# Patient Record
Sex: Male | Born: 1950 | Race: White | Hispanic: No | Marital: Married | Smoking: Never smoker
Health system: Southern US, Community
[De-identification: ages and names within clinical notes are randomized; demographics above are authoritative.]

## PROBLEM LIST (undated history)

## (undated) DIAGNOSIS — H8190 Unspecified disorder of vestibular function, unspecified ear: Secondary | ICD-10-CM

## (undated) DIAGNOSIS — K219 Gastro-esophageal reflux disease without esophagitis: Secondary | ICD-10-CM

## (undated) DIAGNOSIS — R768 Other specified abnormal immunological findings in serum: Secondary | ICD-10-CM

## (undated) DIAGNOSIS — Z952 Presence of prosthetic heart valve: Secondary | ICD-10-CM

## (undated) DIAGNOSIS — G44009 Cluster headache syndrome, unspecified, not intractable: Secondary | ICD-10-CM

## (undated) DIAGNOSIS — R011 Cardiac murmur, unspecified: Secondary | ICD-10-CM

## (undated) DIAGNOSIS — K760 Fatty (change of) liver, not elsewhere classified: Secondary | ICD-10-CM

## (undated) DIAGNOSIS — R42 Dizziness and giddiness: Secondary | ICD-10-CM

## (undated) DIAGNOSIS — M2550 Pain in unspecified joint: Secondary | ICD-10-CM

## (undated) DIAGNOSIS — D649 Anemia, unspecified: Secondary | ICD-10-CM

## (undated) DIAGNOSIS — E785 Hyperlipidemia, unspecified: Secondary | ICD-10-CM

## (undated) DIAGNOSIS — I2510 Atherosclerotic heart disease of native coronary artery without angina pectoris: Secondary | ICD-10-CM

## (undated) DIAGNOSIS — G459 Transient cerebral ischemic attack, unspecified: Secondary | ICD-10-CM

## (undated) DIAGNOSIS — C439 Malignant melanoma of skin, unspecified: Secondary | ICD-10-CM

## (undated) DIAGNOSIS — I447 Left bundle-branch block, unspecified: Secondary | ICD-10-CM

## (undated) DIAGNOSIS — D229 Melanocytic nevi, unspecified: Secondary | ICD-10-CM

## (undated) DIAGNOSIS — Z7901 Long term (current) use of anticoagulants: Secondary | ICD-10-CM

## (undated) DIAGNOSIS — C4491 Basal cell carcinoma of skin, unspecified: Secondary | ICD-10-CM

## (undated) DIAGNOSIS — I639 Cerebral infarction, unspecified: Secondary | ICD-10-CM

## (undated) DIAGNOSIS — M1990 Unspecified osteoarthritis, unspecified site: Secondary | ICD-10-CM

## (undated) DIAGNOSIS — I6523 Occlusion and stenosis of bilateral carotid arteries: Secondary | ICD-10-CM

## (undated) DIAGNOSIS — Q211 Atrial septal defect: Secondary | ICD-10-CM

## (undated) DIAGNOSIS — K5790 Diverticulosis of intestine, part unspecified, without perforation or abscess without bleeding: Secondary | ICD-10-CM

## (undated) DIAGNOSIS — M109 Gout, unspecified: Secondary | ICD-10-CM

## (undated) HISTORY — DX: Diverticulosis of intestine, part unspecified, without perforation or abscess without bleeding: K57.90

## (undated) HISTORY — DX: Presence of prosthetic heart valve: Z95.2

## (undated) HISTORY — DX: Gout, unspecified: M10.9

## (undated) HISTORY — DX: Melanocytic nevi, unspecified: D22.9

## (undated) HISTORY — PX: CARDIAC VALVE REPLACEMENT: SHX585

## (undated) HISTORY — DX: Gastro-esophageal reflux disease without esophagitis: K21.9

## (undated) HISTORY — DX: Hyperlipidemia, unspecified: E78.5

## (undated) HISTORY — PX: MELANOMA EXCISION: SHX5266

## (undated) HISTORY — PX: DENTAL SURGERY: SHX609

## (undated) HISTORY — DX: Pain in unspecified joint: M25.50

## (undated) HISTORY — PX: APPENDECTOMY: SHX54

## (undated) HISTORY — DX: Transient cerebral ischemic attack, unspecified: G45.9

## (undated) HISTORY — PX: CARDIAC CATHETERIZATION: SHX172

## (undated) HISTORY — DX: Other specified abnormal immunological findings in serum: R76.8

## (undated) HISTORY — DX: Atherosclerotic heart disease of native coronary artery without angina pectoris: I25.10

## (undated) HISTORY — DX: Cluster headache syndrome, unspecified, not intractable: G44.009

## (undated) HISTORY — DX: Dizziness and giddiness: R42

## (undated) HISTORY — DX: Occlusion and stenosis of bilateral carotid arteries: I65.23

## (undated) HISTORY — DX: Basal cell carcinoma of skin, unspecified: C44.91

## (undated) HISTORY — DX: Unspecified disorder of vestibular function, unspecified ear: H81.90

## (undated) HISTORY — DX: Atrial septal defect: Q21.1

## (undated) HISTORY — DX: Long term (current) use of anticoagulants: Z79.01

## (undated) HISTORY — DX: Cerebral infarction, unspecified: I63.9

## (undated) HISTORY — DX: Fatty (change of) liver, not elsewhere classified: K76.0

## (undated) HISTORY — DX: Malignant melanoma of skin, unspecified: C43.9

## (undated) HISTORY — PX: CHOLECYSTECTOMY: SHX55

## (undated) HISTORY — DX: Left bundle-branch block, unspecified: I44.7

---

## 2010-10-13 ENCOUNTER — Other Ambulatory Visit: Payer: Self-pay | Admitting: Family Medicine

## 2010-10-13 DIAGNOSIS — H539 Unspecified visual disturbance: Secondary | ICD-10-CM

## 2010-10-14 ENCOUNTER — Ambulatory Visit
Admission: RE | Admit: 2010-10-14 | Discharge: 2010-10-14 | Disposition: A | Discharge status: Home / Self Care | Payer: BC Managed Care – PPO | Source: Ambulatory Visit | Attending: Family Medicine | Admitting: Family Medicine

## 2010-10-14 DIAGNOSIS — H539 Unspecified visual disturbance: Secondary | ICD-10-CM

## 2010-10-14 MED ORDER — GADOBENATE DIMEGLUMINE 529 MG/ML IV SOLN
20.0000 mL | Freq: Once | INTRAVENOUS | Status: AC | PRN
  Administered 2010-10-14: 20 mL via INTRAVENOUS

## 2011-02-09 ENCOUNTER — Encounter (INDEPENDENT_AMBULATORY_CARE_PROVIDER_SITE_OTHER): Payer: Self-pay

## 2011-02-10 ENCOUNTER — Encounter (INDEPENDENT_AMBULATORY_CARE_PROVIDER_SITE_OTHER): Payer: Self-pay | Admitting: General Surgery

## 2011-02-10 ENCOUNTER — Ambulatory Visit (INDEPENDENT_AMBULATORY_CARE_PROVIDER_SITE_OTHER): Payer: BC Managed Care – PPO | Admitting: General Surgery

## 2011-02-10 DIAGNOSIS — C4370 Malignant melanoma of unspecified lower limb, including hip: Secondary | ICD-10-CM

## 2011-02-10 DIAGNOSIS — D0371 Melanoma in situ of right lower limb, including hip: Secondary | ICD-10-CM | POA: Insufficient documentation

## 2011-02-10 NOTE — Progress Notes (Signed)
Chief Complaint  Patient presents with  . Other    new pt- eval level 4 melanoma on rt thigh    HPI Tracy Marshall. is a 60 y.o. male.    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 The patient had this removed approximately one week ago. The lesion is from the right anterior lateral thigh with the diagnosis being malignant melanoma at least Clark's level IV Breslow measurement a 2 mm.  The patient at the time of this a biopsy also had 3 other lesions removed from his left anterior abdominal wall is right scapular area in his right medial thigh all of these are dysplastic nevi with a melanotic atypia. He has had previous melanoma removed from back in 2002 from his stomach wall at the time of a previous cholecystectomy. Also from his left shoulder area about one year ago. This was done while he was seen in the kidney to his mother also has a history of melanoma and recently diagnosed ovarian cancer.  The patient comes in now for evaluation for surgical treatment of melanoma on the right side  Past Medical History  Diagnosis Date  . Melanoma   . Hyperlipidemia   . TIA (transient ischemic attack)   . Atypical moles   . Dizziness   . Cluster headaches   . Joint pain     Past Surgical History  Procedure Date  . Cardiac valve replacement   . Cholecystectomy   . Appendectomy   . Melanoma excision     x3    Family History  Problem Relation Age of Onset  . Melanoma    . Psoriasis    . Cancer Mother     melanoma, ovarian  . Cancer Father     prostate    Social History History  Substance Use Topics  . Smoking status: Never Smoker   . Smokeless tobacco: Not on file  . Alcohol Use: Yes    No Known Allergies  Current Outpatient Prescriptions  Medication Sig Dispense Refill  . Multiple Vitamin (MULTIVITAMIN) capsule Take 1 capsule by mouth daily.        . pantoprazole (PROTONIX) 40 MG tablet daily.      [name] warfarin (COUMADIN) 5 MG  tablet daily.        Review of Systems Review of Systems  Constitutional: Negative.   HENT: Negative.   Respiratory: Negative.   Cardiovascular: Negative.   Skin: Negative.     There were no vitals taken for this visit.  Physical Exam Physical Exam  Constitutional: He appears well-developed and well-nourished.  HENT:  Head: Normocephalic and atraumatic.  Eyes: Pupils are equal, round, and reactive to light.  Cardiovascular: Normal rate, regular rhythm, S1 normal and S2 normal.        Mechanical valve click  Respiratory: Effort normal and breath sounds normal.  Skin: Skin is warm and dry. Lesion (Right anterolateral burn mark from excised melanoma) noted.     Psychiatric: He has a normal mood and affect. His behavior is normal. Judgment and thought content normal.     Data Reviewed I have reviewed the pathology report from the dermatology specialists. She demonstrates a malignant melanoma of the right anterior lateral thigh 0.82 mm in depth Clark's level IV  Assessment    Malignant melanoma Clark's level 422 mm in depth is right at the borderline of requiring a sentinel lymph node biopsy.    Plan  The patient is to have cardiovascular followup and clearance for surgery. 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  After cardiology clearance we will need to go ahead and schedule him for wide excision of the previously excised melanoma site along with a sentinel lymph node biopsy so he'll need to have radionuclide mapping along with the dye mapping and at the time of surgery to       Tracy Marshall III,Tracy Marshall O 02/10/2011, 11:35 AM

## 2011-02-10 NOTE — Patient Instructions (Signed)
Will discuss surgery again at next visit along with plans for anticoagulation coverage.

## 2011-02-19 ENCOUNTER — Ambulatory Visit (INDEPENDENT_AMBULATORY_CARE_PROVIDER_SITE_OTHER): Payer: BC Managed Care – PPO | Admitting: General Surgery

## 2011-02-19 DIAGNOSIS — C4370 Malignant melanoma of unspecified lower limb, including hip: Secondary | ICD-10-CM

## 2011-02-19 NOTE — Progress Notes (Signed)
HPI The patient comes in after having a cardiac assessment preoperatively for surgery. A stress test was performed which he passed well plans was Coumadin therapy is to stop and to bridge him with Lovenox.  The patient has a second lesion on the lateral aspect of his right thigh similar to the one that was biopsied demonstrating melanoma that he has identified  PE Patient has a 1 cm previously excised melanoma site in the anterolateral aspect of the of the upper right thigh. Slightly superior and lateral to the excised lesion is a 1 cm firm lightly pigmented lesion similar to the one that was previously excised.  Studiy review The patient had a recent office visit evaluation by Cristopher Peru nurse practitioner for preoperative evaluation for cardiac stability. A pharmacologic stress test has been done which apparently the patient is passed without problem. Plans are to bridge this Coumadin.  Assessment Previously excised melanoma of the anterolateral right thigh. A second pigmented lesion in the superior lateral to the prior lesion and will be excised at the same time as surgery.  Plan Plan for surgery as described above.

## 2011-02-20 ENCOUNTER — Other Ambulatory Visit (INDEPENDENT_AMBULATORY_CARE_PROVIDER_SITE_OTHER): Payer: Self-pay | Admitting: General Surgery

## 2011-02-20 DIAGNOSIS — C439 Malignant melanoma of skin, unspecified: Secondary | ICD-10-CM

## 2011-03-18 ENCOUNTER — Other Ambulatory Visit (HOSPITAL_COMMUNITY): Payer: BC Managed Care – PPO

## 2011-03-23 ENCOUNTER — Other Ambulatory Visit (HOSPITAL_COMMUNITY): Payer: BC Managed Care – PPO

## 2011-03-26 ENCOUNTER — Other Ambulatory Visit (INDEPENDENT_AMBULATORY_CARE_PROVIDER_SITE_OTHER): Payer: Self-pay | Admitting: General Surgery

## 2011-03-26 ENCOUNTER — Encounter (HOSPITAL_COMMUNITY)
Admission: RE | Admit: 2011-03-26 | Discharge: 2011-03-26 | Disposition: A | Discharge status: Home / Self Care | Payer: BC Managed Care – PPO | Source: Ambulatory Visit | Attending: General Surgery | Admitting: General Surgery

## 2011-03-26 DIAGNOSIS — C4370 Malignant melanoma of unspecified lower limb, including hip: Secondary | ICD-10-CM

## 2011-03-26 LAB — CBC
MCV: 86.3 fL (ref 78.0–100.0)
Platelets: 314 10*3/uL (ref 150–400)
RDW: 12.9 % (ref 11.5–15.5)
WBC: 6.5 10*3/uL (ref 4.0–10.5)

## 2011-03-26 LAB — COMPREHENSIVE METABOLIC PANEL
CO2: 27 mEq/L (ref 19–32)
Calcium: 9.8 mg/dL (ref 8.4–10.5)
Creatinine, Ser: 0.97 mg/dL (ref 0.50–1.35)
GFR calc Af Amer: >90 mL/min (ref >90)
GFR calc non Af Amer: 89 mL/min — ABNORMAL LOW (ref >90)
Glucose, Bld: 109 mg/dL — ABNORMAL HIGH (ref 70–99)

## 2011-03-26 LAB — DIFFERENTIAL
Eosinophils Absolute: 0.2 10*3/uL (ref 0.0–0.7)
Eosinophils Relative: 2 % (ref 0–5)
Lymphs Abs: 2.5 10*3/uL (ref 0.7–4.0)

## 2011-03-26 LAB — SURGICAL PCR SCREEN
MRSA, PCR: NEGATIVE
Staphylococcus aureus: NEGATIVE

## 2011-03-26 LAB — APTT: aPTT: 33 seconds (ref 24–37)

## 2011-04-02 ENCOUNTER — Ambulatory Visit (HOSPITAL_COMMUNITY)
Admission: RE | Admit: 2011-04-02 | Discharge: 2011-04-02 | Disposition: A | Discharge status: Home / Self Care | Payer: BC Managed Care – PPO | Source: Ambulatory Visit | Attending: General Surgery | Admitting: General Surgery

## 2011-04-02 ENCOUNTER — Other Ambulatory Visit (INDEPENDENT_AMBULATORY_CARE_PROVIDER_SITE_OTHER): Payer: Self-pay | Admitting: General Surgery

## 2011-04-02 ENCOUNTER — Ambulatory Visit (HOSPITAL_COMMUNITY)
Admission: RE | Admit: 2011-04-02 | Discharge: 2011-04-03 | Disposition: A | Discharge status: Home / Self Care | Payer: BC Managed Care – PPO | Source: Ambulatory Visit | Attending: General Surgery | Admitting: General Surgery

## 2011-04-02 DIAGNOSIS — C439 Malignant melanoma of skin, unspecified: Secondary | ICD-10-CM

## 2011-04-02 DIAGNOSIS — C4370 Malignant melanoma of unspecified lower limb, including hip: Secondary | ICD-10-CM

## 2011-04-02 DIAGNOSIS — Z01812 Encounter for preprocedural laboratory examination: Secondary | ICD-10-CM | POA: Insufficient documentation

## 2011-04-02 DIAGNOSIS — D239 Other benign neoplasm of skin, unspecified: Secondary | ICD-10-CM

## 2011-04-02 DIAGNOSIS — Z01818 Encounter for other preprocedural examination: Secondary | ICD-10-CM | POA: Insufficient documentation

## 2011-04-02 DIAGNOSIS — Z954 Presence of other heart-valve replacement: Secondary | ICD-10-CM | POA: Insufficient documentation

## 2011-04-02 DIAGNOSIS — D2370 Other benign neoplasm of skin of unspecified lower limb, including hip: Secondary | ICD-10-CM | POA: Insufficient documentation

## 2011-04-02 DIAGNOSIS — Z8673 Personal history of transient ischemic attack (TIA), and cerebral infarction without residual deficits: Secondary | ICD-10-CM | POA: Insufficient documentation

## 2011-04-02 LAB — PROTIME-INR: Prothrombin Time: 15.3 seconds — ABNORMAL HIGH (ref 11.6–15.2)

## 2011-04-02 MED ORDER — TECHNETIUM TC 99M SULFUR COLLOID FILTERED
0.5000 | Freq: Once | INTRAVENOUS | Status: AC | PRN
  Administered 2011-04-02: 0.5 via INTRADERMAL

## 2011-04-07 ENCOUNTER — Telehealth (INDEPENDENT_AMBULATORY_CARE_PROVIDER_SITE_OTHER): Payer: Self-pay

## 2011-04-07 NOTE — Telephone Encounter (Signed)
Patient called for his path results.  I notified him that the results were negative margins, no residual melanoma, and negative lymphnode.  He asked for any further instructions.  I told him that Marcelino Duster would talk to Dr Lindie Spruce Thursday and if there is anything else he needs to know they will call.

## 2011-04-07 NOTE — Op Note (Signed)
Tracy Marshall, GUMZ NO.:  [id number]  MEDICAL RECORD NO.:  [id number]  LOCATION:  [facility]                         FACILITY:  [facility]  PHYSICIAN:  Cherylynn Ridges, M.D.    DATE OF BIRTH:  1950/07/09  DATE OF PROCEDURE:  04/02/2011 DATE OF DISCHARGE:                              OPERATIVE REPORT   PREOPERATIVE DIAGNOSIS:  Previously excised melanoma of the right upper lateral thigh with a second posterior and lateral pigmented lesion on the right thigh.  POSTOP DIAGNOSIS:  Previously excised melanoma of the right upper lateral thigh with a second posterior and lateral pigmented lesion on the right thigh.  PROCEDURE: 1. Re-excision of previously excised melanoma site. 2. Excision of pigmented lesion of the upper lateral and posterior     thigh. 3. Sentinel lymph node biopsy with methylene blue injection and     localization with using a Neoprobe.  SURGEON:  Cherylynn Ridges, MD  ANESTHESIA:  General with laryngeal airway.  ESTIMATED BLOOD LOSS:  Less than 20 mL.  COMPLICATIONS:  None.  CONDITION:  Stable.  FINDINGS:  The patient had 1 pigmented and hot sentinel lymph node which was removed.  INDICATION FOR OPERATION:  The patient had a previously excised Clarks level IV melanoma which is now being followed up with re-excision and sentinel lymph node biopsy.  OPERATION:  The patient was taken to the operating room, placed on table in supine position.  After an adequate general laryngeal airway anesthetic was administered, he was prepped and draped in usual sterile manner exposing his right thigh.  We marked the area of excision of the more anterior but lateral upper right thigh lesion.  It had been previously injected with radionuclide. We injected it with 5 mL of methylene blue.  We then marked the margins 2 cm sort of superolaterally and inferolaterally and giving [place] at least 1 cm margin circumferentially.  We used a 15 blade to make the incision, and  subsequently excised using electrocautery down through and deep into the subcutaneous tissue.  We obtained hemostasis in that excised area using electrocautery.  We then excised the pigmented lesion with at least 1 cm margin circumferentially using a 15 blade and electrocautery.  Both these were left opened and subsequently injected with Exparel  for postop pain control.  A total of 27 mL were use of the diluted solution up to 30 mL. We closed all incisions sequentially at the end.  However, after we excised these 2 areas, we went ahead and used the Neoprobe in order to localize the sentinel lymph node in the right groin.  A single area of high activity was noted.  We made an incision approximately 3 cm on top of that and then dissected down to the subcutaneous tissue where a pigmented from the methylene blue and hot radionucleotide Neoprobe node was noted. We used small hemoclips in order to help excise this and subsequently sent off the field.  It was sent for permanent sections. We closed all layers with a combination of 3-0 or 4-0 Vicryl deep subcutaneous layer and a 3-0 or 4-0 Monocryl in the skin.  Exparel was injected in all sites, a  total of 27 mL were used.  We then used Dermabond, Tegaderm and Steri-Strips to complete the dressing.  All counts were correct.     Cherylynn Ridges, M.D.     JOW/MEDQ  D:  04/02/2011  T:  04/02/2011  Job:  [id number]  Electronically Signed by Jimmye Norman M.D. on 04/07/2011 07:31:02 AM

## 2011-04-08 ENCOUNTER — Telehealth (INDEPENDENT_AMBULATORY_CARE_PROVIDER_SITE_OTHER): Payer: Self-pay

## 2011-04-08 NOTE — Telephone Encounter (Signed)
I notified the pt that Dr Lindie Spruce says he can restart the Coumadin and stop Lovenox.  He will coordinate this with his medical md.

## 2011-04-14 ENCOUNTER — Telehealth (INDEPENDENT_AMBULATORY_CARE_PROVIDER_SITE_OTHER): Payer: Self-pay

## 2011-04-14 NOTE — Telephone Encounter (Signed)
Pt called stating small amount of watery blood drainage from thigh wd. No redness. No fever. Not hot to touch. Wound closed without opening. Pt to cover with dry dsg. Pt advised this may be small seroma under wd and to keep area clean and covered with dsg. Pt to call for sooner ov if area becomes red,feverish,swollen or if drainage appears infected. Pt to keep appt 11-6.

## 2011-04-28 ENCOUNTER — Encounter (INDEPENDENT_AMBULATORY_CARE_PROVIDER_SITE_OTHER): Payer: Self-pay | Admitting: General Surgery

## 2011-04-28 ENCOUNTER — Ambulatory Visit (INDEPENDENT_AMBULATORY_CARE_PROVIDER_SITE_OTHER): Payer: BC Managed Care – PPO | Admitting: General Surgery

## 2011-04-28 VITALS — BP 124/86 | HR 64 | Temp 97.6°F | Resp 12 | Ht 70.0 in | Wt 195.6 lb

## 2011-04-28 DIAGNOSIS — Z09 Encounter for follow-up examination after completed treatment for conditions other than malignant neoplasm: Secondary | ICD-10-CM

## 2011-04-28 NOTE — Progress Notes (Signed)
HPI The patient is doing well status post reexcision of melanoma site in the right upper lateral 5 and a second lesion being removed from his thigh on the right side. He also had a sentinel lymph node biopsy x2 from his right inguinal area both of which were negative for melanoma.  PE On examination at the primary excision site the patient has some significant induration and some brawny skin changes which I would like to see again in a month to make sure that it is improving.  At the secondary lesion excision site the patient has had some serous leakage but currently it does not look infected and does not require any further treatment.  Studiy review I have read reviewed the patient's pathology report showed negative margins and also negative disease in the lymph node  Assessment Edema and some brawny changes in the primary excision site no evidence of lymphedema in the thigh.  Plan I will see the patient again in one month to reassess the changes at the primary excision site but otherwise I think he is doing well and no plans for antibiotics or other treatments are needed at this time.

## 2011-06-09 ENCOUNTER — Ambulatory Visit (INDEPENDENT_AMBULATORY_CARE_PROVIDER_SITE_OTHER): Payer: BC Managed Care – PPO | Admitting: General Surgery

## 2011-06-09 ENCOUNTER — Encounter (INDEPENDENT_AMBULATORY_CARE_PROVIDER_SITE_OTHER): Payer: Self-pay | Admitting: General Surgery

## 2011-06-09 VITALS — BP 134/96 | HR 64 | Temp 97.9°F | Resp 16 | Ht 70.0 in | Wt 204.4 lb

## 2011-06-09 DIAGNOSIS — Z09 Encounter for follow-up examination after completed treatment for conditions other than malignant neoplasm: Secondary | ICD-10-CM

## 2011-06-09 NOTE — Progress Notes (Signed)
HPI The patient is status post wide excision of this melanoma with a left inguinal lymph node biopsy. He is doing much better. He is still complaining of some medial thigh discomfort but nothing severe and it does appear to be improving.  PE  His wounds are without evidence of infection.  Studiy review None  Assessment Doing well postoperatively  Plan Return to see me on a p.r.n. basis

## 2011-08-11 ENCOUNTER — Other Ambulatory Visit: Payer: Self-pay | Admitting: Dermatology

## 2013-06-01 ENCOUNTER — Encounter: Payer: Self-pay | Admitting: General Surgery

## 2013-06-01 DIAGNOSIS — R0789 Other chest pain: Secondary | ICD-10-CM

## 2013-06-01 DIAGNOSIS — I359 Nonrheumatic aortic valve disorder, unspecified: Secondary | ICD-10-CM | POA: Insufficient documentation

## 2013-06-01 DIAGNOSIS — Z7901 Long term (current) use of anticoagulants: Secondary | ICD-10-CM

## 2013-06-02 ENCOUNTER — Encounter: Payer: Self-pay | Admitting: Cardiology

## 2013-06-02 ENCOUNTER — Ambulatory Visit (INDEPENDENT_AMBULATORY_CARE_PROVIDER_SITE_OTHER): Payer: 59 | Admitting: Cardiology

## 2013-06-02 VITALS — BP 130/91 | HR 80 | Ht 69.5 in | Wt 202.0 lb

## 2013-06-02 DIAGNOSIS — Z954 Presence of other heart-valve replacement: Secondary | ICD-10-CM

## 2013-06-02 DIAGNOSIS — I359 Nonrheumatic aortic valve disorder, unspecified: Secondary | ICD-10-CM

## 2013-06-02 DIAGNOSIS — Z7901 Long term (current) use of anticoagulants: Secondary | ICD-10-CM

## 2013-06-02 DIAGNOSIS — Z952 Presence of prosthetic heart valve: Secondary | ICD-10-CM

## 2013-06-02 DIAGNOSIS — I447 Left bundle-branch block, unspecified: Secondary | ICD-10-CM | POA: Insufficient documentation

## 2013-06-02 NOTE — Progress Notes (Signed)
[address] [place], [place]  [address] Phone: [phone] Fax:  [phone]  Date:  06/02/2013   ID:  Tracy App., DOB 02-19-1951, MRN [id number]  PCP:  Tracy Rua, MD  Cardiologist:  Armanda Magic, MD     History of Present Illness: Tracy Marshall. is a 62 y.o. male with a history of mechanical AVR remotely in 2001 who saw me in April 2014 with complaints of sharp CP.  He was under a lot of stress with his wife having breast CA.  A nuclear stress test was done which was low risk and showed a small apical defect.  He was continued on medical therapy and presents back for followup today.  He is doing well.  He has not had any further chest pain or SOB.  He denies any palpitations, LE edema, dizziness or syncope.   Wt Readings from Last 3 Encounters:  06/01/13 199 lb 12.8 oz (90.629 kg)  06/09/11 204 lb 6 oz (92.704 kg)  04/28/11 195 lb 9.6 oz (88.724 kg)     Past Medical History  Diagnosis Date  . Hyperlipidemia   . TIA (transient ischemic attack)   . Atypical moles     melanomna  . Dizziness   . Cluster headaches   . Joint pain   . Gout   . Melanoma     hx of melanoma and multiple basal cell carcinomas Dr Danella Deis  . GERD (gastroesophageal reflux disease)   . Diverticulosis   . Fatty liver     hx of elevated hepatic transaminases-negative workup in 2009 except for U/S suggesting fatty liver  . Episodic recurrent vertigo 2002 & 2005    carotid dopplers w no clinically significant stenosis  . Positive ANA (antinuclear antibody)   . Basal cell carcinoma   . LBBB (left bundle branch block)   . S/P AVR (aortic valve replacement)     mechanical  . Chronic anticoagulation     for mechanical AVR    Current Outpatient Prescriptions  Medication Sig Dispense Refill  . colchicine 0.6 MG tablet Take 0.6 mg by mouth as needed.      . meclizine (ANTIVERT) 25 MG tablet Take 25 mg by mouth as needed for dizziness.      . Multiple Vitamin (MULTIVITAMIN)  capsule Take 1 capsule by mouth daily.        . pantoprazole (PROTONIX) 40 MG tablet daily.      [name] warfarin (COUMADIN) 5 MG tablet daily.       No current facility-administered medications for this visit.    Allergies:   No Known Allergies  Social History:  The patient  reports that he has never smoked. He has never used smokeless tobacco. He reports that he drinks alcohol. He reports that he does not use illicit drugs.   Family History:  The patient's family history includes Cancer in his father and mother; Melanoma in an other family member; Psoriasis in an other family member.   ROS:  Please see the history of present illness.      All other systems reviewed and negative.   PHYSICAL EXAM: VS:  There were no vitals taken for this visit. Well nourished, well developed, in no acute distress HEENT: normal Neck: no JVD Cardiac:  normal S1, S2; RRR; no murmur Lungs:  clear to auscultation bilaterally, no wheezing, rhonchi or rales Abd: soft, nontender, no hepatomegaly Ext: no edema Skin: warm and dry Neuro:  CNs 2-12 intact,  no focal abnormalities noted  EKG:       ASSESSMENT AND PLAN:  1. Mechanical AVR 2. Chronic systemic anticoagulation  - followed by his PCP 3.  Atypical CP resolved with low risk nuclear stress test 4.  Chronic LBBB  Followup with me in 1 year  Signed, Armanda Magic, MD 06/02/2013 11:15 AM

## 2013-06-02 NOTE — Patient Instructions (Signed)
Your physician recommends that you continue on your current medications as directed. Please refer to the Current Medication list given to you today.  Your physician wants you to follow-up in: 1 Year with Dr Turner You will receive a reminder letter in the mail two months in advance. If you don't receive a letter, please call our office to schedule the follow-up appointment.  

## 2014-03-29 ENCOUNTER — Encounter: Payer: Self-pay | Admitting: Cardiology

## 2014-06-05 ENCOUNTER — Ambulatory Visit (INDEPENDENT_AMBULATORY_CARE_PROVIDER_SITE_OTHER): Payer: 59 | Admitting: Cardiology

## 2014-06-05 ENCOUNTER — Encounter: Payer: Self-pay | Admitting: Cardiology

## 2014-06-05 VITALS — BP 122/84 | HR 58 | Ht 70.0 in | Wt 199.0 lb

## 2014-06-05 DIAGNOSIS — Z952 Presence of prosthetic heart valve: Secondary | ICD-10-CM

## 2014-06-05 DIAGNOSIS — I359 Nonrheumatic aortic valve disorder, unspecified: Secondary | ICD-10-CM

## 2014-06-05 DIAGNOSIS — R29810 Facial weakness: Secondary | ICD-10-CM

## 2014-06-05 DIAGNOSIS — I447 Left bundle-branch block, unspecified: Secondary | ICD-10-CM

## 2014-06-05 DIAGNOSIS — Z954 Presence of other heart-valve replacement: Secondary | ICD-10-CM

## 2014-06-05 DIAGNOSIS — R0789 Other chest pain: Secondary | ICD-10-CM

## 2014-06-05 NOTE — Patient Instructions (Addendum)
Your physician has requested that you have an echocardiogram. Echocardiography is a painless test that uses sound waves to create images of your heart. It provides your doctor with information about the size and shape of your heart and how well your heart's chambers and valves are working. This procedure takes approximately one hour. There are no restrictions for this procedure.  You have an appointment with a doctor at your primary care TODAY.  Your physician wants you to follow-up in: one year with Dr. Radford Pax. You will receive a reminder letter in the mail two months in advance. If you don't receive a letter, please call our office to schedule the follow-up appointment.

## 2014-06-05 NOTE — Progress Notes (Signed)
[place] [place], [place]  [address] Phone: [phone] Fax:  [phone]  Date:  06/05/2014   ID:  Tracy Marshall., DOB 1950-12-31, MRN [id number]  PCP:  Orpah Melter, MD  Cardiologist:  Fransico Him, MD    History of Present Illness: Tracy Marshall. is a 63 y.o. male with a history of mechanical AVR remotely in 2001 who saw me in April 2014 with complaints of sharp CP. He was under a lot of stress with his wife having breast CA. A nuclear stress test was done which was low risk and showed a small apical defect. He was continued on medical therapy and presents back for followup today. He is doing well. He has not had any further chest pain or SOB except some mild DOE on occasion with exertion but it does not limit his activity.  He denies any palpitations, LE edema, dizziness or syncope.   Wt Readings from Last 3 Encounters:  06/05/14 199 lb (90.266 kg)  06/02/13 202 lb (91.627 kg)  06/01/13 199 lb 12.8 oz (90.629 kg)     Past Medical History  Diagnosis Date  . Hyperlipidemia   . TIA (transient ischemic attack)   . Atypical moles     melanomna  . Dizziness   . Cluster headaches   . Joint pain   . Gout   . Melanoma     hx of melanoma and multiple basal cell carcinomas Dr Tonia Brooms  . GERD (gastroesophageal reflux disease)   . Diverticulosis   . Fatty liver     hx of elevated hepatic transaminases-negative workup in 2009 except for U/S suggesting fatty liver  . Episodic recurrent vertigo 2002 & 2005    carotid dopplers w no clinically significant stenosis  . Positive ANA (antinuclear antibody)   . Basal cell carcinoma   . LBBB (left bundle branch block)   . S/P AVR (aortic valve replacement)     mechanical  . Chronic anticoagulation     for mechanical AVR    Current Outpatient Prescriptions  Medication Sig Dispense Refill  . amoxicillin (AMOXIL) 875 MG tablet Take 875 mg by mouth 2 (two) times daily.   0  . colchicine 0.6 MG tablet Take  0.6 mg by mouth as needed.     . meclizine (ANTIVERT) 25 MG tablet Take 25 mg by mouth as needed for dizziness.    . Multiple Vitamin (MULTIVITAMIN) capsule Take 1 capsule by mouth daily.      . pantoprazole (PROTONIX) 40 MG tablet daily.    [name] warfarin (COUMADIN) 5 MG tablet daily.     No current facility-administered medications for this visit.    Allergies:   No Known Allergies  Social History:  The patient  reports that he has never smoked. He has never used smokeless tobacco. He reports that he drinks alcohol. He reports that he does not use illicit drugs.   Family History:  The patient's family history includes Cancer in his father and mother; Melanoma in an other family member; Psoriasis in an other family member.   ROS:  Please see the history of present illness.      All other systems reviewed and negative.   PHYSICAL EXAM: VS:  BP 122/84 mmHg  Pulse 58  Ht 5\' 10"  (1.778 m)  Wt 199 lb (90.266 kg)  BMI 28.55 kg/m2  SpO2 98% Well nourished, well developed, in no acute distress HEENT: normal Neck: no JVD Cardiac:  normal S1,  S2; RRR; 2/6 Sm at RUSB Lungs:  clear to auscultation bilaterally, no wheezing, rhonchi or rales Abd: soft, nontender, no hepatomegaly Ext: no edema Skin: warm and dry Neuro:  Facial droop on left side noted.  No other neurologic deficits     ASSESSMENT AND PLAN:  1.  Mechanical AVR with mild systolic murmur - I will check a 2D echo 2.  Chronic systemic anticoagulation - followed by his PCP 3. Atypical CP resolved with low risk nuclear stress test 4. Chronic LBBB 5.  Drooping of left side of face with no neurological symptoms.  He has has a sinus infection and is on antibiotics - ? Bell's palsy - I will get him in to see his PCP today  Followup with me in 1 year  Signed, Fransico Him, MD [facility] 06/05/2014 2:18 PM

## 2014-06-08 ENCOUNTER — Ambulatory Visit (HOSPITAL_COMMUNITY): Payer: 59 | Attending: Cardiology | Admitting: Cardiology

## 2014-06-08 DIAGNOSIS — R011 Cardiac murmur, unspecified: Secondary | ICD-10-CM | POA: Insufficient documentation

## 2014-06-08 DIAGNOSIS — Z954 Presence of other heart-valve replacement: Secondary | ICD-10-CM

## 2014-06-08 DIAGNOSIS — I359 Nonrheumatic aortic valve disorder, unspecified: Secondary | ICD-10-CM

## 2014-06-08 NOTE — Progress Notes (Signed)
Echo performed. 

## 2014-06-13 ENCOUNTER — Telehealth: Payer: Self-pay

## 2014-06-13 DIAGNOSIS — I519 Heart disease, unspecified: Secondary | ICD-10-CM

## 2014-06-13 NOTE — Telephone Encounter (Signed)
Patient informed of results and verbal understanding expressed.  Patient agrees to MUGA scan. Ordered for scheduling.

## 2014-06-20 ENCOUNTER — Ambulatory Visit (HOSPITAL_COMMUNITY): Payer: 59 | Attending: Cardiology | Admitting: Radiology

## 2014-06-20 DIAGNOSIS — I519 Heart disease, unspecified: Secondary | ICD-10-CM

## 2014-06-20 DIAGNOSIS — I5181 Takotsubo syndrome: Secondary | ICD-10-CM | POA: Insufficient documentation

## 2014-06-20 NOTE — Progress Notes (Signed)
Muga Study  Referring Provider:  Sueanne Margarita, MD  Date of Procedure: 06/20/2014  Indication: Assess LVF 06-18-2014 Echo: EF 45-50% History of AVR, LBBB, and 2014 Myocardial Perfusion Imaging-small Scar  IV 22G (R) hand x 1, tolerated well by Irven Baltimore, RN.   Muga Information:  The patient's red blood cells were labeled using the Ultra Tag method with 33.0 mci of Technetium 65m Pertechnetate.  The images were reconstructed in the Anterior, Lateral and Left Anterior oblique views.  Impression: Overall EF calculated at 45% with ED volume 116cc and ES volume 64cc.    Signed: Fransico Him, MD [facility] 06/21/2014

## 2014-06-21 MED ORDER — TECHNETIUM TC 99M-LABELED RED BLOOD CELLS IV KIT
33.0000 | PACK | Freq: Once | INTRAVENOUS | Status: AC | PRN
  Administered 2014-06-20: 33 via INTRAVENOUS

## 2014-06-25 ENCOUNTER — Encounter: Payer: Self-pay | Admitting: Cardiology

## 2014-07-18 ENCOUNTER — Other Ambulatory Visit (HOSPITAL_BASED_OUTPATIENT_CLINIC_OR_DEPARTMENT_OTHER): Payer: Self-pay | Admitting: Family Medicine

## 2014-07-18 DIAGNOSIS — G510 Bell's palsy: Secondary | ICD-10-CM

## 2014-07-21 ENCOUNTER — Ambulatory Visit (HOSPITAL_BASED_OUTPATIENT_CLINIC_OR_DEPARTMENT_OTHER)
Admission: RE | Admit: 2014-07-21 | Discharge: 2014-07-21 | Disposition: A | Discharge status: Home / Self Care | Payer: 59 | Source: Ambulatory Visit | Attending: Family Medicine | Admitting: Family Medicine

## 2014-07-21 DIAGNOSIS — R51 Headache: Secondary | ICD-10-CM | POA: Diagnosis present

## 2014-07-21 DIAGNOSIS — I638 Other cerebral infarction: Secondary | ICD-10-CM | POA: Diagnosis not present

## 2014-07-21 DIAGNOSIS — R531 Weakness: Secondary | ICD-10-CM | POA: Insufficient documentation

## 2014-07-21 DIAGNOSIS — R29810 Facial weakness: Secondary | ICD-10-CM | POA: Insufficient documentation

## 2014-07-21 DIAGNOSIS — G510 Bell's palsy: Secondary | ICD-10-CM

## 2014-07-21 MED ORDER — GADOBENATE DIMEGLUMINE 529 MG/ML IV SOLN: 18.0000 mL | Freq: Once | INTRAVENOUS | Status: AC | PRN

## 2014-07-23 ENCOUNTER — Telehealth: Payer: Self-pay | Admitting: Cardiology

## 2014-07-23 NOTE — Telephone Encounter (Signed)
Spoke with Dr. Delman Kitten nurse and informed her that Dr. Radford Pax had to leave for an emergency, but asked if there was anything I could do for help. Dr. Delman Kitten nurse took a message and said that if it is urgent she will have Dr. Maceo Pro call our DOD, Dr. Angelena Form.

## 2014-07-23 NOTE — Telephone Encounter (Signed)
Traci, I got a call today from Dr. Maceo Pro about [name]. (I am the office DOD). He continues to have neurological issues. 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 He has been therapeutic on coumadin for his mechanical valve. I think he will probably need carotids and possibly a TEE. I put him into an opening on your schedule tomorrow. Gerald Stabs

## 2014-07-23 NOTE — Telephone Encounter (Signed)
Thanks!    Chris.

## 2014-07-23 NOTE — Telephone Encounter (Signed)
New Msg       Dr. Maceo Pro calling, would need to be contacted this morning ASAP by Dr. Radford Pax, states it is urgent but not extremely severe.    Would prefer to be contacted this AM.    Please contact at [phone].

## 2014-07-24 ENCOUNTER — Encounter: Payer: Self-pay | Admitting: Cardiology

## 2014-07-24 ENCOUNTER — Ambulatory Visit (INDEPENDENT_AMBULATORY_CARE_PROVIDER_SITE_OTHER): Payer: 59 | Admitting: Cardiology

## 2014-07-24 VITALS — BP 126/84 | HR 66 | Ht 70.0 in | Wt 201.0 lb

## 2014-07-24 DIAGNOSIS — Z954 Presence of other heart-valve replacement: Secondary | ICD-10-CM

## 2014-07-24 DIAGNOSIS — I639 Cerebral infarction, unspecified: Secondary | ICD-10-CM

## 2014-07-24 DIAGNOSIS — Z952 Presence of prosthetic heart valve: Secondary | ICD-10-CM

## 2014-07-24 DIAGNOSIS — R079 Chest pain, unspecified: Secondary | ICD-10-CM

## 2014-07-24 DIAGNOSIS — R0789 Other chest pain: Secondary | ICD-10-CM

## 2014-07-24 DIAGNOSIS — I447 Left bundle-branch block, unspecified: Secondary | ICD-10-CM

## 2014-07-24 DIAGNOSIS — I359 Nonrheumatic aortic valve disorder, unspecified: Secondary | ICD-10-CM

## 2014-07-24 HISTORY — DX: Cerebral infarction, unspecified: I63.9

## 2014-07-24 NOTE — Progress Notes (Addendum)
Cardiology Office Note   Date:  07/24/2014   ID:  Tracy Malta., DOB [birth year], MRN [id number]  PCP:  Orpah Melter, MD  Cardiologist:   Sueanne Margarita, MD   No chief complaint on file.     History of Present Illness: Tracy Marshall. is a 64 y.o. male with a history of mechanical AVR remotely in 2001 who saw me in April 2014 with complaints of sharp CP. He was under a lot of stress with his wife having breast CA. A nuclear stress test was done which was low risk and showed a small apical defect. He had an echo done in 05/2014 for followup of his AVF and showed mild LV dysfunction EF 45-50% and a MUGA was done to verify EF which was 45% and echo from 2012 was 52%.[name]  He was continued on medical therapy and presents back for followup last month and was doing well but noticed some drooping of the left side of his face.  He was referred to his PCP.  Dr. Maceo Pro called yesterday stating that patient is still having neurologic issues and an MRI this weekend showed small bilateral CVAs despite being therapeutic on his coumadin.  He would like him to have a TEE and carotid dopplers.  He just came back from [facility] with a episode of sharp pain while out there that was constant and nothing made it worse and it resolved after lying down.  He thinks it was indigestion.  He denied any SOB or nausea but had some mild diaphoresis.  It lasted about 30 minutes and resolved.  He complains of being fatigued.  He also says that he has exertional DOE with minimal movements.    Past Medical History  Diagnosis Date  . Hyperlipidemia   . TIA (transient ischemic attack)   . Atypical moles     melanomna  . Dizziness   . Cluster headaches   . Joint pain   . Gout   . Melanoma     hx of melanoma and multiple basal cell carcinomas Dr Tonia Brooms  . GERD (gastroesophageal reflux disease)   . Diverticulosis   . Fatty liver     hx of elevated hepatic transaminases-negative workup in 2009 except for U/S  suggesting fatty liver  . Episodic recurrent vertigo 2002 & 2005    carotid dopplers w no clinically significant stenosis  . Positive ANA (antinuclear antibody)   . Basal cell carcinoma   . LBBB (left bundle branch block)   . S/P AVR (aortic valve replacement)     mechanical  . Chronic anticoagulation     for mechanical AVR  . CVA (cerebral vascular accident) 07/24/2014     MRI indicating 2 punctate foci of acute infarction in the l    Past Surgical History  Procedure Laterality Date  . Cardiac valve replacement    . Cholecystectomy    . Appendectomy    . Melanoma excision      x3     Current Outpatient Prescriptions  Medication Sig Dispense Refill  . amoxicillin (AMOXIL) 875 MG tablet Take 875 mg by mouth as needed. PRIOR TO PROCEDURES  0  . aspirin 81 MG tablet Take 81 mg by mouth daily.    . colchicine 0.6 MG tablet Take 0.6 mg by mouth as needed.     . meclizine (ANTIVERT) 25 MG tablet Take 25 mg by mouth as needed for dizziness.    . Multiple Vitamin (MULTIVITAMIN) capsule Take 1  capsule by mouth daily.      . pantoprazole (PROTONIX) 40 MG tablet daily.    [name] warfarin (COUMADIN) 5 MG tablet daily. 3 DAYS 5MG  4 DAYS 2.5 MG     No current facility-administered medications for this visit.    Allergies:   Review of patient's allergies indicates no known allergies.    Social History:  The patient  reports that he has never smoked. He has never used smokeless tobacco. He reports that he drinks alcohol. He reports that he does not use illicit drugs.   Family History:  The patient's family history includes Cancer in his father and mother; Melanoma in an other family member; Psoriasis in an other family member.    ROS:  Please see the history of present illness.   Otherwise, review of systems are positive for none.   All other systems are reviewed and negative.    PHYSICAL EXAM: VS:  BP 126/84 mmHg  Pulse 66  Ht 5\' 10"  (1.778 m)  Wt 201 lb (91.173 kg)  BMI 28.84 kg/m2 ,  BMI Body mass index is 28.84 kg/(m^2). GEN: Well nourished, well developed, in no acute distress HEENT: normal Neck: no JVD, carotid bruits, or masses Cardiac: RRR; no murmurs, rubs, or gallops,no edema  Respiratory:  clear to auscultation bilaterally, normal work of breathing GI: soft, nontender, nondistended, + BS MS: no deformity or atrophy Skin: warm and dry, no rash Neuro:  Strength and sensation are intact Psych: euthymic mood, full affect   EKG:  EKG is ordered today. The ekg ordered today demonstrates NSR with LBBB and PAC's   Recent Labs: No results found for requested labs within last 365 days.    Lipid Panel No results found for: CHOL, TRIG, HDL, CHOLHDL, VLDL, LDLCALC, LDLDIRECT    Wt Readings from Last 3 Encounters:  07/24/14 201 lb (91.173 kg)  07/21/14 199 lb (90.266 kg)  06/05/14 199 lb (90.266 kg)     ASSESSMENT AND PLAN:  1. Mechanical AVR with mild systolic murmur -  2D echo showed normally functioning AVR.  Continue warfarin - INR followed by Dr. Olen Pel.  He has not been taking an ASA so I instructed him to start an 81mg  ASA daily.   2. Chronic systemic anticoagulation - followed by his PCP 3. Atypical CP that may be related to GERD.  He has a persistent cough and has a lot of heartburn.  I will get a Stress myoview to rule out ischemia 4. Chronic LBBB 5. Neurological symptoms with MRI indicating 2 punctate foci of acute infarction in the left posterior frontal cortex without hemorrhage or mass effect.Late subacute infarction in the right insula and frontal opercular region.  Old right temporoparietal cortical and subcortical infarction. Infarctions of different age in both hemispheres suggest embolic infarctions, possibly from the heart or ascending aorta or both carotid bifurcation regions.  Will get carotid dopplers to evaluate.  I will also order a TEE to further evaluate for source of CVA such as PFO.   He has not been taking ASA so  I instructed him to go on ASA 81mg  daily.  His INR has been running around 2.7   Current medicines are reviewed at length with the patient today.  The patient does not have concerns regarding medicines.  The following changes have been made:  Added ASA 81mg  daily  Labs/ tests ordered today include: TEE, carotid dopplers     Disposition:   FU with me in 6 months   Signed,  Sueanne Margarita, MD  07/24/2014 10:09 PM    [facility] [place], [place], [place]  [address] Phone: [phone]; Fax: [phone]

## 2014-07-24 NOTE — Patient Instructions (Signed)
Your physician has recommended you make the following change in your medication:   START TAKING ASPIRIN 58 MG ONCE A DAY   Your physician has requested that you have a carotid duplex. This test is an ultrasound of the carotid arteries in your neck. It looks at blood flow through these arteries that supply the brain with blood. Allow one hour for this exam. There are no restrictions or special instructions.  Your physician has requested that you have en exercise stress myoview. For further information please visit [email or web address]. Please follow instruction sheet, as given.  Your physician wants you to follow-up in:  6 MONTHS  You will receive a reminder letter in the mail two months in advance. If you don't receive a letter, please call our office to schedule the follow-up appointment.   Your physician has requested that you have a TEE. ON 07/31/14 @ 10 AM WITH DR TURNER PLEASE ARRIVE AT [place] @ 9 AM  HAVE NOTHING TO DRINK OR EAT AFTER MIDNIGHT AND ONLY YOUR MEDS  THE MORNING OF PROCEDURES    During a TEE, sound waves are used to create images of your heart. It provides your doctor with information about the size and shape of your heart and how well your heart's chambers and valves are working. In this test, a transducer is attached to the end of a flexible tube that's guided down your throat and into your esophagus (the tube leading from you mouth to your stomach) to get a more detailed image of your heart. You are not awake for the procedure. Please see the instruction sheet given to you today. For further information please visit [email or web address].

## 2014-07-25 ENCOUNTER — Other Ambulatory Visit (HOSPITAL_COMMUNITY): Payer: Self-pay | Admitting: Cardiology

## 2014-07-25 DIAGNOSIS — I639 Cerebral infarction, unspecified: Secondary | ICD-10-CM

## 2014-07-26 ENCOUNTER — Encounter (HOSPITAL_COMMUNITY): Payer: 59

## 2014-07-31 ENCOUNTER — Telehealth: Payer: Self-pay | Admitting: Cardiology

## 2014-07-31 ENCOUNTER — Encounter (HOSPITAL_COMMUNITY)
Admission: RE | Disposition: A | Discharge status: Home / Self Care | Payer: Self-pay | Source: Ambulatory Visit | Attending: Cardiology

## 2014-07-31 ENCOUNTER — Encounter (HOSPITAL_COMMUNITY): Payer: Self-pay | Admitting: *Deleted

## 2014-07-31 ENCOUNTER — Ambulatory Visit (HOSPITAL_COMMUNITY)
Admission: RE | Admit: 2014-07-31 | Discharge: 2014-07-31 | Disposition: A | Discharge status: Home / Self Care | Payer: 59 | Source: Ambulatory Visit | Attending: Cardiology | Admitting: Cardiology

## 2014-07-31 DIAGNOSIS — K219 Gastro-esophageal reflux disease without esophagitis: Secondary | ICD-10-CM | POA: Diagnosis not present

## 2014-07-31 DIAGNOSIS — Z7982 Long term (current) use of aspirin: Secondary | ICD-10-CM | POA: Diagnosis not present

## 2014-07-31 DIAGNOSIS — Z952 Presence of prosthetic heart valve: Secondary | ICD-10-CM | POA: Diagnosis not present

## 2014-07-31 DIAGNOSIS — I351 Nonrheumatic aortic (valve) insufficiency: Secondary | ICD-10-CM | POA: Diagnosis present

## 2014-07-31 DIAGNOSIS — Z85828 Personal history of other malignant neoplasm of skin: Secondary | ICD-10-CM | POA: Insufficient documentation

## 2014-07-31 DIAGNOSIS — E785 Hyperlipidemia, unspecified: Secondary | ICD-10-CM | POA: Insufficient documentation

## 2014-07-31 DIAGNOSIS — M109 Gout, unspecified: Secondary | ICD-10-CM | POA: Insufficient documentation

## 2014-07-31 DIAGNOSIS — I639 Cerebral infarction, unspecified: Secondary | ICD-10-CM | POA: Diagnosis present

## 2014-07-31 DIAGNOSIS — I447 Left bundle-branch block, unspecified: Secondary | ICD-10-CM | POA: Diagnosis not present

## 2014-07-31 DIAGNOSIS — Z7901 Long term (current) use of anticoagulants: Secondary | ICD-10-CM | POA: Diagnosis not present

## 2014-07-31 DIAGNOSIS — Z8673 Personal history of transient ischemic attack (TIA), and cerebral infarction without residual deficits: Secondary | ICD-10-CM | POA: Diagnosis not present

## 2014-07-31 HISTORY — PX: TEE WITHOUT CARDIOVERSION: SHX5443

## 2014-07-31 SURGERY — ECHOCARDIOGRAM, TRANSESOPHAGEAL
Anesthesia: Moderate Sedation

## 2014-07-31 MED ORDER — MIDAZOLAM HCL 5 MG/ML IJ SOLN
INTRAMUSCULAR | Status: AC
  Filled 2014-07-31: qty 2

## 2014-07-31 MED ORDER — AMPICILLIN SODIUM 2 G IJ SOLR
2.0000 g | Freq: Once | INTRAMUSCULAR | Status: AC
  Administered 2014-07-31: 2 g via INTRAVENOUS
  Filled 2014-07-31: qty 2000

## 2014-07-31 MED ORDER — BUTAMBEN-TETRACAINE-BENZOCAINE 2-2-14 % EX AERO
INHALATION_SPRAY | CUTANEOUS | Status: DC | PRN
  Administered 2014-07-31: 1 via TOPICAL

## 2014-07-31 MED ORDER — DIPHENHYDRAMINE HCL 50 MG/ML IJ SOLN
INTRAMUSCULAR | Status: AC
Start: 2014-07-31 — End: 2014-07-31
  Filled 2014-07-31: qty 1

## 2014-07-31 MED ORDER — SODIUM CHLORIDE 0.9 % IV SOLN
INTRAVENOUS | Status: DC
  Administered 2014-07-31: 09:00:00 via INTRAVENOUS

## 2014-07-31 MED ORDER — LIDOCAINE VISCOUS 2 % MT SOLN
OROMUCOSAL | Status: DC | PRN
  Administered 2014-07-31: 10 mL via OROMUCOSAL

## 2014-07-31 MED ORDER — MIDAZOLAM HCL 10 MG/2ML IJ SOLN
INTRAMUSCULAR | Status: DC | PRN
  Administered 2014-07-31: 2 mg via INTRAVENOUS

## 2014-07-31 MED ORDER — FENTANYL CITRATE 0.05 MG/ML IJ SOLN
INTRAMUSCULAR | Status: DC | PRN
  Administered 2014-07-31 (×2): 25 ug via INTRAVENOUS

## 2014-07-31 MED ORDER — LIDOCAINE VISCOUS 2 % MT SOLN
OROMUCOSAL | Status: AC
  Filled 2014-07-31: qty 15

## 2014-07-31 MED ORDER — FENTANYL CITRATE 0.05 MG/ML IJ SOLN
INTRAMUSCULAR | Status: AC
Start: 2014-07-31 — End: 2014-07-31
  Filled 2014-07-31: qty 2

## 2014-07-31 NOTE — Interval H&P Note (Signed)
History and Physical Interval Note:  07/31/2014 8:56 AM  Tracy Marshall.  has presented today for surgery, with the diagnosis of CVA  The various methods of treatment have been discussed with the patient and family. After consideration of risks, benefits and other options for treatment, the patient has consented to  Procedure(s): TRANSESOPHAGEAL ECHOCARDIOGRAM (TEE) (N/A) as a surgical intervention .  The patient's history has been reviewed, patient examined, no change in status, stable for surgery.  I have reviewed the patient's chart and labs.  Questions were answered to the patient's satisfaction.     TURNER,TRACI R

## 2014-07-31 NOTE — H&P (View-Only) (Signed)
Cardiology Office Note   Date:  07/24/2014   ID:  Tracy Marshall., DOB May 01, 1951, MRN [id number]  PCP:  Orpah Melter, MD  Cardiologist:   Sueanne Margarita, MD   No chief complaint on file.     History of Present Illness: Tracy Marshall. is a 64 y.o. male with a history of mechanical AVR remotely in 2001 who saw me in April 2014 with complaints of sharp CP. He was under a lot of stress with his wife having breast CA. A nuclear stress test was done which was low risk and showed a small apical defect. He had an echo done in 05/2014 for followup of his AVF and showed mild LV dysfunction EF 45-50% and a MUGA was done to verify EF which was 45% and echo from 2012 was 52%.Tracy Marshall  He was continued on medical therapy and presents back for followup last month and was doing well but noticed some drooping of the left side of his face.  He was referred to his PCP.  Dr. Maceo Pro called yesterday stating that patient is still having neurologic issues and an MRI this weekend showed small bilateral CVAs despite being therapeutic on his coumadin.  He would like him to have a TEE and carotid dopplers.  He just came back from [facility] with a episode of sharp pain while out there that was constant and nothing made it worse and it resolved after lying down.  He thinks it was indigestion.  He denied any SOB or nausea but had some mild diaphoresis.  It lasted about 30 minutes and resolved.  He complains of being fatigued.  He also says that he has exertional DOE with minimal movements.    Past Medical History  Diagnosis Date  . Hyperlipidemia   . TIA (transient ischemic attack)   . Atypical moles     melanomna  . Dizziness   . Cluster headaches   . Joint pain   . Gout   . Melanoma     hx of melanoma and multiple basal cell carcinomas Dr Tonia Brooms  . GERD (gastroesophageal reflux disease)   . Diverticulosis   . Fatty liver     hx of elevated hepatic transaminases-negative workup in 2009 except for U/S  suggesting fatty liver  . Episodic recurrent vertigo 2002 & 2005    carotid dopplers w no clinically significant stenosis  . Positive ANA (antinuclear antibody)   . Basal cell carcinoma   . LBBB (left bundle branch block)   . S/P AVR (aortic valve replacement)     mechanical  . Chronic anticoagulation     for mechanical AVR  . CVA (cerebral vascular accident) 07/24/2014     MRI indicating 2 punctate foci of acute infarction in the l    Past Surgical History  Procedure Laterality Date  . Cardiac valve replacement    . Cholecystectomy    . Appendectomy    . Melanoma excision      x3     Current Outpatient Prescriptions  Medication Sig Dispense Refill  . amoxicillin (AMOXIL) 875 MG tablet Take 875 mg by mouth as needed. PRIOR TO PROCEDURES  0  . aspirin 81 MG tablet Take 81 mg by mouth daily.    . colchicine 0.6 MG tablet Take 0.6 mg by mouth as needed.     . meclizine (ANTIVERT) 25 MG tablet Take 25 mg by mouth as needed for dizziness.    . Multiple Vitamin (MULTIVITAMIN) capsule Take 1  capsule by mouth daily.      . pantoprazole (PROTONIX) 40 MG tablet daily.    Tracy Marshall warfarin (COUMADIN) 5 MG tablet daily. 3 DAYS 5MG  4 DAYS 2.5 MG     No current facility-administered medications for this visit.    Allergies:   Review of patient's allergies indicates no known allergies.    Social History:  The patient  reports that he has never smoked. He has never used smokeless tobacco. He reports that he drinks alcohol. He reports that he does not use illicit drugs.   Family History:  The patient's family history includes Cancer in his father and mother; Melanoma in an other family member; Psoriasis in an other family member.    ROS:  Please see the history of present illness.   Otherwise, review of systems are positive for none.   All other systems are reviewed and negative.    PHYSICAL EXAM: VS:  BP 126/84 mmHg  Pulse 66  Ht 5\' 10"  (1.778 m)  Wt 201 lb (91.173 kg)  BMI 28.84 kg/m2 ,  BMI Body mass index is 28.84 kg/(m^2). GEN: Well nourished, well developed, in no acute distress HEENT: normal Neck: no JVD, carotid bruits, or masses Cardiac: RRR; no murmurs, rubs, or gallops,no edema  Respiratory:  clear to auscultation bilaterally, normal work of breathing GI: soft, nontender, nondistended, + BS MS: no deformity or atrophy Skin: warm and dry, no rash Neuro:  Strength and sensation are intact Psych: euthymic mood, full affect   EKG:  EKG is ordered today. The ekg ordered today demonstrates NSR with LBBB and PAC's   Recent Labs: No results found for requested labs within last 365 days.    Lipid Panel No results found for: CHOL, TRIG, HDL, CHOLHDL, VLDL, LDLCALC, LDLDIRECT    Wt Readings from Last 3 Encounters:  07/24/14 201 lb (91.173 kg)  07/21/14 199 lb (90.266 kg)  06/05/14 199 lb (90.266 kg)     ASSESSMENT AND PLAN:  1. Mechanical AVR with mild systolic murmur -  2D echo showed normally functioning AVR.  Continue warfarin - INR followed by Dr. Olen Pel.  He has not been taking an ASA so I instructed him to start an 81mg  ASA daily.   2. Chronic systemic anticoagulation - followed by his PCP 3. Atypical CP that may be related to GERD.  He has a persistent cough and has a lot of heartburn.  I will get a Stress myoview to rule out ischemia 4. Chronic LBBB 5. Neurological symptoms with MRI indicating 2 punctate foci of acute infarction in the left posterior frontal cortex without hemorrhage or mass effect.Late subacute infarction in the right insula and frontal opercular region.  Old right temporoparietal cortical and subcortical infarction. Infarctions of different age in both hemispheres suggest embolic infarctions, possibly from the heart or ascending aorta or both carotid bifurcation regions.  Will get carotid dopplers to evaluate.  I will also order a TEE to further evaluate for source of CVA such as PFO.   He has not been taking ASA so  I instructed him to go on ASA 81mg  daily.  His INR has been running around 2.7   Current medicines are reviewed at length with the patient today.  The patient does not have concerns regarding medicines.  The following changes have been made:  Added ASA 81mg  daily  Labs/ tests ordered today include: TEE, carotid dopplers     Disposition:   FU with me in 6 months   Signed,  Sueanne Margarita, MD  07/24/2014 10:09 PM    [facility] [place], [place], [place]  [address] Phone: [phone]; Fax: [phone]

## 2014-07-31 NOTE — Telephone Encounter (Signed)
Could not pass TEE probe due to increased resistance in upper esophagus.  Please refer ASAP to [facility] for upper endoscopy to assess for stricture.  Then reschedule TEE once endoscopy completed by GI

## 2014-07-31 NOTE — Interval H&P Note (Signed)
History and Physical Interval Note:  07/31/2014 10:10 AM  [name]  has presented today for surgery, with the diagnosis of CVA  The various methods of treatment have been discussed with the patient and family. After consideration of risks, benefits and other options for treatment, the patient has consented to  Procedure(s): TRANSESOPHAGEAL ECHOCARDIOGRAM (TEE) (N/A) as a surgical intervention .  The patient's history has been reviewed, patient examined, no change in status, stable for surgery.  I have reviewed the patient's chart and labs.  Questions were answered to the patient's satisfaction.     TURNER,TRACI R

## 2014-07-31 NOTE — CV Procedure (Addendum)
   PROCEDURE NOTE  Procedure:  Transesophageal echocardiogram Operator:  Fransico Him, MD Indications:  CVA Complications: unable to intubated esophagus due to increased pressure during probe placement in the esophagus. IV Meds: Fentanyl 109mcg and Versed 2mg   Results: Unable to proceed with TEE due to difficult passing the TEE probe in the esophagus.  Patient has a history of getting food stuck with eating.  Will refer to GI for upper endoscopy and then repeat TEE once cleared by GI.  My office will call to set up GI appt.  Patient has been instructed to call our office once his upper endoscopy by GI has been completed.  Signed: Fransico Him, MD [facility] 07/31/2014

## 2014-07-31 NOTE — Discharge Instructions (Signed)
Conscious Sedation, Adult, Care After °Refer to this sheet in the next few weeks. These instructions provide you with information on caring for yourself after your procedure. Your health care provider may also give you more specific instructions. Your treatment has been planned according to current medical practices, but problems sometimes occur. Call your health care provider if you have any problems or questions after your procedure. °WHAT TO EXPECT AFTER THE PROCEDURE  °After your procedure: °· You may feel sleepy, clumsy, and have poor balance for several hours. °· Vomiting may occur if you eat too soon after the procedure. °HOME CARE INSTRUCTIONS °· Do not participate in any activities where you could become injured for at least 24 hours. Do not: °¨ Drive. °¨ Swim. °¨ Ride a bicycle. °¨ Operate heavy machinery. °¨ Cook. °¨ Use power tools. °¨ Climb ladders. °¨ Work from a high place. °· Do not make important decisions or sign legal documents until you are improved. °· If you vomit, drink water, juice, or soup when you can drink without vomiting. Make sure you have little or no nausea before eating solid foods. °· Only take over-the-counter or prescription medicines for pain, discomfort, or fever as directed by your health care provider. °· Make sure you and your family fully understand everything about the medicines given to you, including what side effects may occur. °· You should not drink alcohol, take sleeping pills, or take medicines that cause drowsiness for at least 24 hours. °· If you smoke, do not smoke without supervision. °· If you are feeling better, you may resume normal activities 24 hours after you were sedated. °· Keep all appointments with your health care provider. °SEEK MEDICAL CARE IF: °· Your skin is pale or bluish in color. °· You continue to feel nauseous or vomit. °· Your pain is getting worse and is not helped by medicine. °· You have bleeding or swelling. °· You are still sleepy or  feeling clumsy after 24 hours. °SEEK IMMEDIATE MEDICAL CARE IF: °· You develop a rash. °· You have difficulty breathing. °· You develop any type of allergic problem. °· You have a fever. °MAKE SURE YOU: °· Understand these instructions. °· Will watch your condition. °· Will get help right away if you are not doing well or get worse. °Document Released: 03/29/2013 Document Reviewed: 03/29/2013 °[employer]® Patient Information ©2015 [employer]. This information is not intended to replace advice given to you by your health care provider. Make sure you discuss any questions you have with your health care provider. °  °

## 2014-07-31 NOTE — Progress Notes (Signed)
Echocardiogram Echocardiogram Transesophageal was attempted, Dr. Radford Pax could not pass the probe so the exam was canceled.  Joelene Millin 07/31/2014, 3:02 PM

## 2014-08-01 ENCOUNTER — Encounter (HOSPITAL_COMMUNITY): Payer: Self-pay | Admitting: Cardiology

## 2014-08-01 NOTE — Telephone Encounter (Signed)
Informed patient that he is being referred to a GI MD ASAP to assess for stricture then TEE will be rescheduled. Patient agrees with treatment plan.

## 2014-08-02 ENCOUNTER — Ambulatory Visit (HOSPITAL_BASED_OUTPATIENT_CLINIC_OR_DEPARTMENT_OTHER): Payer: 59 | Admitting: Cardiology

## 2014-08-02 ENCOUNTER — Ambulatory Visit (HOSPITAL_COMMUNITY): Payer: 59 | Attending: Cardiology | Admitting: Radiology

## 2014-08-02 DIAGNOSIS — I6523 Occlusion and stenosis of bilateral carotid arteries: Secondary | ICD-10-CM

## 2014-08-02 DIAGNOSIS — R079 Chest pain, unspecified: Secondary | ICD-10-CM | POA: Insufficient documentation

## 2014-08-02 DIAGNOSIS — I639 Cerebral infarction, unspecified: Secondary | ICD-10-CM | POA: Diagnosis not present

## 2014-08-02 MED ORDER — TECHNETIUM TC 99M SESTAMIBI GENERIC - CARDIOLITE
30.0000 | Freq: Once | INTRAVENOUS | Status: AC | PRN
  Administered 2014-08-02: 30 via INTRAVENOUS

## 2014-08-02 MED ORDER — REGADENOSON 0.4 MG/5ML IV SOLN
0.4000 mg | Freq: Once | INTRAVENOUS | Status: AC
  Administered 2014-08-02: 0.4 mg via INTRAVENOUS

## 2014-08-02 MED ORDER — TECHNETIUM TC 99M SESTAMIBI GENERIC - CARDIOLITE
10.0000 | Freq: Once | INTRAVENOUS | Status: AC | PRN
  Administered 2014-08-02: 10 via INTRAVENOUS

## 2014-08-02 NOTE — Progress Notes (Signed)
Carotid duplex performed 

## 2014-08-02 NOTE — Progress Notes (Signed)
[facility] [address] [place], [place] [address] [phone]    Cardiology Nuclear Med Study  Tracy Marshall. is a 64 y.o. male     MRN : [id number]     DOB: January 25, 1951  Procedure Date: 08/02/2014  Nuclear Med Background Indication for Stress Test:  Evaluation for Ischemia History:  2014 MPI: Scar CVA Cardiac Risk Factors: LBBB and Lipids  Symptoms:  Chest Pain, DOE and Palpitations   Nuclear Pre-Procedure Caffeine/Decaff Intake:  6:30pm NPO After: 7:00pm   Lungs:  clear O2 Sat: 98% on room air. IV 0.9% NS with Angio Cath:  22g  IV Site: R Wrist x 1, tolerated well IV Started by:  Irven Baltimore, RN  Chest Size (in):  44 Cup Size: n/a  Height: 5\' 10"  (1.778 m)  Weight:  198 lb (89.812 kg)  BMI:  Body mass index is 28.41 kg/(m^2). Tech Comments:  N/A    Nuclear Med Study 1 or 2 day study: 1 day  Stress Test Type:  Lexiscan  Reading MD: N/A  Order Authorizing Provider:  Fransico Him, MD  Resting Radionuclide: Technetium 74m Sestamibi  Resting Radionuclide Dose: 11.0 mCi   Stress Radionuclide:  Technetium 78m Sestamibi  Stress Radionuclide Dose: 33.0 mCi           Stress Protocol Rest HR: 62 Stress HR: 84  Rest BP: 116/92 Stress BP: 117/79  Exercise Time (min): n/a METS: n/a   Predicted Max HR: 158 bpm % Max HR: 53.16 bpm Rate Pressure Product: 9828   Dose of Adenosine (mg):  n/a Dose of Lexiscan: 0.4 mg  Dose of Atropine (mg): n/a Dose of Dobutamine: n/a mcg/kg/min (at max HR)  Stress Test Technologist: Perrin Maltese, EMT-P  Nuclear Technologist:  Earl Many, CNMT     Rest Procedure:  Myocardial perfusion imaging was performed at rest 45 minutes following the intravenous administration of Technetium 48m Sestamibi. Rest ECG: NSR-LBBB  Stress Procedure:  The patient received IV Lexiscan 0.4 mg over 15-seconds.  Technetium 16m Sestamibi injected at 30-seconds.This patient had sob and abdominal pain with the Lexiscan  injection.  Quantitative spect images were obtained after a 45 minute delay. Stress ECG: No significant change from baseline ECG  QPS Raw Data Images:  Normal; no motion artifact; normal heart/lung ratio. Stress Images:  Distal septum apical defect Rest Images:  Distal septal apical defect Subtraction (SDS):  No evidence of ischemia. Transient Ischemic Dilatation (Normal <1.22):  0.90 Lung/Heart Ratio (Normal <0.45):  0.26  Quantitative Gated Spect Images QGS EDV:  106 ml QGS ESV:  55 ml  Impression Exercise Capacity:  Lexiscan with no exercise. BP Response:  Normal blood pressure response. Clinical Symptoms:  There is dyspnea. ECG Impression:  No significant ST segment change suggestive of ischemia. Comparison with Prior Nuclear Study: No images to compare  Overall Impression:  Low risk stress nuclear study Small area of apical/septal infarct no ischemia.  LV Ejection Fraction: 48%.  LV Wall Motion:  Apical hypokinesis mild decrease in eF    [employer]

## 2014-08-03 ENCOUNTER — Encounter: Payer: Self-pay | Admitting: Cardiology

## 2014-08-03 ENCOUNTER — Telehealth: Payer: Self-pay | Admitting: Cardiology

## 2014-08-03 DIAGNOSIS — K222 Esophageal obstruction: Secondary | ICD-10-CM

## 2014-08-03 DIAGNOSIS — I6523 Occlusion and stenosis of bilateral carotid arteries: Secondary | ICD-10-CM | POA: Insufficient documentation

## 2014-08-03 HISTORY — DX: Occlusion and stenosis of bilateral carotid arteries: I65.23

## 2014-08-03 NOTE — Telephone Encounter (Signed)
New problem   Pt want to know the status of his GI referral that Dr Radford Pax talked to him about.

## 2014-08-06 NOTE — Telephone Encounter (Signed)
Patient informed of results and verbal understanding expressed.   Informed patient that the referral for GI has been placed and instructed him to wait a day or two as their office may be closed to due weather.  Repeat carotids ordered to be scheduled in one year.

## 2014-08-06 NOTE — Telephone Encounter (Signed)
-----   Message from Sueanne Margarita, MD sent at 08/03/2014 11:19 AM EST ----- 1-39% right and 40-59% left carotid artery stenosis - repeat dopplers in 1 year

## 2014-08-23 ENCOUNTER — Other Ambulatory Visit: Payer: Self-pay | Admitting: Gastroenterology

## 2014-08-23 DIAGNOSIS — R4702 Dysphasia: Secondary | ICD-10-CM

## 2014-08-30 ENCOUNTER — Ambulatory Visit
Admission: RE | Admit: 2014-08-30 | Discharge: 2014-08-30 | Disposition: A | Discharge status: Home / Self Care | Payer: 59 | Source: Ambulatory Visit | Attending: Gastroenterology | Admitting: Gastroenterology

## 2014-08-30 DIAGNOSIS — R4702 Dysphasia: Secondary | ICD-10-CM

## 2014-09-20 ENCOUNTER — Telehealth: Payer: Self-pay | Admitting: Cardiology

## 2014-09-20 NOTE — Telephone Encounter (Signed)
CV Procedure by Sueanne Margarita, MD at 07/31/2014 10:13 AM    Author: Sueanne Margarita, MD Service: Cardiology Author Type: Physician   Filed: 07/31/2014 10:30 AM Note Time: 07/31/2014 10:13 AM Status: Addendum   Editor: Sueanne Margarita, MD (Physician)     Related Notes: Original Note by Sueanne Margarita, MD (Physician) filed at 07/31/2014 10:27 AM   Expand All Collapse All     PROCEDURE NOTE  Procedure: Transesophageal echocardiogram Operator: Fransico Him, MD Indications: CVA Complications: unable to intubated esophagus due to increased pressure during probe placement in the esophagus. IV Meds: Fentanyl 2mcg and Versed 2mg   Results: Unable to proceed with TEE due to difficult passing the TEE probe in the esophagus. Patient has a history of getting food stuck with eating. Will refer to GI for upper endoscopy and then repeat TEE once cleared by GI. My office will call to set up GI appt. Patient has been instructed to call our office once his upper endoscopy by GI has been completed.  Signed: Fransico Him, MD [facility] 2/9/      Left message to call back.

## 2014-09-20 NOTE — Telephone Encounter (Signed)
New Message  Pt wanted to speak w/ Rn about f/u. Pt had endoscopy several weeks ago and then saw GI- [place]. Pt was told to follow up w/ Dr. Radford Pax. Please call back and discus.

## 2014-09-21 NOTE — Telephone Encounter (Signed)
Follow Up ° °Pt returned call//  °

## 2014-09-21 NOTE — Telephone Encounter (Signed)
Calling wanting to know what the next step would be.  Has seen Dr. Penelope Coop who told him that he would talk with Dr. Radford Pax and that she maybe would want to try TEE again and he would try to be available to assist if necessary.  Offered him an appointment with Margaret Pyle but he wants to see what Dr.Turner recommended.  Advised Dr. Radford Pax would not be back in office until next Wed 4/6.  Advised would send her nurse Sammuel Bailiff a message to talk with Dr. Radford Pax to see what she recommended. He states that is what he preferred.

## 2014-09-24 ENCOUNTER — Telehealth: Payer: Self-pay | Admitting: Cardiology

## 2014-09-24 NOTE — Telephone Encounter (Signed)
Spoke with patient, will have 1 tooth extracted on 4.12.16.  Patient has mechanical aortic valve.  Advised that we would prefer not to stop warfarin for this, but use other measures to decrease any bleeding after the procedure.  Pt fine with this, as he doesn't like to do lovenox injections.  Will have DDS call to confirm this information.

## 2014-09-24 NOTE — Telephone Encounter (Signed)
Please forward to coumadin clinic for instructions on holding coumadin

## 2014-09-24 NOTE — Telephone Encounter (Signed)
Called pt to schedule the TEE.  He states he is going to have a tooth extraction and possibly implant next Tuesday 4/12 and wanted to know when to hold coumadin.  Advised that would have to send message to Dr. Radford Pax. Also he would have to have dentist office call us to tell them when to hold coumadin.  The dentist is Dr. Purnell Shoemaker in [place]. States he wants to hold off on rescheduling TEE for now.  Advised the next time Dr. Radford Pax would be able to do procedure would be 4/25 unless she can schedule for another day.  States he is leaving on vacation 5/5 and would like done before then.  Will call back after tooth extraction to schedule. Will forward message to Dr. Radford Pax for her recommendation on when to stop coumadin. Will also forward to Everlean Alstrom so she will be aware that he will call to reschedule TEE.

## 2014-09-24 NOTE — Telephone Encounter (Signed)
New message      What dental office are you calling from? Dr Lavonne Chick 1. What is your office phone and fax number?   What type of procedure is the patient having performed? Tooth extraxction 2. What date is procedure scheduled? 10-02-14  3. What is your question (ex. Antibiotics prior to procedure, holding medication-we need to know how long dentist wants pt to hold med)? warfarin

## 2014-09-24 NOTE — Telephone Encounter (Signed)
Follow Up        Pt returning Anita's phone call.

## 2014-09-24 NOTE — Telephone Encounter (Signed)
Please find out from dentist if coumadin even needs to be held since it is just a tooth extraction.  Also he will need SBE antibiotic prophylaxis that his dentist should prescribe

## 2014-09-24 NOTE — Telephone Encounter (Signed)
He has a mechanical AVR so will have to be bridged

## 2014-09-24 NOTE — Telephone Encounter (Signed)
Please set up repeat TEE - we will try to sedate more since patient has strong gag reflex.  I spoke with Dr. Penelope Coop and barium swallow was fine so he feels we should try to repeat procedure

## 2014-09-24 NOTE — Telephone Encounter (Signed)
Advised that if he needs to come off coumadin will need to be bridged and our coumadin clinic will handle bridging.  Also will need an antibiotic that dentist will need to write. States he has the antibiotic. He states he did call his dentist and they should call us tomorrow to let us know what procedure will be done.  Will forward to coumadin clinic to be aware if he needs to stop coumadin.

## 2014-09-25 NOTE — Telephone Encounter (Signed)
Tracy Marshall discussed with pt 09/24/14, see telephone note in [place].

## 2014-09-26 ENCOUNTER — Telehealth: Payer: Self-pay | Admitting: Cardiology

## 2014-09-26 NOTE — Telephone Encounter (Signed)
The patient needs SBE prophylaxis that the dentist needs to prescribe. He needs to find out if he needs to be off blood thinners for the procedure and if so he needs to be bridged with Lovenox by coumaind clinic

## 2014-09-26 NOTE — Telephone Encounter (Signed)
Spoke with Verdis Frederickson at Dr. Paul Dykes office and told her pt would need SBE prophylaxis. Verdis Frederickson confirms they are aware of this and have ordered.   I told Verdis Frederickson if coumadin needs to be stopped pt would need Lovenox bridging. She does not think coumadin needs to be stopped but she will verify with Dr. Lavonne Chick and call us back tomorrow.  Maria aware if pt needs Lovenox bridging this can be arranged by our coumadin clinic.

## 2014-09-26 NOTE — Telephone Encounter (Signed)
New message       1. What dental office are you calling from?Dr Lavonne Chick  2. What is your office phone and fax number? Fax [phone]  3. What type of procedure is the patient having performed? Surgical extraction and bone grafting  What date is procedure scheduled? 10-02-14 4. What is your question (ex. Antibiotics prior to procedure, holding medication-we need to know how long dentist wants pt to hold med)?  Hold warfarin

## 2014-09-26 NOTE — Telephone Encounter (Signed)
Left message for Tracy Marshall to call back.

## 2014-09-26 NOTE — Telephone Encounter (Signed)
Follow up ° ° ° ° °Returning a nurses call °

## 2014-09-27 ENCOUNTER — Telehealth: Payer: Self-pay | Admitting: Cardiology

## 2014-09-27 NOTE — Telephone Encounter (Signed)
Follow up      Dr Noelle Penner want pt to have the lovenox bridge.  Please call pt and set this up per Dr Noelle Penner.  See prior messages.

## 2014-09-27 NOTE — Telephone Encounter (Signed)
Called Dentist, Dr. Chuck Hint. Dr. Lavonne Chick does want patient bridged and off Coumadin. Patient does not see our CV[facility]/Coumadin Clinic. He is managed by his PCP, Dr. Orpah Melter, at [facility]. Notified Lisa at Dr. Olen Pel office that they will need to coordinate the Lovenox bridge.   Called patient to notify him of the above. He verbalized understanding and will contact his PCP to follow up.

## 2014-09-27 NOTE — Telephone Encounter (Signed)
Verified with patient that he is referring to needing TEE. He states yes. Will route to Dr. Radford Pax.

## 2014-09-27 NOTE — Telephone Encounter (Signed)
Pt told he needs Endoscopy--would like to schedule before the end of the month--except next week due to dental procedure--pls call

## 2014-09-27 NOTE — Telephone Encounter (Signed)
Please set up TEE before end of month.  Can be with any of the TEE MDs.

## 2014-09-28 NOTE — Telephone Encounter (Signed)
Patient scheduled for TEE on Monday, 4/11 at 1:00 pm with Dr. Kirk Ruths (case # [phone]). Provided instructions to patient. He verbalized understanding and agreement with plan.  Patient knows to bring insurance and medication information with him an hour prior to the procedure. TEE is to evaluate source of CVA (r/o PFO). Dx: Bil. Carotid Artery Stenosis; Cerebral Infarct. Last INR known 07/31/14 = 2.7 (PCP monitors Coumadin/INR).

## 2014-09-29 ENCOUNTER — Other Ambulatory Visit: Payer: Self-pay | Admitting: Cardiology

## 2014-09-29 DIAGNOSIS — I639 Cerebral infarction, unspecified: Secondary | ICD-10-CM

## 2014-09-29 MED ORDER — SODIUM CHLORIDE 0.9 % IV SOLN: 2.0000 g | Freq: Once | INTRAVENOUS | Status: DC

## 2014-10-01 ENCOUNTER — Encounter (HOSPITAL_COMMUNITY): Payer: Self-pay | Admitting: *Deleted

## 2014-10-01 ENCOUNTER — Ambulatory Visit (HOSPITAL_COMMUNITY)
Admission: RE | Admit: 2014-10-01 | Discharge: 2014-10-01 | Disposition: A | Discharge status: Home / Self Care | Payer: 59 | Source: Ambulatory Visit | Attending: Cardiology | Admitting: Cardiology

## 2014-10-01 ENCOUNTER — Encounter (HOSPITAL_COMMUNITY)
Admission: RE | Disposition: A | Discharge status: Home / Self Care | Payer: Self-pay | Source: Ambulatory Visit | Attending: Cardiology

## 2014-10-01 DIAGNOSIS — Z85820 Personal history of malignant melanoma of skin: Secondary | ICD-10-CM | POA: Diagnosis not present

## 2014-10-01 DIAGNOSIS — Z8673 Personal history of transient ischemic attack (TIA), and cerebral infarction without residual deficits: Secondary | ICD-10-CM | POA: Insufficient documentation

## 2014-10-01 DIAGNOSIS — K219 Gastro-esophageal reflux disease without esophagitis: Secondary | ICD-10-CM | POA: Insufficient documentation

## 2014-10-01 DIAGNOSIS — I080 Rheumatic disorders of both mitral and aortic valves: Secondary | ICD-10-CM | POA: Diagnosis not present

## 2014-10-01 DIAGNOSIS — M109 Gout, unspecified: Secondary | ICD-10-CM | POA: Insufficient documentation

## 2014-10-01 DIAGNOSIS — Z7982 Long term (current) use of aspirin: Secondary | ICD-10-CM | POA: Insufficient documentation

## 2014-10-01 DIAGNOSIS — E785 Hyperlipidemia, unspecified: Secondary | ICD-10-CM | POA: Diagnosis not present

## 2014-10-01 DIAGNOSIS — I351 Nonrheumatic aortic (valve) insufficiency: Secondary | ICD-10-CM | POA: Diagnosis not present

## 2014-10-01 DIAGNOSIS — Z7901 Long term (current) use of anticoagulants: Secondary | ICD-10-CM | POA: Insufficient documentation

## 2014-10-01 DIAGNOSIS — I639 Cerebral infarction, unspecified: Secondary | ICD-10-CM

## 2014-10-01 DIAGNOSIS — Z952 Presence of prosthetic heart valve: Secondary | ICD-10-CM | POA: Diagnosis not present

## 2014-10-01 HISTORY — PX: TEE WITHOUT CARDIOVERSION: SHX5443

## 2014-10-01 SURGERY — ECHOCARDIOGRAM, TRANSESOPHAGEAL
Anesthesia: Moderate Sedation

## 2014-10-01 MED ORDER — MIDAZOLAM HCL 10 MG/2ML IJ SOLN
INTRAMUSCULAR | Status: DC | PRN
  Administered 2014-10-01: 1 mg via INTRAVENOUS
  Administered 2014-10-01 (×2): 2 mg via INTRAVENOUS
  Administered 2014-10-01: 1 mg via INTRAVENOUS

## 2014-10-01 MED ORDER — MIDAZOLAM HCL 5 MG/ML IJ SOLN
INTRAMUSCULAR | Status: AC
  Filled 2014-10-01: qty 2

## 2014-10-01 MED ORDER — FENTANYL CITRATE 0.05 MG/ML IJ SOLN
INTRAMUSCULAR | Status: DC | PRN
Start: 2014-10-01 — End: 2014-10-01
  Administered 2014-10-01 (×3): 25 ug via INTRAVENOUS

## 2014-10-01 MED ORDER — SODIUM CHLORIDE 0.9 % IV SOLN
INTRAVENOUS | Status: DC
  Administered 2014-10-01: 13:00:00 via INTRAVENOUS

## 2014-10-01 MED ORDER — BUTAMBEN-TETRACAINE-BENZOCAINE 2-2-14 % EX AERO
INHALATION_SPRAY | CUTANEOUS | Status: DC | PRN
  Administered 2014-10-01: 2 via TOPICAL

## 2014-10-01 MED ORDER — FENTANYL CITRATE 0.05 MG/ML IJ SOLN
INTRAMUSCULAR | Status: AC
  Filled 2014-10-01: qty 2

## 2014-10-01 NOTE — Progress Notes (Signed)
  Echocardiogram Echocardiogram Transesophageal has been performed.  Diamond Nickel 10/01/2014, 1:53 PM

## 2014-10-01 NOTE — Interval H&P Note (Signed)
History and Physical Interval Note:  10/01/2014 1:10 PM  Tracy Marshall.  has presented today for surgery, with the diagnosis of R/O BPO  The various methods of treatment have been discussed with the patient and family. After consideration of risks, benefits and other options for treatment, the patient has consented to  Procedure(s): TRANSESOPHAGEAL ECHOCARDIOGRAM (TEE) (N/A) as a surgical intervention .  The patient's history has been reviewed, patient examined, no change in status, stable for surgery.  I have reviewed the patient's chart and labs.  Questions were answered to the patient's satisfaction.     Kirk Ruths

## 2014-10-01 NOTE — Discharge Instructions (Signed)
Transesophageal Echocardiogram °Transesophageal echocardiography (TEE) is a picture test of your heart using sound waves. The pictures taken can give very detailed pictures of your heart. This can help your doctor see if there are problems with your heart. TEE can check: °· If your heart has blood clots in it. °· How well your heart valves are working. °· If you have an infection on the inside of your heart. °· Some of the major arteries of your heart. °· If your heart valve is working after a repair. °· Your heart before a procedure that uses a shock to your heart to get the rhythm back to normal. °BEFORE THE PROCEDURE °· Do not eat or drink for 6 hours before the procedure or as told by your doctor. °· Make plans to have someone drive you home after the procedure. Do not drive yourself home. °· An IV tube will be put in your arm. °PROCEDURE °· You will be given a medicine to help you relax (sedative). It will be given through the IV tube. °· A numbing medicine will be sprayed or gargled in the back of your throat to help numb it. °· The tip of the probe is placed into the back of your mouth. You will be asked to swallow. This helps to pass the probe into your esophagus. °· Once the tip of the probe is in the right place, your doctor can take pictures of your heart. °· You may feel pressure at the back of your throat. °AFTER THE PROCEDURE °· You will be taken to a recovery area so the sedative can wear off. °· Your throat may be sore and scratchy. This will go away slowly over time. °· You will go home when you are fully awake and able to swallow liquids. °· You should have someone stay with you for the next 24 hours. °· Do not drive or operate machinery for the next 24 hours. °Document Released: 04/05/2009 Document Revised: 06/13/2013 Document Reviewed: 12/08/2012 °[employer]® Patient Information ©2015 [employer]. This information is not intended to replace advice given to you by your health care provider. Make  sure you discuss any questions you have with your health care provider. ° °

## 2014-10-01 NOTE — H&P (Signed)
Tracy Marshall.  07/24/2014 3:00 PM  Office Visit  MRN:  [id number]   Description: Male DOB: [birth year]  Provider: Sueanne Margarita, MD  Department: [facility]       Vital Signs  Most recent update: 07/24/2014 3:26 PM by Tracy Marshall, CMA    BP Pulse Ht Wt BMI    126/84 mmHg 66 5\' 10"  (1.778 m) 201 lb (91.173 kg) 28.84 kg/m2    Vitals History     Progress Notes      Tracy Margarita, MD at 07/24/2014 3:00 PM     Status: Addendum       Expand All Collapse All      Cardiology Office Note   Date: 07/24/2014   ID: Tracy Marshall., DOB Oct 31, 1950, MRN [id number]  PCP: Tracy Melter, MD Cardiologist: Tracy Margarita, MD   No chief complaint on file.    History of Present Illness: Tracy Marshall. is a 64 y.o. male with a history of mechanical AVR remotely in 2001 who saw me in April 2014 with complaints of sharp CP. He was under a lot of stress with his wife having breast CA. A nuclear stress test was done which was low risk and showed a small apical defect. He had an echo done in 05/2014 for followup of his AVF and showed mild LV dysfunction EF 45-50% and a MUGA was done to verify EF which was 45% and echo from 2012 was 52%.[name] He was continued on medical therapy and presents back for followup last month and was doing well but noticed some drooping of the left side of his face. He was referred to his PCP. Dr. Maceo Marshall called yesterday stating that patient is still having neurologic issues and an MRI this weekend showed small bilateral CVAs despite being therapeutic on his coumadin. He would like him to have a TEE and carotid dopplers. He just came back from [facility] with a episode of sharp pain while out there that was constant and nothing made it worse and it resolved after lying down. He thinks it was indigestion. He denied any SOB or nausea but had some mild diaphoresis. It lasted about 30 minutes and resolved. He complains of being fatigued. He also  says that he has exertional DOE with minimal movements.    Past Medical History  Diagnosis Date  . Hyperlipidemia   . TIA (transient ischemic attack)   . Atypical moles     melanomna  . Dizziness   . Cluster headaches   . Joint pain   . Gout   . Melanoma     hx of melanoma and multiple basal cell carcinomas Dr Tonia Brooms  . GERD (gastroesophageal reflux disease)   . Diverticulosis   . Fatty liver     hx of elevated hepatic transaminases-negative workup in 2009 except for U/S suggesting fatty liver  . Episodic recurrent vertigo 2002 & 2005    carotid dopplers w no clinically significant stenosis  . Positive ANA (antinuclear antibody)   . Basal cell carcinoma   . LBBB (left bundle branch block)   . S/P AVR (aortic valve replacement)     mechanical  . Chronic anticoagulation     for mechanical AVR  . CVA (cerebral vascular accident) 07/24/2014    MRI indicating 2 punctate foci of acute infarction in the l    Past Surgical History  Procedure Laterality Date  . Cardiac valve replacement    . Cholecystectomy    . Appendectomy    .  Melanoma excision      x3     Current Outpatient Prescriptions  Medication Sig Dispense Refill  . amoxicillin (AMOXIL) 875 MG tablet Take 875 mg by mouth as needed. PRIOR TO PROCEDURES  0  . aspirin 81 MG tablet Take 81 mg by mouth daily.    . colchicine 0.6 MG tablet Take 0.6 mg by mouth as needed.     . meclizine (ANTIVERT) 25 MG tablet Take 25 mg by mouth as needed for dizziness.    . Multiple Vitamin (MULTIVITAMIN) capsule Take 1 capsule by mouth daily.     . pantoprazole (PROTONIX) 40 MG tablet daily.    [name] warfarin (COUMADIN) 5 MG tablet daily. 3 DAYS 5MG  4 DAYS 2.5 MG     No current facility-administered medications for this visit.    Allergies: Review of patient's allergies indicates no  known allergies.    Social History: The patient  reports that he has never smoked. He has never used smokeless tobacco. He reports that he drinks alcohol. He reports that he does not use illicit drugs.   Family History: The patient's family history includes Cancer in his father and mother; Melanoma in an other family member; Psoriasis in an other family member.    ROS: Please see the history of present illness. Otherwise, review of systems are positive for none. All other systems are reviewed and negative.    PHYSICAL EXAM: VS: BP 126/84 mmHg  Pulse 66  Ht 5\' 10"  (1.778 m)  Wt 201 lb (91.173 kg)  BMI 28.84 kg/m2 , BMI Body mass index is 28.84 kg/(m^2). GEN: Well nourished, well developed, in no acute distress  HEENT: normal  Neck: no JVD, carotid bruits, or masses Cardiac: RRR; no murmurs, rubs, or gallops,no edema  Respiratory: clear to auscultation bilaterally, normal work of breathing GI: soft, nontender, nondistended, + BS MS: no deformity or atrophy  Skin: warm and dry, no rash Neuro: Strength and sensation are intact Psych: euthymic mood, full affect   EKG: EKG is ordered today. The ekg ordered today demonstrates NSR with LBBB and PAC's   Recent Labs: No results found for requested labs within last 365 days.    Lipid Panel  Labs (Brief)    No results found for: CHOL, TRIG, HDL, CHOLHDL, VLDL, LDLCALC, LDLDIRECT     Wt Readings from Last 3 Encounters:  07/24/14 201 lb (91.173 kg)  07/21/14 199 lb (90.266 kg)  06/05/14 199 lb (90.266 kg)     ASSESSMENT AND PLAN:  1. Mechanical AVR with mild systolic murmur - 2D echo showed normally functioning AVR. Continue warfarin - INR followed by Dr. Olen Pel. He has not been taking an ASA so I instructed him to start an 81mg  ASA daily.  2. Chronic systemic anticoagulation - followed by his PCP 3. Atypical CP that may be related to GERD. He has a persistent cough and has a lot  of heartburn. I will get a Stress myoview to rule out ischemia 4. Chronic LBBB 5. Neurological symptoms with MRI indicating 2 punctate foci of acute infarction in the left posterior frontal cortex without hemorrhage or mass effect.Late subacute infarction in the right insula and frontal opercular region. Old right temporoparietal cortical and subcortical infarction. Infarctions of different age in both hemispheres suggest embolic infarctions, possibly from the heart or ascending aorta or both carotid bifurcation regions. Will get carotid dopplers to evaluate. I will also order a TEE to further evaluate for source of CVA such as PFO. He has  not been taking ASA so I instructed him to go on ASA 81mg  daily. His INR has been running around 2.7   Current medicines are reviewed at length with the patient today. The patient does not have concerns regarding medicines.  The following changes have been made: Added ASA 81mg  daily  Labs/ tests ordered today include: TEE, carotid dopplers     Disposition: FU with me in 6 months   Signed, Tracy Margarita, MD  07/24/2014 10:09 PM  [facility] [address], [place], [place] [address] Phone: [phone]; Fax: [phone]        For TEE to R/O SOE; no changes. Kirk Ruths

## 2014-10-01 NOTE — CV Procedure (Signed)
See full TEE report in [employer].  Tracy Marshall  

## 2014-10-02 ENCOUNTER — Encounter (HOSPITAL_COMMUNITY): Payer: Self-pay | Admitting: Cardiology

## 2014-10-04 ENCOUNTER — Emergency Department (HOSPITAL_BASED_OUTPATIENT_CLINIC_OR_DEPARTMENT_OTHER): Payer: 59

## 2014-10-04 ENCOUNTER — Encounter (HOSPITAL_BASED_OUTPATIENT_CLINIC_OR_DEPARTMENT_OTHER): Payer: Self-pay | Admitting: *Deleted

## 2014-10-04 ENCOUNTER — Emergency Department (HOSPITAL_BASED_OUTPATIENT_CLINIC_OR_DEPARTMENT_OTHER)
Admission: EM | Admit: 2014-10-04 | Discharge: 2014-10-04 | Disposition: A | Discharge status: Home / Self Care | Payer: 59 | Attending: Emergency Medicine | Admitting: Emergency Medicine

## 2014-10-04 DIAGNOSIS — Z8639 Personal history of other endocrine, nutritional and metabolic disease: Secondary | ICD-10-CM | POA: Insufficient documentation

## 2014-10-04 DIAGNOSIS — R339 Retention of urine, unspecified: Secondary | ICD-10-CM | POA: Diagnosis not present

## 2014-10-04 DIAGNOSIS — Z7901 Long term (current) use of anticoagulants: Secondary | ICD-10-CM | POA: Diagnosis not present

## 2014-10-04 DIAGNOSIS — Z8679 Personal history of other diseases of the circulatory system: Secondary | ICD-10-CM | POA: Diagnosis not present

## 2014-10-04 DIAGNOSIS — R110 Nausea: Secondary | ICD-10-CM

## 2014-10-04 DIAGNOSIS — N110 Nonobstructive reflux-associated chronic pyelonephritis: Secondary | ICD-10-CM | POA: Insufficient documentation

## 2014-10-04 DIAGNOSIS — Z8673 Personal history of transient ischemic attack (TIA), and cerebral infarction without residual deficits: Secondary | ICD-10-CM | POA: Diagnosis not present

## 2014-10-04 DIAGNOSIS — Z85820 Personal history of malignant melanoma of skin: Secondary | ICD-10-CM | POA: Diagnosis not present

## 2014-10-04 DIAGNOSIS — K219 Gastro-esophageal reflux disease without esophagitis: Secondary | ICD-10-CM | POA: Diagnosis not present

## 2014-10-04 DIAGNOSIS — M109 Gout, unspecified: Secondary | ICD-10-CM | POA: Insufficient documentation

## 2014-10-04 DIAGNOSIS — Z9049 Acquired absence of other specified parts of digestive tract: Secondary | ICD-10-CM | POA: Diagnosis not present

## 2014-10-04 DIAGNOSIS — Z79899 Other long term (current) drug therapy: Secondary | ICD-10-CM | POA: Insufficient documentation

## 2014-10-04 DIAGNOSIS — Z7982 Long term (current) use of aspirin: Secondary | ICD-10-CM | POA: Insufficient documentation

## 2014-10-04 DIAGNOSIS — R319 Hematuria, unspecified: Secondary | ICD-10-CM | POA: Diagnosis not present

## 2014-10-04 DIAGNOSIS — R109 Unspecified abdominal pain: Secondary | ICD-10-CM

## 2014-10-04 DIAGNOSIS — R1031 Right lower quadrant pain: Secondary | ICD-10-CM | POA: Diagnosis present

## 2014-10-04 LAB — COMPREHENSIVE METABOLIC PANEL
ALBUMIN: 4.7 g/dL (ref 3.5–5.2)
ALK PHOS: 60 U/L (ref 39–117)
ALT: 62 U/L — ABNORMAL HIGH (ref 0–53)
AST: 50 U/L — ABNORMAL HIGH (ref 0–37)
Anion gap: 10 (ref 5–15)
BUN: 16 mg/dL (ref 6–23)
CALCIUM: 9.8 mg/dL (ref 8.4–10.5)
CO2: 25 mmol/L (ref 19–32)
CREATININE: 1.19 mg/dL (ref 0.50–1.35)
Chloride: 101 mmol/L (ref 96–112)
GFR calc Af Amer: 74 mL/min — ABNORMAL LOW (ref >90)
GFR calc non Af Amer: 64 mL/min — ABNORMAL LOW (ref >90)
Glucose, Bld: 147 mg/dL — ABNORMAL HIGH (ref 70–99)
POTASSIUM: 4.3 mmol/L (ref 3.5–5.1)
Sodium: 136 mmol/L (ref 135–145)
Total Bilirubin: 1 mg/dL (ref 0.3–1.2)
Total Protein: 9.2 g/dL — ABNORMAL HIGH (ref 6.0–8.3)

## 2014-10-04 LAB — URINALYSIS, ROUTINE W REFLEX MICROSCOPIC
Glucose, UA: NEGATIVE mg/dL
Ketones, ur: 40 mg/dL — AB
LEUKOCYTES UA: NEGATIVE
NITRITE: NEGATIVE
PROTEIN: NEGATIVE mg/dL
Specific Gravity, Urine: 1.02 (ref 1.005–1.030)
Urobilinogen, UA: 1 mg/dL (ref 0.0–1.0)
pH: 6 (ref 5.0–8.0)

## 2014-10-04 LAB — PROTIME-INR
INR: 1.42 (ref 0.00–1.49)
Prothrombin Time: 17.4 seconds — ABNORMAL HIGH (ref 11.6–15.2)

## 2014-10-04 LAB — CBC
HCT: 44.7 % (ref 39.0–52.0)
Hemoglobin: 15.1 g/dL (ref 13.0–17.0)
MCH: 29.2 pg (ref 26.0–34.0)
MCHC: 33.8 g/dL (ref 30.0–36.0)
MCV: 86.3 fL (ref 78.0–100.0)
PLATELETS: 271 10*3/uL (ref 150–400)
RBC: 5.18 MIL/uL (ref 4.22–5.81)
RDW: 13.9 % (ref 11.5–15.5)
WBC: 13.5 10*3/uL — ABNORMAL HIGH (ref 4.0–10.5)

## 2014-10-04 LAB — URINE MICROSCOPIC-ADD ON

## 2014-10-04 NOTE — ED Provider Notes (Signed)
CSN: [id number]     Arrival date & time 10/04/14  1115 History   First MD Initiated Contact with Patient 10/04/14 1223     Chief Complaint  Patient presents with  . Abdominal Pain     (Consider location/radiation/quality/duration/timing/severity/associated sxs/prior Treatment) HPI  Tracy Marshall. is a 63 y.o. male with PMH of mechanical aortic valve replacements rigidity on warfarin but now on Lovenox for recent tooth extraction, CVA, bilateral carotid artery stenosis, hyperlipidemia presenting with severe abdominal pain described as sharp when he woke up this morning. It is been intermittent, denies alleviating or aggravating factors. Pain was in her right and mid lower quadrant with radiation to right flank. He is also noted some urinary retention as well as blood clots. Last BM yesterday and normal without blood or melena. Abdominal surgeries include appendectomy and cholecystectomy. Patient has had nausea but denies any at this time. No pain at this time. No vomiting.   Past Medical History  Diagnosis Date  . Hyperlipidemia   . TIA (transient ischemic attack)   . Atypical moles     melanomna  . Dizziness   . Cluster headaches   . Joint pain   . Gout   . Melanoma     hx of melanoma and multiple basal cell carcinomas Dr Tonia Brooms  . GERD (gastroesophageal reflux disease)   . Diverticulosis   . Fatty liver     hx of elevated hepatic transaminases-negative workup in 2009 except for U/S suggesting fatty liver  . Episodic recurrent vertigo 2002 & 2005    carotid dopplers w no clinically significant stenosis  . Positive ANA (antinuclear antibody)   . Basal cell carcinoma   . LBBB (left bundle branch block)   . S/P AVR (aortic valve replacement)     mechanical  . Chronic anticoagulation     for mechanical AVR  . CVA (cerebral vascular accident) 07/24/2014     MRI indicating 2 punctate foci of acute infarction in the l  . Bilateral carotid artery stenosis 08/03/2014    1-39% right  and 40-59% left carotid artery stenosis   Past Surgical History  Procedure Laterality Date  . Cardiac valve replacement    . Cholecystectomy    . Appendectomy    . Melanoma excision      x3  . Tee without cardioversion N/A 07/31/2014    Procedure: TRANSESOPHAGEAL ECHOCARDIOGRAM (TEE);  Surgeon: Sueanne Margarita, MD;  Location: [facility] ENDOSCOPY;  Service: Cardiovascular;  Laterality: N/A;  . Tee without cardioversion N/A 10/01/2014    Procedure: TRANSESOPHAGEAL ECHOCARDIOGRAM (TEE);  Surgeon: Lelon Perla, MD;  Location: [facility] ENDOSCOPY;  Service: Cardiovascular;  Laterality: N/A;  . Dental surgery Left bone graft and extraction   Family History  Problem Relation Age of Onset  . Melanoma    . Psoriasis    . Cancer Mother     melanoma, ovarian  . Cancer Father     prostate   History  Substance Use Topics  . Smoking status: Never Smoker   . Smokeless tobacco: Never Used  . Alcohol Use: Yes     Comment: moderate    Review of Systems 10 Systems reviewed and are negative for acute change except as noted in the HPI.    Allergies  Review of patient's allergies indicates no known allergies.  Home Medications   Prior to Admission medications   Medication Sig Start Date End Date Taking? Authorizing Provider  acetaminophen-codeine (TYLENOL #3) 300-30 MG per tablet Take by  mouth every 4 (four) hours as needed for moderate pain.   Yes Historical Provider, MD  amoxicillin (AMOXIL) 875 MG tablet Take 875 mg by mouth as needed. PRIOR TO PROCEDURES 05/28/14  Yes Historical Provider, MD  aspirin 81 MG tablet Take 81 mg by mouth daily.   Yes Historical Provider, MD  colchicine 0.6 MG tablet Take 0.6 mg by mouth as needed.    Yes Historical Provider, MD  enoxaparin (LOVENOX) 120 MG/0.8ML injection Inject 120 mg into the skin.   Yes Historical Provider, MD  meclizine (ANTIVERT) 25 MG tablet Take 25 mg by mouth as needed for dizziness.   Yes Historical Provider, MD  Multiple Vitamin (MULTIVITAMIN)  capsule Take 1 capsule by mouth daily.     Yes Historical Provider, MD  pantoprazole (PROTONIX) 40 MG tablet daily. 01/26/11  Yes Historical Provider, MD  ranitidine (ZANTAC) 75 MG tablet Take 75 mg by mouth 2 (two) times daily.   Yes Historical Provider, MD  warfarin (COUMADIN) 5 MG tablet daily. 3 DAYS 5MG  4 DAYS 2.5 MG 01/26/11   Historical Provider, MD   BP 122/86 mmHg  Pulse 74  Temp(Src) 98.5 F (36.9 C) (Oral)  Resp 16  Ht 5\' 10"  (1.778 m)  Wt 190 lb (86.183 kg)  BMI 27.26 kg/m2  SpO2 97% Physical Exam  Constitutional: He appears well-developed and well-nourished. No distress.  HENT:  Head: Normocephalic and atraumatic.  Clot to left back molar without any active bleeding. Oropharynx without any blood or swelling. No trismus or uvula deviation.  Eyes: Conjunctivae and EOM are normal. Right eye exhibits no discharge. Left eye exhibits no discharge.  Neck: Neck supple.  Cardiovascular: Normal rate and regular rhythm.   Pulmonary/Chest: Effort normal and breath sounds normal. No respiratory distress. He has no wheezes.  Abdominal: Soft. Bowel sounds are normal. He exhibits no distension. There is no tenderness.  Lymphadenopathy:    He has no cervical adenopathy.  Neurological: He is alert. He exhibits normal muscle tone. Coordination normal.  Skin: Skin is warm and dry. He is not diaphoretic.  Nursing note and vitals reviewed.   ED Course  Procedures (including critical care time) Labs Review Labs Reviewed  URINALYSIS, ROUTINE W REFLEX MICROSCOPIC - Abnormal; Notable for the following:    Color, Urine AMBER (*)    APPearance CLOUDY (*)    Hgb urine dipstick LARGE (*)    Bilirubin Urine SMALL (*)    Ketones, ur 40 (*)    All other components within normal limits  CBC - Abnormal; Notable for the following:    WBC 13.5 (*)    All other components within normal limits  COMPREHENSIVE METABOLIC PANEL - Abnormal; Notable for the following:    Glucose, Bld 147 (*)    Total  Protein 9.2 (*)    AST 50 (*)    ALT 62 (*)    GFR calc non Af Amer 64 (*)    GFR calc Af Amer 74 (*)    All other components within normal limits  PROTIME-INR - Abnormal; Notable for the following:    Prothrombin Time 17.4 (*)    All other components within normal limits  URINE MICROSCOPIC-ADD ON - Abnormal; Notable for the following:    Bacteria, UA FEW (*)    Casts HYALINE CASTS (*)    All other components within normal limits    Imaging Review Ct Renal Stone Study  10/04/2014   CLINICAL DATA:  Fever 10/02/2013.  Right side abdominal pain today.  EXAM: CT ABDOMEN AND PELVIS WITHOUT CONTRAST  TECHNIQUE: Multidetector CT imaging of the abdomen and pelvis was performed following the standard protocol without IV contrast.  COMPARISON:  None.  FINDINGS: The lung bases are clear. No pleural or pericardial effusion. Prosthetic aortic valve is identified. Heart size is normal.  Left renal scarring is identified, worst in the upper pole. Small bilateral renal cysts are noted. No renal or ureteral stones are identified and there is no hydronephrosis on the right or left.  The patient is status post cholecystectomy. There is diffuse fatty infiltration of the liver. No focal lesion is identified. The spleen, adrenal glands and pancreas appear normal. A Foley catheter is in place in a decompressed urinary bladder. There is some sigmoid diverticulosis without diverticulitis. The stomach and small bowel appear normal. Scattered, mild aortoiliac atherosclerosis is noted. No lymphadenopathy or fluid is identified. No focal bony abnormality is seen.  IMPRESSION: No acute finding. Negative for urinary tract stone. Scarring in the left kidney is most notable in the upper pole.  Diffuse fatty infiltration liver.  Status post cholecystectomy.   Electronically Signed   By: Inge Rise M.D.   On: 10/04/2014 14:06     EKG Interpretation None      MDM   Final diagnoses:  Hematuria  Abdominal pain  Nausea    Patient presenting with right to mid abdominal pain that radiates to back and hematuria.  no pain at this time. Patient also reported urinary retention and a Foley was placed. Multiple clots were Foley. Patient reports significant decrease in his pain after this. VSS. No abdominal tenderness on exam. Lab work reassuring. CT renal study without evidence of nephrolithiasis. There is scarring to upper pole of left kidney which is likely incidental finding and not related to his pain. Patient to follow-up with urology for hematuria. Patient also follow up with PCP as well as dentist for reported teeth pain.  Discussed return precautions with patient. Discussed all results and patient verbalizes understanding and agrees with plan.  This is a shared patient. This patient was discussed with the physician who saw and evaluated the patient and agrees with the plan.    Al Corpus, PA-C 10/04/14 Rafael Capo, MD 10/06/14 [phone]

## 2014-10-04 NOTE — ED Notes (Signed)
Pt reports bleeding with tooth pain and in urine. Sts he has been unable to urinate since last night. Reports lower abdominal pain that radiates to the back.

## 2014-10-04 NOTE — ED Notes (Signed)
Reports sever abd pain this am- hematuria and back pain last night but unable to void today since waking- had dental procedure on Tuesday (extraction and bone graft)- pt transitioning from lovenox onto to coumadin

## 2014-10-04 NOTE — ED Notes (Signed)
Dawn EMT was placing the foley catheter with no urine return. This RN irrigated the foley, 2 blood clots were drawn out. Urine specimen obtained and sent to lab. Pt now has urine flow.

## 2014-10-04 NOTE — ED Notes (Signed)
Patient transported to CT 

## 2014-10-04 NOTE — ED Notes (Signed)
PA at bedside.

## 2014-10-04 NOTE — ED Notes (Signed)
Patient unable to void at this time. RT at bedside for lab work.

## 2014-10-04 NOTE — ED Notes (Signed)
Pt to keep Foley catheter in place. Pt sent home with leg bag.

## 2014-10-04 NOTE — Discharge Instructions (Signed)
Return to the emergency room with worsening of symptoms, new symptoms or with symptoms that are concerning , especially fevers, abdominal pain in one area, unable to keep down fluids, blood in stool or vomit, severe pain, you feel faint, lightheaded or pass out. Please call your doctor for a followup appointment within 24-48 hours. When you talk to your doctor please let them know that you were seen in the emergency department and have them acquire all of your records so that they can discuss the findings with you and formulate a treatment plan to fully care for your new and ongoing problems.  Follow up with urology. Call to make appointment. Follow up with dentist. Read below information and follow recommendations. Hematuria Hematuria is blood in your urine. It can be caused by a bladder infection, kidney infection, prostate infection, kidney stone, or cancer of your urinary tract. Infections can usually be treated with medicine, and a kidney stone usually will pass through your urine. If neither of these is the cause of your hematuria, further workup to find out the reason may be needed. It is very important that you tell your health care provider about any blood you see in your urine, even if the blood stops without treatment or happens without causing pain. Blood in your urine that happens and then stops and then happens again can be a symptom of a very serious condition. Also, pain is not a symptom in the initial stages of many urinary cancers. HOME CARE INSTRUCTIONS   Drink lots of fluid, 3-4 quarts a day. If you have been diagnosed with an infection, cranberry juice is especially recommended, in addition to large amounts of water.  Avoid caffeine, tea, and carbonated beverages because they tend to irritate the bladder.  Avoid alcohol because it may irritate the prostate.  Take all medicines as directed by your health care provider.  If you were prescribed an antibiotic medicine, finish it  all even if you start to feel better.  If you have been diagnosed with a kidney stone, follow your health care provider's instructions regarding straining your urine to catch the stone.  Empty your bladder often. Avoid holding urine for long periods of time.  After a bowel movement, women should cleanse front to back. Use each tissue only once.  Empty your bladder before and after sexual intercourse if you are a male. SEEK MEDICAL CARE IF:  You develop back pain.  You have a fever.  You have a feeling of sickness in your stomach (nausea) or vomiting.  Your symptoms are not better in 3 days. Return sooner if you are getting worse. SEEK IMMEDIATE MEDICAL CARE IF:   You develop severe vomiting and are unable to keep the medicine down.  You develop severe back or abdominal pain despite taking your medicines.  You begin passing a large amount of blood or clots in your urine.  You feel extremely weak or faint, or you pass out. MAKE SURE YOU:   Understand these instructions.  Will watch your condition.  Will get help right away if you are not doing well or get worse. Document Released: 06/08/2005 Document Revised: 10/23/2013 Document Reviewed: 02/06/2013 [facility] Patient Information 2015 [place], [place]. This information is not intended to replace advice given to you by your health care provider. Make sure you discuss any questions you have with your health care provider.  Anticoagulation, Generic Anticoagulants are medicines used to prevent clots from developing in your veins. These medicine are also known as blood  thinners. If blood clots are untreated, they could travel to your lungs. This is called a pulmonary embolus. A blood clot in your lungs can be fatal.  Health care providers often use anticoagulants to prevent clots following surgery. Anticoagulants are also used along with aspirin when the heart is not getting enough blood. Another anticoagulant called warfarin is  started 2 to 3 days after a rapid-acting injectable anticoagulant is started. The rapid-acting anticoagulants are usually continued until warfarin has begun to work. Your health care provider will judge this length of time by blood tests known as the prothrombin time (PT) and International Normalization Ratio (INR). This means that your blood is at the necessary and best level to prevent clots. RISKS AND COMPLICATIONS  If you have received recent epidural anesthesia, spinal anesthesia, or a spinal tap while receiving anticoagulants, you are at risk for developing a blood clot in or around the spine. This condition could result in long-term or permanent paralysis.  Because anticoagulants thin your blood, severe bleeding may occur from any tissue or organ. Symptoms of the blood being too thin may include:  Bleeding from the nose or gums that does not stop quickly.  Blood in bowel movements which may appear as bright red, dark, or black tarry stools.  Blood in the urine which may appear as pink, red, or brown urine.  Unusual bruising or bruising easily.  A cut that does not stop bleeding within 10 minutes.  Vomiting blood or continuous nausea for more than 1 day.  Coughing up blood.  Broken blood vessels in your eye (subconjunctival hemorrhage).  Abdominal or back pain with or without flank bruising.  Sudden, severe headache.  Sudden weakness or numbness of the face, arm, or leg, especially on one side of the body.  Sudden confusion.  Trouble speaking (aphasia) or understanding.  Sudden trouble seeing in one or both eyes.  Sudden trouble walking.  Dizziness.  Loss of balance or coordination.  Vaginal bleeding.  Swelling or pain at an injection site.  Superficial fat tissue death (necrosis) which may cause skin scarring. This is more common in women and may first present as pain in the waist, thighs, or buttocks.  Fever.  Too little anticoagulation continues to allow the  risk for blood clots. HOME CARE INSTRUCTIONS   Due to the complications of anticoagulants, it is very important that you take your anticoagulant as directed by your health care provider. Anticoagulants need to be taken exactly as instructed. Be sure you understand all your anticoagulant instructions.  Keep all follow-up appointments with your health care provider as directed. It is very important to keep your appointments. Not keeping appointments could result in a chronic or permanent injury, pain, or disability.  Warfarin. Your health care provider will advise you on the length of treatment (usually 3-6 months, sometimes lifelong).  Take warfarin exactly as directed by your health care provider. It is recommended that you take your warfarin dose at the same time of the day. It is preferred that you take warfarin in the late afternoon. If you have been told to stop taking warfarin, do not resume taking warfarin until directed to do so by your health care provider. Follow your health care provider's instructions if you accidentally take an extra dose or miss a dose of warfarin. It is very important to take warfarin as directed since bleeding or blood clots could result in chronic or permanent injury, pain, or disability.  Too much and too little warfarin are both  dangerous. Too much warfarin increases the risk of bleeding. Too little warfarin continues to allow the risk for blood clots. While taking warfarin, you will need to have regular blood tests to measure your blood clotting time. These blood tests usually include both the prothrombin time (PT) and International Normalized Ratio (INR) tests. The PT and INR results allow your health care provider to adjust your dose of warfarin. The dose can change for many reasons. It is critically important that you have your PT and INR levels drawn exactly as directed. Your warfarin dose may stay the same or change depending on what the PT and INR results are. Be  sure to follow up with your health care provider regarding your PT and INR test results and what your warfarin dosage should be.  Many medicines can interfere with warfarin and affect the PT and INR results. You must tell your health care provider about any and all medicines you take, this includes all vitamins and supplements. Ask your health care provider before taking these. Prescription and over-the-counter medicine consistency is critical to warfarin management. It is important that potential interactions are checked before you start a new medicine. Be especially cautious with aspirin and anti-inflammatory medicines. Ask your health care provider before taking these. Medicines such as antibiotics and acid-reducing medicine can interact with warfarin and can cause an increased warfarin effect. Warfarin can also interfere with the effectiveness of medicines you are taking. Do not take or discontinue any prescribed or over-the-counter medicine except on the advice of your health care provider or pharmacist.  Some vitamins, supplements, and herbal products interfere with the effectiveness of warfarin. Vitamin E may increase the anticoagulant effects of warfarin. Vitamin K may can cause warfarin to be less effective. Do not take or discontinue any vitamin, supplement, or herbal product except on the advice of your health care provider or pharmacist.  Eat what you normally eat and keep the vitamin K content of your diet consistent. Avoid major changes in your diet, or notify your health care provider before changing your diet. Suddenly getting a lot more vitamin K could cause your blood to clot too quickly. A sudden decrease in vitamin K intake could cause your blood to clot too slowly. These changes in vitamin K intake could lead to dangerous blood clotsor to bleeding. To keep your vitamin K intake consistent, you must be aware of which foods contain moderate or high amounts of vitamin K. Some foods high in  vitamin K include spinach, kale, broccoli, cabbage, greens, Brussels sprouts, asparagus, [place], coleslaw, parsley, and green tea. Arrange a visit with a dietitian to answer your questions.  If you have a loss of appetite or get the stomach flu (viral gastroenteritis), talk to your health care provider as soon as possible. A decrease in your normal vitamin K intake can make you more sensitive to your usual dose of warfarin.  Some medical conditions may increase your risk for bleeding while you are taking warfarin. A fever, diarrhea lasting more than a day, worsening heart failure, or worsening liver function are some medical conditions that could affect warfarin. Contact your health care provider if you have any of these medical conditions.  Alcohol can change the body's ability to handle warfarin. It is best to avoid alcoholic drinks or consume only very small amounts while taking warfarin. Notify your health care provider if you change your alcohol intake. A sudden increase in alcohol use can increase your risk of bleeding. Chronic alcohol use  can cause warfarin to be less effective.  Be careful not to cut yourself when using sharp objects or while shaving.  Inform all your health care providers and your dentist that you take an anticoagulant.  Limit physical activities or sports that could result in a fall or cause injury. Avoid contact sports.  Wear medical alert jewelry or carry a medical alert card. SEEK IMMEDIATE MEDICAL CARE IF:  You cough up blood.  You have dark or black stools or there is bright red blood coming from your rectum.  You vomit blood or have nausea for more than 1 day.  You have blood in the urine or pink colored urine.  You have unusual bruising or have increased bruising.  You have bleeding from the nose or gums that does not stop quickly.  You have a cut that does not stop bleeding within a 2-3 minutes.  You have sudden weakness or numbness of the face,  arm, or leg, especially on one side of the body.  You have sudden confusion.  You have trouble speaking (aphasia) or understanding.  You have sudden trouble seeing in one or both eyes.  You have sudden trouble walking.  You have dizziness.  You have a loss of balance or coordination.  You have a sudden, severe headache.  You have a serious fall or head injury, even if you are not bleeding.  You have swelling or pain at an injection site.  You have unexplained tenderness or pain in the abdomen, back, waist, thighs or buttocks.  You have a fever. Any of these symptoms may represent a serious problem that is an emergency. Do not wait to see if the symptoms will go away. Get medical help right away. Call your local emergency services (911 in [place] Do not drive yourself to the hospital. Document Released: 06/08/2005 Document Revised: 06/13/2013 Document Reviewed: 01/11/2008 [facility] [address] Patient Information 2015 [place], [place]. This information is not intended to replace advice given to you by your health care provider. Make sure you discuss any questions you have with your health care provider.

## 2014-11-09 ENCOUNTER — Ambulatory Visit (INDEPENDENT_AMBULATORY_CARE_PROVIDER_SITE_OTHER): Payer: 59 | Admitting: Nurse Practitioner

## 2014-11-09 ENCOUNTER — Encounter: Payer: Self-pay | Admitting: Nurse Practitioner

## 2014-11-09 ENCOUNTER — Encounter (INDEPENDENT_AMBULATORY_CARE_PROVIDER_SITE_OTHER): Payer: 59

## 2014-11-09 VITALS — BP 160/90 | HR 64 | Ht 70.0 in | Wt 190.0 lb

## 2014-11-09 DIAGNOSIS — R634 Abnormal weight loss: Secondary | ICD-10-CM

## 2014-11-09 DIAGNOSIS — R42 Dizziness and giddiness: Secondary | ICD-10-CM

## 2014-11-09 DIAGNOSIS — C439 Malignant melanoma of skin, unspecified: Secondary | ICD-10-CM

## 2014-11-09 DIAGNOSIS — Z7901 Long term (current) use of anticoagulants: Secondary | ICD-10-CM

## 2014-11-09 DIAGNOSIS — Z952 Presence of prosthetic heart valve: Secondary | ICD-10-CM

## 2014-11-09 DIAGNOSIS — I639 Cerebral infarction, unspecified: Secondary | ICD-10-CM

## 2014-11-09 DIAGNOSIS — Z954 Presence of other heart-valve replacement: Secondary | ICD-10-CM

## 2014-11-09 LAB — CBC
HCT: 38 % — ABNORMAL LOW (ref 39.0–52.0)
Hemoglobin: 12.9 g/dL — ABNORMAL LOW (ref 13.0–17.0)
MCH: 28.4 pg (ref 26.0–34.0)
MCHC: 33.9 g/dL (ref 30.0–36.0)
MCV: 83.7 fL (ref 78.0–100.0)
MPV: 9.6 fL (ref 8.6–12.4)
Platelets: 323 10*3/uL (ref 150–400)
RBC: 4.54 MIL/uL (ref 4.22–5.81)
RDW: 14.7 % (ref 11.5–15.5)
WBC: 7 10*3/uL (ref 4.0–10.5)

## 2014-11-09 LAB — HEPATIC FUNCTION PANEL
ALT: 43 U/L (ref 0–53)
AST: 37 U/L (ref 0–37)
Albumin: 4.2 g/dL (ref 3.5–5.2)
Alkaline Phosphatase: 49 U/L (ref 39–117)
Bilirubin, Direct: 0.1 mg/dL (ref 0.0–0.3)
Indirect Bilirubin: 0.3 mg/dL (ref 0.2–1.2)
Total Bilirubin: 0.4 mg/dL (ref 0.2–1.2)
Total Protein: 7.4 g/dL (ref 6.0–8.3)

## 2014-11-09 LAB — BASIC METABOLIC PANEL
BUN: 13 mg/dL (ref 6–23)
CO2: 27 mEq/L (ref 19–32)
Calcium: 9.6 mg/dL (ref 8.4–10.5)
Chloride: 105 mEq/L (ref 96–112)
Creat: 0.89 mg/dL (ref 0.50–1.35)
Glucose, Bld: 81 mg/dL (ref 70–99)
Potassium: 4.1 mEq/L (ref 3.5–5.3)
Sodium: 140 mEq/L (ref 135–145)

## 2014-11-09 NOTE — Progress Notes (Addendum)
CARDIOLOGY OFFICE NOTE  Date:  11/09/2014    Tracy Marshall. Date of Birth: 24-May-1951 Medical Record #[id number]  PCP:  Orpah Melter, MD  Cardiologist:  Radford Pax  Chief Complaint  Patient presents with  . FU for multiple complaints - short of breath, fatigue, etc.    Post hospital - seen for Dr. Radford Pax    History of Present Illness: Tracy Tony. is a 64 y.o. male who presents today for a post hospital visit. Seen for Dr. Radford Pax. He has a history of mechanical AVR remotely in 2001. He has been maintained on coumadin for his anticoagulation - followed by PCP. His other issues include melanoma - 3 occurences.   He had an echo done in 05/2014 for followup of his AVF and showed mild LV dysfunction EF 45-50% and a MUGA was done to verify EF which was 45% and echo from 2012 was 52%.   Last seen by Dr. Radford Pax back in February. Had been referred back here by PCP due to some drooping of the left side of his face. Dr. Maceo Pro called yesterday stating that patient is still having neurologic issues and an MRI showed small bilateral CVAs despite being therapeutic on his coumadin - these were in both hemispheres - suggestive of embolic etiology. TEE and carotid dopplers were ordered - see below.  Comes back today. Here with his wife. He basically just wants to know what is wrong with him. He is fatigued. He has some occasional shortness of breath. Wife notes personality changes and memory issues. Wife says he is "grumpy" and this is very unusual for him. He has no appetite and has lost weight. Was previously having fever - none now.  Has not had routine colonoscopy since age 58. Only saw dermatology for his melanoma and general surgery. Has not seen neurology. He says his coumadin has been therapeutic with very little change in his dose. He is on aspirin therapy as well.  No history of AF. They are very frustrated that nothing has been definitive and the difficulty in getting information. He  has had some dizzy spells. No syncope.  Past Medical History  Diagnosis Date  . Hyperlipidemia   . TIA (transient ischemic attack)   . Atypical moles     melanomna  . Dizziness   . Cluster headaches   . Joint pain   . Gout   . Melanoma     hx of melanoma and multiple basal cell carcinomas Dr Tonia Brooms  . GERD (gastroesophageal reflux disease)   . Diverticulosis   . Fatty liver     hx of elevated hepatic transaminases-negative workup in 2009 except for U/S suggesting fatty liver  . Episodic recurrent vertigo 2002 & 2005    carotid dopplers w no clinically significant stenosis  . Positive ANA (antinuclear antibody)   . Basal cell carcinoma   . LBBB (left bundle branch block)   . S/P AVR (aortic valve replacement)     mechanical  . Chronic anticoagulation     for mechanical AVR  . CVA (cerebral vascular accident) 07/24/2014     MRI indicating 2 punctate foci of acute infarction in the l  . Bilateral carotid artery stenosis 08/03/2014    1-39% right and 40-59% left carotid artery stenosis    Past Surgical History  Procedure Laterality Date  . Cardiac valve replacement    . Cholecystectomy    . Appendectomy    . Melanoma excision  x3  . Tee without cardioversion N/A 07/31/2014    Procedure: TRANSESOPHAGEAL ECHOCARDIOGRAM (TEE);  Surgeon: Sueanne Margarita, MD;  Location: [facility] ENDOSCOPY;  Service: Cardiovascular;  Laterality: N/A;  . Tee without cardioversion N/A 10/01/2014    Procedure: TRANSESOPHAGEAL ECHOCARDIOGRAM (TEE);  Surgeon: Lelon Perla, MD;  Location: [facility] ENDOSCOPY;  Service: Cardiovascular;  Laterality: N/A;  . Dental surgery Left bone graft and extraction     Medications: Current Outpatient Prescriptions  Medication Sig Dispense Refill  . acetaminophen-codeine (TYLENOL #3) 300-30 MG per tablet Take by mouth every 4 (four) hours as needed for moderate pain.    [name] amoxicillin (AMOXIL) 875 MG tablet Take 875 mg by mouth as needed. PRIOR TO PROCEDURES  0  . aspirin 81  MG tablet Take 81 mg by mouth daily.    . colchicine 0.6 MG tablet Take 0.6 mg by mouth as needed.     . cyclobenzaprine (FLEXERIL) 10 MG tablet Take 10 mg by mouth 3 (three) times daily as needed for muscle spasms.    . meclizine (ANTIVERT) 25 MG tablet Take 25 mg by mouth as needed for dizziness.    . Multiple Vitamin (MULTIVITAMIN) capsule Take 1 capsule by mouth daily.      . pantoprazole (PROTONIX) 40 MG tablet daily.    . ranitidine (ZANTAC) 75 MG tablet Take 75 mg by mouth 2 (two) times daily.    [name] warfarin (COUMADIN) 5 MG tablet daily. 3 DAYS 5MG  4 DAYS 2.5 MG     Current Facility-Administered Medications  Medication Dose Route Frequency Provider Last Rate Last Dose  . ampicillin (OMNIPEN) 2 g in sodium chloride 0.9 % 50 mL IVPB  2 g Intravenous Once Sueanne Margarita, MD        Allergies: No Known Allergies  Social History: The patient  reports that he has never smoked. He has never used smokeless tobacco. He reports that he drinks alcohol. He reports that he does not use illicit drugs.   Family History: The patient's family history includes Cancer in his father and mother; Melanoma in an other family member; Psoriasis in an other family member.   Review of Systems: Please see the history of present illness.   Otherwise, the review of systems is positive for memory issues, fatigue, shortness of breath and personality changes.   All other systems are reviewed and negative.   Physical Exam: VS:  BP 160/90 mmHg  Pulse 64  Ht 5\' 10"  (1.778 m)  Wt 190 lb (86.183 kg)  BMI 27.26 kg/m2  SpO2 99% .  BMI Body mass index is 27.26 kg/(m^2).  Wt Readings from Last 3 Encounters:  11/09/14 190 lb (86.183 kg)  10/04/14 190 lb (86.183 kg)  10/01/14 190 lb (86.183 kg)   He weighed 201 in February of 2016  General: Pleasant. Well developed, well nourished and in no acute distress.  HEENT: Normal. Neck: Supple, no JVD, carotid bruits, or masses noted.  Cardiac: Regular rate and rhythm.  Soft systolic murmur noted. His valve sounds crisp. No edema.  Respiratory:  Lungs are clear to auscultation bilaterally with normal work of breathing.  GI: Soft and nontender.  MS: No deformity or atrophy. Gait and ROM intact. Skin: Warm and dry. Color is normal.  Neuro:  Strength and sensation are intact and no gross focal deficits noted.  Psych: Alert, appropriate and with normal affect.   LABORATORY DATA:  EKG:  EKG is not ordered today.   Lab Results  Component Value Date  WBC 13.5* 10/04/2014   HGB 15.1 10/04/2014   HCT 44.7 10/04/2014   PLT 271 10/04/2014   GLUCOSE 147* 10/04/2014   ALT 62* 10/04/2014   AST 50* 10/04/2014   NA 136 10/04/2014   K 4.3 10/04/2014   CL 101 10/04/2014   CREATININE 1.19 10/04/2014   BUN 16 10/04/2014   CO2 25 10/04/2014   INR 1.42 10/04/2014    BNP (last 3 results) No results for input(s): BNP in the last 8760 hours.  ProBNP (last 3 results) No results for input(s): PROBNP in the last 8760 hours.   Other Studies Reviewed Today:  MRI FINDINGS 06/2014: The brainstem is normal. There are a few old small vessel cerebellar infarctions. There is old small vessel infarction in the right thalamus. There is an old right temporoparietal cortical and subcortical infarction. There is a late subacute infarction in the right insula and frontal opercular region. There are a few punctate foci of acute infarction in the left posterior frontal cortical region. The patient does not show a pattern of widespread small-vessel disease. No evidence of mass lesion, hemorrhage, hydrocephalus or extra-axial collection. After contrast administration, there is some enhancement in the right insular and up articular late subacute infarction as expected. No other abnormal contrast enhancement.  No pituitary mass. No inflammatory sinus disease. No skull or skullbase lesion. Major vessels at the base of the brain show flow.  IMPRESSION: I think there are 2  punctate foci of acute infarction in the left posterior frontal cortex without hemorrhage or mass effect.  Late subacute infarction in the right insula and frontal opercular region.  Old right temporoparietal cortical and subcortical infarction.  Infarctions of different age in both hemispheres suggest embolic infarctions, possibly from the heart or ascending aorta or both carotid bifurcation regions. Further vascular evaluation suggested.  These results will be called to the ordering clinician or representative by the Radiologist Assistant, and communication documented in the PACS or zVision Dashboard.  Electronically Signed  By: Nelson Chimes M.D.  On: 07/21/2014 17:40   Myoview Impression from 07/2014 Exercise Capacity: Lexiscan with no exercise. BP Response: Normal blood pressure response. Clinical Symptoms: There is dyspnea. ECG Impression: No significant ST segment change suggestive of ischemia. Comparison with Prior Nuclear Study: No images to compare  Overall Impression: Low risk stress nuclear study Small area of apical/septal infarct no ischemia.  LV Ejection Fraction: 48%. LV Wall Motion: Apical hypokinesis mild decrease in eF  Tracy Marshall   TEE Study Conclusions from 09/2014  - Left ventricle: Systolic function was normal. The estimated ejection fraction was in the range of 50% to 55%. - Aortic valve: A mechanical prosthesis was present. There was mild regurgitation. - Mitral valve: No evidence of vegetation. There was mild regurgitation. - Left atrium: The atrium was mildly dilated. No evidence of thrombus in the atrial cavity or appendage. - Right atrium: No evidence of thrombus in the atrial cavity or appendage. - Atrial septum: There was a patent foramen ovale. There was an atrial septal aneurysm. - Tricuspid valve: No evidence of vegetation.  Impressions:  - Low normal LV function; mild LAE; prosthetic aortic valve  with mild AI; mild MR and TR; atrial septal aneurysm with positive saline microcavitation study consistent with PFO.  Carotid Dopplers Notes Recorded by Sueanne Margarita, MD on 08/03/2014 at 11:19 AM 1-39% right and 40-59% left carotid artery stenosis - repeat dopplers in 1 year  Assessment/Plan: 1. Mechanical AVR with mild systolic murmur - 2D echo showed  normally functioning AVR - on coumadin and aspirin - no evidence of vegetation  2. Chronic systemic anticoagulation - followed by his PCP  3. Abnormal MRI with prior strokes noted - has not seen neurology. Has a PFO not really described in the TEE report - does not sound like any atrial fib has been noted. This has been reviewed with Dr. Angelena Form here in the office today - he does not feel this is significant. Already on anticoagulation with warfarin. He suggests malignancy work up and referral to neurology.   4. Multitude of somatic complaints - I do not think all this is cardiac. Recent stable Myoview. Will arrange for CT chest/abdomen/pelvis. Event monitor being placed today to look for AF.  5. Prior history of melanoma - may need CT scanning to rule out metastatic disease. He has had weight loss recently.   Current medicines are reviewed with the patient today.  The patient does not have concerns regarding medicines other than what has been noted above.  The following changes have been made:  See above.  Labs/ tests ordered today include:    Orders Placed This Encounter  Procedures  . CT Chest W Contrast  . CT Abdomen Pelvis W Wo Contrast  . Basic metabolic panel  . CBC  . Hepatic function panel  . TSH  . Ambulatory referral to Neurology  . Cardiac event monitor     Disposition:   Further disposition to follow.   Patient is agreeable to this plan and will call if any problems develop in the interim.   Signed: Burtis Junes, RN, ANP-C 11/09/2014 4:37 PM  [facility] [address] [place], [place]  [address] Phone: [phone] Fax: [phone]

## 2014-11-09 NOTE — Patient Instructions (Signed)
We will be checking the following labs today - BMET, CBC, HPF, TSH   Medication Instructions:    Continue with your current medicines.     Testing/Procedures To Be Arranged:  CT chest/abdomen/pelvis  Event monitor  Referral to neurology  Follow-Up:   We will see what your tests show    Other Special Instructions:   N/A  Call the [place] office at [phone] if you have any questions, problems or concerns.

## 2014-11-09 NOTE — Addendum Note (Signed)
Addended by: Burtis Junes on: 11/09/2014 04:45 PM   Modules accepted: Orders

## 2014-11-10 LAB — TSH: TSH: 1.586 u[IU]/mL (ref 0.350–4.500)

## 2014-11-14 ENCOUNTER — Ambulatory Visit (INDEPENDENT_AMBULATORY_CARE_PROVIDER_SITE_OTHER): Payer: 59 | Admitting: Neurology

## 2014-11-14 ENCOUNTER — Encounter: Payer: Self-pay | Admitting: Neurology

## 2014-11-14 VITALS — BP 124/78 | HR 73 | Ht 70.0 in | Wt 189.6 lb

## 2014-11-14 DIAGNOSIS — I639 Cerebral infarction, unspecified: Secondary | ICD-10-CM

## 2014-11-14 DIAGNOSIS — G609 Hereditary and idiopathic neuropathy, unspecified: Secondary | ICD-10-CM

## 2014-11-14 DIAGNOSIS — I6340 Cerebral infarction due to embolism of unspecified cerebral artery: Secondary | ICD-10-CM | POA: Diagnosis not present

## 2014-11-14 LAB — VITAMIN B12: Vitamin B-12: 822 pg/mL (ref 211–911)

## 2014-11-14 LAB — LIPID PANEL
CHOL/HDL RATIO: 4.4 ratio
Cholesterol: 210 mg/dL — ABNORMAL HIGH (ref 0–200)
HDL: 48 mg/dL (ref >=40)
LDL CALC: 119 mg/dL — AB (ref 0–99)
Triglycerides: 213 mg/dL — ABNORMAL HIGH (ref <150)
VLDL: 43 mg/dL — ABNORMAL HIGH (ref 0–40)

## 2014-11-14 LAB — HEMOGLOBIN A1C
HEMOGLOBIN A1C: 6 % — AB (ref <5.7)
Mean Plasma Glucose: 126 mg/dL — ABNORMAL HIGH (ref <117)

## 2014-11-14 MED ORDER — PRAVASTATIN SODIUM 20 MG PO TABS: 20.0000 mg | ORAL_TABLET | Freq: Every day | ORAL | Status: DC

## 2014-11-14 NOTE — Patient Instructions (Addendum)
1.  Start pravastatin 20mg  daily 2.  Check lipid panel, B12, HbA1c and vitamin D. Your provider has requested that you have labwork completed today. Please go to [facility] on the first floor of this building before leaving the office today. 3.  Encouraged to reduce alcohol intake 4.  Return to clinic in 2 -months   You will always be at an increased risk of stroke.  Stroke is a medical emergency.  Know these warning signs of stroke. Every second counts:  Sudden numbness or weakness of the face, arm or leg, especially on one side of the body,  Sudden confusion, trouble speaking or understanding,  Sudden trouble seeing in one eye or both eyes,  Sudden trouble walking, dizziness, loss of balance or coordination,  Sudden severe headache with no known cause.  If you or someone with you has one or more of these signs, don't delay!  Immediately call [id number], or the emergency medical services (EMS) number so an ambulance (ideally with advanced life support) can be sent for you.  Also, check the time so that you will know when the symptoms first appeared. It's very important to take immediate action. Medical treatment may be available if action is taken early enough.   General guidelines for stroke risk factor management, if present  Hypertension  Target blood pressure <140/90, <130/80 for high risk; normal is 120/80  Hyperlipidemia  Target total cholesterol < 200  Target LDL <100, < 70 for high risk  Target HDL >45 for men, >55 for women  Target triglycerides <150  Diabetes  Target HgbA1c <7%  Smoking  Target is smoking cessation  Physical inactivity  Target is exercise at least 3 times per week  Target waist circumference, in inches is <35 for women and <40 for men

## 2014-11-14 NOTE — Progress Notes (Addendum)
[facility] Clinic Note - Initial Visit   Date: 11/14/2014   Tracy Marshall. MRN: [id number] DOB: 12-29-1950   Dear Truitt Merle, NP:   Thank you for your kind referral of Tracy Marshall. for consultation of embolic stroke. Although his history is well known to you, please allow [place] to reiterate it for the purpose of our medical record. The patient was accompanied to the clinic by wife who also provides collateral information.     History of Present Illness: Tracy Marshall. is a 64 y.o. 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    In 2001, he developed an episode of slurred speech lasting a few minutes.  This occurred several weeks after his valve replacement and was told he had a TIA. He had no residual deficits.  In December 2015, he was visiting his brother and a family member had noticed that he developed left facial droop. During the same time, he recalls being very ill and having fevers and was taking a lot of over-the-counter therapeutic medication. He underwent MRI brain in January which showed subacute right insula infarct with 2 foci of punctate left frontal cortex infarcts.  There is evidence of old infarcts involving the right temporoparietal region also.  These findings are most concerning for central embolic source.  However, his TEE did not show any thrombus or unstable plaque of the aortic arch.  His left facial weakness gradually improved over the next few months. Currently, he denies any neurological deficits.   His wife reports that she has noticed some cognitive slowing and mood changes, described as excitable and grumpy. Denies excessive yawning, or inappropriate crying/laughter.   In the past, he reports having spells of  blurry vision, lasting about 10-min which is followed by a severe headache. Duration is about 1-2 days, occuring about once per month.  He has associated phonophobia, no photophobia, nausea/vomiting.  He had a tooth extracted in May and was bridged to Lovenox and developed a blood clot in his urinary tract.   Past Medical History  Diagnosis Date  . Hyperlipidemia   . TIA (transient ischemic attack)   . Atypical moles     melanomna  . Dizziness   . Cluster headaches   . Joint pain   . Gout   . Melanoma     hx of melanoma and multiple basal cell carcinomas Dr Tonia Brooms  . GERD (gastroesophageal reflux disease)   . Diverticulosis   . Fatty liver     hx of elevated hepatic transaminases-negative workup in 2009 except for U/S suggesting fatty liver  . Episodic recurrent vertigo 2002 & 2005    carotid dopplers w no clinically significant stenosis  . Positive ANA (antinuclear antibody)   . Basal cell carcinoma   . LBBB (left bundle branch block)   . S/P AVR (aortic valve replacement)     mechanical  . Chronic anticoagulation     for mechanical AVR  . CVA (cerebral vascular accident) 07/24/2014     MRI indicating 2 punctate foci of acute infarction in the l  . Bilateral carotid artery stenosis 08/03/2014    1-39% right and 40-59% left carotid artery stenosis    Past Surgical History  Procedure Laterality Date  . Cardiac valve replacement    . Cholecystectomy    .  Appendectomy    . Melanoma excision      x3  . Tee without cardioversion N/A 07/31/2014    Procedure: TRANSESOPHAGEAL ECHOCARDIOGRAM (TEE);  Surgeon: Sueanne Margarita, MD;  Location: [facility] ENDOSCOPY;  Service: Cardiovascular;  Laterality: N/A;  . Tee without cardioversion N/A 10/01/2014    Procedure: TRANSESOPHAGEAL ECHOCARDIOGRAM (TEE);  Surgeon: Lelon Perla, MD;  Location: [facility] ENDOSCOPY;  Service: Cardiovascular;  Laterality: N/A;  . Dental surgery Left bone graft and extraction     Medications:  Current Outpatient  Prescriptions on File Prior to Visit  Medication Sig Dispense Refill  . aspirin 81 MG tablet Take 81 mg by mouth daily.    . colchicine 0.6 MG tablet Take 0.6 mg by mouth as needed.     . cyclobenzaprine (FLEXERIL) 10 MG tablet Take 10 mg by mouth 3 (three) times daily as needed for muscle spasms.    . Multiple Vitamin (MULTIVITAMIN) capsule Take 1 capsule by mouth daily.      . pantoprazole (PROTONIX) 40 MG tablet daily.    . ranitidine (ZANTAC) 75 MG tablet Take 75 mg by mouth 2 (two) times daily.    [name] warfarin (COUMADIN) 5 MG tablet daily. 3 DAYS 5MG  4 DAYS 2.5 MG     Current Facility-Administered Medications on File Prior to Visit  Medication Dose Route Frequency Provider Last Rate Last Dose  . ampicillin (OMNIPEN) 2 g in sodium chloride 0.9 % 50 mL IVPB  2 g Intravenous Once Sueanne Margarita, MD        Allergies: No Known Allergies  Family History: Family History  Problem Relation Age of Onset  . Melanoma    . Psoriasis    . Cancer Mother     melanoma, ovarian  . Cancer Father     prostate    Social History: History   Social History  . Marital Status: Married    Spouse Name: N/A  . Number of Children: N/A  . Years of Education: N/A   Occupational History  . Not on file.   Social History Main Topics  . Smoking status: Never Smoker   . Smokeless tobacco: Never Used  . Alcohol Use: 0.0 oz/week    0 Standard drinks or equivalent per week     Comment: moderate  . Drug Use: No  . Sexual Activity: Not on file   Other Topics Concern  . Not on file   Social History Narrative   Lives with wife in a one story home.  No children.  Retired from [employer].    Review of Systems:  CONSTITUTIONAL: No fevers, chills, night sweats, or weight loss.   EYES: No visual changes or eye pain ENT: No hearing changes.  No history of nose bleeds.   RESPIRATORY: No cough, wheezing and shortness of breath.   CARDIOVASCULAR: Negative for chest pain, and palpitations.   GI:  Negative for abdominal discomfort, blood in stools or black stools.  No recent change in bowel habits.   GU:  No history of incontinence.   MUSCLOSKELETAL: No history of joint pain or swelling.  No myalgias.   SKIN: Negative for lesions, rash, and itching.   HEMATOLOGY/ONCOLOGY: Negative for prolonged bleeding, bruising easily, and swollen nodes.  +history of cancer.   ENDOCRINE: Negative for cold or heat intolerance, polydipsia or goiter.   PSYCH:  No depression or anxiety symptoms.   NEURO: As Above.   Vital Signs:  BP 124/78 mmHg  Pulse 73  Ht 5\' 10"  (  1.778 m)  Wt 189 lb 9 oz (85.985 kg)  BMI 27.20 kg/m2  SpO2 96%   General Medical Exam:   General:  Well appearing, comfortable.   Eyes/ENT: see cranial nerve examination.   Neck: No masses appreciated.  Full range of motion without tenderness.  No carotid bruits. Respiratory:  Clear to auscultation, good air entry bilaterally.   Cardiac:  Loud aortic click from mechanical valve, regular rate and rhythm, no murmur.   Extremities:  No deformities, edema, or skin discoloration.  Skin:  No rashes or lesions.  Neurological Exam: MENTAL STATUS including orientation to time, place, person, recent and remote memory, attention span and concentration, language, and fund of knowledge is normal.  Speech is not dysarthric.  CRANIAL NERVES: II:  No visual field defects.  Unremarkable fundi.   III-IV-VI: Pupils equal round and reactive to light.  Normal conjugate, extra-ocular eye movements in all directions of gaze.  No nystagmus.  No ptosis.   V:  Normal facial sensation.   VII:  Normal facial symmetry and movements.  No pathologic facial reflexes.  VIII:  Normal hearing and vestibular function.   IX-X:  Normal palatal movement.   XI:  Normal shoulder shrug and head rotation.   XII:  Normal tongue strength and range of motion, no deviation or fasciculation.  MOTOR:  No atrophy, fasciculations or abnormal movements.  No pronator drift.   Tone is normal.    Right Upper Extremity:    Left Upper Extremity:    Deltoid  5/5   Deltoid  5/5   Biceps  5/5   Biceps  5/5   Triceps  5/5   Triceps  5/5   Wrist extensors  5/5   Wrist extensors  5/5   Wrist flexors  5/5   Wrist flexors  5/5   Finger extensors  5/5   Finger extensors  5/5   Finger flexors  5/5   Finger flexors  5/5   Dorsal interossei  5/5   Dorsal interossei  5/5   Abductor pollicis  5/5   Abductor pollicis  5/5   Tone (Ashworth scale)  0  Tone (Ashworth scale)  0   Right Lower Extremity:    Left Lower Extremity:    Hip flexors  5/5   Hip flexors  5/5   Hip extensors  5/5   Hip extensors  5/5   Knee flexors  5/5   Knee flexors  5/5   Knee extensors  5/5   Knee extensors  5/5   Dorsiflexors  5/5   Dorsiflexors  5/5   Plantarflexors  5/5   Plantarflexors  5/5   Toe extensors  5/5   Toe extensors  5/5   Toe flexors  5/5   Toe flexors  5/5   Tone (Ashworth scale)  0  Tone (Ashworth scale)  0   MSRs:  Right                                                                 Left brachioradialis 2+  brachioradialis 2+  biceps 2+  biceps 2+  triceps 2+  triceps 2+  patellar 2+  patellar 2+  ankle jerk 2+  ankle jerk 2+  Hoffman no  Hoffman no  plantar response down  plantar response down   SENSORY:  Vibration and temperature reduced at the ankles bilaterally. Pinprick, light touch, and proprioception is intact. Romberg sign is negative.   COORDINATION/GAIT: Normal finger-to- nose-finger and heel-to-shin.  Intact rapid alternating movements bilaterally.  Able to rise from a chair without using arms.  Gait narrow based and stable. Tandem and stressed gait intact.   NIH stroke scale:  0/44  DATA: MRI brain wo contrast 07/21/2014:  2 punctate foci of acute infarction in the left posterior frontal cortex without hemorrhage or mass effect.  Late subacute infarction in the right insula and frontal opercular region.  Old right temporoparietal cortical and subcortical  infarction.  Infarctions of different age in both hemispheres suggest embolic infarctions, possibly from the heart or ascending aorta or both carotid bifurcation regions.   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  Carotid Dopplers 08/03/2014:  1-39% right and 40-59% left carotid artery stenosis - repeat dopplers in 1 year  IMPRESSION: [name] is a 64 year-old gentleman with mechanical AVR on chronic anticoagulation with coumadin presenting for evaluation of left facial droop in the setting of embolic bihemispheric strokes (January 2016).  Of note, he does not have any new neurological symptoms and his facial droop has resolved.  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  Otherwise, there is no lateralizing findings, including facial asymmetric.  His work-up to-date has been extensive and included MRI brain, surface echo, TEE, US carotids, and he is undergoing cardiac monitoring currently.  MRI showed subacute right insula infarct with 2 foci of punctate left frontal cortex infarcts.  There is evidence of old infarcts involving the right temporoparietal region also.  These findings are most concerning for central embolic source.  However, his TEE did not show any thrombus or unstable plaque of the aortic arch.  There is a small PFO, so would be reasonable to look for malignancy with CT chest/abdomen/pelvis.  Although there is mild-moderate left ICA stenosis, this would not explain his right-sided infarcts.    We had a lengthy discussion regarding the various causes, including the possibility of being subtherapeutic in the setting of an acute systemic illness.   I agree with the plan outlined by cardiology and recommend evaluating for  malignancy as well as cardiac arrythmias.  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   PLAN/RECOMMENDATIONS:  1.  Start pravastatin 20mg  daily 2.  Continue anticoagulation with coumadin and low-dose aspirin 3.  Check lipid panel, B12, HbA1c and vitamin D 3.  Follow-up with extended cardiac monitor and CT chest/abd/pelvis results 4.  CTA head, if any new symptoms 5.  Stroke warning signs discussed 6.  Encouraged to reduce alcohol intake 7.  If new behavior changes, low threshold to get EEG  8.  Return to clinic in 2 months, will get [place] test at next visit   The duration of this appointment visit was 60 minutes of face-to-face time with the patient.  Greater than 50% of this time was spent in counseling, explanation of diagnosis, planning of further management, and coordination of care.   Thank you for allowing me to participate in patient's care.  If I can answer any additional questions, I would be pleased to do so.  Sincerely,    Donika K. Posey Pronto, DO

## 2014-11-15 LAB — VITAMIN D 25 HYDROXY (VIT D DEFICIENCY, FRACTURES): Vit D, 25-Hydroxy: 52 ng/mL (ref 30–100)

## 2014-11-16 ENCOUNTER — Other Ambulatory Visit: Payer: Self-pay | Admitting: Nurse Practitioner

## 2014-11-16 ENCOUNTER — Ambulatory Visit (INDEPENDENT_AMBULATORY_CARE_PROVIDER_SITE_OTHER)
Admission: RE | Admit: 2014-11-16 | Discharge: 2014-11-16 | Disposition: A | Discharge status: Home / Self Care | Payer: 59 | Source: Ambulatory Visit | Attending: Nurse Practitioner | Admitting: Nurse Practitioner

## 2014-11-16 DIAGNOSIS — Z954 Presence of other heart-valve replacement: Secondary | ICD-10-CM

## 2014-11-16 DIAGNOSIS — R634 Abnormal weight loss: Secondary | ICD-10-CM

## 2014-11-16 DIAGNOSIS — Z952 Presence of prosthetic heart valve: Secondary | ICD-10-CM

## 2014-11-16 DIAGNOSIS — Z85820 Personal history of malignant melanoma of skin: Secondary | ICD-10-CM

## 2014-11-16 DIAGNOSIS — I639 Cerebral infarction, unspecified: Secondary | ICD-10-CM | POA: Diagnosis not present

## 2014-11-16 DIAGNOSIS — I712 Thoracic aortic aneurysm, without rupture, unspecified: Secondary | ICD-10-CM

## 2014-11-16 DIAGNOSIS — C439 Malignant melanoma of skin, unspecified: Secondary | ICD-10-CM

## 2014-11-16 DIAGNOSIS — Z7901 Long term (current) use of anticoagulants: Secondary | ICD-10-CM

## 2014-11-16 MED ORDER — IOHEXOL 300 MG/ML  SOLN
100.0000 mL | Freq: Once | INTRAMUSCULAR | Status: AC | PRN
  Administered 2014-11-16: 100 mL via INTRAVENOUS

## 2014-11-27 ENCOUNTER — Encounter: Payer: Self-pay | Admitting: Thoracic Surgery (Cardiothoracic Vascular Surgery)

## 2014-11-27 ENCOUNTER — Institutional Professional Consult (permissible substitution) (INDEPENDENT_AMBULATORY_CARE_PROVIDER_SITE_OTHER): Payer: 59 | Admitting: Thoracic Surgery (Cardiothoracic Vascular Surgery)

## 2014-11-27 VITALS — BP 121/86 | HR 69 | Resp 16 | Ht 70.0 in | Wt 190.0 lb

## 2014-11-27 DIAGNOSIS — Z952 Presence of prosthetic heart valve: Secondary | ICD-10-CM

## 2014-11-27 DIAGNOSIS — Z954 Presence of other heart-valve replacement: Secondary | ICD-10-CM | POA: Diagnosis not present

## 2014-11-27 DIAGNOSIS — I712 Thoracic aortic aneurysm, without rupture, unspecified: Secondary | ICD-10-CM | POA: Insufficient documentation

## 2014-11-27 NOTE — Progress Notes (Signed)
PCP is Orpah Melter, MD Referring Provider is Burtis Junes, NP  Chief Complaint  Patient presents with  . TAA    CT CHEST 11/16/14    HPI: 64 year old man sent for consultation regarding an ascending aortic aneurysm.  Tracy Marshall is a 64 year old gentleman with a history of aortic valve replacement in 2001 in [place]. (Mechanical valve, surgery at [facility] in [place]). When asked whether he had a bicuspid valve he is not sure, but that sounds but vaguely familiar to him. He does not know if his ascending aorta was noted to be enlarged at that time. He had a TIA manifested with slurred speech shortly after his valve replacement, but had a full recovery.  He moved to [place] a few years ago for work but is now retired.  He was in his usual state of health until this past winter. He was visiting with family and they noticed a left facial droop. An MR showed bilateral small strokes. He has had an extensive workup for that including carotid duplex, transesophageal echocardiography, and CT angiography. Carotid duplex showed a 1-39% stenosis on the right and a 40-59% stenosis on the left side. TEE showed the mechanical aortic valve prosthesis was working normally with mild aortic insufficiency. There was a small PFO. There was no intracardiac source identified. No plaque was noted in the arch or ascending aorta. His CT angiogram showed an ascending aortic aneurysm measured 4.5 cm at the level of the right pulmonary artery.  His left facial droop resolved.  He does note that he was transitioned from Coumadin and bridged with Lovenox prior to a dental extraction in January.   Past Medical History  Diagnosis Date  . Hyperlipidemia   . TIA (transient ischemic attack)   . Atypical moles     melanomna  . Dizziness   . Cluster headaches   . Joint pain   . Gout   . Melanoma     hx of melanoma and multiple basal cell carcinomas Dr Tonia Brooms  . GERD (gastroesophageal  reflux disease)   . Diverticulosis   . Fatty liver     hx of elevated hepatic transaminases-negative workup in 2009 except for U/S suggesting fatty liver  . Episodic recurrent vertigo 2002 & 2005    carotid dopplers w no clinically significant stenosis  . Positive ANA (antinuclear antibody)   . Basal cell carcinoma   . LBBB (left bundle branch block)   . S/P AVR (aortic valve replacement)     mechanical  . Chronic anticoagulation     for mechanical AVR  . CVA (cerebral vascular accident) 07/24/2014     MRI indicating 2 punctate foci of acute infarction in the l  . Bilateral carotid artery stenosis 08/03/2014    1-39% right and 40-59% left carotid artery stenosis    Past Surgical History  Procedure Laterality Date  . Cardiac valve replacement    . Cholecystectomy    . Appendectomy    . Melanoma excision      x3  . Tee without cardioversion N/A 07/31/2014    Procedure: TRANSESOPHAGEAL ECHOCARDIOGRAM (TEE);  Surgeon: Sueanne Margarita, MD;  Location: [facility] ENDOSCOPY;  Service: Cardiovascular;  Laterality: N/A;  . Tee without cardioversion N/A 10/01/2014    Procedure: TRANSESOPHAGEAL ECHOCARDIOGRAM (TEE);  Surgeon: Lelon Perla, MD;  Location: [facility] ENDOSCOPY;  Service: Cardiovascular;  Laterality: N/A;  . Dental surgery Left bone graft and extraction    Family History  Problem Relation Age of Onset  .  Melanoma    . Psoriasis    . Cancer Mother     melanoma, ovarian  . Cancer Father     prostate  . Valvular heart disease Brother   . Scoliosis Sister     Social History History  Substance Use Topics  . Smoking status: Never Smoker   . Smokeless tobacco: Never Used  . Alcohol Use: 0.0 oz/week    0 Standard drinks or equivalent per week     Comment: moderate - 2-3 drinks about 3 times per week of wine, liquor, beer    Current Outpatient Prescriptions  Medication Sig Dispense Refill  . aspirin 81 MG tablet Take 81 mg by mouth daily.    . colchicine 0.6 MG tablet Take 0.6 mg by  mouth as needed.     . cyclobenzaprine (FLEXERIL) 10 MG tablet Take 10 mg by mouth 3 (three) times daily as needed for muscle spasms.    . Multiple Vitamin (MULTIVITAMIN) capsule Take 1 capsule by mouth daily.      . pantoprazole (PROTONIX) 40 MG tablet daily.    . pravastatin (PRAVACHOL) 20 MG tablet Take 1 tablet (20 mg total) by mouth daily. (Patient taking differently: Take 40 mg by mouth daily. ) 90 tablet 3  . ranitidine (ZANTAC) 75 MG tablet Take 75 mg by mouth 2 (two) times daily.    [name] warfarin (COUMADIN) 5 MG tablet daily. 3 DAYS 5MG  4 DAYS 2.5 MG    . enoxaparin (LOVENOX) 100 MG/ML injection Inject 100 mg into the skin. FOR BRIDGING WARFARIN FOR PROCEDURES     Current Facility-Administered Medications  Medication Dose Route Frequency Provider Last Rate Last Dose  . ampicillin (OMNIPEN) 2 g in sodium chloride 0.9 % 50 mL IVPB  2 g Intravenous Once Sueanne Margarita, MD        No Known Allergies  Review of Systems  Constitutional: Positive for appetite change, fatigue and unexpected weight change (lost 15 pounds in 6 months).  HENT: Positive for dental problem (tooth extraction in January) and trouble swallowing. Negative for voice change.   Eyes: Positive for visual disturbance (blurred, associated with headaches).  Respiratory: Positive for shortness of breath (with exertion (playing golf)). Negative for cough and wheezing.   Cardiovascular: Negative for chest pain, palpitations and leg swelling.  Gastrointestinal:       Difficulty swallowing Hiatal hernia noted during TEE  Genitourinary: Positive for hematuria (during bridge with lovenox).  Neurological: Positive for facial asymmetry (left facial droop - resolved) and headaches. Negative for dizziness, seizures, syncope, speech difficulty, weakness and light-headedness.  Hematological: Bruises/bleeds easily.  Psychiatric/Behavioral:       Mood swings  All other systems reviewed and are negative.   BP 121/86 mmHg  Pulse 69   Resp 16  Ht 5\' 10"  (1.778 m)  Wt 190 lb (86.183 kg)  BMI 27.26 kg/m2  SpO2 98% Physical Exam  Constitutional: He is oriented to person, place, and time. He appears well-developed and well-nourished. No distress.  HENT:  Head: Normocephalic and atraumatic.  Eyes: EOM are normal. Pupils are equal, round, and reactive to light.  Neck: Neck supple. No thyromegaly present.  Cardiovascular: Normal rate and regular rhythm.  Exam reveals no gallop and no friction rub.   Murmur (1-7/7 systolic, good valve click) heard. Pulmonary/Chest: Effort normal and breath sounds normal. He has no wheezes. He has no rales.  Abdominal: Soft. There is no tenderness.  Musculoskeletal: Normal range of motion. He exhibits no edema.  Lymphadenopathy:  He has no cervical adenopathy.  Neurological: He is alert and oriented to person, place, and time. No cranial nerve deficit.  No focal motor deficit  Skin: Skin is warm and dry.  Psychiatric: He has a normal mood and affect.  Vitals reviewed.    Diagnostic Tests: CT CHEST, ABDOMEN, AND PELVIS WITH CONTRAST  TECHNIQUE: Multidetector CT imaging of the chest, abdomen and pelvis was performed following the standard protocol during bolus administration of intravenous contrast.  CONTRAST: 113mL OMNIPAQUE IOHEXOL 300 MG/ML SOLN  COMPARISON: 10/04/2014  FINDINGS: CT CHEST FINDINGS  Mediastinum/Nodes: Prior CABG. Aortic valve prosthesis. Ascending aortic aneurysm, 4.5 cm diameter.  Right hilar lymph node, 0.9 cm in short axis diameter, image 27 series 2. No pathologic thoracic adenopathy identified.  Small type 1 hiatal hernia.  Lungs/Pleura: Unremarkable  Musculoskeletal: Thoracic spondylosis. Prior median sternotomy.  CT ABDOMEN PELVIS FINDINGS  Hepatobiliary: Diffuse hepatic steatosis. Cholecystectomy.  Pancreas: Unremarkable  Spleen: Unremarkable  Adrenals/Urinary Tract: Bilateral renal cysts are present along  with several hypodense lesions in the kidneys which are technically too small to characterize although statistically likely to be cysts.  In addition, there is scattered scarring and left kidneys along with a 2.0 by 1.0 cm band of hypodensity in the left mid kidney posteriorly (images 11-15 of series 5) which is technically nonspecific.  No urinary tract stones are appreciated. No hydronephrosis or hydroureter.  Stomach/Bowel: Prominent stool throughout the colon favors constipation. Scattered sigmoid colon and descending colon diverticula.  Vascular/Lymphatic: Unremarkable  Reproductive: Punctate calcification in the anterior prostate apex, likely incidental.  Other: No supplemental non-categorized findings.  Musculoskeletal: Loss of disc height at L5-S1. Facet spurring, and disc bulge, and potential minimal anterolisthesis at L4-5.  IMPRESSION: 1. No compelling findings of recurrent melanoma. 2. Scattered scarring in the left kidney along with a 2.0 by 1.0 cm band of hyperdensity posteriorly in the left mid kidney which is nonspecific, and could relate to prior adjacent scarring but could conceivably represent active inflammation, for example in setting of an acute small renal infarct or pyelonephritis. Correlate with urine analysis. 3. Bilateral renal cysts. Some of the smaller renal hypodense lesions are technically too small to characterize. 4. Diffuse hepatic steatosis. 5. Ascending aortic aneurysm, 4.5 cm diameter. Recommend semi-annual imaging followup by CTA or MRA and referral to cardiothoracic surgery if not already obtained. This recommendation follows 2010 ACCF/AHA/AATS/ACR/ASA/SCA/SCAI/SIR/STS/SVM Guidelines for the Diagnosis and Management of Patients With Thoracic Aortic Disease. Circulation. 2010; 121: e266-e369 6. Small type 1 hiatal hernia. 7. Prominent stool throughout the colon favors constipation. 8. Sigmoid colon and descending colonic  diverticulosis.   Electronically Signed  By: Tracy Marshall M.D.  On: 11/16/2014 15:12  Transesophageal echocardiography LV EF: 50% -  55%  ------------------------------------------------------------------- Study Conclusions  - Left ventricle: Systolic function was normal. The estimated ejection fraction was in the range of 50% to 55%. - Aortic valve: A mechanical prosthesis was present. There was mild regurgitation. - Mitral valve: No evidence of vegetation. There was mild regurgitation. - Left atrium: The atrium was mildly dilated. No evidence of thrombus in the atrial cavity or appendage. - Right atrium: No evidence of thrombus in the atrial cavity or appendage. - Atrial septum: There was a patent foramen ovale. There was an atrial septal aneurysm. - Tricuspid valve: No evidence of vegetation.  Impressions:  - Low normal LV function; mild LAE; prosthetic aortic valve with mild AI; mild MR and TR; atrial septal aneurysm with positive saline microcavitation study consistent with PFO.  Impression: Tracy Marshall is a 64 year old man with a history of aortic valve replacement with a mechanical valve in 2001, who has been found to have a 4.5 cm ascending aortic aneurysm.  My suspicion is that he had a bicuspid aortic valve, which would explain the need for aortic valve replacement such a young age. We will try to obtain a copy of the operative note from [facility].  His aorta has normal morphology but the ascending aorta is diffusely enlarged. In coronal and sagittal views it measures between 4.1 and 4.2 cm. The 4.5 cm measurement may be slightly tangential. In any event it does not meet size criteria for surgical repair presently.  He had questions as to whether this could be a potential source for his strokes. Certainly that is possible, although he has multiple other potential sources including his aortic valve, a small PFO, and carotid disease. There was  no major plaque noted on TEE in the ascending aorta or arch. I do not think there is sufficient suspicion to warrant surgical resection based on his embolic strokes.  His blood pressure is well controlled and he has no history of hypertension.  My recommendation was that we follow his ascending aorta with CT angiogram in 6 months.  Plan: Return in 6 months with CT angiogram chest  Melrose Nakayama, MD [facility] [phone] I spent 30 minutes with Tracy Marshall during this visit

## 2014-12-19 ENCOUNTER — Emergency Department (HOSPITAL_BASED_OUTPATIENT_CLINIC_OR_DEPARTMENT_OTHER)
Admission: EM | Admit: 2014-12-19 | Discharge: 2014-12-19 | Disposition: A | Discharge status: Home / Self Care | Payer: 59 | Attending: Emergency Medicine | Admitting: Emergency Medicine

## 2014-12-19 ENCOUNTER — Encounter (HOSPITAL_BASED_OUTPATIENT_CLINIC_OR_DEPARTMENT_OTHER): Payer: Self-pay

## 2014-12-19 DIAGNOSIS — Z79899 Other long term (current) drug therapy: Secondary | ICD-10-CM | POA: Insufficient documentation

## 2014-12-19 DIAGNOSIS — K219 Gastro-esophageal reflux disease without esophagitis: Secondary | ICD-10-CM | POA: Diagnosis not present

## 2014-12-19 DIAGNOSIS — E785 Hyperlipidemia, unspecified: Secondary | ICD-10-CM | POA: Insufficient documentation

## 2014-12-19 DIAGNOSIS — Z7901 Long term (current) use of anticoagulants: Secondary | ICD-10-CM | POA: Insufficient documentation

## 2014-12-19 DIAGNOSIS — Z8673 Personal history of transient ischemic attack (TIA), and cerebral infarction without residual deficits: Secondary | ICD-10-CM | POA: Insufficient documentation

## 2014-12-19 DIAGNOSIS — M109 Gout, unspecified: Secondary | ICD-10-CM | POA: Insufficient documentation

## 2014-12-19 DIAGNOSIS — Z85828 Personal history of other malignant neoplasm of skin: Secondary | ICD-10-CM | POA: Diagnosis not present

## 2014-12-19 DIAGNOSIS — Z954 Presence of other heart-valve replacement: Secondary | ICD-10-CM | POA: Insufficient documentation

## 2014-12-19 DIAGNOSIS — M1990 Unspecified osteoarthritis, unspecified site: Secondary | ICD-10-CM | POA: Insufficient documentation

## 2014-12-19 DIAGNOSIS — R7989 Other specified abnormal findings of blood chemistry: Secondary | ICD-10-CM | POA: Diagnosis present

## 2014-12-19 DIAGNOSIS — Z85820 Personal history of malignant melanoma of skin: Secondary | ICD-10-CM | POA: Diagnosis not present

## 2014-12-19 DIAGNOSIS — R791 Abnormal coagulation profile: Secondary | ICD-10-CM | POA: Diagnosis not present

## 2014-12-19 DIAGNOSIS — Z7982 Long term (current) use of aspirin: Secondary | ICD-10-CM | POA: Insufficient documentation

## 2014-12-19 HISTORY — DX: Unspecified osteoarthritis, unspecified site: M19.90

## 2014-12-19 LAB — CBC WITH DIFFERENTIAL/PLATELET
BASOS ABS: 0 10*3/uL (ref 0.0–0.1)
BASOS PCT: 1 % (ref 0–1)
Eosinophils Absolute: 0.2 10*3/uL (ref 0.0–0.7)
Eosinophils Relative: 3 % (ref 0–5)
HCT: 35.8 % — ABNORMAL LOW (ref 39.0–52.0)
Hemoglobin: 11.9 g/dL — ABNORMAL LOW (ref 13.0–17.0)
LYMPHS PCT: 35 % (ref 12–46)
Lymphs Abs: 2.3 10*3/uL (ref 0.7–4.0)
MCH: 27.4 pg (ref 26.0–34.0)
MCHC: 33.2 g/dL (ref 30.0–36.0)
MCV: 82.3 fL (ref 78.0–100.0)
Monocytes Absolute: 0.6 10*3/uL (ref 0.1–1.0)
Monocytes Relative: 9 % (ref 3–12)
Neutro Abs: 3.5 10*3/uL (ref 1.7–7.7)
Neutrophils Relative %: 52 % (ref 43–77)
PLATELETS: 277 10*3/uL (ref 150–400)
RBC: 4.35 MIL/uL (ref 4.22–5.81)
RDW: 14 % (ref 11.5–15.5)
WBC: 6.5 10*3/uL (ref 4.0–10.5)

## 2014-12-19 LAB — BASIC METABOLIC PANEL
ANION GAP: 10 (ref 5–15)
BUN: 19 mg/dL (ref 6–20)
CHLORIDE: 107 mmol/L (ref 101–111)
CO2: 23 mmol/L (ref 22–32)
Calcium: 9.3 mg/dL (ref 8.9–10.3)
Creatinine, Ser: 0.98 mg/dL (ref 0.61–1.24)
GFR calc non Af Amer: >60 mL/min (ref >60)
Glucose, Bld: 115 mg/dL — ABNORMAL HIGH (ref 65–99)
POTASSIUM: 3.7 mmol/L (ref 3.5–5.1)
Sodium: 140 mmol/L (ref 135–145)

## 2014-12-19 LAB — PROTIME-INR
INR: 4.48 — ABNORMAL HIGH (ref 0.00–1.49)
PROTHROMBIN TIME: 41.4 s — AB (ref 11.6–15.2)

## 2014-12-19 NOTE — ED Notes (Signed)
INR 7.3-labs drawn this am

## 2014-12-19 NOTE — Discharge Instructions (Signed)
As discussed hold your coumadin tonight. And talk to your doctor tomorrow and have you level checked on Friday Vitamin K Foods and Warfarin Warfarin is a medicine that helps prevent harmful blood clots by causing blood to clot more slowly. It does this by decreasing the activity of vitamin K, which promotes normal blood clotting. For the dose of warfarin you have been prescribed to work well, you need to get about the same amount of vitamin K from your food from day to day. Suddenly getting a lot more vitamin K could cause your blood to clot too quickly. A sudden decrease in vitamin K intake could cause your blood to clot too slowly. These changes in vitamin K intake could lead to dangerous blood clotsor to bleeding. WHAT GENERAL GUIDELINES DO I NEED TO FOLLOW?  Keep your intake of vitamin K consistent from day to day. To do this, you must be aware of which foods contain moderate or high amounts of vitamin K. Listed below are some foods that are very high, high, or moderately high in vitamin K. If you eat these foods, make sure you eat a consistent amount of them from day to day.  Avoid major changes in your diet, or tell your health care provider before changing your diet.  If you take a multivitamin that contains vitamin K, be sure to take it every day.  If you drink green tea, drink the same amount each day. WHAT FOODS ARE VERY HIGH IN VITAMIN K?   Greens, such as Swiss chard and beet, collard, mustard, or turnip greens (fresh or frozen, cooked).  Kale (fresh or frozen, cooked).   Parsley (raw).  Spinach (cooked).  WHAT FOODS ARE HIGH IN VITAMIN K?  Asparagus (frozen, cooked).  Beans, green (frozen, cooked).  Broccoli.   Bok choy (cooked).   Brussels sprouts (fresh or frozen, cooked).  Cabbage (cooked).  Coleslaw. WHAT FOODS ARE MODERATELY HIGH IN VITAMIN K?  Blueberries.  Black-eyed peas.  Endive (raw).   Green leaf lettuce (raw).   Green scallions  (raw).  Kale (raw).  Okra (frozen, cooked).  Plantains (fried).  Romaine lettuce (raw).   Sauerkraut (canned).   Spinach (raw). Document Released: 04/05/2009 Document Revised: 06/13/2013 Document Reviewed: 04/12/2013 [facility] Patient Information 2015 [place], [place]. This information is not intended to replace advice given to you by your health care provider. Make sure you discuss any questions you have with your health care provider.

## 2014-12-19 NOTE — ED Provider Notes (Signed)
CSN: [id number]     Arrival date & time 12/19/14  1602 History   First MD Initiated Contact with Patient 12/19/14 1610     Chief Complaint  Patient presents with  . Abnormal Lab     (Consider location/radiation/quality/duration/timing/severity/associated sxs/prior Treatment) HPI Comments: Pt comes in with c/o inr at 7.3. He states that he was sent here by his pcp after having his labs drawn this morning. He has not had any bleeding from stool, gums or urine. He is not having a headache. He states that he has changed anything recently. He takes 5 mg 3 days a week and 2.5mg  4 days a week.  The history is provided by the patient. No language interpreter was used.    Past Medical History  Diagnosis Date  . Hyperlipidemia   . TIA (transient ischemic attack)   . Atypical moles     melanomna  . Dizziness   . Cluster headaches   . Joint pain   . Gout   . Melanoma     hx of melanoma and multiple basal cell carcinomas Dr Tonia Brooms  . GERD (gastroesophageal reflux disease)   . Diverticulosis   . Fatty liver     hx of elevated hepatic transaminases-negative workup in 2009 except for U/S suggesting fatty liver  . Episodic recurrent vertigo 2002 & 2005    carotid dopplers w no clinically significant stenosis  . Positive ANA (antinuclear antibody)   . Basal cell carcinoma   . LBBB (left bundle branch block)   . S/P AVR (aortic valve replacement)     mechanical  . Chronic anticoagulation     for mechanical AVR  . CVA (cerebral vascular accident) 07/24/2014     MRI indicating 2 punctate foci of acute infarction in the l  . Bilateral carotid artery stenosis 08/03/2014    1-39% right and 40-59% left carotid artery stenosis  . Arthritis    Past Surgical History  Procedure Laterality Date  . Cardiac valve replacement    . Cholecystectomy    . Appendectomy    . Melanoma excision      x3  . Tee without cardioversion N/A 07/31/2014    Procedure: TRANSESOPHAGEAL ECHOCARDIOGRAM (TEE);  Surgeon:  Sueanne Margarita, MD;  Location[facility] ENDOSCOPY;  Service: Cardiovascular;  Laterality: N/A;  . Tee without cardioversion N/A 10/01/2014    Procedure: TRANSESOPHAGEAL ECHOCARDIOGRAM (TEE);  Surgeon: Lelon Perla, MD;  Location: [facility] ENDOSCOPY;  Service: Cardiovascular;  Laterality: N/A;  . Dental surgery Left bone graft and extraction   Family History  Problem Relation Age of Onset  . Melanoma    . Psoriasis    . Cancer Mother     melanoma, ovarian  . Cancer Father     prostate  . Valvular heart disease Brother   . Scoliosis Sister    History  Substance Use Topics  . Smoking status: Never Smoker   . Smokeless tobacco: Never Used  . Alcohol Use: 0.0 oz/week    0 Standard drinks or equivalent per week     Comment: moderate - 2-3 drinks about 3 times per week of wine, liquor, beer    Review of Systems  All other systems reviewed and are negative.     Allergies  Review of patient's allergies indicates no known allergies.  Home Medications   Prior to Admission medications   Medication Sig Start Date End Date Taking? Authorizing Provider  aspirin 81 MG tablet Take 81 mg by mouth daily.  Historical Provider, MD  colchicine 0.6 MG tablet Take 0.6 mg by mouth as needed.     Historical Provider, MD  cyclobenzaprine (FLEXERIL) 10 MG tablet Take 10 mg by mouth 3 (three) times daily as needed for muscle spasms.    Historical Provider, MD  enoxaparin (LOVENOX) 100 MG/ML injection Inject 100 mg into the skin. FOR BRIDGING WARFARIN FOR PROCEDURES    Historical Provider, MD  Multiple Vitamin (MULTIVITAMIN) capsule Take 1 capsule by mouth daily.      Historical Provider, MD  pantoprazole (PROTONIX) 40 MG tablet daily. 01/26/11   Historical Provider, MD  pravastatin (PRAVACHOL) 20 MG tablet Take 1 tablet (20 mg total) by mouth daily. Patient taking differently: Take 40 mg by mouth daily.  11/14/14   Donika Keith Rake, DO  ranitidine (ZANTAC) 75 MG tablet Take 75 mg by mouth 2 (two) times daily.     Historical Provider, MD  warfarin (COUMADIN) 5 MG tablet daily. 3 DAYS 5MG  4 DAYS 2.5 MG 01/26/11   Historical Provider, MD   BP 123/84 mmHg  Pulse 68  Temp(Src) 99 F (37.2 C) (Oral)  Resp 18  Ht 5\' 10"  (1.778 m)  Wt 185 lb (83.915 kg)  BMI 26.54 kg/m2  SpO2 98% Physical Exam  Constitutional: He is oriented to person, place, and time. He appears well-developed and well-nourished.  Cardiovascular: Normal rate and regular rhythm.   Pulmonary/Chest: Effort normal and breath sounds normal.  Neurological: He is alert and oriented to person, place, and time.  Skin: Skin is warm and dry.  Psychiatric: He has a normal mood and affect.  Nursing note and vitals reviewed.   ED Course  Procedures (including critical care time) Labs Review Labs Reviewed  BASIC METABOLIC PANEL - Abnormal; Notable for the following:    Glucose, Bld 115 (*)    All other components within normal limits  CBC WITH DIFFERENTIAL/PLATELET - Abnormal; Notable for the following:    Hemoglobin 11.9 (*)    HCT 35.8 (*)    All other components within normal limits  PROTIME-INR - Abnormal; Notable for the following:    Prothrombin Time 41.4 (*)    INR 4.48 (*)    All other components within normal limits    Imaging Review No results found.   EKG Interpretation None      MDM   Final diagnoses:  Elevated INR    inr 4.4 and pt to hold a dose tonight. Pt is not bleeding. Pt is okay to follow up with pcp    Glendell Docker, NP 12/19/14 1733  Serita Grit, MD 12/19/14 8850

## 2014-12-30 ENCOUNTER — Telehealth: Payer: Self-pay | Admitting: Cardiology

## 2014-12-30 NOTE — Telephone Encounter (Signed)
Heart monitor with no PAF.  Sinus bradycardia as low as 40 bpm.  Normal sinus rhythm.  Sinus tachycardia as high as 120 bpm.  Frequent PAC's and PVC's, ventricular couplets, atrial couplets Nonspecific interventricular conduction delay

## 2014-12-31 ENCOUNTER — Ambulatory Visit: Payer: 59 | Admitting: Neurology

## 2015-01-02 NOTE — Telephone Encounter (Signed)
Informed patient of results and verbal understanding expressed.  

## 2015-01-21 ENCOUNTER — Ambulatory Visit (INDEPENDENT_AMBULATORY_CARE_PROVIDER_SITE_OTHER): Payer: 59 | Admitting: Neurology

## 2015-01-21 ENCOUNTER — Encounter: Payer: Self-pay | Admitting: Neurology

## 2015-01-21 VITALS — BP 110/78 | HR 77 | Ht 70.0 in | Wt 184.2 lb

## 2015-01-21 DIAGNOSIS — G3184 Mild cognitive impairment, so stated: Secondary | ICD-10-CM

## 2015-01-21 DIAGNOSIS — I639 Cerebral infarction, unspecified: Secondary | ICD-10-CM

## 2015-01-21 DIAGNOSIS — G609 Hereditary and idiopathic neuropathy, unspecified: Secondary | ICD-10-CM

## 2015-01-21 DIAGNOSIS — I6340 Cerebral infarction due to embolism of unspecified cerebral artery: Secondary | ICD-10-CM

## 2015-01-21 MED ORDER — PRAVASTATIN SODIUM 40 MG PO TABS: 40.0000 mg | ORAL_TABLET | Freq: Every day | ORAL | Status: DC

## 2015-01-21 NOTE — Patient Instructions (Signed)
Continue your medications as you are taking them You cognitive testing shows difficulty with short-term memory.  Start doing mentally stimulating activities such as puzzles, stay active, and eat healthy.  When you are ready to have more formal testing, please contact my office so we can schedule it. Return to clinic as needed

## 2015-01-21 NOTE — Progress Notes (Signed)
Follow-up Visit   Date: 01/21/2015    Tracy Marshall. MRN: [id number] DOB: 06/28/1950   Interim History: Tracy Marshall. is a 64 y.o. 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  The patient was accompanied to the clinic by wife who also provides collateral information.    History of present illness: In 2001, he developed an episode of slurred speech lasting a few minutes. This occurred several weeks after his valve replacement and was told he had a TIA. He had no residual deficits.  In December 2015, he was visiting his brother and a family member had noticed that he developed left facial droop. During the same time, he recalls being very ill and having fevers and was taking a lot of over-the-counter therapeutic medication. He underwent MRI brain in January which showed subacute right insula infarct with 2 foci of punctate left frontal cortex infarcts. There is evidence of old infarcts involving the right temporoparietal region also. These findings are most concerning for central embolic source. However, his TEE did not show any thrombus or unstable plaque of the aortic arch. His left facial weakness gradually improved over the next few months. Currently, he denies any neurological deficits.   His wife reports that she has noticed some cognitive slowing and mood changes, described as excitable and grumpy. Denies excessive yawning, or inappropriate crying/laughter.   In the past, he reports having spells of blurry vision, lasting about 10-min which is followed by a severe headache. Duration is about 1-2 days, occuring about once per month. He has associated phonophobia, no photophobia, nausea/vomiting.  He had a tooth extracted in May and  was bridged to Lovenox and developed a blood clot in his urinary tract.  UPDATE 01/21/2015:  He reports having new midback pain and is scheduled for MRI of the thoracic spine tomorrow by Dr. Maxie Better. Denies radicular pain. No numbness/tingling or shooting pain.   He has been doing well from a stroke stand-point.  No new issues.  He no longer has any headaches.  He is still having problems with short-term memory.  Denies any problems managing finances, driving, or other IADLs.  He is depressed and exhausted from the number of doctors visits lately.    Medications:  Current Outpatient Prescriptions on File Prior to Visit  Medication Sig Dispense Refill  . aspirin 81 MG tablet Take 81 mg by mouth daily.    . colchicine 0.6 MG tablet Take 0.6 mg by mouth as needed.     . enoxaparin (LOVENOX) 100 MG/ML injection Inject 100 mg into the skin. FOR BRIDGING WARFARIN FOR PROCEDURES    . Multiple Vitamin (MULTIVITAMIN) capsule Take 1 capsule by mouth daily.      . pantoprazole (PROTONIX) 40 MG tablet daily.    . pravastatin (PRAVACHOL) 20 MG tablet Take 1 tablet (20 mg total) by mouth daily. (Patient taking differently: Take 40 mg by mouth daily. ) 90 tablet 3  . ranitidine (ZANTAC) 75 MG tablet Take 75 mg by mouth 2 (two) times daily.    [name] warfarin (COUMADIN) 5 MG tablet daily. 3 DAYS 5MG  4 DAYS 2.5 MG     No current facility-administered medications on file prior to visit.    Allergies: No Known Allergies  Review of Systems:  CONSTITUTIONAL: No fevers, chills, night sweats, or weight loss.  EYES: No visual changes or eye pain ENT: No hearing changes.  No history of nose bleeds.   RESPIRATORY: No cough, wheezing and shortness of breath.   CARDIOVASCULAR: Negative for chest pain, and palpitations.   GI: Negative for abdominal discomfort, blood in stools or black stools.  No recent change in bowel habits.   GU:  No history of incontinence.   MUSCLOSKELETAL: +history of joint pain or swelling.  No  myalgias.   SKIN: Negative for lesions, rash, and itching.   ENDOCRINE: Negative for cold or heat intolerance, polydipsia or goiter.   PSYCH:  + depression or anxiety symptoms.   NEURO: As Above.   Vital Signs:  BP 110/78 mmHg  Pulse 77  Ht 5\' 10"  (1.778 m)  Wt 184 lb 4 oz (83.575 kg)  BMI 26.44 kg/m2  SpO2 97%  Neurological Exam: MENTAL STATUS including orientation to time, place, person, recent and remote memory, attention span and concentration, language, and fund of knowledge is moderately intact.  Speech is not dysarthric.  Montreal Cognitive Assessment  01/21/2015  Visuospatial/ Executive (0/5) 3  Naming (0/3) 3  Attention: Read list of digits (0/2) 2  Attention: Read list of letters (0/1) 1  Attention: Serial 7 subtraction starting at 100 (0/3) 3  Language: Repeat phrase (0/2) 2  Language : Fluency (0/1) 0  Abstraction (0/2) 2  Delayed Recall (0/5) 0  Orientation (0/6) 6  Total 22   CRANIAL NERVES: No visual field defects. Pupils equal round and reactive to light.  Normal conjugate, extra-ocular eye movements in all directions of gaze.  No ptosis. Normal facial sensation.  Face is symmetric. Palate elevates symmetrically.  Tongue is midline.  MOTOR:  Motor strength is 5/5 in all extremities. No atrophy, fasciculations or abnormal movements.  No pronator drift.  Tone is normal.    MSRs:  Reflexes are 2+/4 throughout.  SENSORY:  Vibration and temperature reduced at the ankles bilaterally  COORDINATION/GAIT:  Normal finger-to- nose-finger.  Intact rapid alternating movements bilaterally.  Gait narrow based and stable.   Data: MRI brain wo contrast 07/21/2014: 2 punctate foci of acute infarction in the left posterior frontal cortex without hemorrhage or mass effect. Late subacute infarction in the right insula and frontal opercular region. Old right temporoparietal cortical and subcortical infarction. Infarctions of different age in both hemispheres suggest embolic  infarctions, possibly from the heart or ascending aorta or both carotid bifurcation regions.   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  Carotid Dopplers 08/03/2014: 1-39% right and 40-59% left carotid artery stenosis - repeat dopplers in 1 year  Cardiac monitoring 12/2014:  Heart monitor with no PAF.  Sinus bradycardia as low as 40 bpm.  Normal sinus rhythm.  Sinus tachycardia as high as 120 bpm.  Frequent PAC's and PVC's, ventricular couplets, atrial couplets  Labs 11/14/2014:  Vitamin B12 822, HbA1c 6.0, LDL 119, Chol 210, HDL 43  IMPRESSION/PLAN: 1.  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  His work-up to-date has been extensive and included MRI brain, surface echo, TEE, US carotids, and cardiac monitoring. TEE without thrombus, small PFO. CT chest/abdomen/pelvis does not show any worrisome malignant lesions, but there is evidence of an abdominal aortic aneurysm. Potential embolic sources include:  Mechanical aortic valve,  carotid stenosis (but would not explain right sided strokes with more diseased LICA), small PFO, AAA, possible that he may have also been subtherapeutic in the setting of an acute systemic illness  Continue secondary stroke risk factor prevention with anticoagulation and aspirin therapy, pravastatin 40mg  daily CTA if new symptoms develop  2. Moderate cognitive impairment (MOCA 22/30) Recommend neuropsychological testing to better characterize the nature of his memory problems, but because he has a lot of other medical issues, he would like to hold on this for now.  He feels some of it may be due to depressed mood. Encouraged him to stay active and engage in mentally stimulating activities  3.  Midback pain, followed by Dr. Maxie Better  Return to clinic  as needed   The duration of this appointment visit was 30 minutes of face-to-face time with the patient.  Greater than 50% of this time was spent in counseling, explanation of diagnosis, planning of further management, and coordination of care.   Thank you for allowing me to participate in patient's care.  If I can answer any additional questions, I would be pleased to do so.    Sincerely,    Nekoda Chock K. Posey Pronto, DO

## 2015-03-06 ENCOUNTER — Encounter: Payer: Self-pay | Admitting: Cardiology

## 2015-03-20 ENCOUNTER — Telehealth: Payer: Self-pay | Admitting: Cardiology

## 2015-03-20 NOTE — Telephone Encounter (Signed)
Follow up    Surgery  Date is  10.6.2016 @ 9:30 am

## 2015-03-20 NOTE — Telephone Encounter (Addendum)
Spoke with Estill Bamberg at Dr. Paul Dykes office to clarify procedure. He will be having 1 tooth extraction, 2 surgical implants that involve removing bone, and will be under IV sedation. Dr. Lavonne Chick would like patient to be off of Coumadin. He will need to be off of Coumadin for 5 days and be bridged with Lovenox. He does not see our CVRR/Coumadin Clinic - his Coumadin is managed by his PCP Dr. Orpah Melter at [facility]. Notified his office that they will need to coordinate Lovenox bridge for patient as they have in the past for previous dental work. They will schedule patient in clinic to coordinate Lovenox bridging. Patient will also need SBE prophylaxis before his procedure - Estill Bamberg is aware and the dental office will order this for the patient.

## 2015-03-20 NOTE — Telephone Encounter (Signed)
New message    1. What dental office are you calling from? Dr. Morene Crocker  2. What is your office phone and fax number? [phone] / fax # [phone]   3. What type of procedure is the patient having performed? Surgery impacted and extraction  4. What date is procedure scheduled? Pending    5. What is your question (ex. Antibiotics prior to procedure, holding medication-we need to know how long dentist wants pt to hold med)? Warfarin , was told that Dr. Radford Pax will be taken him off this medication

## 2015-04-24 ENCOUNTER — Encounter: Payer: Self-pay | Admitting: Cardiology

## 2015-05-23 ENCOUNTER — Other Ambulatory Visit: Payer: Self-pay | Admitting: Thoracic Surgery (Cardiothoracic Vascular Surgery)

## 2015-05-23 DIAGNOSIS — I712 Thoracic aortic aneurysm, without rupture, unspecified: Secondary | ICD-10-CM

## 2015-05-30 ENCOUNTER — Other Ambulatory Visit: Payer: Self-pay | Admitting: Thoracic Surgery (Cardiothoracic Vascular Surgery)

## 2015-05-30 DIAGNOSIS — I712 Thoracic aortic aneurysm, without rupture, unspecified: Secondary | ICD-10-CM

## 2015-06-04 ENCOUNTER — Ambulatory Visit
Admission: RE | Admit: 2015-06-04 | Discharge: 2015-06-04 | Disposition: A | Discharge status: Home / Self Care | Payer: 59 | Source: Ambulatory Visit | Attending: Thoracic Surgery (Cardiothoracic Vascular Surgery) | Admitting: Thoracic Surgery (Cardiothoracic Vascular Surgery)

## 2015-06-04 ENCOUNTER — Ambulatory Visit (INDEPENDENT_AMBULATORY_CARE_PROVIDER_SITE_OTHER): Payer: 59 | Admitting: Thoracic Surgery (Cardiothoracic Vascular Surgery)

## 2015-06-04 ENCOUNTER — Ambulatory Visit: Payer: 59 | Admitting: Cardiology

## 2015-06-04 ENCOUNTER — Encounter: Payer: Self-pay | Admitting: Thoracic Surgery (Cardiothoracic Vascular Surgery)

## 2015-06-04 VITALS — BP 137/75 | HR 61 | Resp 16 | Ht 70.0 in | Wt 175.0 lb

## 2015-06-04 DIAGNOSIS — I712 Thoracic aortic aneurysm, without rupture, unspecified: Secondary | ICD-10-CM

## 2015-06-04 DIAGNOSIS — Z952 Presence of prosthetic heart valve: Secondary | ICD-10-CM

## 2015-06-04 DIAGNOSIS — Z954 Presence of other heart-valve replacement: Secondary | ICD-10-CM

## 2015-06-04 MED ORDER — IOPAMIDOL (ISOVUE-370) INJECTION 76%
100.0000 mL | Freq: Once | INTRAVENOUS | Status: AC | PRN
  Administered 2015-06-04: 100 mL via INTRAVENOUS

## 2015-06-04 NOTE — Progress Notes (Signed)
Tracy Marshall       Tracy Marshall,Tracy Marshall             [phone]      HPI: Tracy Marshall returns for a scheduled 6 month follow-up visit regarding his ascending aneurysm.  He is a 64 year old gentleman with a history of mechanical aortic valve replacement in 2001 in [place]. [facility]).  He moved to [place] a few years ago for work but is now retired.  He was in his usual state of health until last winter. He was visiting with family and they noticed a left facial droop. An MR showed bilateral small strokes. He has had an extensive workup for that including carotid duplex, transesophageal echocardiography, and CT angiography. Carotid duplex showed a 1-39% stenosis on the right and a 40-59% stenosis on the left side. TEE showed the mechanical aortic valve prosthesis was working normally with mild aortic insufficiency. There was a small PFO. There was no intracardiac source identified. No plaque was noted in the arch or ascending aorta. His CT angiogram showed an ascending aortic aneurysm measured 4.5 cm at the level of the right pulmonary artery.  I saw him in June. He now returns for 6 month follow-up.  In the interim since his last visit he has not had any new issues arise. His wife feels that he is a little slower since he had his strokes. He does not had any residual physical deficits. He denies chest pain, pressure, or tightness. He denies shortness of breath with exertion.  Past Medical History  Diagnosis Date  . Hyperlipidemia   . TIA (transient ischemic attack)   . Atypical moles     melanomna  . Dizziness   . Cluster headaches   . Joint pain   . Gout   . Melanoma ([place])     hx of melanoma and multiple basal cell carcinomas Dr Tonia Brooms  . GERD (gastroesophageal reflux disease)   . Diverticulosis   . Fatty liver     hx of elevated hepatic transaminases-negative workup in 2009 except for U/S suggesting fatty liver  . Episodic recurrent  vertigo 2002 & 2005    carotid dopplers w no clinically significant stenosis  . Positive ANA (antinuclear antibody)   . Basal cell carcinoma   . LBBB (left bundle branch block)   . S/P AVR (aortic valve replacement)     mechanical  . Chronic anticoagulation     for mechanical AVR  . CVA (cerebral vascular accident) ([place]) 07/24/2014     MRI indicating 2 punctate foci of acute infarction in the l  . Bilateral carotid artery stenosis 08/03/2014    1-39% right and 40-59% left carotid artery stenosis  . Arthritis       Current Outpatient Prescriptions  Medication Sig Dispense Refill  . aspirin 81 MG tablet Take 81 mg by mouth daily.    . colchicine 0.6 MG tablet Take 0.6 mg by mouth as needed.     . Multiple Vitamin (MULTIVITAMIN) capsule Take 1 capsule by mouth daily.      . pantoprazole (PROTONIX) 40 MG tablet daily.    . pravastatin (PRAVACHOL) 40 MG tablet Take 1 tablet (40 mg total) by mouth daily. 90 tablet 3  . ranitidine (ZANTAC) 75 MG tablet Take 75 mg by mouth 2 (two) times daily.    [name] warfarin (COUMADIN) 5 MG tablet daily. 3 DAYS 5MG  4 DAYS 2.5 MG    . enoxaparin (LOVENOX)  100 MG/ML injection Inject 100 mg into the skin. FOR BRIDGING WARFARIN FOR PROCEDURES     No current facility-administered medications for this visit.    Physical Exam BP 137/75 mmHg  Pulse 61  Resp 16  Ht 5\' 10"  (1.778 m)  Wt 175 lb (79.379 kg)  BMI 25.11 kg/m2  SpO2 99% Well-appearing 64 year old man in no acute distress Alert and oriented 3 with no focal motor deficit No carotid bruits Cardiac regular rate and rhythm, good valve click, 2/6 systolic murmur Pulses 2+ throughout Lungs clear breath sounds bilaterally  Diagnostic Tests: CT ANGIOGRAPHY CHEST WITH CONTRAST  TECHNIQUE: Multidetector CT imaging of the chest was performed using the standard protocol during bolus administration of intravenous contrast. Multiplanar CT image reconstructions and MIPs were obtained to evaluate the  vascular anatomy.  CONTRAST: 100 cc Isovue  COMPARISON: 11/16/2014  FINDINGS: Images of the thoracic inlet are unremarkable. Again noted status post median sternotomy and aortic valve replacement.  There is no mediastinal hematoma or adenopathy. Central pulmonary artery is unremarkable. No hilar adenopathy. Small hiatal hernia again noted.  The visualized upper abdomen is stable. Stable scarring in upper pole of the left kidney.  Again noted aneurysm of ascending aorta measures 4.4 x 4.5 cm. This is stable in size in appearance from prior exam. There is no evidence of periaortic leak or aortic dissection. No destructive bony lesions are noted.  Sagittal images of the spine shows mild degenerative changes thoracic spine.  Images of the lung parenchyma shows no acute infiltrate or pulmonary edema. There is no focal consolidation. No pneumothorax.  Review of the MIP images confirms the above findings.  IMPRESSION: 1. There is stable aneurysm of ascending aorta measures 4.5 by 4.4 cm. No evidence of periaortic leak or dissection. Recommend semi-annual imaging followup by CTA or MRA and referral to cardiothoracic surgery if not already obtained. This recommendation follows 2010 ACCF/AHA/AATS/ACR/ASA/SCA/SCAI/SIR/STS/SVM Guidelines for the Diagnosis and Management of Patients With Thoracic Aortic Disease. Circulation. 2010; 121: e266-e36 2. Again noted status post median sternotomy and aortic valve replacement. 3. Mild degenerative changes thoracic spine. 4. No acute infiltrate or pulmonary edema. No adenopathy. 5. Small hiatal hernia.   Electronically Signed  By: Lahoma Crocker M.D.  On: 06/04/2015 10:33  I personally reviewed the CT angiogram and concur with findings as noted above, with the same caveats mentioned during his last visit.  Impression: 64 year old man with a history of aortic valve replacement in 2001 who has a 4.5 cm ascending aortic  aneurysm. As noted in my original consult that may be a bit of an overestimate with a slightly tangential measurement, but is in the ball park. It certainly is no larger than that.  There is no indication for surgery at this time. His wife was with him today and had multiple questions. I once again reviewed the indications for surgery which would be interval growth of 5 mm in 6 months or size of 5.5-6 cm.  He had questions about symptoms. I once again informed him that the symptom is pain typically in the chest but sometimes the back or both. If he experiences severe chest or back pain he should call for medical assistance immediately.  His INR is being monitored elsewhere.  He had questions about exercise. I advised him to get as much aerobic exercise as possible. He should avoid lifting extreme weights, but otherwise there are no restrictions on his activities.  His blood pressure is within normal limits but towards the high side systolic.  Plan: Return in 6 months CT angiogram.  I spent 15 minutes with Tracy Marshall during this visit, greater than 50% spent in counseling.  Melrose Nakayama, MD [facility] [phone]

## 2015-06-06 ENCOUNTER — Encounter: Payer: Self-pay | Admitting: Cardiology

## 2015-06-06 ENCOUNTER — Ambulatory Visit (INDEPENDENT_AMBULATORY_CARE_PROVIDER_SITE_OTHER): Payer: 59 | Admitting: Cardiology

## 2015-06-06 VITALS — BP 110/72 | HR 62 | Ht 70.0 in | Wt 183.4 lb

## 2015-06-06 DIAGNOSIS — I6523 Occlusion and stenosis of bilateral carotid arteries: Secondary | ICD-10-CM

## 2015-06-06 DIAGNOSIS — I359 Nonrheumatic aortic valve disorder, unspecified: Secondary | ICD-10-CM

## 2015-06-06 DIAGNOSIS — Z952 Presence of prosthetic heart valve: Secondary | ICD-10-CM

## 2015-06-06 DIAGNOSIS — R0789 Other chest pain: Secondary | ICD-10-CM | POA: Diagnosis not present

## 2015-06-06 DIAGNOSIS — Z954 Presence of other heart-valve replacement: Secondary | ICD-10-CM

## 2015-06-06 DIAGNOSIS — I447 Left bundle-branch block, unspecified: Secondary | ICD-10-CM | POA: Diagnosis not present

## 2015-06-06 NOTE — Patient Instructions (Addendum)
Medication Instructions:  Your physician recommends that you continue on your current medications as directed. Please refer to the Current Medication list given to you today.   Labwork: None ordered  Testing/Procedures: None ordered  Follow-Up: Your physician wants you to follow-up in: 6 MONTHS WITH DR. TURNER.  You will receive a reminder letter in the mail two months in advance. If you don't receive a letter, please call our office to schedule the follow-up appointment.    Any Other Special Instructions Will Be Listed Below (If Applicable).    If you need a refill on your cardiac medications before your next appointment, please call your pharmacy.   

## 2015-06-06 NOTE — Progress Notes (Signed)
06/06/2015 [name]   October 12, 1950  [id number]  Primary Physician Orpah Melter, MD Primary Cardiologist: Dr. Radford Pax   Reason for Visit/CC: F/u for AV disease.   HPI:  64 y.o. Male, followed by Dr. Radford Pax, who presents to clinic for routine 6 month f/u.  He has a history of mechanical AVR remotely in 2001. He first saw Dr. Radford Pax in April 2014 with complaints of sharp CP. He was under a lot of stress with his wife having breast CA. A nuclear stress test was done which was low risk and showed a small apical defect. He had an echo done in 05/2014 for followup of his AVF and showed mild LV dysfunction EF 45-50% and a MUGA was done to verify EF which was 45% and echo from 2012 was 52%. He was continued on medical therapy. He was also recently discovered by MRI have small bilateral CVAs despite being therapeutic on his coumadin.This was done after noticing unilateral facial droop.  Carotid dopplers showed 1-39% right and 40-59% left carotid artery stenosis. TEE negative. Since that time, he denies any new neurological deficits.   He was also recently discovered to have a ascending aortic aneurysm, which is now followed by Dr. Blase Mess.  Recent CT measured aneurysm  to be 4.5 by 4.4 cm. CT is scheduled for every 6 months.   Today in clinic, he reports that he has done well since his last OV. He denies any CP, dyspnea, orthopnea, PND, LEE, palpations, dizziness, syncope/ near syncope. He reports full medication compliance. His PCP follows his INR which has been well controlled. He denies any falls or significant abnormal bleeding.   Current Outpatient Prescriptions  Medication Sig Dispense Refill  . aspirin 81 MG tablet Take 81 mg by mouth daily.    . colchicine 0.6 MG tablet Take 0.6 mg by mouth as needed (GOUT).     [name] enoxaparin (LOVENOX) 100 MG/ML injection Inject 100 mg into the skin. FOR BRIDGING WARFARIN FOR PROCEDURES    . Multiple Vitamin (MULTIVITAMIN) capsule Take 1  capsule by mouth daily.      . pantoprazole (PROTONIX) 40 MG tablet daily.    . pravastatin (PRAVACHOL) 40 MG tablet Take 1 tablet (40 mg total) by mouth daily. 90 tablet 3  . ranitidine (ZANTAC) 75 MG tablet Take 75 mg by mouth 2 (two) times daily.    [name] warfarin (COUMADIN) 5 MG tablet daily. 3 DAYS 5MG  4 DAYS 2.5 MG     No current facility-administered medications for this visit.    No Known Allergies  Social History   Social History  . Marital Status: Married    Spouse Name: N/A  . Number of Children: N/A  . Years of Education: N/A   Occupational History  . Not on file.   Social History Main Topics  . Smoking status: Never Smoker   . Smokeless tobacco: Never Used  . Alcohol Use: 0.0 oz/week    0 Standard drinks or equivalent per week     Comment: moderate - 2-3 drinks about 3 times per week of wine, liquor, beer  . Drug Use: No  . Sexual Activity: Not on file   Other Topics Concern  . Not on file   Social History Narrative   Lives with wife in a one story home.  No children.     Retired from [employer].     Review of Systems: General: negative for chills, fever, night sweats or weight changes.  Cardiovascular: negative  for chest pain, dyspnea on exertion, edema, orthopnea, palpitations, paroxysmal nocturnal dyspnea or shortness of breath Dermatological: negative for rash Respiratory: negative for cough or wheezing Urologic: negative for hematuria Abdominal: negative for nausea, vomiting, diarrhea, bright red blood per rectum, melena, or hematemesis Neurologic: negative for visual changes, syncope, or dizziness All other systems reviewed and are otherwise negative except as noted above.    Blood pressure 110/72, pulse 62, height 5\' 10"  (1.778 m), weight 183 lb 6.4 oz (83.19 kg).  General appearance: alert, cooperative and no distress Neck: no carotid bruit and no JVD Lungs: clear to auscultation bilaterally Heart: regular rate and rhythm, S1, S2 normal,  crisp mechanical valve sounds noted Extremities: no LEE Pulses: 2+ and symmetric Skin: warm and dry Neurologic: Grossly normal  EKG Chronic LBBB. HR 62 bpm.   ASSESSMENT AND PLAN:    1. Mechanical AVR with mild systolic murmur - 2D echo showed normally functioning AVR - on coumadin and aspirin - no evidence of vegetation  2. Chronic systemic anticoagulation - followed by his PCP  3. Abnormal MRI with prior strokes noted -  Has a PFO not really described in the TEE report - does not sound like any atrial fib has been noted. Previously reviewed by Dr. Angelena Form- he does not feel this is significant. Already on anticoagulation with warfarin. Carotid dopplers with mild bilateral stenosis. Yearly f/u recommended.   4. Ascending Aortic aneurysm: Stable 4.5 x 4.4 cm. Followed by Dr. Roxan Hockey.   PLAN  Stable from a cardiac standpoint. F/u with Dr. Radford Pax in 6 months.   Lyda Jester PA-C 06/06/2015 11:46 AM

## 2015-07-16 ENCOUNTER — Other Ambulatory Visit: Payer: Self-pay | Admitting: Cardiology

## 2015-07-16 DIAGNOSIS — I6523 Occlusion and stenosis of bilateral carotid arteries: Secondary | ICD-10-CM

## 2015-08-05 ENCOUNTER — Ambulatory Visit (INDEPENDENT_AMBULATORY_CARE_PROVIDER_SITE_OTHER): Payer: 59 | Admitting: Pharmacist Clinician (PhC)/ Clinical Pharmacy Specialist

## 2015-08-05 ENCOUNTER — Ambulatory Visit (HOSPITAL_COMMUNITY)
Admission: RE | Admit: 2015-08-05 | Discharge: 2015-08-05 | Disposition: A | Discharge status: Home / Self Care | Payer: 59 | Source: Ambulatory Visit | Attending: Cardiology | Admitting: Cardiology

## 2015-08-05 DIAGNOSIS — Z954 Presence of other heart-valve replacement: Secondary | ICD-10-CM | POA: Diagnosis not present

## 2015-08-05 DIAGNOSIS — Z7901 Long term (current) use of anticoagulants: Secondary | ICD-10-CM

## 2015-08-05 DIAGNOSIS — I6523 Occlusion and stenosis of bilateral carotid arteries: Secondary | ICD-10-CM | POA: Diagnosis present

## 2015-08-05 DIAGNOSIS — E785 Hyperlipidemia, unspecified: Secondary | ICD-10-CM | POA: Insufficient documentation

## 2015-08-05 DIAGNOSIS — Z952 Presence of prosthetic heart valve: Secondary | ICD-10-CM

## 2015-08-05 LAB — POCT INR: INR: 2.6

## 2015-08-06 ENCOUNTER — Telehealth: Payer: Self-pay

## 2015-08-06 DIAGNOSIS — I6523 Occlusion and stenosis of bilateral carotid arteries: Secondary | ICD-10-CM

## 2015-08-06 NOTE — Telephone Encounter (Signed)
Informed patient of results and verbal understanding expressed.  Repeat study ordered to be scheduled in 2 years. Patient agrees with treatment plan. 

## 2015-08-06 NOTE — Telephone Encounter (Signed)
-----   Message from Sueanne Margarita, MD sent at 08/06/2015 10:52 AM EST ----- 1-39% bilateral carotid artery stenosis - repeat study in 2 years

## 2015-11-21 ENCOUNTER — Other Ambulatory Visit: Payer: Self-pay | Admitting: Thoracic Surgery (Cardiothoracic Vascular Surgery)

## 2015-11-21 DIAGNOSIS — I712 Thoracic aortic aneurysm, without rupture: Secondary | ICD-10-CM

## 2015-11-21 DIAGNOSIS — I7121 Aneurysm of the ascending aorta, without rupture: Secondary | ICD-10-CM

## 2015-12-05 ENCOUNTER — Ambulatory Visit (INDEPENDENT_AMBULATORY_CARE_PROVIDER_SITE_OTHER): Payer: 59 | Admitting: Cardiology

## 2015-12-05 ENCOUNTER — Encounter: Payer: Self-pay | Admitting: Cardiology

## 2015-12-05 VITALS — BP 140/96 | HR 68 | Ht 70.0 in | Wt 182.0 lb

## 2015-12-05 DIAGNOSIS — Q211 Atrial septal defect: Secondary | ICD-10-CM | POA: Diagnosis not present

## 2015-12-05 DIAGNOSIS — I359 Nonrheumatic aortic valve disorder, unspecified: Secondary | ICD-10-CM

## 2015-12-05 DIAGNOSIS — I712 Thoracic aortic aneurysm, without rupture, unspecified: Secondary | ICD-10-CM

## 2015-12-05 DIAGNOSIS — I6523 Occlusion and stenosis of bilateral carotid arteries: Secondary | ICD-10-CM | POA: Diagnosis not present

## 2015-12-05 DIAGNOSIS — Q2112 Patent foramen ovale: Secondary | ICD-10-CM

## 2015-12-05 DIAGNOSIS — E785 Hyperlipidemia, unspecified: Secondary | ICD-10-CM | POA: Insufficient documentation

## 2015-12-05 DIAGNOSIS — R030 Elevated blood-pressure reading, without diagnosis of hypertension: Secondary | ICD-10-CM

## 2015-12-05 DIAGNOSIS — IMO0001 Reserved for inherently not codable concepts without codable children: Secondary | ICD-10-CM | POA: Insufficient documentation

## 2015-12-05 HISTORY — DX: Patent foramen ovale: Q21.12

## 2015-12-05 HISTORY — DX: Atrial septal defect: Q21.1

## 2015-12-05 HISTORY — DX: Hyperlipidemia, unspecified: E78.5

## 2015-12-05 NOTE — Patient Instructions (Addendum)
Medication Instructions:  Your physician recommends that you continue on your current medications as directed. Please refer to the Current Medication list given to you today.   Labwork: Your physician recommends that you return for FASTING lab work.  Testing/Procedures: None  Follow-Up: Your physician wants you to follow-up in: 1 year with Dr. Turner. You will receive a reminder letter in the mail two months in advance. If you don't receive a letter, please call our office to schedule the follow-up appointment.   Any Other Special Instructions Will Be Listed Below (If Applicable). Please check your BLOOD PRESSURE daily for a week and call with results.    If you need a refill on your cardiac medications before your next appointment, please call your pharmacy.   

## 2015-12-05 NOTE — Progress Notes (Signed)
Cardiology Office Note    Date:  12/05/2015   ID:  [name], DOB Jun 30, 1950, MRN [id number]  PCP:  Orpah Melter, MD  Cardiologist:  Fransico Him, MD   Chief Complaint  Patient presents with  . Follow-up    Aortic valve disease    History of Present Illness:  Tracy Marshall. is a 65 y.o. male with a history of mechanical AVR remotely in 2001 (reason unknown to patient), mild LV dysfunction EF 45-50% and a MUGA was done to verify EF which was 45% and echo from 2012 was 52%.  He also has an ascending thoracic aneurysm that is being followed by Dr. Roxan Hockey. He is doing well today.  He denies any chest pain, SOB, DOE, LE edema, dizziness, palpitations or syncope. He has not had any problems with bleeding on warfarin.   Past Medical History  Diagnosis Date  . Hyperlipidemia   . TIA (transient ischemic attack)   . Atypical moles     melanomna  . Dizziness   . Cluster headaches   . Joint pain   . Gout   . Melanoma ([place])     hx of melanoma and multiple basal cell carcinomas Dr Tonia Brooms  . GERD (gastroesophageal reflux disease)   . Diverticulosis   . Fatty liver     hx of elevated hepatic transaminases-negative workup in 2009 except for U/S suggesting fatty liver  . Episodic recurrent vertigo 2002 & 2005    carotid dopplers w no clinically significant stenosis  . Positive ANA (antinuclear antibody)   . Basal cell carcinoma   . LBBB (left bundle branch block)   . S/P AVR (aortic valve replacement)     mechanical  . Chronic anticoagulation     for mechanical AVR  . CVA (cerebral vascular accident) ([place]) 07/24/2014     MRI indicating 2 punctate foci of acute infarction in the l  . Bilateral carotid artery stenosis 08/03/2014    1-39% right and 40-59% left carotid artery stenosis  . Arthritis   . PFO (patent foramen ovale) 12/05/2015  . Hyperlipidemia LDL goal <70 12/05/2015    Past Surgical History  Procedure Laterality Date  . Cardiac valve replacement      . Cholecystectomy    . Appendectomy    . Melanoma excision      x3  . Tee without cardioversion N/A 07/31/2014    Procedure: TRANSESOPHAGEAL ECHOCARDIOGRAM (TEE);  Surgeon: Sueanne Margarita, MD;  Location: [facility] ENDOSCOPY;  Service: Cardiovascular;  Laterality: N/A;  . Tee without cardioversion N/A 10/01/2014    Procedure: TRANSESOPHAGEAL ECHOCARDIOGRAM (TEE);  Surgeon: Lelon Perla, MD;  Location: [facility] ENDOSCOPY;  Service: Cardiovascular;  Laterality: N/A;  . Dental surgery Left bone graft and extraction    Current Medications: Outpatient Prescriptions Prior to Visit  Medication Sig Dispense Refill  . aspirin 81 MG tablet Take 81 mg by mouth daily.    . colchicine 0.6 MG tablet Take 0.6 mg by mouth as needed (GOUT).     [name] enoxaparin (LOVENOX) 100 MG/ML injection Inject 100 mg into the skin. FOR BRIDGING WARFARIN FOR PROCEDURES    . Multiple Vitamin (MULTIVITAMIN) capsule Take 1 capsule by mouth daily.      . pantoprazole (PROTONIX) 40 MG tablet daily.    . pravastatin (PRAVACHOL) 40 MG tablet Take 1 tablet (40 mg total) by mouth daily. 90 tablet 3  . ranitidine (ZANTAC) 75 MG tablet Take 75 mg by mouth 2 (two) times daily.    [name]  warfarin (COUMADIN) 5 MG tablet daily. 3 DAYS 5MG  4 DAYS 2.5 MG     No facility-administered medications prior to visit.     Allergies:   Review of patient's allergies indicates no known allergies.   Social History   Social History  . Marital Status: Married    Spouse Name: N/A  . Number of Children: N/A  . Years of Education: N/A   Social History Main Topics  . Smoking status: Never Smoker   . Smokeless tobacco: Never Used  . Alcohol Use: 0.0 oz/week    0 Standard drinks or equivalent per week     Comment: moderate - 2-3 drinks about 3 times per week of wine, liquor, beer  . Drug Use: No  . Sexual Activity: Not Asked   Other Topics Concern  . None   Social History Narrative   Lives with wife in a one story home.  No children.     Retired from  [employer].     Family History:  The patient's family history includes Cancer in his father and mother; Scoliosis in his sister; Valvular heart disease in his brother.   ROS:   Please see the history of present illness.    ROS All other systems reviewed and are negative.   PHYSICAL EXAM:   VS:  BP 140/96 mmHg  Pulse 68  Ht 5\' 10"  (1.778 m)  Wt 182 lb (82.555 kg)  BMI 26.11 kg/m2  SpO2 97%   GEN: Well nourished, well developed, in no acute distress HEENT: normal Neck: no JVD, carotid bruits, or masses Cardiac: RRR; no rubs, or gallops,no edema.  Intact distal pulses bilaterally. 2/6 SM at RUSB to [place]. Occasional ectopy  Respiratory:  clear to auscultation bilaterally, normal work of breathing GI: soft, nontender, nondistended, + BS MS: no deformity or atrophy Skin: warm and dry, no rash Neuro:  Alert and Oriented x 3, Strength and sensation are intact Psych: euthymic mood, full affect  Wt Readings from Last 3 Encounters:  12/05/15 182 lb (82.555 kg)  06/06/15 183 lb 6.4 oz (83.19 kg)  06/04/15 175 lb (79.379 kg)      Studies/Labs Reviewed:   EKG:  EKG is ordered today and showed NSR with sinus arrhythmia an LBBB  and  Recent Labs: 12/19/2014: BUN 19; Creatinine, Ser 0.98; Hemoglobin 11.9*; Platelets 277; Potassium 3.7; Sodium 140   Lipid Panel    Component Value Date/Time   CHOL 210* 11/14/2014 1244   TRIG 213* 11/14/2014 1244   HDL 48 11/14/2014 1244   CHOLHDL 4.4 11/14/2014 1244   VLDL 43* 11/14/2014 1244   LDLCALC 119* 11/14/2014 1244    Additional studies/ records that were reviewed today include:  none    ASSESSMENT:    1. Aortic valve disorder   2. Bilateral carotid artery stenosis   3. Thoracic aortic aneurysm without rupture ([place])   4. PFO (patent foramen ovale)   5. Hyperlipidemia LDL goal <70   6. Elevated BP      PLAN:  In order of problems listed above:  1. Aortic valve disorder - s/p mechanical AVR in 2001 for ? Etiology (  possible bicuspid AV but patient did not know).  Continue warfarin/ASA. 2. Mild bilateral carotid artery stenosis 1-39% - followup dopplers in 2 years.  Continue ASA/statin.  3. Ascending thoracic aortic aneurysm 4.4 x 4.5cm by CT- followed by Dr. Roxan Hockey.  Repeat chest CT angio to assess for progression next week.  He sees Dr. Roxan Hockey next week.  4. PFO - found at time of TEE for workup of CVA.  Continue coumadin. 5. Hyperlipidemia - LDL was above goal at last assessment a year ago.  Recheck FLP and ALT. 6. Elevated BP - I have asked him to check his BP daily for a week and call with results.  His BP goal with his aneurysm is 130/29mmHg or lower.    Medication Adjustments/Labs and Tests Ordered: Current medicines are reviewed at length with the patient today.  Concerns regarding medicines are outlined above.  Medication changes, Labs and Tests ordered today are listed in the Patient Instructions below.  Patient Instructions  Medication Instructions:  Your physician recommends that you continue on your current medications as directed. Please refer to the Current Medication list given to you today.   Labwork: Your physician recommends that you return for FASTING lab work.  Testing/Procedures: None  Follow-Up: Your physician wants you to follow-up in: 1 year with Dr. Radford Pax. You will receive a reminder letter in the mail two months in advance. If you don't receive a letter, please call our office to schedule the follow-up appointment.   Any Other Special Instructions Will Be Listed Below (If Applicable). Please check your BLOOD PRESSURE daily for a week and call with results.    If you need a refill on your cardiac medications before your next appointment, please call your pharmacy.       Signed, Fransico Him, MD  12/05/2015 8:39 AM    [address], [place], [place]  [address] Phone: [phone]; Fax: [phone]

## 2015-12-06 ENCOUNTER — Other Ambulatory Visit (INDEPENDENT_AMBULATORY_CARE_PROVIDER_SITE_OTHER): Payer: 59 | Admitting: *Deleted

## 2015-12-06 DIAGNOSIS — E785 Hyperlipidemia, unspecified: Secondary | ICD-10-CM | POA: Diagnosis not present

## 2015-12-06 LAB — HEPATIC FUNCTION PANEL
ALBUMIN: 4.2 g/dL (ref 3.6–5.1)
ALT: 28 U/L (ref 9–46)
AST: 24 U/L (ref 10–35)
Alkaline Phosphatase: 39 U/L — ABNORMAL LOW (ref 40–115)
Bilirubin, Direct: 0.1 mg/dL (ref <=0.2)
Indirect Bilirubin: 0.4 mg/dL (ref 0.2–1.2)
TOTAL PROTEIN: 6.9 g/dL (ref 6.1–8.1)
Total Bilirubin: 0.5 mg/dL (ref 0.2–1.2)

## 2015-12-06 LAB — LIPID PANEL
CHOLESTEROL: 202 mg/dL — AB (ref 125–200)
HDL: 76 mg/dL (ref >=40)
LDL Cholesterol: 60 mg/dL (ref <130)
TRIGLYCERIDES: 331 mg/dL — AB (ref <150)
Total CHOL/HDL Ratio: 2.7 Ratio (ref <=5.0)
VLDL: 66 mg/dL — ABNORMAL HIGH (ref <30)

## 2015-12-10 ENCOUNTER — Encounter: Payer: Self-pay | Admitting: Thoracic Surgery (Cardiothoracic Vascular Surgery)

## 2015-12-10 ENCOUNTER — Ambulatory Visit
Admission: RE | Admit: 2015-12-10 | Discharge: 2015-12-10 | Disposition: A | Discharge status: Home / Self Care | Payer: 59 | Source: Ambulatory Visit | Attending: Thoracic Surgery (Cardiothoracic Vascular Surgery) | Admitting: Thoracic Surgery (Cardiothoracic Vascular Surgery)

## 2015-12-10 ENCOUNTER — Telehealth: Payer: Self-pay

## 2015-12-10 ENCOUNTER — Ambulatory Visit (INDEPENDENT_AMBULATORY_CARE_PROVIDER_SITE_OTHER): Payer: 59 | Admitting: Thoracic Surgery (Cardiothoracic Vascular Surgery)

## 2015-12-10 VITALS — BP 130/87 | HR 60 | Resp 20 | Ht 70.0 in | Wt 182.0 lb

## 2015-12-10 DIAGNOSIS — I7121 Aneurysm of the ascending aorta, without rupture: Secondary | ICD-10-CM

## 2015-12-10 DIAGNOSIS — I712 Thoracic aortic aneurysm, without rupture, unspecified: Secondary | ICD-10-CM

## 2015-12-10 DIAGNOSIS — R030 Elevated blood-pressure reading, without diagnosis of hypertension: Principal | ICD-10-CM

## 2015-12-10 DIAGNOSIS — IMO0001 Reserved for inherently not codable concepts without codable children: Secondary | ICD-10-CM

## 2015-12-10 MED ORDER — IOPAMIDOL (ISOVUE-370) INJECTION 76%
75.0000 mL | Freq: Once | INTRAVENOUS | Status: AC | PRN
  Administered 2015-12-10: 75 mL via INTRAVENOUS

## 2015-12-10 NOTE — Telephone Encounter (Signed)
Burtis Junes, NP   Sent: Tue December 10, 2015 1:08 PM    To: Theodoro Parma, RN    Cc: Melrose Nakayama, MD        Message     Valetta Fuller,        I have reviewed Dr. Leonarda Salon note on the above patient. He needs blood pressure medicine.         Start Lisinopril 5 mg a day - first dose at night and then one a day.    BMET in one week.    BP diary with update in one week as well.      Left message for patient to call back.

## 2015-12-10 NOTE — Progress Notes (Signed)
[address]       [place] [address]             [phone]       HPI: Tracy Marshall returns today for 6 month follow-up of his ascending aortic aneurysm.  He is a 65 year old gentleman with a history of mechanical aortic valve replacement in 2001 in [place]. [facility]).  He moved to [place] a few years ago for work but is now retired.  He was in his usual state of health until last winter. He was visiting with family and they noticed a left facial droop. An MR showed bilateral small strokes. He has had an extensive workup for that including carotid duplex, transesophageal echocardiography, and CT angiography. Carotid duplex showed a 1-39% stenosis on the right and a 40-59% stenosis on the left side. TEE showed the mechanical aortic valve prosthesis was working normally with mild aortic insufficiency. There was a small PFO. There was no intracardiac source identified. No plaque was noted in the arch or ascending aorta. His CT angiogram showed an ascending aortic aneurysm measured 4.5 cm at the level of the right pulmonary artery.  I last saw him in December 2016. In the interim since that time he had a lot of trouble with back pain. He recently completed a prednisone taper, which did help some. He has not had any chest pain, shortness of breath, orthopnea, or peripheral edema. He's not had any presyncopal or syncopal spells.  Past Medical History  Diagnosis Date  . Hyperlipidemia   . TIA (transient ischemic attack)   . Atypical moles     melanomna  . Dizziness   . Cluster headaches   . Joint pain   . Gout   . Melanoma ([place])     hx of melanoma and multiple basal cell carcinomas Dr Tonia Brooms  . GERD (gastroesophageal reflux disease)   . Diverticulosis   . Fatty liver     hx of elevated hepatic transaminases-negative workup in 2009 except for U/S suggesting fatty liver  . Episodic recurrent vertigo 2002 & 2005    carotid dopplers w no  clinically significant stenosis  . Positive ANA (antinuclear antibody)   . Basal cell carcinoma   . LBBB (left bundle branch block)   . S/P AVR (aortic valve replacement)     mechanical  . Chronic anticoagulation     for mechanical AVR  . CVA (cerebral vascular accident) ([place]) 07/24/2014     MRI indicating 2 punctate foci of acute infarction in the l  . Bilateral carotid artery stenosis 08/03/2014    1-39% right and 40-59% left carotid artery stenosis  . Arthritis   . PFO (patent foramen ovale) 12/05/2015  . Hyperlipidemia LDL goal <70 12/05/2015      Current Outpatient Prescriptions  Medication Sig Dispense Refill  . amoxicillin (AMOXIL) 500 MG capsule Take 500 mg by mouth as directed. Before dental appointments    . aspirin 81 MG tablet Take 81 mg by mouth daily.    . colchicine 0.6 MG tablet Take 0.6 mg by mouth as needed (GOUT).     [name] diazepam (VALIUM) 2 MG tablet TAKE 2-3 TABLETS BY MOUTH ONCE TO TWICE DAILY  0  . methocarbamol (ROBAXIN) 500 MG tablet Take 500 mg by mouth as directed.     . Multiple Vitamin (MULTIVITAMIN) capsule Take 1 capsule by mouth daily.      . pantoprazole (PROTONIX) 40 MG tablet daily.    [name]  pravastatin (PRAVACHOL) 40 MG tablet Take 1 tablet (40 mg total) by mouth daily. 90 tablet 3  . ranitidine (ZANTAC) 75 MG tablet Take 75 mg by mouth 2 (two) times daily.    [name] warfarin (COUMADIN) 5 MG tablet daily. 3 DAYS 5MG  4 DAYS 2.5 MG    . enoxaparin (LOVENOX) 100 MG/ML injection Inject 100 mg into the skin. Reported on 12/10/2015     No current facility-administered medications for this visit.    Physical Exam BP 130/87 mmHg  Pulse 60  Resp 20  Ht 5\' 10"  (1.778 m)  Wt 182 lb (82.555 kg)  BMI 26.11 kg/m2  SpO76 67% 65 year old man in no acute distress Alert and oriented 3 with no focal deficits Lungs clear with equal breath says bilaterally Cardiac regular rate and rhythm with a good valve click, no murmur  Diagnostic Tests: CT ANGIOGRAPHY CHEST WITH  CONTRAST  TECHNIQUE: Multidetector CT imaging of the chest was performed using the standard protocol during bolus administration of intravenous contrast. Multiplanar CT image reconstructions and MIPs were obtained to evaluate the vascular anatomy.  CONTRAST: 75 cc Isovue 370 IV  COMPARISON: 06/04/2015  FINDINGS: Vascular: Scattered atherosclerotic calcifications aorta, proximal great vessels. Aneurysmal dilatation ascending thoracic aorta, 4.6 x 4.4 cm image 53 previously 4.4 x 4.4 cm. No evidence of dissection. Prior AVR. Pulmonary arteries grossly patent on non targeted exam. Mild enlargement of cardiac chambers.  Mediastinum: No thoracic adenopathy. Esophagus unremarkable. Base of cervical region normal appearance.  Lungs/pleura: Minimal dependent atelectasis. Lungs otherwise clear. No pulmonary infiltrate, pleural effusion or pneumothorax.  Visualized upper abdomen: Post cholecystectomy. Otherwise unremarkable.  Musculoskeletal: No acute osseous findings.  Review of the MIP images confirms the above findings.  IMPRESSION: Aneurysmal dilatation ascending thoracic aorta1 4.4 x 4.6 cm slightly increased since the previous exam.   Electronically Signed  By: Lavonia Dana M.D.  On: 12/10/2015 09:26  I personally reviewed the CT chest. I think that there may have been a slight increase of a millimeter in size, but not significantly changed.  Impression: 65 year old man with a history of a mechanical AVR in 2001. He has an ascending aneurysm which is approximate 4.5 cm in diameter. It did measure slightly different than his scan in 2016 by about a millimeter. There is no indication for surgery at this time (growth greater than 5 mm in 6 months, or size greater than 5.5 cm).  I am concerned about his blood pressure. His blood pressure was mildly elevated today and he was elevated when he saw Dr. Radford Pax recently. I'm going to defer to her but my bias would be  to consider starting him on a low-dose of blood pressure medication.  Plan:  Return in 6 months CT chest.  I spent 15 minutes with Tracy Marshall during this visit Melrose Nakayama, MD [facility] [phone]

## 2015-12-11 MED ORDER — LISINOPRIL 5 MG PO TABS: 5.0000 mg | ORAL_TABLET | Freq: Every day | ORAL | Status: DC

## 2015-12-11 MED ORDER — FENOFIBRATE 145 MG PO TABS
145.0000 mg | ORAL_TABLET | Freq: Every day | ORAL | Status: DC
Start: 2015-12-11 — End: 2016-01-06

## 2015-12-11 NOTE — Telephone Encounter (Signed)
-----   Message from Sueanne Margarita, MD sent at 12/10/2015  3:19 PM EDT ----- Triglycerides elevated - please add Tricor 145mg  daily and repeat FLp and ALT in 8 weeks

## 2015-12-11 NOTE — Telephone Encounter (Signed)
Reiterated to patient the importance of keeping Coumadin appointments and informing them of new medications. He has an appointment next Tuesday and will notify them of med changes. He was grateful for call.

## 2015-12-11 NOTE — Telephone Encounter (Signed)
FYI- fenofibrate may increase INR.  His Coumadin is managed by PCP.  Would make sure that he has an appt within the next 2-4 weeks and if INR is elevated, maybe due to fenofibrate and require warfarin dose adjustment.

## 2015-12-11 NOTE — Telephone Encounter (Signed)
Informed patient of results and verbal understanding expressed.  Instructed patient to START LISINOPRIL 5 mg daily.  BMET scheduled for June 27. He understands to keep BP log and bring to lab for drop off. Instructed patient to START TRICOR 145 mg daily. FLP and ALT scheduled September 1. Patient agrees with treatment plan.

## 2015-12-11 NOTE — Telephone Encounter (Signed)
Follow up Call   Tracy Marshall is returning your call.

## 2015-12-17 ENCOUNTER — Telehealth: Payer: Self-pay | Admitting: Cardiology

## 2015-12-17 ENCOUNTER — Other Ambulatory Visit (INDEPENDENT_AMBULATORY_CARE_PROVIDER_SITE_OTHER): Payer: 59

## 2015-12-17 DIAGNOSIS — R030 Elevated blood-pressure reading, without diagnosis of hypertension: Secondary | ICD-10-CM

## 2015-12-17 DIAGNOSIS — IMO0001 Reserved for inherently not codable concepts without codable children: Secondary | ICD-10-CM

## 2015-12-17 LAB — BASIC METABOLIC PANEL
BUN: 17 mg/dL (ref 7–25)
CO2: 25 mmol/L (ref 20–31)
Calcium: 9.7 mg/dL (ref 8.6–10.3)
Chloride: 106 mmol/L (ref 98–110)
Creat: 1.05 mg/dL (ref 0.70–1.25)
Glucose, Bld: 89 mg/dL (ref 65–99)
Potassium: 4.7 mmol/L (ref 3.5–5.3)
Sodium: 139 mmol/L (ref 135–146)

## 2015-12-17 NOTE — Telephone Encounter (Signed)
Walk In pt form-BP Readings-Dropped off gave to [facility]

## 2015-12-27 ENCOUNTER — Other Ambulatory Visit: Payer: Self-pay

## 2015-12-27 ENCOUNTER — Other Ambulatory Visit: Payer: Self-pay | Admitting: *Deleted

## 2015-12-27 NOTE — Telephone Encounter (Signed)
fenofibrate (TRICOR) 145 MG tablet  Medication   Date: 12/11/2015  Department: [address] Office  Ordering/Authorizing: Sueanne Margarita, MD      Order Providers    Prescribing Provider Encounter Provider   Sueanne Margarita, MD Theodoro Parma, RN    Medication Detail      Disp Refills Start End     fenofibrate (TRICOR) 145 MG tablet 30 tablet 11 12/11/2015     Sig - Route: Take 1 tablet (145 mg total) by mouth daily. - Oral    E-Prescribing Status: Receipt confirmed by pharmacy (12/11/2015 10:17 AM EDT)     Pharmacy    [id number] [place],  - [address]   [facility] ref[facility] at [facility]. Call [phone] and move

## 2016-01-06 ENCOUNTER — Other Ambulatory Visit: Payer: Self-pay | Admitting: *Deleted

## 2016-01-06 MED ORDER — FENOFIBRATE 145 MG PO TABS: 145.0000 mg | ORAL_TABLET | Freq: Every day | ORAL | Status: AC

## 2016-01-06 MED ORDER — LISINOPRIL 5 MG PO TABS: 5.0000 mg | ORAL_TABLET | Freq: Every day | ORAL | Status: DC

## 2016-02-21 ENCOUNTER — Other Ambulatory Visit: Payer: 59

## 2016-02-26 ENCOUNTER — Other Ambulatory Visit: Payer: 59 | Admitting: *Deleted

## 2016-02-26 DIAGNOSIS — R030 Elevated blood-pressure reading, without diagnosis of hypertension: Secondary | ICD-10-CM

## 2016-02-26 DIAGNOSIS — IMO0001 Reserved for inherently not codable concepts without codable children: Secondary | ICD-10-CM

## 2016-02-26 DIAGNOSIS — E785 Hyperlipidemia, unspecified: Secondary | ICD-10-CM

## 2016-02-26 LAB — LIPID PANEL
Cholesterol: 149 mg/dL (ref 125–200)
HDL: 67 mg/dL (ref >=40)
LDL CALC: 59 mg/dL (ref <130)
Total CHOL/HDL Ratio: 2.2 Ratio (ref <=5.0)
Triglycerides: 115 mg/dL (ref <150)
VLDL: 23 mg/dL (ref <30)

## 2016-02-26 LAB — ALT: ALT: 16 U/L (ref 9–46)

## 2016-03-18 ENCOUNTER — Ambulatory Visit (INDEPENDENT_AMBULATORY_CARE_PROVIDER_SITE_OTHER): Payer: 59 | Admitting: Pharmacist Clinician (PhC)/ Clinical Pharmacy Specialist

## 2016-03-18 DIAGNOSIS — Z7901 Long term (current) use of anticoagulants: Secondary | ICD-10-CM | POA: Diagnosis not present

## 2016-03-18 DIAGNOSIS — Z954 Presence of other heart-valve replacement: Secondary | ICD-10-CM

## 2016-03-18 DIAGNOSIS — Z952 Presence of prosthetic heart valve: Secondary | ICD-10-CM

## 2016-03-18 LAB — POCT INR: INR: 1.5

## 2016-04-14 ENCOUNTER — Other Ambulatory Visit: Payer: Self-pay | Admitting: Neurological Surgery

## 2016-04-14 DIAGNOSIS — M5414 Radiculopathy, thoracic region: Secondary | ICD-10-CM

## 2016-04-16 ENCOUNTER — Ambulatory Visit
Admission: RE | Admit: 2016-04-16 | Discharge: 2016-04-16 | Disposition: A | Discharge status: Home / Self Care | Payer: 59 | Source: Ambulatory Visit | Attending: Neurological Surgery | Admitting: Neurological Surgery

## 2016-04-16 DIAGNOSIS — M5414 Radiculopathy, thoracic region: Secondary | ICD-10-CM

## 2016-05-01 ENCOUNTER — Other Ambulatory Visit: Payer: Self-pay | Admitting: *Deleted

## 2016-05-01 DIAGNOSIS — I712 Thoracic aortic aneurysm, without rupture, unspecified: Secondary | ICD-10-CM

## 2016-06-11 ENCOUNTER — Other Ambulatory Visit: Payer: Self-pay | Admitting: Thoracic Surgery (Cardiothoracic Vascular Surgery)

## 2016-06-16 ENCOUNTER — Encounter: Payer: Self-pay | Admitting: Thoracic Surgery (Cardiothoracic Vascular Surgery)

## 2016-06-16 ENCOUNTER — Ambulatory Visit
Admission: RE | Admit: 2016-06-16 | Discharge: 2016-06-16 | Disposition: A | Discharge status: Home / Self Care | Payer: 59 | Source: Ambulatory Visit | Attending: Thoracic Surgery (Cardiothoracic Vascular Surgery) | Admitting: Thoracic Surgery (Cardiothoracic Vascular Surgery)

## 2016-06-16 ENCOUNTER — Ambulatory Visit (INDEPENDENT_AMBULATORY_CARE_PROVIDER_SITE_OTHER): Payer: 59 | Admitting: Thoracic Surgery (Cardiothoracic Vascular Surgery)

## 2016-06-16 VITALS — BP 115/70 | HR 45 | Resp 20 | Ht 70.0 in | Wt 165.0 lb

## 2016-06-16 DIAGNOSIS — I712 Thoracic aortic aneurysm, without rupture, unspecified: Secondary | ICD-10-CM

## 2016-06-16 MED ORDER — IOPAMIDOL (ISOVUE-370) INJECTION 76%
75.0000 mL | Freq: Once | INTRAVENOUS | Status: AC | PRN
  Administered 2016-06-16: 75 mL via INTRAVENOUS

## 2016-06-16 NOTE — Progress Notes (Signed)
Thanks

## 2016-06-16 NOTE — Progress Notes (Signed)
[address]       [place] [address]             [phone]       HPI: Tracy Marshall returns today for follow up of his ascending aneurysm  He is a 65 year old gentleman with a history of mechanical aortic valve replacement in 2001 in [place]. Tracy Marshall).  He moved to [place] a few years ago for work but is now retired.  In early 2016 he developed a left facial droop and a MR showed bilateral small strokes. He had an extensive workup, including carotid duplex, transesophageal echocardiography, and CT angiography. Carotid duplex showed a 1-39% stenosis on the right and a 40-59% stenosis on the left side. TEE showed the mechanical aortic valve prosthesis was working normally with mild aortic insufficiency. There was a small PFO. There was no intracardiac source identified. No plaque was noted in the arch or ascending aorta. A CT angiogram showed an ascending aortic aneurysm measured 4.5 cm at the level of the right pulmonary artery. I have followed him since that time.  I saw him in the office in June 2017. Radiology read the scan as showing a 4.4 x 4.6 cm ascending aneurysm. I thought that was an overestimate, but asked him to return again today with a repeat CT to be sure there wasn't any ongoing enlargement.  His blood pressure was elevated at his last visit. Tracy Marshall subsequently started him on lisinopril 5 mg a day.  He has been feeling well. He denies any chest pain, shortness of breath, dizziness, weakness, or other neurologic symptoms.  Past Medical History:  Diagnosis Date  . Arthritis   . Atypical moles    melanomna  . Basal cell carcinoma   . Bilateral carotid artery stenosis 08/03/2014   1-39% right and 40-59% left carotid artery stenosis  . Chronic anticoagulation    for mechanical AVR  . Cluster headaches   . CVA (cerebral vascular accident) ([place]) 07/24/2014    MRI indicating 2 punctate foci of acute infarction in the l    . Diverticulosis   . Dizziness   . Episodic recurrent vertigo 2002 & 2005   carotid dopplers w no clinically significant stenosis  . Fatty liver    hx of elevated hepatic transaminases-negative workup in 2009 except for U/S suggesting fatty liver  . GERD (gastroesophageal reflux disease)   . Gout   . Hyperlipidemia   . Hyperlipidemia LDL goal <70 12/05/2015  . Joint pain   . LBBB (left bundle branch block)   . Melanoma ([place])    hx of melanoma and multiple basal cell carcinomas Dr Tonia Brooms  . PFO (patent foramen ovale) 12/05/2015  . Positive ANA (antinuclear antibody)   . S/P AVR (aortic valve replacement)    mechanical  . TIA (transient ischemic attack)      Current Outpatient Prescriptions  Medication Sig Dispense Refill  . amoxicillin (AMOXIL) 500 MG capsule Take 500 mg by mouth as directed. Before dental appointments    . aspirin 81 MG tablet Take 81 mg by mouth daily.    . colchicine 0.6 MG tablet Take 0.6 mg by mouth as needed (GOUT).     [name] diazepam (VALIUM) 2 MG tablet TAKE 2-3 TABLETS BY MOUTH ONCE TO TWICE DAILY  0  . enoxaparin (LOVENOX) 100 MG/ML injection Inject 100 mg into the skin. Reported on 12/10/2015    . fenofibrate (TRICOR) 145 MG tablet Take 1  tablet (145 mg total) by mouth daily. 90 tablet 3  . HYDROcodone-acetaminophen (NORCO/VICODIN) 5-325 MG tablet Take 1 tablet by mouth every 6 (six) hours as needed for moderate pain (back pain).    [name] lisinopril (PRINIVIL,ZESTRIL) 5 MG tablet Take 1 tablet (5 mg total) by mouth daily. 90 tablet 2  . methocarbamol (ROBAXIN) 500 MG tablet Take 500 mg by mouth as directed.     . Multiple Vitamin (MULTIVITAMIN) capsule Take 1 capsule by mouth daily.      . pantoprazole (PROTONIX) 40 MG tablet daily.    . pravastatin (PRAVACHOL) 40 MG tablet Take 1 tablet (40 mg total) by mouth daily. 90 tablet 3  . ranitidine (ZANTAC) 75 MG tablet Take 75 mg by mouth 2 (two) times daily.    [name] warfarin (COUMADIN) 5 MG tablet daily. 3 DAYS 5MG  4  DAYS 2.5 MG     No current facility-administered medications for this visit.     Physical Exam BP 115/70   Pulse (!) 45   Resp 20   Ht 5\' 10"  (1.778 m)   Wt 165 lb (74.8 kg)   BMI 23.68 kg/m  Well-appearing 65 year old man in no acute distress Alert and oriented 3 with no focal deficit No carotid bruits Cardiac regular rate and rhythm with a good valve click and 2/6 systolic murmur Lungs clear with equal breath sounds bilaterally  Diagnostic Tests: CT ANGIOGRAPHY CHEST WITH CONTRAST  TECHNIQUE: Multidetector CT imaging of the chest was performed using the standard protocol during bolus administration of intravenous contrast. Multiplanar CT image reconstructions and MIPs were obtained to evaluate the vascular anatomy.  CONTRAST:  75 mL Isovue 370.  Creatinine was obtained on site at [place] at [address]Results: Creatinine 1.1 mg/dL.  COMPARISON:  12/10/2015  FINDINGS: Cardiovascular: Left vertebral artery arises directly from the aortic arch. Aortic valve replacement is noted. Aneurysmal dilatation of the ascending aorta is noted. The aorta is dilated to 4.3 by 4.1 cm. This is slightly decreased in the interval from the prior exam but stable. The pulmonary artery shows a normal branching pattern. No filling defects to suggest pulmonary emboli are identified. No significant coronary calcifications are noted.  Mediastinum/Nodes: No significant hilar or mediastinal adenopathy is noted. The thoracic inlet is within normal limits. No axillary adenopathy is seen.  Lungs/Pleura: Lungs are clear. No pleural effusion or pneumothorax.  Upper Abdomen: No acute abnormality. Mild scarring is noted in the left kidney. Gallbladder has been surgically removed.  Musculoskeletal: Mild degenerative changes of the thoracic spine are noted.  Review of the MIP images confirms the above findings.  IMPRESSION: Relatively stable aneurysmal dilatation  of the ascending aorta. The measurements are slightly less on the current exam than on the previous exam.  No new focal abnormality is noted.   Electronically Signed   By: Tracy Marshall M.D.   On: 06/16/2016 11:12 I personally reviewed the CT images and concur with pHisoHex above. I do not think this is a true decrease in the size, but rather is a more accurate measurement by radiology.  Impression: Tracy Marshall is a 65 year old gentleman with a history of mechanical aortic valve replacement who has a small ascending aortic aneurysm. This measured slightly smaller on today's scan and previous. I think it is more reflection of the radiologist being more careful measurement today than previously.  Hypertension- blood pressure well controlled on lisinopril.  Bradycardia- his heart rate was 45 initially and 50 on repeat. He is asymptomatic. If  he notes any dizziness, lightheadedness, or fatigue he will contact Tracy Marshall.  Plan: Return in 6 months with CT angiogram of chest  Melrose Nakayama, MD [facility] [phone]

## 2016-08-04 ENCOUNTER — Emergency Department (HOSPITAL_COMMUNITY): Payer: 59

## 2016-08-04 ENCOUNTER — Emergency Department (HOSPITAL_COMMUNITY)
Admission: EM | Admit: 2016-08-04 | Discharge: 2016-08-04 | Payer: 59 | Attending: Emergency Medicine | Admitting: Emergency Medicine

## 2016-08-04 ENCOUNTER — Encounter (HOSPITAL_COMMUNITY): Payer: Self-pay

## 2016-08-04 ENCOUNTER — Encounter (HOSPITAL_COMMUNITY)
Admission: EM | Disposition: A | Discharge status: Other Acute Inpatient Hospital | Payer: Self-pay | Source: Home / Self Care | Attending: Emergency Medicine

## 2016-08-04 DIAGNOSIS — R29704 NIHSS score 4: Secondary | ICD-10-CM | POA: Diagnosis not present

## 2016-08-04 DIAGNOSIS — Z79899 Other long term (current) drug therapy: Secondary | ICD-10-CM | POA: Insufficient documentation

## 2016-08-04 DIAGNOSIS — Z7901 Long term (current) use of anticoagulants: Secondary | ICD-10-CM | POA: Insufficient documentation

## 2016-08-04 DIAGNOSIS — Z8673 Personal history of transient ischemic attack (TIA), and cerebral infarction without residual deficits: Secondary | ICD-10-CM | POA: Diagnosis not present

## 2016-08-04 DIAGNOSIS — I63531 Cerebral infarction due to unspecified occlusion or stenosis of right posterior cerebral artery: Secondary | ICD-10-CM | POA: Insufficient documentation

## 2016-08-04 DIAGNOSIS — Z7982 Long term (current) use of aspirin: Secondary | ICD-10-CM | POA: Insufficient documentation

## 2016-08-04 DIAGNOSIS — M6281 Muscle weakness (generalized): Secondary | ICD-10-CM | POA: Diagnosis present

## 2016-08-04 DIAGNOSIS — I639 Cerebral infarction, unspecified: Secondary | ICD-10-CM

## 2016-08-04 DIAGNOSIS — Z85820 Personal history of malignant melanoma of skin: Secondary | ICD-10-CM | POA: Insufficient documentation

## 2016-08-04 DIAGNOSIS — I63131 Cerebral infarction due to embolism of right carotid artery: Secondary | ICD-10-CM | POA: Insufficient documentation

## 2016-08-04 LAB — DIFFERENTIAL
BASOS ABS: 0 10*3/uL (ref 0.0–0.1)
BASOS PCT: 0 %
Eosinophils Absolute: 0.1 10*3/uL (ref 0.0–0.7)
Eosinophils Relative: 3 %
LYMPHS PCT: 37 %
Lymphs Abs: 2 10*3/uL (ref 0.7–4.0)
MONO ABS: 0.5 10*3/uL (ref 0.1–1.0)
MONOS PCT: 9 %
Neutro Abs: 2.8 10*3/uL (ref 1.7–7.7)
Neutrophils Relative %: 51 %

## 2016-08-04 LAB — COMPREHENSIVE METABOLIC PANEL
ALK PHOS: 27 U/L — AB (ref 38–126)
ALT: 15 U/L — AB (ref 17–63)
AST: 29 U/L (ref 15–41)
Albumin: 4 g/dL (ref 3.5–5.0)
Anion gap: 10 (ref 5–15)
BUN: 18 mg/dL (ref 6–20)
CALCIUM: 9.6 mg/dL (ref 8.9–10.3)
CHLORIDE: 107 mmol/L (ref 101–111)
CO2: 22 mmol/L (ref 22–32)
CREATININE: 1.11 mg/dL (ref 0.61–1.24)
GFR calc non Af Amer: >60 mL/min (ref >60)
Glucose, Bld: 120 mg/dL — ABNORMAL HIGH (ref 65–99)
Potassium: 3.7 mmol/L (ref 3.5–5.1)
SODIUM: 139 mmol/L (ref 135–145)
Total Bilirubin: 0.4 mg/dL (ref 0.3–1.2)
Total Protein: 6.6 g/dL (ref 6.5–8.1)

## 2016-08-04 LAB — CBC
HEMATOCRIT: 33 % — AB (ref 39.0–52.0)
Hemoglobin: 10.5 g/dL — ABNORMAL LOW (ref 13.0–17.0)
MCH: 25.5 pg — ABNORMAL LOW (ref 26.0–34.0)
MCHC: 31.8 g/dL (ref 30.0–36.0)
MCV: 80.1 fL (ref 78.0–100.0)
PLATELETS: 339 10*3/uL (ref 150–400)
RBC: 4.12 MIL/uL — AB (ref 4.22–5.81)
RDW: 15.1 % (ref 11.5–15.5)
WBC: 5.4 10*3/uL (ref 4.0–10.5)

## 2016-08-04 LAB — I-STAT CHEM 8, ED
BUN: 21 mg/dL — AB (ref 6–20)
CHLORIDE: 106 mmol/L (ref 101–111)
CREATININE: 1.1 mg/dL (ref 0.61–1.24)
Calcium, Ion: 1.16 mmol/L (ref 1.15–1.40)
GLUCOSE: 120 mg/dL — AB (ref 65–99)
HEMATOCRIT: 33 % — AB (ref 39.0–52.0)
Hemoglobin: 11.2 g/dL — ABNORMAL LOW (ref 13.0–17.0)
POTASSIUM: 3.6 mmol/L (ref 3.5–5.1)
Sodium: 141 mmol/L (ref 135–145)
TCO2: 24 mmol/L (ref 0–100)

## 2016-08-04 LAB — I-STAT TROPONIN, ED: Troponin i, poc: 0 ng/mL (ref 0.00–0.08)

## 2016-08-04 LAB — PROTIME-INR
INR: 2.39
Prothrombin Time: 26.5 seconds — ABNORMAL HIGH (ref 11.4–15.2)

## 2016-08-04 LAB — CBG MONITORING, ED: Glucose-Capillary: 117 mg/dL — ABNORMAL HIGH (ref 65–99)

## 2016-08-04 LAB — APTT: APTT: 32 s (ref 24–36)

## 2016-08-04 SURGERY — RADIOLOGY WITH ANESTHESIA
Anesthesia: General

## 2016-08-04 MED ORDER — IOPAMIDOL (ISOVUE-370) INJECTION 76%
INTRAVENOUS | Status: AC
  Administered 2016-08-04: 50 mL
  Filled 2016-08-04: qty 50

## 2016-08-04 NOTE — ED Triage Notes (Signed)
[facility]- pt coming from store after he had an episode of left side weakness. Bystander helped lower the patient to the ground. Pt still has left side weakness but is able to move the extremity. Some left side facial droop noted as well. Hx of TIA. BP 104/70, HR65, CBG 107

## 2016-08-04 NOTE — Consult Note (Signed)
NEURO HOSPITALIST CONSULT NOTE   Requesting physician: Dr. Venora Maples   Reason for Consult: code stroke   History obtained from:  Patient     HPI:                                                                                                                                          Beverly Bosio. is an 66 y.o. male with mechanical aortic valve on chronic anticoagulation, history of melanoma, history of TIA/CVA, hypertension who presented to the ED with sudden onset of left-sided weakness. He was at [employer] around noon shopping when he felt weak and collapsed on the floor. He did not lose consciousness but had a subsequent headache. He has been diagnosed with TIA/CVA about 2-3 years ago after he returned from vacation with a facial droop. He denies any changes in vision, dizziness, numbness/tingling, fever, chills, sick contacts, dyspnea, prior history of MI, Type 2 diabetes, ongoing tobacco or illicit drug use. His maternal relatives all had cardiac disease, some of which died prematurely. His wife notes his lifestyle has become more sedentary over the last 1-2 years after going on disability.   In the ED, CT head noted hyperdense basilar and proximal right MCA suggestive of acute thrombosis, and subsequent CTA head showed large vessel occlusion at the right ICA terminus extending to the right M1 and A1 segments.  Past Medical History:  Diagnosis Date  . Arthritis   . Atypical moles    melanomna  . Basal cell carcinoma   . Bilateral carotid artery stenosis 08/03/2014   1-39% right and 40-59% left carotid artery stenosis  . Chronic anticoagulation    for mechanical AVR  . Cluster headaches   . CVA (cerebral vascular accident) ([place]) 07/24/2014    MRI indicating 2 punctate foci of acute infarction in the l  . Diverticulosis   . Dizziness   . Episodic recurrent vertigo 2002 & 2005   carotid dopplers w no clinically significant stenosis  . Fatty liver     hx of elevated hepatic transaminases-negative workup in 2009 except for U/S suggesting fatty liver  . GERD (gastroesophageal reflux disease)   . Gout   . Hyperlipidemia   . Hyperlipidemia LDL goal <70 12/05/2015  . Joint pain   . LBBB (left bundle branch block)   . Melanoma ([place])    hx of melanoma and multiple basal cell carcinomas Dr Tonia Brooms  . PFO (patent foramen ovale) 12/05/2015  . Positive ANA (antinuclear antibody)   . S/P AVR (aortic valve replacement)    mechanical  . TIA (transient ischemic attack)     Past Surgical History:  Procedure Laterality Date  . APPENDECTOMY    . CARDIAC VALVE REPLACEMENT    . CHOLECYSTECTOMY    .  DENTAL SURGERY Left bone graft and extraction  . MELANOMA EXCISION     x3  . TEE WITHOUT CARDIOVERSION N/A 07/31/2014   Procedure: TRANSESOPHAGEAL ECHOCARDIOGRAM (TEE);  Surgeon: Sueanne Margarita, MD;  Location: [facility] ENDOSCOPY;  Service: Cardiovascular;  Laterality: N/A;  . TEE WITHOUT CARDIOVERSION N/A 10/01/2014   Procedure: TRANSESOPHAGEAL ECHOCARDIOGRAM (TEE);  Surgeon: Lelon Perla, MD;  Location: [facility] ENDOSCOPY;  Service: Cardiovascular;  Laterality: N/A;    Family History  Problem Relation Age of Onset  . Cancer Mother     melanoma, ovarian  . Cancer Father     prostate  . Melanoma    . Psoriasis    . Valvular heart disease Brother   . Scoliosis Sister     Family History: As noted in the hpi.  Social History:  reports that he has never smoked. He has never used smokeless tobacco. He reports that he drinks alcohol. He reports that he does not use drugs.  No Known Allergies  MEDICATIONS:                                                                                                                     I have reviewed the patient's current medications.   ROS:                                                                                                                                       History obtained from the patient As noted in the  HPI.   Blood pressure 94/78, pulse (!) 58, temperature 98.1 F (36.7 C), temperature source Oral, resp. rate 20, SpO2 97 %.   Neurologic Examination:                                                                                                      HEENT-  Normocephalic, no lesions, without obvious abnormality.  Normal external eye and conjunctiva.  Normal external nose, mucus membranes and septum.  Normal pharynx. Cardiovascular- systolic click best appreciated  at right upper sternal border, pulses palpable throughout   Lungs- chest clear, no wheezing, rales, normal symmetric air entry, Heart exam - S1, S2 normal, no murmur, no gallop, rate regular Extremities- no joint deformities, effusion, or inflammation  Neurological Examination Mental Status: Alert, oriented, thought content appropriate.  Speech fluent without evidence of aphasia.  Able to follow 3 step commands without difficulty. Cranial Nerves: II: Visual fields grossly normal, pupils equal, round, reactive to light and accommodation III,IV, VI: ptosis not present, difficulty tracking movements to left upper quadrant V,VII: asymmetric smile with left-sided droop, facial light touch sensation normal bilaterally VIII: hearing normal bilaterally IX,X: uvula rises symmetrically XI: bilateral shoulder shrug XII: midline tongue extension Motor: Right : Upper extremity   5/5    Left:     Upper extremity   3/5  Lower extremity   5/5     Lower extremity   3/5 Tone and bulk:normal tone throughout; no atrophy noted Sensory: Pinprick and light touch intact throughout, bilaterally Deep Tendon Reflexes: 2+ and symmetric throughout Plantars: Right: downgoing   Left: downgoing Cerebellar: normal finger-to-nose on right side though limited on left by weakness Gait: deferred      Lab Results: Basic Metabolic Panel:  Recent Labs Lab 08/04/16 1337 08/04/16 1401  NA 139 141  K 3.7 3.6  CL 107 106  CO2 22  --   GLUCOSE 120*  120*  BUN 18 21*  CREATININE 1.11 1.10  CALCIUM 9.6  --     Liver Function Tests:  Recent Labs Lab 08/04/16 1337  AST 29  ALT 15*  ALKPHOS 27*  BILITOT 0.4  PROT 6.6  ALBUMIN 4.0    CBC:  Recent Labs Lab 08/04/16 1337 08/04/16 1401  WBC 5.4  --   NEUTROABS 2.8  --   HGB 10.5* 11.2*  HCT 33.0* 33.0*  MCV 80.1  --   PLT 339  --      CBG:  Recent Labs Lab 08/04/16 1355  GLUCAP 117*    Microbiology: Results for orders placed or performed during the hospital encounter of 03/26/11  Surgical pcr screen     Status: None   Collection Time: 03/26/11  9:56 AM  Result Value Ref Range Status   MRSA, PCR NEGATIVE NEGATIVE Final   Staphylococcus aureus NEGATIVE NEGATIVE Final    Comment:        The Xpert SA Assay (FDA approved for NASAL specimens only), is one component of a comprehensive surveillance program.  It is not intended to diagnose infection nor to guide or monitor treatment.    Coagulation Studies:  Recent Labs  08/04/16 1337  LABPROT 26.5*  INR 2.39    Imaging: Ct Head Code Stroke W/o Cm  Result Date: 08/04/2016 CLINICAL DATA:  Code stroke. EXAM: CT HEAD WITHOUT CONTRAST TECHNIQUE: Contiguous axial images were obtained from the base of the skull through the vertex without intravenous contrast. COMPARISON:  MRI 07/21/2014 FINDINGS: Brain: Chronic infarct in the right frontal operculum and right insular cortex. Negative for acute infarct. Negative for hemorrhage or mass. Ventricle size normal with mild atrophy. No shift of the midline structures. Vascular: Increased density in the mid basilar which could be due to thrombosis. There is also focal increased density in the proximal right M1 segment which could be due to thrombus. CTA head and neck suggest for further evaluation. Skull: Negative Sinuses/Orbits: Negative Other: None ASPECTS ([place] Stroke Program Early CT Score) - Ganglionic level infarction (caudate, lentiform nuclei, internal capsule,  insula,  M1-M3 cortex): 7 - Supraganglionic infarction (M4-M6 cortex): 3 Total score (0-10 with 10 being normal): 10 IMPRESSION: 1. Negative for intracranial hemorrhage.  No acute infarct by CT 2. ASPECTS is 10 3. Chronic right frontal operculum and insular infarct. 4. Hyperdense basilar and proximal right MCA. This could represent acute thrombosis or chronic atherosclerotic disease. CTA head and neck suggest for further evaluation. These results were called by telephone at the time of interpretation on 08/04/2016 at 2:11 pm to Dr. Jola Schmidt , who verbally acknowledged these results. Electronically Signed   By: Franchot Gallo M.D.   On: 08/04/2016 14:12   Mr Harold Beeks. is an 66 y.o. man with mechanical aortic valve on chronic anticoagulation, history of melanoma, history of TIA/CVA, hypertension hospitalized for right MCA infarct 2/2 ICA thrombus.  Right MCA infarct 2/2 ICA thrombus: As noted in neuroimaging. Risk factors include atherosclerosis, hypertension. Though he is within the window for IV thrombolytic therapy, INR 2.39 is a contraindication.  -Consult IR for catheter-guided thrombolytic therapy  -Check echo, A1c, lipid panel, TSH -Continue neuro checks  -Consult PT/OT -Monitor on telemetry  Charlott Rakes, [place] [facility] Pager: [phone]  08/04/2016, 2:57 PM   Assessment/Plan:

## 2016-08-04 NOTE — Code Documentation (Signed)
66 y.o. Male arrives to [facility] ED via [facility]. Pt was in his normal state of health today and was out shopping when he had an acute onset of left sided hemiparesis. The pt was walking when the hemiparesis started which almost caused him to fall, however a local firefighter saw what was going on and helped the pt to the floor. EMS was then called and subsequently a code stroke was called. On arrival to [facility] ED, labs were drawn and pt taken for CT. Pt has hx of artificial heart valve for which he takes coumadin. CT negative for intracranial hemorrhage. No acute infarct by CT. ASPECTS is 10. Chronic right frontal operculum and insular infarct. Hyperdense basilar and proximal right MCA. IR MD notified of possible case. IR table not available at this time. IR MD states might not be able to get to this table for another hour or so. CTA positive for emergent LVO, with abrupt occlusion at the right ICA terminus, extending into the right M1 and A1 segments. Clot begins just distal to the right posterior communicating artery, which remains patent at this time. Of note, there is a fetal type right PCA. NIHSS 4. See EMR for code stroke times and NIHSS. tPA not given d/t coumadin and INR of 2.39. IR MD contacted with current scan results and states transfer is best option for this pt. The decision to tx the [facility] to [facility] for STAT embolectomy was made. Stroke RN and EDP arranged for tx of this pt. Dr Arizona Constable at [facility] set to accept pt at [facility]. [facility] notified of need for STAT transport. [facility] states no trucks available at this time. [facility] notified of need for transfer. [facility] at pt and ready to transfer. Pt in NAD, all questions answered and understanding tx is in his best interest. ED RN Jerene Pitch gave report to [facility] ED RN. EDP gave report to Dr. Arizona Constable and [facility] EDP. Delay in transfer d/t waiting on disc to be burned with CT and CTA. Ultimately [facility] EDP and Dr. Arizona Constable state to forgo CT disc and tx without.

## 2016-08-04 NOTE — ED Provider Notes (Signed)
[facility] DEPT Provider Note   CSN: [id number] Arrival date & time: 08/04/16  1353     History   Chief Complaint Chief Complaint  Patient presents with  . Code Stroke    HPI Tracy Marshall. is a 66 y.o. male.   HPI Patient presents to the emergency department with complaints of acute onset left sided arm and leg weakness.  Patient reports no prior history of diagnosed stroke but reports images obtained of his had before showed old strokes.  He had one sugar of 107 on EMS arrival and was brought to the emergency department as a code stroke.  Some left-sided facial droop noted by EMS as well.  The patient reports ongoing weakness of his left arm and left leg.  He does have a history of aortic valve is on Coumadin.  He reports normal compliance with his Coumadin.  No headache.  No chest pain or back pain or Arm pain.  Does have a history of ascending thoracic aneurysm which is being followed by serial CT imaging   Past Medical History:  Diagnosis Date  . Arthritis   . Atypical moles    melanomna  . Basal cell carcinoma   . Bilateral carotid artery stenosis 08/03/2014   1-39% right and 40-59% left carotid artery stenosis  . Chronic anticoagulation    for mechanical AVR  . Cluster headaches   . CVA (cerebral vascular accident) ([place]) 07/24/2014    MRI indicating 2 punctate foci of acute infarction in the l  . Diverticulosis   . Dizziness   . Episodic recurrent vertigo 2002 & 2005   carotid dopplers w no clinically significant stenosis  . Fatty liver    hx of elevated hepatic transaminases-negative workup in 2009 except for U/S suggesting fatty liver  . GERD (gastroesophageal reflux disease)   . Gout   . Hyperlipidemia   . Hyperlipidemia LDL goal <70 12/05/2015  . Joint pain   . LBBB (left bundle branch block)   . Melanoma ([place])    hx of melanoma and multiple basal cell carcinomas Dr Tonia Brooms  . PFO (patent foramen ovale) 12/05/2015  . Positive ANA (antinuclear antibody)    . S/P AVR (aortic valve replacement)    mechanical  . TIA (transient ischemic attack)     Patient Active Problem List   Diagnosis Date Noted  . PFO (patent foramen ovale) 12/05/2015  . Hyperlipidemia LDL goal <70 12/05/2015  . Elevated BP 12/05/2015  . Thoracic aortic aneurysm without rupture ([place]) 11/27/2014  . Bilateral carotid artery stenosis 08/03/2014  . CVA (cerebral vascular accident) ([place]) 07/24/2014  . LBBB (left bundle branch block)   . S/P AVR (aortic valve replacement)   . Atypical chest pain 06/01/2013  . Aortic valve disorder 06/01/2013  . Long term (current) use of anticoagulants 06/01/2013  . Postop check 04/28/2011  . 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 Melanoma right anterolateral thigh 02/10/2011    Past Surgical History:  Procedure Laterality Date  . APPENDECTOMY    . CARDIAC VALVE REPLACEMENT    . CHOLECYSTECTOMY    . DENTAL SURGERY Left bone graft and extraction  . MELANOMA EXCISION     x3  . TEE WITHOUT CARDIOVERSION N/A 07/31/2014   Procedure: TRANSESOPHAGEAL ECHOCARDIOGRAM (TEE);  Surgeon: Sueanne Margarita, MD;  Location: [facility] ENDOSCOPY;  Service: Cardiovascular;  Laterality: N/A;  . TEE WITHOUT CARDIOVERSION N/A 10/01/2014   Procedure: TRANSESOPHAGEAL ECHOCARDIOGRAM (TEE);  Surgeon: Lelon Perla, MD;  Location: [facility]  ENDOSCOPY;  Service: Cardiovascular;  Laterality: N/A;       Home Medications    Prior to Admission medications   Medication Sig Start Date End Date Taking? Authorizing Provider  amoxicillin (AMOXIL) 500 MG capsule Take 500 mg by mouth as directed. Before dental appointments 12/02/15   Historical Provider, MD  aspirin 81 MG tablet Take 81 mg by mouth daily.    Historical Provider, MD  colchicine 0.6 MG tablet Take 0.6 mg by mouth as needed (GOUT).     Historical Provider, MD  diazepam (VALIUM) 2 MG tablet TAKE 2-3 TABLETS BY MOUTH ONCE TO TWICE DAILY 11/03/15   Historical Provider, MD  enoxaparin (LOVENOX) 100 MG/ML  injection Inject 100 mg into the skin. Reported on 12/10/2015    Historical Provider, MD  fenofibrate (TRICOR) 145 MG tablet Take 1 tablet (145 mg total) by mouth daily. 01/06/16   Sueanne Margarita, MD  HYDROcodone-acetaminophen (NORCO/VICODIN) 5-325 MG tablet Take 1 tablet by mouth every 6 (six) hours as needed for moderate pain (back pain).    Historical Provider, MD  lisinopril (PRINIVIL,ZESTRIL) 5 MG tablet Take 1 tablet (5 mg total) by mouth daily. 01/06/16   Burtis Junes, NP  methocarbamol (ROBAXIN) 500 MG tablet Take 500 mg by mouth as directed.  11/17/15   Historical Provider, MD  Multiple Vitamin (MULTIVITAMIN) capsule Take 1 capsule by mouth daily.      Historical Provider, MD  pantoprazole (PROTONIX) 40 MG tablet daily. 01/26/11   Historical Provider, MD  pravastatin (PRAVACHOL) 40 MG tablet Take 1 tablet (40 mg total) by mouth daily. 01/21/15   Donika Keith Rake, DO  ranitidine (ZANTAC) 75 MG tablet Take 75 mg by mouth 2 (two) times daily.    Historical Provider, MD  warfarin (COUMADIN) 5 MG tablet daily. 3 DAYS 5MG  4 DAYS 2.5 MG 01/26/11   Historical Provider, MD    Family History Family History  Problem Relation Age of Onset  . Cancer Mother     melanoma, ovarian  . Cancer Father     prostate  . Melanoma    . Psoriasis    . Valvular heart disease Brother   . Scoliosis Sister     Social History Social History  Substance Use Topics  . Smoking status: Never Smoker  . Smokeless tobacco: Never Used  . Alcohol use 0.0 oz/week     Comment: moderate - 2-3 drinks about 3 times per week of wine, liquor, beer     Allergies   Patient has no known allergies.   Review of Systems Review of Systems  All other systems reviewed and are negative.    Physical Exam Updated Vital Signs BP 94/78 (BP Location: Left Arm)   Pulse (!) 58   Temp 98.1 F (36.7 C) (Oral)   Resp 20   SpO2 97%   Physical Exam  Constitutional: He is oriented to person, place, and time. He appears  well-developed and well-nourished.  HENT:  Head: Normocephalic and atraumatic.  Eyes: EOM are normal.  Neck: Normal range of motion.  Cardiovascular: Normal rate, regular rhythm, normal heart sounds and intact distal pulses.   Pulmonary/Chest: Effort normal and breath sounds normal. No respiratory distress.  Abdominal: Soft. He exhibits no distension. There is no tenderness.  Musculoskeletal: Normal range of motion.  Neurological: He is alert and oriented to person, place, and time.  Normal strength in right upper and right lower extremity major muscle groups.  Mild weakness of his left upper and left  lower extremity major muscle groups.  Mild left-sided facial droop.   Skin: Skin is warm and dry.  Psychiatric: He has a normal mood and affect. Judgment normal.  Nursing note and vitals reviewed.    ED Treatments / Results  Labs (all labs ordered are listed, but only abnormal results are displayed) Labs Reviewed  PROTIME-INR - Abnormal; Notable for the following:       Result Value   Prothrombin Time 26.5 (*)    All other components within normal limits  CBC - Abnormal; Notable for the following:    RBC 4.12 (*)    Hemoglobin 10.5 (*)    HCT 33.0 (*)    MCH 25.5 (*)    All other components within normal limits  COMPREHENSIVE METABOLIC PANEL - Abnormal; Notable for the following:    Glucose, Bld 120 (*)    ALT 15 (*)    Alkaline Phosphatase 27 (*)    All other components within normal limits  CBG MONITORING, ED - Abnormal; Notable for the following:    Glucose-Capillary 117 (*)    All other components within normal limits  I-STAT CHEM 8, ED - Abnormal; Notable for the following:    BUN 21 (*)    Glucose, Bld 120 (*)    Hemoglobin 11.2 (*)    HCT 33.0 (*)    All other components within normal limits  APTT  DIFFERENTIAL  I-STAT TROPOININ, ED    EKG  EKG Interpretation  Date/Time:  Tuesday August 04 2016 14:26:05 EST Ventricular Rate:  58 PR Interval:    QRS  Duration: 160 QT Interval:  510 QTC Calculation: 501 R Axis:   59 Text Interpretation:  Sinus rhythm Left bundle branch block not Confirmed by Marcel Sorter  MD, Lennette Bihari ([id number]) on 08/04/2016 2:54:53 PM       Radiology Ct Head Code Stroke W/o Cm  Result Date: 08/04/2016 CLINICAL DATA:  Code stroke. EXAM: CT HEAD WITHOUT CONTRAST TECHNIQUE: Contiguous axial images were obtained from the base of the skull through the vertex without intravenous contrast. COMPARISON:  MRI 07/21/2014 FINDINGS: Brain: Chronic infarct in the right frontal operculum and right insular cortex. Negative for acute infarct. Negative for hemorrhage or mass. Ventricle size normal with mild atrophy. No shift of the midline structures. Vascular: Increased density in the mid basilar which could be due to thrombosis. There is also focal increased density in the proximal right M1 segment which could be due to thrombus. CTA head and neck suggest for further evaluation. Skull: Negative Sinuses/Orbits: Negative Other: None ASPECTS ([place] Stroke Program Early CT Score) - Ganglionic level infarction (caudate, lentiform nuclei, internal capsule, insula, M1-M3 cortex): 7 - Supraganglionic infarction (M4-M6 cortex): 3 Total score (0-10 with 10 being normal): 10 IMPRESSION: 1. Negative for intracranial hemorrhage.  No acute infarct by CT 2. ASPECTS is 10 3. Chronic right frontal operculum and insular infarct. 4. Hyperdense basilar and proximal right MCA. This could represent acute thrombosis or chronic atherosclerotic disease. CTA head and neck suggest for further evaluation. These results were called by telephone at the time of interpretation on 08/04/2016 at 2:11 pm to Dr. Jola Schmidt , who verbally acknowledged these results. Electronically Signed   By: Franchot Gallo M.D.   On: 08/04/2016 14:12    Procedures Procedures (including critical care time)   ++++++++++++++++++++++++++++++++++++++++++++++++++++++++++++  CRITICAL CARE Performed by:  Hoy Morn Total critical care time: 31 minutes Critical care time was exclusive of separately billable procedures and treating other patients. Critical care was  necessary to treat or prevent imminent or life-threatening deterioration. Critical care was time spent personally by me on the following activities: development of treatment plan with patient and/or surrogate as well as nursing, discussions with consultants, evaluation of patient's response to treatment, examination of patient, obtaining history from patient or surrogate, ordering and performing treatments and interventions, ordering and review of laboratory studies, ordering and review of radiographic studies, pulse oximetry and re-evaluation of patient's condition.   +++++++++++++++++++++++++++++++++++++++++++++++++++++++++++++++++++  Medications Ordered in ED Medications  iopamidol (ISOVUE-370) 76 % injection (50 mLs  Contrast Given 08/04/16 1409)     Initial Impression / Assessment and Plan / ED Course  I have reviewed the triage vital signs and the nursing notes.  Pertinent labs & imaging results that were available during my care of the patient were reviewed by me and considered in my medical decision making (see chart for details).    Acute stroke symptoms. Code stroke. Taken back for emergent CT   2:36 PM Acute clot noted on CTA. Contacting Dr Lawana Pai about possibility of intervention  2:42 PM Spoke with Dr Lawana Pai, neurohospitalist who will review images and talk to neuroIR about possible intervention  Final Clinical Impressions(s) / ED Diagnoses   Final diagnoses:  Cerebrovascular accident (CVA), unspecified mechanism [facility])    New Prescriptions New Prescriptions   No medications on file     Jola Schmidt, MD 08/04/16 1459

## 2016-08-04 NOTE — ED Provider Notes (Signed)
3:37 PM Spoke with Dr. Arizona Constable, neuro IR at [place] who accepts the patient in transfer to the ER at [facility] and then to the [place], MD 08/04/16 1538

## 2016-08-04 NOTE — ED Provider Notes (Signed)
3:58 PM [facility] not available. [place] [facility] team is present and willing to take the patient. Pt being loaded onto the stretcher at this time.    Jola Schmidt, MD 08/04/16 [phone]

## 2016-08-04 NOTE — ED Notes (Signed)
Pt is going to [place] by [facility]; emergency traffic

## 2016-08-04 NOTE — ED Notes (Signed)
Stroke swallow screen not completed at this time due to patient is NPO and being transported for IR procedure.

## 2016-08-04 NOTE — ED Provider Notes (Signed)
3:31 PM Informed neuro IR has a patient on the table and requests transfer to another IR hospital. Merrily Pew, RN with stroke team has discussed the case with [place] return call at this time. [facility] is being notified of potential transfer   Jola Schmidt, MD 08/04/16 [phone]

## 2016-08-04 NOTE — ED Notes (Signed)
PT CBG was 117 nurse notified

## 2016-08-06 DIAGNOSIS — D649 Anemia, unspecified: Secondary | ICD-10-CM | POA: Insufficient documentation

## 2016-08-06 DIAGNOSIS — E876 Hypokalemia: Secondary | ICD-10-CM | POA: Insufficient documentation

## 2016-08-17 ENCOUNTER — Ambulatory Visit (INDEPENDENT_AMBULATORY_CARE_PROVIDER_SITE_OTHER): Payer: 59 | Admitting: Neurology

## 2016-08-17 ENCOUNTER — Encounter: Payer: Self-pay | Admitting: Neurology

## 2016-08-17 VITALS — BP 100/68 | HR 72 | Ht 70.0 in | Wt 176.4 lb

## 2016-08-17 DIAGNOSIS — I63411 Cerebral infarction due to embolism of right middle cerebral artery: Secondary | ICD-10-CM | POA: Diagnosis not present

## 2016-08-17 NOTE — Progress Notes (Signed)
Follow-up Visit   Date: 08/17/16    Tracy Marshall. MRN: [id number] DOB: [birth year]   Interim History: Tracy Marshall. is a 66 y.o. 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  The patient was accompanied to the clinic by wife who also provides collateral information.    History of present illness: In 2001, he developed an episode of slurred speech lasting a few minutes. This occurred several weeks after his valve replacement and was told he had a TIA. He had no residual deficits.  In December 2015, he was visiting his brother and a family member had noticed that he developed left facial droop. During the same time, he recalls being very ill and having fevers and was taking a lot of over-the-counter therapeutic medication. He underwent MRI brain in January which showed subacute right insula infarct with 2 foci of punctate left frontal cortex infarcts. There is evidence of old infarcts involving the right temporoparietal region also. These findings are most concerning for central embolic source. However, his TEE did not show any thrombus or unstable plaque of the aortic arch. His left facial weakness gradually improved over the next few months. Currently, he denies any neurological deficits.   His wife reports that she has noticed some cognitive slowing and mood changes, described as excitable and grumpy. Denies excessive yawning, or inappropriate crying/laughter.   In the past, he reports having spells of blurry vision, lasting about 10-min which is followed by a severe headache. Duration is about 1-2 days, occuring about once per month. He has associated phonophobia, no photophobia,  nausea/vomiting.  UPDATE 01/21/2015:  He reports having new midback pain and is scheduled for MRI of the thoracic spine tomorrow by Dr. Maxie Better. Denies radicular pain. No numbness/tingling or shooting pain.   He has been doing well from a stroke stand-point.  No new issues.  He no longer has any headaches.  He is still having problems with short-term memory.  Denies any problems managing finances, driving, or other IADLs.  He is depressed and exhausted from the number of doctors visits lately.   UPDATE 08/17/2016:  He is here for hospital discharge follow-up.  He presented to the ER on 2/13 with acute onset of left sided weakness, numbness, and vision changes. CT showed hyperdense basilar and proximal right MCA.  CTA head showed large vessel occlusion at the right ICA terminus extending to the right M1 and A1 segments.  Due to his INR 2.3, IVtPA was not administered and he was managed with mechanical embolectomy at [facility]. He felt almost immediate improvement and was able to walk the following day.  He did not have any PT/OT needs.  CT head the following day did not show any hemorrhage.  Echo did not show any thrombus, EF 55-60% and small PFO.  LDL 58 and he was continued on pravastatin 40mg  and Tricor 145mg .  He has been taking aspirin 81mg  for the past year.  This was restarted and lovenox as a bridge until his coumadin is therapeutic, goal 2.5 - 3.5.  He was discharged home and he has been doing well.   His wife feels that his cognition is doing much better over the past two weeks.  Medications:  Current Outpatient  Prescriptions on File Prior to Visit  Medication Sig Dispense Refill  . amoxicillin (AMOXIL) 500 MG capsule Take 2,000 mg by mouth See admin instructions. One hour prior to all dental appointments as a pre-treatment    . aspirin 81 MG tablet Take 81 mg by mouth daily.    . colchicine 0.6 MG tablet Take 0.6 mg by mouth See admin instructions. Once a day as needed for gout flares    . diazepam  (VALIUM) 2 MG tablet Take 2 mg by mouth at bedtime as needed (for sleep).     . fenofibrate (TRICOR) 145 MG tablet Take 1 tablet (145 mg total) by mouth daily. 90 tablet 3  . gabapentin (NEURONTIN) 300 MG capsule Take 900 mg by mouth 3 (three) times daily.    [name] HYDROcodone-acetaminophen (NORCO) 10-325 MG tablet Take 1 tablet by mouth 3 (three) times daily as needed (for back pain).     [name] lisinopril (PRINIVIL,ZESTRIL) 5 MG tablet Take 1 tablet (5 mg total) by mouth daily. 90 tablet 2  . methocarbamol (ROBAXIN) 500 MG tablet Take 500 mg by mouth daily as needed for muscle spasms.     . Multiple Vitamins-Minerals (ONE-A-DAY MENS 50+ ADVANTAGE) TABS Take 1 tablet by mouth daily.    . pantoprazole (PROTONIX) 40 MG tablet Take 40 mg by mouth daily.    . pravastatin (PRAVACHOL) 40 MG tablet Take 1 tablet (40 mg total) by mouth daily. 90 tablet 3  . ranitidine (ZANTAC) 75 MG tablet Take 75 mg by mouth 2 (two) times daily as needed for heartburn.     . warfarin (COUMADIN) 2.5 MG tablet Take 1.25-2.5 mg by mouth See admin instructions. 1.25 mg on Sunday evenings at 1830 (6:30 PM) and 2.5 mg on Mon/Tues/Wed/Thurs/Fri/Sat     No current facility-administered medications on file prior to visit.     Allergies: No Known Allergies  Review of Systems:  CONSTITUTIONAL: No fevers, chills, night sweats, or weight loss.  EYES: No visual changes or eye pain ENT: No hearing changes.  No history of nose bleeds.   RESPIRATORY: No cough, wheezing and shortness of breath.   CARDIOVASCULAR: Negative for chest pain, and palpitations.   GI: Negative for abdominal discomfort, blood in stools or black stools.  No recent change in bowel habits.   GU:  No history of incontinence.   MUSCLOSKELETAL: +history of joint pain or swelling.  No myalgias.   SKIN: Negative for lesions, rash, and itching.   ENDOCRINE: Negative for cold or heat intolerance, polydipsia or goiter.   PSYCH:  + depression or anxiety symptoms.   NEURO:  As Above.   Vital Signs:  BP 100/68   Pulse 72   Ht 5\' 10"  (1.778 m)   Wt 176 lb 7 oz (80 kg)   SpO2 92%   BMI 25.32 kg/m   Gen:  Well appearing CV:  Regular rate and rhythm, audible valve click Pulm:  Clear to auscultation Ext:  No edema  Neurological Exam: MENTAL STATUS including orientation to time, place, person, recent and remote memory, attention span and concentration, language, and fund of knowledge is intact.  Speech is not dysarthric.  CRANIAL NERVES: No visual field defects. Pupils equal round and reactive to light.  Normal conjugate, extra-ocular eye movements in all directions of gaze.  No ptosis. Normal facial sensation.  Face is symmetric. Palate elevates symmetrically.  Tongue is midline.  MOTOR:  Motor strength is 5/5 in all extremities. No atrophy, fasciculations or abnormal movements.  Mild left pronator drift.  Tone is normal.    MSRs:  Reflexes are 2+/4 throughout.  SENSORY:  Vibration and temperature reduced at the ankles bilaterally  COORDINATION/GAIT:  Normal finger-to- nose-finger.  Intact rapid alternating movements bilaterally.  Gait narrow based and stable. Tandem and stressed gait intact.   Data: MRI brain wo contrast 07/21/2014: 2 punctate foci of acute infarction in the left posterior frontal cortex without hemorrhage or mass effect. Late subacute infarction in the right insula and frontal opercular region. Old right temporoparietal cortical and subcortical infarction. Infarctions of different age in both hemispheres suggest embolic infarctions, possibly from the heart or ascending aorta or both carotid bifurcation regions.   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  Carotid Dopplers 08/03/2014: 1-39% right and 40-59% left carotid artery stenosis - repeat dopplers in 1 year  Cardiac  monitoring 12/2014:  Heart monitor with no PAF.  Sinus bradycardia as low as 40 bpm.  Normal sinus rhythm.  Sinus tachycardia as high as 120 bpm.  Frequent PAC's and PVC's, ventricular couplets, atrial couplets  Labs 11/14/2014:  Vitamin B12 822, HbA1c 6.0, LDL 119, Chol 210, HDL 43  IR Stroke 08/04/2016:  Right carotid terminus embolus removed with mechanical embolectomy. Intracranial atherosclerosis involving the distal right middle cerebral artery and posterior division origin.  IMPRESSION/PLAN: 1.  Embolic stroke manifesting with left hemiparesis and sensory loss in the setting of subtherapeutic INR s/p R carotid terminus embolectomy (08/04/2016) - clinically doing great with only mild left pronator drift on exam.    Previous history of embolic bihemispheric stroke (January 2016) manifesting with left facial droop in the setting of subtherapeutic INR and acute infection.   - Risk factors:  s/p mechanical aortic valve replacement (2004) on warfarin, AAA, small PFO  - Continue aspirin 81mg  and coumadin (goal 2.5 - 3.5) - bridging via Lovenox.  Keeping his INR therapeutic is key.   This drug combination is used in patients with mechanical heart valves with stroke/TIA who are deemed to be at high-risk for recurrent events.    Patient informed that risk of bleeding is increased on antiplatelet therapy and anticoagulation, including risk of ICH.   - Continue pravastatin 40mg  daily and Tricor 145mg  daily (LDL extremely well controlled 58)  - Blood pressure is well controlled  - Stroke warning signs discussed  2. Mild cognitive impairment - improved.  Patient does not wish to have formal neurocognitive testing  3.  Midback pain, followed by Pain Management  Return to clinic in 1 year   The duration of this appointment visit was 40 minutes of face-to-face time with the patient.  Greater than 50% of this time was spent in counseling, explanation of diagnosis, planning of further management, and  coordination of care.   Thank you for allowing me to participate in patient's care.  If I can answer any additional questions, I would be pleased to do so.    Sincerely,    Izekiel Flegel K. Posey Pronto, DO

## 2016-08-17 NOTE — Patient Instructions (Addendum)
Continue your medications as you are taking them  Return to clinic in 1 year

## 2016-08-22 ENCOUNTER — Encounter (HOSPITAL_BASED_OUTPATIENT_CLINIC_OR_DEPARTMENT_OTHER): Payer: Self-pay | Admitting: Adult Health

## 2016-08-22 ENCOUNTER — Emergency Department (HOSPITAL_BASED_OUTPATIENT_CLINIC_OR_DEPARTMENT_OTHER)
Admission: EM | Admit: 2016-08-22 | Discharge: 2016-08-22 | Disposition: A | Discharge status: Home / Self Care | Payer: 59 | Attending: Emergency Medicine | Admitting: Emergency Medicine

## 2016-08-22 DIAGNOSIS — K5900 Constipation, unspecified: Secondary | ICD-10-CM | POA: Insufficient documentation

## 2016-08-22 DIAGNOSIS — Z7901 Long term (current) use of anticoagulants: Secondary | ICD-10-CM | POA: Insufficient documentation

## 2016-08-22 DIAGNOSIS — Z7982 Long term (current) use of aspirin: Secondary | ICD-10-CM | POA: Insufficient documentation

## 2016-08-22 DIAGNOSIS — Z79899 Other long term (current) drug therapy: Secondary | ICD-10-CM | POA: Insufficient documentation

## 2016-08-22 DIAGNOSIS — Z85828 Personal history of other malignant neoplasm of skin: Secondary | ICD-10-CM | POA: Insufficient documentation

## 2016-08-22 DIAGNOSIS — R339 Retention of urine, unspecified: Secondary | ICD-10-CM | POA: Insufficient documentation

## 2016-08-22 DIAGNOSIS — R109 Unspecified abdominal pain: Secondary | ICD-10-CM | POA: Diagnosis present

## 2016-08-22 DIAGNOSIS — K649 Unspecified hemorrhoids: Secondary | ICD-10-CM | POA: Diagnosis not present

## 2016-08-22 LAB — URINALYSIS, ROUTINE W REFLEX MICROSCOPIC
Bilirubin Urine: NEGATIVE
GLUCOSE, UA: NEGATIVE mg/dL
HGB URINE DIPSTICK: NEGATIVE
Ketones, ur: NEGATIVE mg/dL
Leukocytes, UA: NEGATIVE
Nitrite: NEGATIVE
PROTEIN: NEGATIVE mg/dL
Specific Gravity, Urine: 1.024 (ref 1.005–1.030)
pH: 5.5 (ref 5.0–8.0)

## 2016-08-22 MED ORDER — FLEET ENEMA 7-19 GM/118ML RE ENEM
1.0000 | ENEMA | Freq: Once | RECTAL | Status: AC
  Administered 2016-08-22: 1 via RECTAL
  Filled 2016-08-22: qty 1

## 2016-08-22 MED ORDER — MILK AND MOLASSES ENEMA
1.0000 | Freq: Once | RECTAL | Status: DC
  Filled 2016-08-22: qty 250

## 2016-08-22 NOTE — ED Notes (Signed)
Pt had very small BM

## 2016-08-22 NOTE — ED Notes (Signed)
Went in to give soap suds enema and pt advised he wanted to try to have a BM again. Pt up to bedside commode.

## 2016-08-22 NOTE — ED Notes (Signed)
RN went to room to check on patient, curtain pulled and pt stated he was trying to use the bathroom. RN will recheck at a later time.

## 2016-08-22 NOTE — ED Provider Notes (Signed)
[facility] DEPT [facility] Provider Note   CSN: [id number] Arrival date & time: 08/22/16  1618   By signing my name below, I, Delton Prairie, attest that this documentation has been prepared under the direction and in the presence of Blanchie Dessert, MD  Electronically Signed: Delton Prairie, ED Scribe. 08/22/16. 4:59 PM.   History   Chief Complaint Chief Complaint  Patient presents with  . Abdominal Pain  . Urinary Retention   The history is provided by the patient. No language interpreter was used.   HPI Comments:  Tracy Kelner. is a 66 y.o. male, with a hx of CVA on 08/04/2016 who presents to the Emergency Department complaining of acute onset, moderate urinary retention onset yesterday. Pt also reports back pain for which he is taking hydrocodone, abdominal pain and constipation. Pt states his last bowel movement was 1 week ago and notes he feels like there is a lot of stool in his rectum. Pt states he had a catheter in place when he was admitted to the hospital after his stroke on 08/04/2016 but was not discharged home with one in place. He has been taking a laxative with no significant relief. Pt denies fevers, nausea, vomiting or any other associated symptoms.   Past Medical History:  Diagnosis Date  . Arthritis   . Atypical moles    melanomna  . Basal cell carcinoma   . Bilateral carotid artery stenosis 08/03/2014   1-39% right and 40-59% left carotid artery stenosis  . Chronic anticoagulation    for mechanical AVR  . Cluster headaches   . CVA (cerebral vascular accident) ([place]) 07/24/2014    MRI indicating 2 punctate foci of acute infarction in the l  . Diverticulosis   . Dizziness   . Episodic recurrent vertigo 2002 & 2005   carotid dopplers w no clinically significant stenosis  . Fatty liver    hx of elevated hepatic transaminases-negative workup in 2009 except for U/S suggesting fatty liver  . GERD (gastroesophageal reflux disease)   . Gout   . Hyperlipidemia   .  Hyperlipidemia LDL goal <70 12/05/2015  . Joint pain   . LBBB (left bundle branch block)   . Melanoma ([place])    hx of melanoma and multiple basal cell carcinomas Dr Tonia Brooms  . PFO (patent foramen ovale) 12/05/2015  . Positive ANA (antinuclear antibody)   . S/P AVR (aortic valve replacement)    mechanical  . TIA (transient ischemic attack)     Patient Active Problem List   Diagnosis Date Noted  . PFO (patent foramen ovale) 12/05/2015  . Hyperlipidemia LDL goal <70 12/05/2015  . Elevated BP 12/05/2015  . Thoracic aortic aneurysm without rupture ([place]) 11/27/2014  . Bilateral carotid artery stenosis 08/03/2014  . CVA (cerebral vascular accident) ([place]) 07/24/2014  . LBBB (left bundle branch block)   . S/P AVR (aortic valve replacement)   . Atypical chest pain 06/01/2013  . Aortic valve disorder 06/01/2013  . Long term (current) use of anticoagulants 06/01/2013  . Postop check 04/28/2011  . 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 Melanoma right anterolateral thigh 02/10/2011    Past Surgical History:  Procedure Laterality Date  . APPENDECTOMY    . CARDIAC VALVE REPLACEMENT    . CHOLECYSTECTOMY    . DENTAL SURGERY Left bone graft and extraction  . MELANOMA EXCISION     x3  . TEE WITHOUT CARDIOVERSION N/A 07/31/2014   Procedure: TRANSESOPHAGEAL ECHOCARDIOGRAM (TEE);  Surgeon: Sueanne Margarita, MD;  Location: [facility] ENDOSCOPY;  Service: Cardiovascular;  Laterality: N/A;  . TEE WITHOUT CARDIOVERSION N/A 10/01/2014   Procedure: TRANSESOPHAGEAL ECHOCARDIOGRAM (TEE);  Surgeon: Lelon Perla, MD;  Location: [facility] ENDOSCOPY;  Service: Cardiovascular;  Laterality: N/A;     Home Medications    Prior to Admission medications   Medication Sig Start Date End Date Taking? Authorizing Provider  amoxicillin (AMOXIL) 500 MG capsule Take 2,000 mg by mouth See admin instructions. One hour prior to all dental appointments as a pre-treatment 12/02/15   Historical Provider, MD  aspirin 81 MG tablet  Take 81 mg by mouth daily.    Historical Provider, MD  colchicine 0.6 MG tablet Take 0.6 mg by mouth See admin instructions. Once a day as needed for gout flares    Historical Provider, MD  diazepam (VALIUM) 2 MG tablet Take 2 mg by mouth at bedtime as needed (for sleep).  11/03/15   Historical Provider, MD  Enoxaparin Sodium (LOVENOX [place]) Inject into the skin.    Historical Provider, MD  fenofibrate (TRICOR) 145 MG tablet Take 1 tablet (145 mg total) by mouth daily. 01/06/16   Sueanne Margarita, MD  gabapentin (NEURONTIN) 300 MG capsule Take 900 mg by mouth 3 (three) times daily. 07/30/16 07/30/17  Historical Provider, MD  HYDROcodone-acetaminophen (NORCO) 10-325 MG tablet Take 1 tablet by mouth 3 (three) times daily as needed (for back pain).  07/01/16   Historical Provider, MD  lisinopril (PRINIVIL,ZESTRIL) 5 MG tablet Take 1 tablet (5 mg total) by mouth daily. 01/06/16   Burtis Junes, NP  methocarbamol (ROBAXIN) 500 MG tablet Take 500 mg by mouth daily as needed for muscle spasms.  11/17/15   Historical Provider, MD  Multiple Vitamins-Minerals (ONE-A-DAY MENS 50+ ADVANTAGE) TABS Take 1 tablet by mouth daily.    Historical Provider, MD  pantoprazole (PROTONIX) 40 MG tablet Take 40 mg by mouth daily. 01/26/11   Historical Provider, MD  pravastatin (PRAVACHOL) 40 MG tablet Take 1 tablet (40 mg total) by mouth daily. 01/21/15   Donika Keith Rake, DO  ranitidine (ZANTAC) 75 MG tablet Take 75 mg by mouth 2 (two) times daily as needed for heartburn.     Historical Provider, MD  warfarin (COUMADIN) 2.5 MG tablet Take 1.25-2.5 mg by mouth See admin instructions. 1.25 mg on Sunday evenings at 1830 (6:30 PM) and 2.5 mg on Mon/Tues/Wed/Thurs/Fri/Sat 07/27/16   Historical Provider, MD    Family History Family History  Problem Relation Age of Onset  . Cancer Mother     melanoma, ovarian  . Cancer Father     prostate  . Melanoma    . Psoriasis    . Valvular heart disease Brother   . Scoliosis Sister     Social  History Social History  Substance Use Topics  . Smoking status: Never Smoker  . Smokeless tobacco: Never Used  . Alcohol use 0.0 oz/week     Comment: moderate - 2-3 drinks about 3 times per week of wine, liquor, beer     Allergies   Patient has no known allergies.   Review of Systems Review of Systems  Constitutional: Negative for fever.  Gastrointestinal: Positive for abdominal pain and constipation. Negative for nausea and vomiting.  Genitourinary: Positive for difficulty urinating.  All other systems reviewed and are negative.  Physical Exam Updated Vital Signs BP 112/88   Pulse 103   Temp 97.8 F (36.6 C) (Oral)   Resp 22   Wt 165 lb (74.8 kg)   SpO2  100%   BMI 23.68 kg/m   Physical Exam  Constitutional: He is oriented to person, place, and time. He appears well-developed and well-nourished. No distress.  Appears uncomfortable   HENT:  Head: Normocephalic and atraumatic.  Eyes: Conjunctivae are normal.  Cardiovascular: Normal rate.   Pulmonary/Chest: Effort normal.  Abdominal: He exhibits no distension.  Lower abdominal discomfort in suprapubic region   Genitourinary:  Genitourinary Comments: Rectal hemorrhoids and fecal impaction.   Neurological: He is alert and oriented to person, place, and time.  Skin: Skin is warm and dry.  Psychiatric: He has a normal mood and affect.  Nursing note and vitals reviewed.  ED Treatments / Results  DIAGNOSTIC STUDIES:  Oxygen Saturation is 100% on RA, normal by my interpretation.    COORDINATION OF CARE:  4:54 PM Discussed treatment plan with pt at bedside and pt agreed to plan.  Labs (all labs ordered are listed, but only abnormal results are displayed) Labs Reviewed  URINALYSIS, ROUTINE W REFLEX MICROSCOPIC    EKG  EKG Interpretation None       Radiology No results found.  Procedures Procedures (including critical care time)  Medications Ordered in ED Medications - No data to display   Initial  Impression / Assessment and Plan / ED Course  I have reviewed the triage vital signs and the nursing notes.  Pertinent labs & imaging results that were available during my care of the patient were reviewed by me and considered in my medical decision making (see chart for details).     Patient is a 66 year old male with multiple medical problems recently discharged from [facility] after having a stroke presenting today with inability to urinate for the last 18 hours as well as severe constipation. Patient has not had a bowel movement in 1 week. He has been taking laxatives for the last 2-3 days but is still unable to have a bowel movement. He is complaining of severe abdominal discomfort and rectal pain.   Patient denies any nausea or vomiting. He has still been eating. On exam patient has a fecal impaction with some stool removed manually but then was given an enema. He was holding a significant amount of urine in his bladder as well and in and out cath was performed. No evidence of UTI. Will recheck after enema to ensure he has had a large bowel movement and is able to void independently.  After enema patient did have a large bowel movement however he was unable to void independently and a Foley catheter was placed. He will follow up with [facility] for urinary retention. Final Clinical Impressions(s) / ED Diagnoses   Final diagnoses:  Constipation, unspecified constipation type  Urinary retention    New Prescriptions Discharge Medication List as of 08/22/2016 10:07 PM     I personally performed the services described in this documentation, which was scribed in my presence.  The recorded information has been reviewed and considered.     Blanchie Dessert, MD 08/23/16 [phone]

## 2016-08-22 NOTE — ED Notes (Signed)
Foley Catheter education given to pt

## 2016-08-22 NOTE — ED Notes (Signed)
Pt informed RN that he feels "some better, and I think I'm past the crisis point now".

## 2016-08-22 NOTE — ED Notes (Signed)
Dr Plunkett in room with pt now. 

## 2016-08-22 NOTE — ED Triage Notes (Addendum)
Presents with inability to void since last night, abdominal pain and constipation for one week, just D/ c from [facility] on blood thinner after CVA in late Feb. PT is pale and c/o severe abdominal pain.

## 2016-08-26 ENCOUNTER — Telehealth: Payer: Self-pay | Admitting: Cardiology

## 2016-08-26 NOTE — Telephone Encounter (Signed)
New message    Pt is calling because he is interested in obtaining a home INR kit and states he needs some help.

## 2016-08-26 NOTE — Telephone Encounter (Signed)
Patient checks INR with his PCP - Dr. Orpah Melter. He gets monthly checks unless he "falls off the bandwagon."  He spoke with Dr. Olen Pel and they agreed a home INR checking device might be beneficial for him to prevent this from happening. Dr. Olen Pel does not know the name of any companies who supply home INR kits and would like some suggestions from [facility].  The patient understands he will be called tomorrow with some suggestions for his PCP.

## 2016-08-27 NOTE — Telephone Encounter (Signed)
Informed patient of RPH's recommendation for [employer] home monitoring. He understands his PCP can search for it online and there are forms available to help with patient enrollment.  Forwarded to PCP.

## 2016-08-27 NOTE — Telephone Encounter (Signed)
Our patients primarily use [employer]. His PCP can search for [employer] home monitoring and there are forms online available to help with patient enrollment.

## 2016-09-08 DIAGNOSIS — G8929 Other chronic pain: Secondary | ICD-10-CM | POA: Insufficient documentation

## 2016-09-08 DIAGNOSIS — M5414 Radiculopathy, thoracic region: Secondary | ICD-10-CM | POA: Insufficient documentation

## 2016-09-08 DIAGNOSIS — G894 Chronic pain syndrome: Secondary | ICD-10-CM | POA: Insufficient documentation

## 2016-09-22 ENCOUNTER — Encounter: Payer: Self-pay | Admitting: Hematology

## 2016-09-25 ENCOUNTER — Encounter: Payer: Self-pay | Admitting: Cardiology

## 2016-09-28 ENCOUNTER — Telehealth: Payer: Self-pay | Admitting: *Deleted

## 2016-09-28 NOTE — Telephone Encounter (Signed)
"  I received a letter to call to confirm my appointment there.  I will see you next week."   Nurse instructions to bring list of medications, valet parking free and to arrive early to register for this appointment.  Denies questions.

## 2016-10-02 DIAGNOSIS — H9113 Presbycusis, bilateral: Secondary | ICD-10-CM | POA: Insufficient documentation

## 2016-10-08 ENCOUNTER — Ambulatory Visit (HOSPITAL_BASED_OUTPATIENT_CLINIC_OR_DEPARTMENT_OTHER): Payer: 59 | Admitting: Hematology

## 2016-10-08 ENCOUNTER — Encounter: Payer: Self-pay | Admitting: Hematology

## 2016-10-08 VITALS — BP 90/57 | HR 70 | Temp 97.8°F | Resp 18 | Ht 70.0 in | Wt 178.3 lb

## 2016-10-08 DIAGNOSIS — Z01818 Encounter for other preprocedural examination: Secondary | ICD-10-CM

## 2016-10-08 DIAGNOSIS — D6859 Other primary thrombophilia: Secondary | ICD-10-CM | POA: Diagnosis not present

## 2016-10-08 DIAGNOSIS — I82509 Chronic embolism and thrombosis of unspecified deep veins of unspecified lower extremity: Secondary | ICD-10-CM | POA: Diagnosis not present

## 2016-10-08 NOTE — Progress Notes (Signed)
[name]    HEMATOLOGY/ONCOLOGY CONSULTATION NOTE  Date of Service: 10/08/2016  Patient Care Team: Orpah Melter, MD as PCP - General (Family Medicine)  CHIEF COMPLAINTS/PURPOSE OF CONSULTATION:  Anticoagulation recommendations for possible nerve ablation procedure for pain control   HISTORY OF PRESENTING ILLNESS:  Tracy Abbey. is a wonderful 66 y.o. male who has been referred to [place] by Dr Orpah Melter, MD  for anticoagulation recommendation for possible nerve ablation procedure in his thoracic spine for pain management.  Patient has a history of hypertension, dyslipidemia, gout, systolic cardiac myopathy with an ejection fraction of 4 45-50% , thoracic aortic aneurysm being followed by Dr. Roxan Hockey ,mechanical aortic valve replacement on chronic anticoagulation with Coumadin, PFO, fatty liver.  He has had a CVA presenting with left-sided weakness and requiring an embolectomy with resultant full neurologic recovery. In reviewing his outside medical records and from a neurology input from Dr. Narda Amber the CVA appeared to be likely cardioembolic from his mechanical aortic valve in the setting of subtherapeutic Coumadin levels. Chance of an isolated large clot from his PFO was thought to be less likely.  Patient is following up with his pain management physician in [facility] for persistent thoracic radiculopathy for consideration of possible nerve ablation procedure. His primary care physician would like [place] to in regarding anticoagulation recommendations.  I had a detailed discussion with the patient and his wife regarding the inherent risk of taking him off anticoagulation with regards to recurrent thromboembolic events especially since he had a significant potentially debilitating CVA only a few months back in the setting of borderline subtherapeutic INR is on Coumadin.  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 This is an additional element of risk of anticoagulation.  Since cardiology/Dr. Radford Pax was debating the need for PFO closure we discussed and she recommended doing a hypercoagulable workup since the presence of a thrombophilia would be a relative contraindication for PFO closure device. This was done and was unrevealing.  MEDICAL HISTORY:  Past Medical History:  Diagnosis Date  . Arthritis   . Atypical moles    melanomna  . Basal cell carcinoma   . Bilateral carotid artery stenosis 08/03/2014   1-39% right and 40-59% left carotid artery stenosis  . Chronic anticoagulation    for mechanical AVR  . Cluster headaches   . CVA (cerebral vascular accident) ([place]) 07/24/2014    MRI indicating 2 punctate foci of acute infarction in the l  . Diverticulosis   . Dizziness   . Episodic recurrent vertigo 2002 & 2005   carotid dopplers w no clinically significant stenosis  . Fatty liver    hx of elevated hepatic transaminases-negative workup in 2009 except for U/S suggesting fatty liver  . GERD (gastroesophageal reflux disease)   . Gout   . Hyperlipidemia   . Hyperlipidemia LDL goal <70 12/05/2015  . Joint pain   . LBBB (left bundle branch block)   . Melanoma ([place])    hx of melanoma and multiple basal cell carcinomas Dr Tonia Brooms  . PFO (patent foramen ovale) 12/05/2015  . Positive ANA (antinuclear antibody)   . S/P AVR (aortic valve replacement)    mechanical  . TIA (transient ischemic attack)     SURGICAL HISTORY: Past Surgical History:  Procedure Laterality Date  . APPENDECTOMY    . CARDIAC VALVE REPLACEMENT    .  CHOLECYSTECTOMY    . DENTAL SURGERY Left bone graft and extraction  . MELANOMA EXCISION     x3  . TEE WITHOUT CARDIOVERSION N/A 07/31/2014   Procedure: TRANSESOPHAGEAL ECHOCARDIOGRAM (TEE);  Surgeon: Sueanne Margarita, MD;  Location: [facility] ENDOSCOPY;   Service: Cardiovascular;  Laterality: N/A;  . TEE WITHOUT CARDIOVERSION N/A 10/01/2014   Procedure: TRANSESOPHAGEAL ECHOCARDIOGRAM (TEE);  Surgeon: Lelon Perla, MD;  Location: [facility] ENDOSCOPY;  Service: Cardiovascular;  Laterality: N/A;    SOCIAL HISTORY: Social History   Social History  . Marital status: Married    Spouse name: N/A  . Number of children: N/A  . Years of education: N/A   Occupational History  . Not on file.   Social History Main Topics  . Smoking status: Never Smoker  . Smokeless tobacco: Never Used  . Alcohol use 0.0 oz/week     Comment: moderate - 2-3 drinks about 3 times per week of wine, liquor, beer  . Drug use: No  . Sexual activity: Not on file   Other Topics Concern  . Not on file   Social History Narrative   Lives with wife in a one story home.  No children.     Retired from [employer].    FAMILY HISTORY: Family History  Problem Relation Age of Onset  . Cancer Mother     melanoma, ovarian  . Cancer Father     prostate  . Melanoma    . Psoriasis    . Valvular heart disease Brother   . Scoliosis Sister     ALLERGIES:  has No Known Allergies.  MEDICATIONS:  Current Outpatient Prescriptions  Medication Sig Dispense Refill  . amoxicillin (AMOXIL) 500 MG capsule Take 2,000 mg by mouth See admin instructions. One hour prior to all dental appointments as a pre-treatment    . aspirin 81 MG tablet Take 81 mg by mouth daily.    . colchicine 0.6 MG tablet Take 0.6 mg by mouth See admin instructions. Once a day as needed for gout flares    . diazepam (VALIUM) 2 MG tablet Take 2 mg by mouth at bedtime as needed (for sleep).     . Enoxaparin Sodium (LOVENOX [place]) Inject into the skin.    . fenofibrate (TRICOR) 145 MG tablet Take 1 tablet (145 mg total) by mouth daily. 90 tablet 3  . gabapentin (NEURONTIN) 300 MG capsule Take 900 mg by mouth 3 (three) times daily.    [name] HYDROcodone-acetaminophen (NORCO) 10-325 MG tablet Take 1 tablet by mouth 3  (three) times daily as needed (for back pain).     [name] lisinopril (PRINIVIL,ZESTRIL) 5 MG tablet Take 1 tablet (5 mg total) by mouth daily. 90 tablet 2  . methocarbamol (ROBAXIN) 500 MG tablet Take 500 mg by mouth daily as needed for muscle spasms.     . Multiple Vitamins-Minerals (ONE-A-DAY MENS 50+ ADVANTAGE) TABS Take 1 tablet by mouth daily.    . pantoprazole (PROTONIX) 40 MG tablet Take 40 mg by mouth daily.    . pravastatin (PRAVACHOL) 40 MG tablet Take 1 tablet (40 mg total) by mouth daily. 90 tablet 3  . ranitidine (ZANTAC) 75 MG tablet Take 75 mg by mouth 2 (two) times daily as needed for heartburn.     . warfarin (COUMADIN) 2.5 MG tablet Take 1.25-2.5 mg by mouth See admin instructions. 1.25 mg on Sunday evenings at 1830 (6:30 PM) and 2.5 mg on Mon/Tues/Wed/Thurs/Fri/Sat     No current facility-administered medications for this  visit.     REVIEW OF SYSTEMS:    10 Point review of Systems was done is negative except as noted above.  PHYSICAL EXAMINATION: ECOG PERFORMANCE STATUS: 1 - Symptomatic but completely ambulatory  . Vitals:   10/08/16 1505  BP: (!) 90/57  Pulse: 70  Resp: 18  Temp: 97.8 F (36.6 C)   Filed Weights   10/08/16 1505  Weight: 178 lb 5 oz (80.9 kg)   .Body mass index is 25.59 kg/m.  GENERAL:alert, in no acute distress and comfortable SKIN: no acute rashes, no significant lesions EYES: conjunctiva are pink and non-injected, sclera anicteric OROPHARYNX: MMM, no exudates, no oropharyngeal erythema or ulceration NECK: supple, no JVD LYMPH:  no palpable lymphadenopathy in the cervical, axillary or inguinal regions LUNGS: clear to auscultation b/l with normal respiratory effort HEART: regular rate & rhythm ABDOMEN:  normoactive bowel sounds , non tender, not distended. Extremity: no pedal edema PSYCH: alert & oriented x 3 with fluent speech NEURO: no focal motor/sensory deficits  LABORATORY DATA:  I have reviewed the data as listed  . CBC Latest  Ref Rng & Units 08/04/2016 08/04/2016 12/19/2014  WBC 4.0 - 10.5 K/uL - 5.4 6.5  Hemoglobin 13.0 - 17.0 g/dL 11.2(L) 10.5(L) 11.9(L)  Hematocrit 39.0 - 52.0 % 33.0(L) 33.0(L) 35.8(L)  Platelets 150 - 400 K/uL - 339 277    . CMP Latest Ref Rng & Units 08/04/2016 08/04/2016 02/26/2016  Glucose 65 - 99 mg/dL 120(H) 120(H) -  BUN 6 - 20 mg/dL 21(H) 18 -  Creatinine 0.61 - 1.24 mg/dL 1.10 1.11 -  Sodium 135 - 145 mmol/L 141 139 -  Potassium 3.5 - 5.1 mmol/L 3.6 3.7 -  Chloride 101 - 111 mmol/L 106 107 -  CO2 22 - 32 mmol/L - 22 -  Calcium 8.9 - 10.3 mg/dL - 9.6 -  Total Protein 6.5 - 8.1 g/dL - 6.6 -  Total Bilirubin 0.3 - 1.2 mg/dL - 0.4 -  Alkaline Phos 38 - 126 U/L - 27(L) -  AST 15 - 41 U/L - 29 -  ALT 17 - 63 U/L - 15(L) 16   Factor 5 leiden  Order: [id number]  Status:  Final result Visible to patient:  No (Not Released) Next appt:  08/16/2017 at 11:30 AM in Neurology (PATEL, DONIKA, DO) Dx:  Hypercoagulable state ([place])   7d ago  Factor V Leiden Comment   Comments: Result: Negative (no mutation found)        Prothrombin gene mutation  Order: [id number]  Status:  Final result Visible to patient:  No (Not Released) Next appt:  08/16/2017 at 11:30 AM in Neurology (PATEL, DONIKA, DO) Dx:  Hypercoagulable state ([place])   7d ago  Factor II, DNA Analysis Comment   Comments: NEGATIVE  No mutation identified.        Component     Latest Ref Rng & Units 10/16/2016  PTT-LA     0.0 - 51.9 sec 35.3  DRVVT     0.0 - 47.0 sec 46.4  Lupus Anticoag Interp      Comment:  Anticardiolipin Ab,IgG,Qn     0 - 14 GPL U/mL 9  Anticardiolipin Ab,IgM,Qn     0 - 12 MPL U/mL <9  Anticardiolipin Ab,IgA,Qn     0 - 11 APL U/mL 14 (H)  Beta-2 Glycoprotein I Ab, IgG     0 - 20 GPI IgG units <9  Beta-2 Glyco 1 IgA     0 - 25 GPI IgA  units <9  Beta-2 Glyco 1 IgM     0 - 32 GPI IgM units <9  Antithrombin Activity     75 - 135 % 109    RADIOGRAPHIC STUDIES: I have personally reviewed the  radiological images as listed and agreed with the findings in the report.  [place] ext veins 10/13/2016: Evidence of chronic, non occlusive, deep venous thrombus in the right mid femoral vein. Evidence of non occlusive, deep venous thrombus, of uncertain age in the right above-the-knee popliteal vein.  No evidence of left lower extremity deep or superficial venous thrombus. No evidence of lower extermity venous incompetence, bilaterally.    ASSESSMENT & PLAN:   66 year old man with multiple medical comorbidities as noted above including recent embolic large residual CVA thought to be from his mechanical aortic valve in the setting of borderline subtherapeutic INR levels. Patient also has a PFO with ultrasound evidence of chronic DVT. He had a hypercoagulable workup as noted above which is unrevealing. He continues to be on Coumadin with a recommendation of being at an INR level of 3-3.5.  He is here for recommendations regarding anticoagulation for a possible thoracic nerve ablation procedure for pain control.  Plan -I clearly and unequivocally discussed with the patient and his wife that taking him off anticoagulation would be very high risk for recurrent thromboembolic episodes related to his mechanical aortic valve and possibly his PFO and that I would not recommended unless absolutely needed for a life saving surgical procedure. -I strongly recommended the patient explore all potential alternative pain management options including acupuncture massage therapy chiropractic manipulation, oral pain medications, physical therapy, TENS etc.  -In discussing with his cardiologist Dr. Radford Pax and with Dr. Doristine Counter Patel/neurology his recent CVA from February 2018 was thought to be related to his mechanical aortic valve as opposed to his PFO. -PFO closure is not being considered by cardiology at this time.  -If despite understanding the high risk proposition of interrupting anticoagulation for the nerve  ablation procedure he feels strongly about pursuing it would recommend that he get clearance from his cardiologist and urologist for this as well. -In that setting if we absolutely must we would typically recommend to do this in an inpatient and not an outpatient setting. Would transition him from Coumadin to Lovenox about a week before surgery and that admit him and transitioned to IV heparin about 48 hours prior to surgery followed by minimal time off IV heparin (4-6h considering spinal procedure) for the procedure and restarting the IV heparin without bolus as soon as thought to be safe by the surgeon and then transitioning back to therapeutic lovenox and then coumadin. Doing a simple Lovenox only  pre-and post surgery bridge would increase the risk with increased time off anticoagulation but would be something that could be done as outpatient. -Also in the unexpected event of a spinal bleed the patient might need to be off anticoagulation for longer increasing his risk for both postprocedure bleeding due to anticoagulation and increased risk of thromboembolism off anticoagulation.  All of the patients questions were answered with apparent satisfaction. The patient knows to call the clinic with any problems, questions or concerns.  I spent 60 minutes counseling the patient face to face. The total time spent in the appointment was 80 minutes and more than 50% was on counseling and direct patient cares.    Tracy Lone MD [place] AAHIVMS [facility] Hematology/Oncology Physician [employer]  (Office):       [phone] (  Work cell):  [phone] (Fax):           [phone]  10/08/2016 3:05 PM

## 2016-10-08 NOTE — Patient Instructions (Signed)
Thank you for choosing [facility] to provide your oncology and hematology care.  To afford each patient quality time with our providers, please arrive 30 minutes before your scheduled appointment time.  If you arrive late for your appointment, you may be asked to reschedule.  We strive to give you quality time with our providers, and arriving late affects you and other patients whose appointments are after yours.  If you are a no show for multiple scheduled visits, you may be dismissed from the clinic at the providers discretion.   Again, thank you for choosing [facility], our hope is that these requests will decrease the amount of time that you wait before being seen by our physicians.  ______________________________________________________________________ Should you have questions after your visit to the [facility], please contact our office at [phone] between the hours of 8:30 and 4:30 p.m.    Voicemails left after 4:30p.m will not be returned until the following business day.   For prescription refill requests, please have your pharmacy contact us directly.  Please also try to allow 48 hours for prescription requests.   Please contact the scheduling department for questions regarding scheduling.  For scheduling of procedures such as PET scans, CT scans, MRI, Ultrasound, etc please contact central scheduling at [phone].   Resources For Cancer Patients and Caregivers:  American Cancer Society:  800-227-2345  Can help patients locate various types of support and financial assistance Cancer Care: 1-800-813-HOPE (4673) Provides financial assistance, online support groups, medication/co-pay assistance.   [place] DSS:  336-641-3447 Where to apply for food stamps, Medicaid, and utility assistance Medicare Rights Center: 800-333-4114 Helps people with Medicare understand their rights and benefits, navigate the Medicare system, and secure the  quality healthcare they deserve SCAT: 336-333-6589 [place] [employer]'s shared-ride transportation service for eligible riders who have a disability that prevents them from riding the fixed route bus.   For additional information on assistance programs please contact our social worker:   Grier Hock/Abigail Elmore:  [phone] 

## 2016-10-13 ENCOUNTER — Encounter: Payer: Self-pay | Admitting: Cardiology

## 2016-10-13 ENCOUNTER — Ambulatory Visit (HOSPITAL_COMMUNITY)
Admission: RE | Admit: 2016-10-13 | Discharge: 2016-10-13 | Disposition: A | Discharge status: Home / Self Care | Payer: 59 | Source: Ambulatory Visit | Attending: Cardiovascular Disease | Admitting: Cardiovascular Disease

## 2016-10-13 ENCOUNTER — Ambulatory Visit (INDEPENDENT_AMBULATORY_CARE_PROVIDER_SITE_OTHER): Payer: 59 | Admitting: Cardiology

## 2016-10-13 VITALS — BP 112/76 | HR 60 | Ht 70.0 in | Wt 175.4 lb

## 2016-10-13 DIAGNOSIS — I359 Nonrheumatic aortic valve disorder, unspecified: Secondary | ICD-10-CM | POA: Diagnosis not present

## 2016-10-13 DIAGNOSIS — I82431 Acute embolism and thrombosis of right popliteal vein: Secondary | ICD-10-CM | POA: Insufficient documentation

## 2016-10-13 DIAGNOSIS — E785 Hyperlipidemia, unspecified: Secondary | ICD-10-CM

## 2016-10-13 DIAGNOSIS — I6523 Occlusion and stenosis of bilateral carotid arteries: Secondary | ICD-10-CM | POA: Diagnosis not present

## 2016-10-13 DIAGNOSIS — I82411 Acute embolism and thrombosis of right femoral vein: Secondary | ICD-10-CM | POA: Diagnosis not present

## 2016-10-13 DIAGNOSIS — Z8673 Personal history of transient ischemic attack (TIA), and cerebral infarction without residual deficits: Secondary | ICD-10-CM | POA: Diagnosis not present

## 2016-10-13 DIAGNOSIS — I712 Thoracic aortic aneurysm, without rupture, unspecified: Secondary | ICD-10-CM

## 2016-10-13 DIAGNOSIS — Q211 Atrial septal defect: Secondary | ICD-10-CM | POA: Diagnosis not present

## 2016-10-13 DIAGNOSIS — Q2112 Patent foramen ovale: Secondary | ICD-10-CM

## 2016-10-13 DIAGNOSIS — Z952 Presence of prosthetic heart valve: Secondary | ICD-10-CM | POA: Diagnosis not present

## 2016-10-13 MED ORDER — LISINOPRIL 2.5 MG PO TABS: 2.5000 mg | ORAL_TABLET | Freq: Every day | ORAL | 3 refills | Status: DC

## 2016-10-13 NOTE — Patient Instructions (Signed)
Medication Instructions:  1) DECREASE LISINOPRIL to 2.5 mg daily  Labwork: None  Testing/Procedures: Dr. Radford Pax recommends you have LOWER EXTREMITY VENOUS DOPPLERS.  Follow-Up: Your physician wants you to follow-up in: 6 months with Dr. Radford Pax. You will receive a reminder letter in the mail two months in advance. If you don't receive a letter, please call our office to schedule the follow-up appointment.   Any Other Special Instructions Will Be Listed Below (If Applicable).     If you need a refill on your cardiac medications before your next appointment, please call your pharmacy.

## 2016-10-13 NOTE — Progress Notes (Signed)
Cardiology Office Note    Date:  10/13/2016   ID:  Tracy Marshall., DOB 1950/11/09, MRN [id number]  PCP:  Tracy Melter, MD  Cardiologist:  Tracy Him, MD   Chief Complaint  Patient presents with  . Follow-up    AVR, carotid artery stenosis    History of Present Illness:  Tracy Marshall. is a 66 y.o. male with a history of mechanical AVR remotely in 2001 (reason unknown to patient), mild LV dysfunction EF 45-50% and a MUGA was done to verify EF which was 45% and echo from 2012 was 52%.  He also has an ascending thoracic aneurysm that is being followed by Dr. Roxan Marshall. He is here for followup today and is doing well.  Since I saw Marshall last he has had a CVA s/p emoblectomy with full recovery.  He says that his MD thinks that it was due to low INR from taking a flouroquinolone for an skin infection.  He has noticed that he has been having DOE which is new for Marshall.  He has had chronic back pain and has been fairly sedentary.  He has seen pain management and they want to do a never ablation.  He says that his hematologist felt it was too risky to come off his anticoagulation.  He denies any chest pain or pressure, PND, orthopnea, LE edema,  palpitations or syncope. He has had some problems with his BP running low (90/69mmHg) and has gotten dizzy some when going from sitting to standing too fast.   He is compliant with his meds and tolerates them well.    Past Medical History:  Diagnosis Date  . Arthritis   . Atypical moles    melanomna  . Basal cell carcinoma   . Bilateral carotid artery stenosis 08/03/2014   1-39% right and 40-59% left carotid artery stenosis  . Chronic anticoagulation    for mechanical AVR  . Cluster headaches   . CVA (cerebral vascular accident) ([place]) 07/24/2014    MRI indicating 2 punctate foci of acute infarction.  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   . Diverticulosis   . Dizziness     . Episodic recurrent vertigo 2002 & 2005   carotid dopplers w no clinically significant stenosis  . Fatty liver    hx of elevated hepatic transaminases-negative workup in 2009 except for U/S suggesting fatty liver  . GERD (gastroesophageal reflux disease)   . Gout   . Hyperlipidemia   . Hyperlipidemia LDL goal <70 12/05/2015  . Joint pain   . LBBB (left bundle branch block)   . Melanoma ([place])    hx of melanoma and multiple basal cell carcinomas Dr Tonia Brooms  . PFO (patent foramen ovale) 12/05/2015  . Positive ANA (antinuclear antibody)   . S/P AVR (aortic valve replacement)    mechanical  . TIA (transient ischemic attack)     Past Surgical History:  Procedure Laterality Date  . APPENDECTOMY    . CARDIAC VALVE REPLACEMENT    . CHOLECYSTECTOMY    . DENTAL SURGERY Left bone graft and extraction  . MELANOMA EXCISION     x3  . TEE WITHOUT CARDIOVERSION N/A 07/31/2014   Procedure: TRANSESOPHAGEAL ECHOCARDIOGRAM (TEE);  Surgeon: Tracy Margarita, MD;  Location: [facility] ENDOSCOPY;  Service: Cardiovascular;  Laterality: N/A;  . TEE WITHOUT CARDIOVERSION N/A 10/01/2014   Procedure: TRANSESOPHAGEAL ECHOCARDIOGRAM (TEE);  Surgeon: Tracy Perla, MD;  Location: [facility] ENDOSCOPY;  Service:  Cardiovascular;  Laterality: N/A;    Current Medications: Current Meds  Medication Sig  . amoxicillin (AMOXIL) 500 MG capsule Take 2,000 mg by mouth See admin instructions. One hour prior to all dental appointments as a pre-treatment  . aspirin 81 MG tablet Take 81 mg by mouth daily.  . colchicine 0.6 MG tablet Take 0.6 mg by mouth See admin instructions. Once a day as needed for gout flares  . diazepam (VALIUM) 2 MG tablet Take 2 mg by mouth at bedtime as needed (for sleep).   . Enoxaparin Sodium (LOVENOX [place]) Inject into the skin.  . fenofibrate (TRICOR) 145 MG tablet Take 1 tablet (145 mg total) by mouth daily.  [name] gabapentin (NEURONTIN) 300 MG capsule Take 900 mg by mouth 3 (three) times daily.  [name]  HYDROcodone-acetaminophen (NORCO) 10-325 MG tablet Take 1 tablet by mouth 3 (three) times daily as needed (for back pain).   [name] lisinopril (PRINIVIL,ZESTRIL) 5 MG tablet Take 1 tablet (5 mg total) by mouth daily.  . methocarbamol (ROBAXIN) 500 MG tablet Take 500 mg by mouth daily as needed for muscle spasms.   . Multiple Vitamins-Minerals (ONE-A-DAY MENS 50+ ADVANTAGE) TABS Take 1 tablet by mouth daily.  . nortriptyline (PAMELOR) 25 MG capsule Take 25 mg by mouth at bedtime.  . pantoprazole (PROTONIX) 40 MG tablet Take 40 mg by mouth daily.  . pravastatin (PRAVACHOL) 40 MG tablet Take 1 tablet (40 mg total) by mouth daily.  . ranitidine (ZANTAC) 75 MG tablet Take 75 mg by mouth 2 (two) times daily as needed for heartburn.   . warfarin (COUMADIN) 2.5 MG tablet Take 1.25-2.5 mg by mouth See admin instructions. 1.25 mg on Sunday evenings at 1830 (6:30 PM) and 2.5 mg on Mon/Tues/Wed/Thurs/Fri/Sat    Allergies:   Patient has no known allergies.   Social History   Social History  . Marital status: Married    Spouse name: N/A  . Number of children: N/A  . Years of education: N/A   Social History Main Topics  . Smoking status: Never Smoker  . Smokeless tobacco: Never Used  . Alcohol use 0.0 oz/week     Comment: moderate - 2-3 drinks about 3 times per week of wine, liquor, beer  . Drug use: No  . Sexual activity: Not Asked   Other Topics Concern  . None   Social History Narrative   Lives with wife in a one story home.  No children.     Retired from [employer].     Family History:  The patient's family history includes Cancer in his father and mother; Scoliosis in his sister; Valvular heart disease in his brother.   ROS:   Please see the history of present illness.    ROS All other systems reviewed and are negative.  No flowsheet data found.     PHYSICAL EXAM:   VS:  BP 112/76   Pulse 60   Ht 5\' 10"  (1.778 m)   Wt 175 lb 6.4 oz (79.6 kg)   SpO2 97%   BMI 25.17 kg/m     GEN: Well nourished, well developed, in no acute distress  HEENT: normal  Neck: no JVD, carotid bruits, or masses Cardiac: RRR; no murmurs, rubs, or gallops,no edema.  Intact distal pulses bilaterally.  Respiratory:  clear to auscultation bilaterally, normal work of breathing GI: soft, nontender, nondistended, + BS MS: no deformity or atrophy  Skin: warm and dry, no rash Neuro:  Alert and Oriented x 3, Strength  and sensation are intact Psych: euthymic mood, full affect  Wt Readings from Last 3 Encounters:  10/13/16 175 lb 6.4 oz (79.6 kg)  10/08/16 178 lb 5 oz (80.9 kg)  08/22/16 165 lb (74.8 kg)      Studies/Labs Reviewed:   EKG:  EKG is not ordered today.    Recent Labs: 08/04/2016: ALT 15; BUN 21; Creatinine, Ser 1.10; Hemoglobin 11.2; Platelets 339; Potassium 3.6; Sodium 141   Lipid Panel    Component Value Date/Time   CHOL 149 02/26/2016 1058   TRIG 115 02/26/2016 1058   HDL 67 02/26/2016 1058   CHOLHDL 2.2 02/26/2016 1058   VLDL 23 02/26/2016 1058   LDLCALC 59 02/26/2016 1058    Additional studies/ records that were reviewed today include:  none    ASSESSMENT:    1. Aortic valve disorder   2. Bilateral carotid artery stenosis   3. Thoracic aortic aneurysm without rupture ([place])   4. PFO (patent foramen ovale)   5. Hyperlipidemia LDL goal <70      PLAN:  In order of problems listed above:  1. AV disease s/p mechanical AVR.  He will continue on ASA and warfarin.  2D echo 07/2016 showed normal valve function.  He had a CVA with INR subtherapeutic at 2.2 due to a fluroquinolone he took for his skin infection.  I have instructed Marshall to try to run the INR around 3-3.5.   2. Bilateral carotid artery stenosis (1-39% by dopplers 2017) - repeat dopplers in 07/2017.  Continue statin and fenofibrate.  3. Thoracic aortic aneurysm without rupture which is followed by Dr. Roxan Marshall.  He has been on Lisinopril but his BP has been running low.  I will decrease the  Lisinopril to 2.5mg  daily and if he continues to have dizziness and low BP he is to call me.   4. PFO - this was noted on 2D echo as well 07/2016 at [facility] in setting of CVA.  His CVA was likely secondary to low INR but I will get LE venous dopplers to rule out DVT given his PFO.  Unlikely to have a DVT as he has been on warfarin but he is sedentary and his INR was subtherapeutic.  5. Hyperlipidemia - LDL goal < 70.  LDL was 58 on 08/05/2016. 6. DCM with EF 45-50% by MUGA.  He will continue on ACE I.  Creatinine stable 07/2016. 2D echo 07/2016 with normal LVF EF 55-60% with mild LVH.    Medication Adjustments/Labs and Tests Ordered: Current medicines are reviewed at length with the patient today.  Concerns regarding medicines are outlined above.  Medication changes, Labs and Tests ordered today are listed in the Patient Instructions below.  There are no Patient Instructions on file for this visit.   Signed, Tracy Him, MD  10/13/2016 10:58 AM    [address], [place], [place]  [address] Phone: [phone]; Fax: [phone]

## 2016-10-13 NOTE — Progress Notes (Addendum)
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  He has a history of mechanical aortic valve replacement and is on chronic warfarin anticoagulation, recent INR fully within therapeutic range (per patient report 3.3). He had a stroke in February 2018 when the INR was lower at 2.2. His echo in February 2018 showed suspicion for PFO and saline contrast did confirm right to left shunt. An older TEE performed in 2016 confirm the presence of a PFO and an associated atrial septal aneurysm  Continue anticoagulation. Discussed consideration for PFO closure with Dr. Radford Pax. She will review this with Dr. Gerrit Halls.  Consider hypercoagulable workup since he apparently has developed DVT despite full anticoagulation.  Sanda Klein, MD, [facility] [phone] office [phone] pager

## 2016-10-14 NOTE — Progress Notes (Signed)
Patient has non occlusive thrombus in LE despite therapeutic INR.  Please call patient and find out who his hematologist is and if he has a neurologist

## 2016-10-15 ENCOUNTER — Other Ambulatory Visit: Payer: Self-pay | Admitting: Hematology

## 2016-10-15 ENCOUNTER — Telehealth: Payer: Self-pay | Admitting: Cardiology

## 2016-10-15 ENCOUNTER — Telehealth: Payer: Self-pay | Admitting: Hematology

## 2016-10-15 DIAGNOSIS — D6859 Other primary thrombophilia: Secondary | ICD-10-CM

## 2016-10-15 NOTE — Telephone Encounter (Signed)
Scheduled lab appt per sch message from [facility] - patient already aware.

## 2016-10-15 NOTE — Telephone Encounter (Signed)
Informed patient of results and verbal understanding expressed.  Patient understands his INR should run around 3. Dr. Olen Pel' office notified.  He understands Dr. Grier Mitts office will call with further instructions. He understands he will also be called once Dr. Posey Pronto gives her recommendations. He agrees with treatment plan and was grateful for call.  Confirmed with Tracy Marshall at Dr. Olen Pel' office that Coumadin INR goal should be around 3. Faxed to Dr. Olen Pel at [phone].

## 2016-10-15 NOTE — Telephone Encounter (Signed)
New Message     What is the next step after the DVT test?

## 2016-10-15 NOTE — Telephone Encounter (Signed)
-----   Message from Tracy Margarita, MD sent at 10/14/2016  4:48 PM EDT ----- This is a very difficult case.  Unclear etiology of recent CVA.  He initially presented to [facility] with CVA and INR was near therapeutic for a mechanical AVR at 2.39.  He was transferred to [facility] for embolectomy as the lab at [facility] was not available.  He had an echo there that showed normal LVF with normally functioning AVR and reconfirmed PFO by bubble study.  No LE venous dopplers were done that admission and it was felt that his CVA resulted from a mildly reduced INR due to recent fluoroquinolone use for a skin infection.  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 Age in the right above the knee popliteal vein.  He is asymptomatic with no LE edema, swelling or pain.  I have spoken with Dr. Burt Knack in regards to whether this patient should have a PFO closure.  He is concerned that he could have an underlying hypercoagulable state making him more prone to thrombus on adequate anticoagulation which would be a contraindication to PFO closure device.  I have spoken with Dr. Irene Limbo, who felt it was unlikely that the DVT if new and is probably chronic given no symptoms but would proceed with hypercoag workup.  He will contact the patient and let me know the results.  In the interim I would like his INR to run around 3 so please inform the patient and his PCP of this who manages his coumadin.    Dr. Posey Pronto, can you given any input into whether you think there is an equal likelihood that this patient's CVAs could be coming from his mechanical valve vs. DVT with PFO and whether Gerrit Halls should evaluate the patient for PFO closure.  Any advise would be greatly appreciated.    Fransico Him

## 2016-10-16 ENCOUNTER — Other Ambulatory Visit (HOSPITAL_BASED_OUTPATIENT_CLINIC_OR_DEPARTMENT_OTHER): Payer: 59

## 2016-10-16 DIAGNOSIS — D6859 Other primary thrombophilia: Secondary | ICD-10-CM

## 2016-10-19 LAB — LUPUS ANTICOAGULANT PANEL
DRVVT: 46.4 s (ref 0.0–47.0)
PTT-LA: 35.3 s (ref 0.0–51.9)

## 2016-10-19 LAB — BETA-2-GLYCOPROTEIN I ABS, IGG/M/A

## 2016-10-19 LAB — CARDIOLIPIN ANTIBODIES, IGG, IGM, IGA
ANTICARDIOLIPIN IGA: 14 U/mL — AB (ref 0–11)
Anticardiolipin Ab,IgG,Qn: 9 GPL U/mL (ref 0–14)
Anticardiolipin Ab,IgM,Qn: <9 MPL U/mL (ref 0–12)

## 2016-10-19 LAB — ANTITHROMBIN III: Antithrombin Activity: 109 % (ref 75–135)

## 2016-10-20 LAB — PROTHROMBIN GENE MUTATION

## 2016-10-20 LAB — FACTOR 5 LEIDEN

## 2016-11-02 ENCOUNTER — Telehealth: Payer: Self-pay | Admitting: Cardiology

## 2016-11-02 NOTE — Telephone Encounter (Signed)
Patient reports low blood pressure for the last couple days. At last OV with Dr. Radford Pax, his Lisinopril was decreased to 2.5 mg daily. Yesterday, his BP was 95/67. Today, his BP was 91/71. He takes his Lisinopril at night time. His BP readings were taking during the day.  He states the only symptom he has is that when he stands he gets a little lightheaded.  Patient states he feels like he is staying hydrated.  Instructed patient to move slowly, especially when changing positions from lying to standing.  He understands he will be called back with Dr. Theodosia Blender instructions.

## 2016-11-02 NOTE — Telephone Encounter (Signed)
New message   Pt c/o medication issue:  1. Name of Medication: lisinopril (PRINIVIL,ZESTRIL) 2.5 MG tablet  2. How are you currently taking this medication (dosage and times per day)? 2.5 MG  3. Are you having a reaction (difficulty breathing--STAT)? NO  4. What is your medication issue? Pt states  Dr. Radford Pax cut his medication in half and his blood pressure is still extremely low. He requests a call back on what he should do or to know if he should double up on the medications

## 2016-11-03 NOTE — Telephone Encounter (Signed)
Stop lisinopril and check BP daily for a week and call with results

## 2016-11-03 NOTE — Telephone Encounter (Signed)
Instructed patient to STOP LISINOPRIL. Patient will check BP daily for a week and call with results. He was grateful for call.

## 2016-11-06 ENCOUNTER — Other Ambulatory Visit: Payer: Self-pay | Admitting: Thoracic Surgery (Cardiothoracic Vascular Surgery)

## 2016-11-06 DIAGNOSIS — I712 Thoracic aortic aneurysm, without rupture, unspecified: Secondary | ICD-10-CM

## 2016-11-09 ENCOUNTER — Telehealth: Payer: Self-pay | Admitting: Cardiology

## 2016-11-09 NOTE — Telephone Encounter (Signed)
New message     Please call pt said his bp average 112/72 over 6day

## 2016-11-09 NOTE — Telephone Encounter (Signed)
Informed patient his BP is normal. Confirmed he is asymptomatic and he understands to call if dizziness returns. He was grateful for assistance.

## 2016-11-17 ENCOUNTER — Telehealth: Payer: Self-pay | Admitting: Cardiology

## 2016-11-17 NOTE — Telephone Encounter (Signed)
Tracy Marshall is calling because his primary is wanting him to have this office follow his coumadin , but he is wanting to speak with you about it . Please call

## 2016-11-17 NOTE — Telephone Encounter (Signed)
Patient is on Coumadin for AVR with therapeutic range 3-3.5 (see LE doppler results for more details). Patient's Coumadin is currently managed by his PCP.  The patient called to report that his PCP is having trouble getting his INR therapeutic and wants to know if [facility] will manage instead. The patient states he checks his Coumadin from home and calls his PCP with results to dose. Patient understands it is not the standard to check INR at home and call to manage. He requests a call tomorrow from [facility] to discuss arranging appointments.

## 2016-11-18 NOTE — Telephone Encounter (Signed)
Returned call to pt, he is willing to come to clinic for INR checks. States his PCP wants cardiology to manage INR since they have had trouble keeping INR in therapeutic range. His INR range is 3-3.5 per Dr Radford Pax given history of AVR, CVA, and PFO, with recent doppler 10/13/16 revealing chronic DVTs, pt to be worked up by hematologist for underlying hypercoagulable state.  Pt states his PCP has given him 4 to 5 different tablet strengths of warfarin. Most recently, he has been taking 2.75mg , apparently taking a 2.5mg  tablet and cutting a 1mg  tablet into quarters.  States he checked his INR yesterday and it was 4.3, his PCP advised him to take 2.5mg  instead of 2.75mg . Pt has used a home monitor because he and his wife travel frequently. He will be out of town this coming Friday-Monday. New Coumadin appt scheduled for Tuesday 6/5. Of note, pt has been on Coumadin since 2001. Will consolidate tablet strengths at upcoming visit for easier dosing regimen.

## 2016-11-24 ENCOUNTER — Ambulatory Visit (INDEPENDENT_AMBULATORY_CARE_PROVIDER_SITE_OTHER): Payer: 59 | Admitting: *Deleted

## 2016-11-24 DIAGNOSIS — Z7901 Long term (current) use of anticoagulants: Secondary | ICD-10-CM

## 2016-11-24 DIAGNOSIS — Q211 Atrial septal defect: Secondary | ICD-10-CM | POA: Diagnosis not present

## 2016-11-24 DIAGNOSIS — Z952 Presence of prosthetic heart valve: Secondary | ICD-10-CM | POA: Diagnosis not present

## 2016-11-24 DIAGNOSIS — Q2112 Patent foramen ovale: Secondary | ICD-10-CM

## 2016-11-24 LAB — POCT INR: INR: 3.9

## 2016-11-24 NOTE — Patient Instructions (Signed)

## 2016-11-25 ENCOUNTER — Telehealth: Payer: Self-pay | Admitting: Cardiology

## 2016-11-25 NOTE — Telephone Encounter (Signed)
Mr.Reifschneider is wanting to speak with you about his appointment for a test . Wants to speak with you about it before cancelling the appt . Please call . Thanks

## 2016-11-25 NOTE — Telephone Encounter (Signed)
Returned a call to the pt in reference to the msg received for [facility].  Explained to the pt the importance of returning next week to have INR checked in the office & appt moved to next Monday from Tuesday.  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 Educated pt about having INR checked weekly since he is a new pt to our practice.  Advised that he has to come next week for an appt  To have INR checked to ensure dose is appropriate & he stated he would see [place] on Monday.

## 2016-11-25 NOTE — Telephone Encounter (Signed)
Patient called to reschedule [facility] Clinic OV because he will be out of town. He requests to check his INR while out of town, call with results, and have the Coumadin Clinic give him results over the phone. Reiterated to patient that for liability reasons he will need to come in the Clinic to be checked for dosing instructions. He requests a call from [facility]madin Clinic to reschedule INR check AND to discuss a plan if he has to postpone the appointment.   He also wanted to inform Dr. Radford Pax that he went to his Audiologist and ENT because he is having pulsatile tinnitus. They instructed him to call Dr. Radford Pax for further testing regarding this symptom.  To Dr. Radford Pax and [facility]in Clinic.

## 2016-11-26 NOTE — Telephone Encounter (Signed)
I do not feel comfortable with PCP following INR - he needs monthly INR check in our office

## 2016-11-27 ENCOUNTER — Telehealth: Payer: Self-pay | Admitting: Cardiology

## 2016-11-27 NOTE — Telephone Encounter (Signed)
New message  Cecille Rubin from [facility] calling because they need to verify that pt does his INR here now. Requests a call back

## 2016-11-27 NOTE — Telephone Encounter (Signed)
Returned a call to [employer] spoke with Jeani Hawking to inform them that the pt is being seen in the office weekly since the pt is new to our office & that we have to monitor the pt weekly & that the pt is aware of this, too, as we have spoken to the pt on 11/25/16.  Pt was advised by this nurse that we will have to check him weekly in the office & he understood this over the phone on 11/25/16

## 2016-12-02 ENCOUNTER — Other Ambulatory Visit: Payer: Self-pay | Admitting: Thoracic Surgery (Cardiothoracic Vascular Surgery)

## 2016-12-04 ENCOUNTER — Ambulatory Visit (INDEPENDENT_AMBULATORY_CARE_PROVIDER_SITE_OTHER): Payer: 59 | Admitting: *Deleted

## 2016-12-04 DIAGNOSIS — Z7901 Long term (current) use of anticoagulants: Secondary | ICD-10-CM

## 2016-12-04 DIAGNOSIS — Q211 Atrial septal defect: Secondary | ICD-10-CM

## 2016-12-04 DIAGNOSIS — Z952 Presence of prosthetic heart valve: Secondary | ICD-10-CM | POA: Diagnosis not present

## 2016-12-04 DIAGNOSIS — Q2112 Patent foramen ovale: Secondary | ICD-10-CM

## 2016-12-04 LAB — POCT INR: INR: 3.2

## 2016-12-09 ENCOUNTER — Encounter: Payer: Self-pay | Admitting: Thoracic Surgery (Cardiothoracic Vascular Surgery)

## 2016-12-09 ENCOUNTER — Ambulatory Visit (INDEPENDENT_AMBULATORY_CARE_PROVIDER_SITE_OTHER): Payer: 59 | Admitting: Thoracic Surgery (Cardiothoracic Vascular Surgery)

## 2016-12-09 ENCOUNTER — Ambulatory Visit
Admission: RE | Admit: 2016-12-09 | Discharge: 2016-12-09 | Disposition: A | Discharge status: Home / Self Care | Payer: 59 | Source: Ambulatory Visit | Attending: Thoracic Surgery (Cardiothoracic Vascular Surgery) | Admitting: Thoracic Surgery (Cardiothoracic Vascular Surgery)

## 2016-12-09 VITALS — BP 110/73 | HR 68 | Resp 16 | Ht 70.0 in | Wt 175.0 lb

## 2016-12-09 DIAGNOSIS — I712 Thoracic aortic aneurysm, without rupture, unspecified: Secondary | ICD-10-CM

## 2016-12-09 DIAGNOSIS — Z952 Presence of prosthetic heart valve: Secondary | ICD-10-CM

## 2016-12-09 MED ORDER — IOPAMIDOL (ISOVUE-370) INJECTION 76%
75.0000 mL | Freq: Once | INTRAVENOUS | Status: AC | PRN
Start: 2016-12-09 — End: 2016-12-09
  Administered 2016-12-09: 75 mL via INTRAVENOUS

## 2016-12-09 NOTE — Progress Notes (Signed)
[address]       [place],Tracy [address]             [phone]     HPI: Tracy Marshall returns today for a scheduled 6 month follow-up visit.  He is a 66 year old gentleman who had a mechanical aortic valve replacement in 2001 in [place]. In 2016 he had a left facial droop and an MR showed small strokes bilaterally. 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 (I felt that was an overestimate of the size at the time).  We have been following him every 6 months since that time. The aneurysm has remained stable. Of note there is no sign of mobile plaque in the ascending aorta or arch on either echocardiogram or CT angiogram.  In February he had a stroke with a left hemiparesis. He had an intervention at [place]. He had a full recovery from the event. He was felt to be due to emboli from the aortic valve as his INR was low at the time. His wife says that his INR is just now getting stable at a therapeutic level.  He is not having any chest pain, pressure, or tightness. Past Medical History:  Diagnosis Date  . Arthritis   . Atypical moles    melanomna  . Basal cell carcinoma   . Bilateral carotid artery stenosis 08/03/2014   1-39% right and 40-59% left carotid artery stenosis  . Chronic anticoagulation    for mechanical AVR  . Cluster headaches   . CVA (cerebral vascular accident) ([place]) 07/24/2014    MRI indicating 2 punctate foci of acute infarction.  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   . Diverticulosis   . Dizziness   . Episodic recurrent vertigo 2002 & 2005   carotid dopplers w no clinically significant stenosis  . Fatty liver    hx of elevated hepatic transaminases-negative workup in 2009 except for U/S suggesting fatty liver  . GERD (gastroesophageal reflux  disease)   . Gout   . Hyperlipidemia   . Hyperlipidemia LDL goal <70 12/05/2015  . Joint pain   . LBBB (left bundle branch block)   . Melanoma ([place])    hx of melanoma and multiple basal cell carcinomas Dr Tonia Brooms  . PFO (patent foramen ovale) 12/05/2015  . Positive ANA (antinuclear antibody)   . S/P AVR (aortic valve replacement)    mechanical  . TIA (transient ischemic attack)      Current Outpatient Prescriptions  Medication Sig Dispense Refill  . amoxicillin (AMOXIL) 500 MG capsule Take 2,000 mg by mouth See admin instructions. One hour prior to all dental appointments as a pre-treatment    . aspirin 81 MG tablet Take 81 mg by mouth daily.    . colchicine 0.6 MG tablet Take 0.6 mg by mouth See admin instructions. Once a day as needed for gout flares    . diazepam (VALIUM) 2 MG tablet Take 2 mg by mouth at bedtime as needed (for sleep).     . fenofibrate (TRICOR) 145 MG tablet Take 1 tablet (145 mg total) by mouth daily. 90 tablet 3  . HYDROcodone-acetaminophen (NORCO) 10-325 MG tablet Take 1 tablet by mouth 3 (three) times daily as needed (for back pain).     . methocarbamol (ROBAXIN) 500 MG tablet Take 500  mg by mouth daily as needed for muscle spasms.     . Multiple Vitamins-Minerals (ONE-A-DAY MENS 50+ ADVANTAGE) TABS Take 1 tablet by mouth daily.    . nortriptyline (PAMELOR) 25 MG capsule Take 25 mg by mouth at bedtime.    . pantoprazole (PROTONIX) 40 MG tablet Take 40 mg by mouth daily.    . pravastatin (PRAVACHOL) 40 MG tablet Take 1 tablet (40 mg total) by mouth daily. 90 tablet 3  . pregabalin (LYRICA) 75 MG capsule Take 75 mg by mouth 2 (two) times daily. Taking 2 tablets each am and 2 tablets each pm    . ranitidine (ZANTAC) 75 MG tablet Take 75 mg by mouth 2 (two) times daily as needed for heartburn.     . warfarin (COUMADIN) 3 MG tablet Take as directed by coumadin clinic     No current facility-administered medications for this visit.     Physical Exam BP 110/73 (BP  Location: Right Arm, Patient Position: Sitting, Cuff Size: Large)   Pulse 68   Resp 16   Ht 5\' 10"  (1.778 m)   Wt 175 lb (79.4 kg)   SpO2 98% Comment: ON RA  BMI 25.96 kg/m  65 year old man in no acute distress Alert and oriented 3 with no focal deficits Good motor strength in all extremities No carotid bruit Cardiac regular rate and rhythm with a systolic murmur and good valve click Lungs clear with breath sounds bilaterally  Diagnostic Tests: CT ANGIOGRAPHY CHEST WITH CONTRAST  TECHNIQUE: Multidetector CT imaging of the chest was performed using the standard protocol during bolus administration of intravenous contrast. Multiplanar CT image reconstructions and MIPs were obtained to evaluate the vascular anatomy.  CONTRAST:  75 mL Isovue 370  COMPARISON:  CTA chest 06/16/2016  FINDINGS: Cardiovascular: Aneurysm of the ascending thoracic aorta is unchanged, measuring 4.2 x 4.1 cm. There is a prosthetic aortic valve. There is variant arch branching pattern with the left vertebral artery arising independently from the aortic arch. Proximal right vertebral artery is diminutive.  Mediastinum/Nodes: No mediastinal, hilar or axillary lymphadenopathy. The visualized thyroid and thoracic esophageal course are unremarkable.  Lungs/Pleura: Lungs are clear. No pleural effusion or pneumothorax.  Upper Abdomen: There are multiple bilateral renal cysts. The left kidney is atrophic with multiple parenchymal defects suggestive of prior infarcts.  Musculoskeletal: No chest wall abnormality. No acute or significant osseous findings.  Review of the MIP images confirms the above findings.  IMPRESSION: 1. Unchanged size of thoracic aortic aneurysm measuring 4.2 x 4.1 cm. 2. Otherwise normal appearance of the chest.   Electronically Signed   By: Ulyses Jarred M.D.   On: 12/09/2016 15:33 I personally reviewed the CT chest and concur with the findings noted  above.  Impression: Tracy Marshall is a 66 year old gentleman who had a mechanical aortic valve replacement 17 years ago. He has a 4.2 cm ascending aneurysm. This is been unchanged over the past couple of years. When it was first found the radiologist remeasuring this it 0.5-4.6 cm, which I thought was an overestimate. Because of that we were following him at a six-month interval. Given that this is been stable and truly measures in the range of 4.1-4.2 cm, I think annual follow-up is adequate.  He did have a stroke but unfortunately had a full recovery. This was felt to be due to emboli from his mechanical valve. His INR now is back to therapeutic range.  Hypertension- blood pressure control is mainstay of medical treatment for the aneurysm.  BP is well controlled on current regimen.  Patent foramen ovale- followed by Dr. Radford Pax  Plan: Return in one year CT angio of chest.  Melrose Nakayama, MD [facility] [phone]

## 2016-12-11 ENCOUNTER — Ambulatory Visit (INDEPENDENT_AMBULATORY_CARE_PROVIDER_SITE_OTHER): Payer: 59

## 2016-12-11 DIAGNOSIS — Q2112 Patent foramen ovale: Secondary | ICD-10-CM

## 2016-12-11 DIAGNOSIS — Z952 Presence of prosthetic heart valve: Secondary | ICD-10-CM | POA: Diagnosis not present

## 2016-12-11 DIAGNOSIS — Z7901 Long term (current) use of anticoagulants: Secondary | ICD-10-CM | POA: Diagnosis not present

## 2016-12-11 DIAGNOSIS — Q211 Atrial septal defect: Secondary | ICD-10-CM | POA: Diagnosis not present

## 2016-12-11 LAB — POCT INR: INR: 3.9

## 2016-12-21 ENCOUNTER — Ambulatory Visit (INDEPENDENT_AMBULATORY_CARE_PROVIDER_SITE_OTHER): Payer: 59 | Admitting: *Deleted

## 2016-12-21 DIAGNOSIS — Q211 Atrial septal defect: Secondary | ICD-10-CM

## 2016-12-21 DIAGNOSIS — Z952 Presence of prosthetic heart valve: Secondary | ICD-10-CM | POA: Diagnosis not present

## 2016-12-21 DIAGNOSIS — Q2112 Patent foramen ovale: Secondary | ICD-10-CM

## 2016-12-21 DIAGNOSIS — Z7901 Long term (current) use of anticoagulants: Secondary | ICD-10-CM | POA: Diagnosis not present

## 2016-12-21 LAB — POCT INR: INR: 2.9

## 2017-01-04 ENCOUNTER — Ambulatory Visit (INDEPENDENT_AMBULATORY_CARE_PROVIDER_SITE_OTHER): Payer: 59 | Admitting: *Deleted

## 2017-01-04 DIAGNOSIS — Z7901 Long term (current) use of anticoagulants: Secondary | ICD-10-CM | POA: Diagnosis not present

## 2017-01-04 DIAGNOSIS — Z952 Presence of prosthetic heart valve: Secondary | ICD-10-CM

## 2017-01-04 DIAGNOSIS — Q211 Atrial septal defect: Secondary | ICD-10-CM | POA: Diagnosis not present

## 2017-01-04 DIAGNOSIS — Q2112 Patent foramen ovale: Secondary | ICD-10-CM

## 2017-01-04 LAB — POCT INR: INR: 2.4

## 2017-01-13 ENCOUNTER — Ambulatory Visit (INDEPENDENT_AMBULATORY_CARE_PROVIDER_SITE_OTHER): Payer: 59 | Admitting: *Deleted

## 2017-01-13 DIAGNOSIS — Z7901 Long term (current) use of anticoagulants: Secondary | ICD-10-CM | POA: Diagnosis not present

## 2017-01-13 DIAGNOSIS — Q211 Atrial septal defect: Secondary | ICD-10-CM | POA: Diagnosis not present

## 2017-01-13 DIAGNOSIS — Z952 Presence of prosthetic heart valve: Secondary | ICD-10-CM | POA: Diagnosis not present

## 2017-01-13 DIAGNOSIS — Q2112 Patent foramen ovale: Secondary | ICD-10-CM

## 2017-01-13 LAB — POCT INR: INR: 2.8

## 2017-01-25 ENCOUNTER — Ambulatory Visit (INDEPENDENT_AMBULATORY_CARE_PROVIDER_SITE_OTHER): Payer: 59

## 2017-01-25 DIAGNOSIS — Z7901 Long term (current) use of anticoagulants: Secondary | ICD-10-CM | POA: Diagnosis not present

## 2017-01-25 DIAGNOSIS — Q2112 Patent foramen ovale: Secondary | ICD-10-CM

## 2017-01-25 DIAGNOSIS — Z952 Presence of prosthetic heart valve: Secondary | ICD-10-CM | POA: Diagnosis not present

## 2017-01-25 DIAGNOSIS — Q211 Atrial septal defect: Secondary | ICD-10-CM | POA: Diagnosis not present

## 2017-01-25 LAB — POCT INR: INR: 3.7

## 2017-02-08 ENCOUNTER — Ambulatory Visit (INDEPENDENT_AMBULATORY_CARE_PROVIDER_SITE_OTHER): Payer: 59 | Admitting: *Deleted

## 2017-02-08 DIAGNOSIS — Q211 Atrial septal defect: Secondary | ICD-10-CM | POA: Diagnosis not present

## 2017-02-08 DIAGNOSIS — Z7901 Long term (current) use of anticoagulants: Secondary | ICD-10-CM | POA: Diagnosis not present

## 2017-02-08 DIAGNOSIS — Z952 Presence of prosthetic heart valve: Secondary | ICD-10-CM | POA: Diagnosis not present

## 2017-02-08 DIAGNOSIS — Q2112 Patent foramen ovale: Secondary | ICD-10-CM

## 2017-02-08 LAB — POCT INR: INR: 2.9

## 2017-02-16 ENCOUNTER — Telehealth: Payer: Self-pay

## 2017-02-16 NOTE — Telephone Encounter (Signed)
Pt requesting to be followed regarding INR pre/post-procedure. Dr. Radford Pax (pt's cardiologist) is currently managing coumadin at her practice's clinic. Pt advised to discuss with Dr. Radford Pax. If she thinks our office should be involved, to call [facility] and speak with Dr. Irene Limbo. Pt verbalized agreement and understanding.

## 2017-02-17 ENCOUNTER — Telehealth: Payer: Self-pay | Admitting: Cardiology

## 2017-02-17 NOTE — Telephone Encounter (Signed)
Patient called to ask if Dr. Radford Pax would let him have a procedure on his back. Instructed patient to have performing MD send over clearance request for procedure and appropriate proceedings would go from there. He was grateful for call and agrees with treatment plan.

## 2017-02-17 NOTE — Telephone Encounter (Signed)
New message    Pt is calling asking for a call back. He said he is going to be scheduled for a procedure that he needs to talk to Dr. Radford Pax about. Please call.

## 2017-02-24 ENCOUNTER — Ambulatory Visit (INDEPENDENT_AMBULATORY_CARE_PROVIDER_SITE_OTHER): Payer: 59 | Admitting: *Deleted

## 2017-02-24 DIAGNOSIS — Z952 Presence of prosthetic heart valve: Secondary | ICD-10-CM

## 2017-02-24 DIAGNOSIS — Z5181 Encounter for therapeutic drug level monitoring: Secondary | ICD-10-CM

## 2017-02-24 DIAGNOSIS — Z7901 Long term (current) use of anticoagulants: Secondary | ICD-10-CM | POA: Diagnosis not present

## 2017-02-24 DIAGNOSIS — Q2112 Patent foramen ovale: Secondary | ICD-10-CM

## 2017-02-24 DIAGNOSIS — Q211 Atrial septal defect: Secondary | ICD-10-CM

## 2017-02-24 LAB — POCT INR: INR: 3.3

## 2017-03-24 ENCOUNTER — Ambulatory Visit (INDEPENDENT_AMBULATORY_CARE_PROVIDER_SITE_OTHER): Payer: Medicare Other | Admitting: Pharmacist

## 2017-03-24 ENCOUNTER — Encounter (INDEPENDENT_AMBULATORY_CARE_PROVIDER_SITE_OTHER): Payer: Self-pay

## 2017-03-24 DIAGNOSIS — Z952 Presence of prosthetic heart valve: Secondary | ICD-10-CM | POA: Diagnosis not present

## 2017-03-24 DIAGNOSIS — Q211 Atrial septal defect: Secondary | ICD-10-CM | POA: Diagnosis not present

## 2017-03-24 DIAGNOSIS — Z7901 Long term (current) use of anticoagulants: Secondary | ICD-10-CM

## 2017-03-24 DIAGNOSIS — Q2112 Patent foramen ovale: Secondary | ICD-10-CM

## 2017-03-24 LAB — POCT INR: INR: 2.7

## 2017-04-08 DIAGNOSIS — M47814 Spondylosis without myelopathy or radiculopathy, thoracic region: Secondary | ICD-10-CM | POA: Diagnosis not present

## 2017-04-14 ENCOUNTER — Ambulatory Visit (INDEPENDENT_AMBULATORY_CARE_PROVIDER_SITE_OTHER): Payer: Medicare Other | Admitting: *Deleted

## 2017-04-14 DIAGNOSIS — Q211 Atrial septal defect: Secondary | ICD-10-CM

## 2017-04-14 DIAGNOSIS — Z952 Presence of prosthetic heart valve: Secondary | ICD-10-CM

## 2017-04-14 DIAGNOSIS — I712 Thoracic aortic aneurysm, without rupture, unspecified: Secondary | ICD-10-CM

## 2017-04-14 DIAGNOSIS — Z5181 Encounter for therapeutic drug level monitoring: Secondary | ICD-10-CM | POA: Diagnosis not present

## 2017-04-14 DIAGNOSIS — Z7901 Long term (current) use of anticoagulants: Secondary | ICD-10-CM

## 2017-04-14 DIAGNOSIS — Q2112 Patent foramen ovale: Secondary | ICD-10-CM

## 2017-04-14 LAB — POCT INR: INR: 3.9

## 2017-04-14 MED ORDER — WARFARIN SODIUM 3 MG PO TABS: ORAL_TABLET | ORAL | 1 refills | Status: DC

## 2017-04-15 ENCOUNTER — Other Ambulatory Visit: Payer: Self-pay | Admitting: *Deleted

## 2017-04-15 DIAGNOSIS — I6523 Occlusion and stenosis of bilateral carotid arteries: Secondary | ICD-10-CM

## 2017-04-28 ENCOUNTER — Ambulatory Visit: Payer: 59 | Admitting: Cardiology

## 2017-05-03 DIAGNOSIS — M47814 Spondylosis without myelopathy or radiculopathy, thoracic region: Secondary | ICD-10-CM | POA: Insufficient documentation

## 2017-05-06 ENCOUNTER — Encounter: Payer: Self-pay | Admitting: Cardiology

## 2017-05-06 ENCOUNTER — Encounter (INDEPENDENT_AMBULATORY_CARE_PROVIDER_SITE_OTHER): Payer: Self-pay

## 2017-05-06 ENCOUNTER — Ambulatory Visit (INDEPENDENT_AMBULATORY_CARE_PROVIDER_SITE_OTHER): Payer: Medicare Other

## 2017-05-06 ENCOUNTER — Telehealth: Payer: Self-pay

## 2017-05-06 ENCOUNTER — Ambulatory Visit (INDEPENDENT_AMBULATORY_CARE_PROVIDER_SITE_OTHER): Payer: Medicare Other | Admitting: Cardiology

## 2017-05-06 VITALS — BP 100/60 | HR 70 | Ht 70.0 in | Wt 185.0 lb

## 2017-05-06 DIAGNOSIS — Z952 Presence of prosthetic heart valve: Secondary | ICD-10-CM

## 2017-05-06 DIAGNOSIS — E785 Hyperlipidemia, unspecified: Secondary | ICD-10-CM | POA: Diagnosis not present

## 2017-05-06 DIAGNOSIS — Q211 Atrial septal defect: Secondary | ICD-10-CM

## 2017-05-06 DIAGNOSIS — I712 Thoracic aortic aneurysm, without rupture, unspecified: Secondary | ICD-10-CM

## 2017-05-06 DIAGNOSIS — Z7901 Long term (current) use of anticoagulants: Secondary | ICD-10-CM

## 2017-05-06 DIAGNOSIS — I359 Nonrheumatic aortic valve disorder, unspecified: Secondary | ICD-10-CM

## 2017-05-06 DIAGNOSIS — I6523 Occlusion and stenosis of bilateral carotid arteries: Secondary | ICD-10-CM | POA: Diagnosis not present

## 2017-05-06 DIAGNOSIS — Q2112 Patent foramen ovale: Secondary | ICD-10-CM

## 2017-05-06 DIAGNOSIS — I639 Cerebral infarction, unspecified: Secondary | ICD-10-CM

## 2017-05-06 LAB — POCT INR: INR: 2.5

## 2017-05-06 NOTE — Patient Instructions (Addendum)
Medication Instructions:  Your physician recommends that you continue on your current medications as directed. Please refer to the Current Medication list given to you today.  Labwork: None ordered   Testing/Procedures: None ordered   Follow-Up: Your physician wants you to follow-up in: 1 year with Dr. Radford Pax. You will receive a reminder letter in the mail two months in advance. If you don't receive a letter, please call our office to schedule the follow-up appointment.  Lauren, RN will contact you to schedule an appointment with Dr. Burt Knack.    Any Other Special Instructions Will Be Listed Below (If Applicable).     If you need a refill on your cardiac medications before your next appointment, please call your pharmacy.

## 2017-05-06 NOTE — Telephone Encounter (Signed)
While at [place] patient stated he will be having an upcoming procedure and needed clarification on if he should stop blood thinners. Per chart, no surgical clearance form has been received at our office.   Confirmed with Aldona Bar, scheduler at [facility] [phone] that patient is scheduled for RFA on 11/26 at 10am with Dr. Catheryn Bacon. Requested a surgical clearance form to be faxed to our office. Per Aldona Bar she will fax form over today.

## 2017-05-06 NOTE — Progress Notes (Signed)
Cardiology Office Note:    Date:  05/06/2017   ID:  Tracy Malta., DOB 1950/07/01, MRN [id number]  PCP:  Orpah Melter, MD  Cardiologist:  Fransico Him, MD   Referring MD: Orpah Melter, MD   Chief Complaint  Patient presents with  . Aortic Stenosis    History of Present Illness:    Tracy Vanterpool. is a 66 y.o. male with a hx of mechanical AVR remotely in 2001 (reason unknown to patient), mild LV dysfunction EF 45-50% and a MUGA was done to verify EF which was 45% and echo from 2012 was 52%. He also has an ascending thoracic aneurysm that is being followed by Dr. Roxan Hockey.  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  We called the patient to find out who his hematologist and neurologist were so I could discuss with them possibility of PFO closure but patient did not call back.    He is now here for followup.  He  is doing well.  He denies any chest pain or pressure, SOB, DOE, PND, orthopnea, LE edema, dizziness, palpitations or syncope. He is compliant with his meds and is tolerating meds with no SE.  He is going to have an ablation done in the near future for back pain.      Past Medical History:  Diagnosis Date  . Arthritis   . Atypical moles    melanomna  . Basal cell carcinoma   . Bilateral carotid artery stenosis 08/03/2014   1-39% right and 40-59% left carotid artery stenosis  . Chronic anticoagulation    for mechanical AVR  . Cluster headaches   . CVA (cerebral vascular accident) ([place]) 07/24/2014    MRI indicating 2 punctate foci of acute infarction.  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   . Diverticulosis   . Dizziness   . Episodic recurrent vertigo 2002 & 2005   carotid dopplers w no clinically significant  stenosis  . Fatty liver    hx of elevated hepatic transaminases-negative workup in 2009 except for U/S suggesting fatty liver  . GERD (gastroesophageal reflux disease)   . Gout   . Hyperlipidemia   . Hyperlipidemia LDL goal <70 12/05/2015  . Joint pain   . LBBB (left bundle branch block)   . Melanoma ([place])    hx of melanoma and multiple basal cell carcinomas Dr Tonia Brooms  . PFO (patent foramen ovale) 12/05/2015  . Positive ANA (antinuclear antibody)   . S/P AVR (aortic valve replacement)    mechanical  . TIA (transient ischemic attack)     Past Surgical History:  Procedure Laterality Date  . APPENDECTOMY    . CARDIAC VALVE REPLACEMENT    . CHOLECYSTECTOMY    . DENTAL SURGERY Left bone graft and extraction  . MELANOMA EXCISION     x3  . TEE WITHOUT CARDIOVERSION N/A 07/31/2014   Procedure: TRANSESOPHAGEAL ECHOCARDIOGRAM (TEE);  Surgeon: Sueanne Margarita, MD;  Location[facility] END[facility]COPY;  Service: Cardiovascular;  Laterality: N/A;  . TEE WITHOUT CARDIOVERSION N/A 10/01/2014   Procedure: TRANSESOPHAGEAL ECHOCARDIOGRAM (TEE);  Surgeon: Lelon Perla, MD;  Location: [facility] ENDOSCOPY;  Service: Cardiovascular;  Laterality: N/A;    Current Medications: Current Meds  Medication Sig  . amoxicillin (AMOXIL) 500 MG capsule Take 2,000 mg by mouth  See admin instructions. One hour prior to all dental appointments as a pre-treatment  . aspirin 81 MG tablet Take 81 mg by mouth daily.  . colchicine 0.6 MG tablet Take 0.6 mg by mouth See admin instructions. Once a day as needed for gout flares  . diazepam (VALIUM) 2 MG tablet Take 2 mg by mouth at bedtime as needed (for sleep).   . fenofibrate (TRICOR) 145 MG tablet Take 1 tablet (145 mg total) by mouth daily.  [name] HYDROcodone-acetaminophen (NORCO) 10-325 MG tablet Take 1 tablet by mouth 3 (three) times daily as needed (for back pain).   . methocarbamol (ROBAXIN) 500 MG tablet Take 500 mg by mouth daily as needed for muscle spasms.   . Multiple  Vitamins-Minerals (ONE-A-DAY MENS 50+ ADVANTAGE) TABS Take 1 tablet by mouth daily.  . naloxegol oxalate (MOVANTIK) 25 MG TABS tablet Take 25 mg daily by mouth.  . nortriptyline (PAMELOR) 25 MG capsule Take 25 mg by mouth at bedtime.  . pantoprazole (PROTONIX) 40 MG tablet Take 40 mg by mouth daily.  . pravastatin (PRAVACHOL) 40 MG tablet Take 1 tablet (40 mg total) by mouth daily.  . pregabalin (LYRICA) 75 MG capsule Take 75 mg by mouth 2 (two) times daily. Taking 2 tablets each am and 2 tablets each pm  . ranitidine (ZANTAC) 75 MG tablet Take 75 mg by mouth 2 (two) times daily as needed for heartburn.   . warfarin (COUMADIN) 3 MG tablet Take as directed by coumadin clinic     Allergies:   Patient has no known allergies.   Social History   Socioeconomic History  . Marital status: Married    Spouse name: None  . Number of children: None  . Years of education: None  . Highest education level: None  Social Needs  . Financial resource strain: None  . Food insecurity - worry: None  . Food insecurity - inability: None  . Transportation needs - medical: None  . Transportation needs - non-medical: None  Occupational History  . None  Tobacco Use  . Smoking status: Never Smoker  . Smokeless tobacco: Never Used  Substance and Sexual Activity  . Alcohol use: Yes    Alcohol/week: 0.0 oz    Comment: moderate - 2-3 drinks about 3 times per week of wine, liquor, beer  . Drug use: No  . Sexual activity: None  Other Topics Concern  . None  Social History Narrative   Lives with wife in a one story home.  No children.     Retired from [employer].     Family History: The patient's family history includes Cancer in his father and mother; Melanoma in his unknown relative; Psoriasis in his unknown relative; Scoliosis in his sister; Valvular heart disease in his brother.  ROS:   Please see the history of present illness.    ROS  All other systems reviewed and negative.    EKGs/Labs/Other Studies Reviewed:    The following studies were reviewed today: none  EKG:  EKG is not ordered today.    Recent Labs: 08/04/2016: ALT 15; BUN 21; Creatinine, Ser 1.10; Hemoglobin 11.2; Platelets 339; Potassium 3.6; Sodium 141   Recent Lipid Panel    Component Value Date/Time   CHOL 149 02/26/2016 1058   TRIG 115 02/26/2016 1058   HDL 67 02/26/2016 1058   CHOLHDL 2.2 02/26/2016 1058   VLDL 23 02/26/2016 1058   LDLCALC 59 02/26/2016 1058    Physical Exam:    VS:  BP 100/60   Pulse 70   Ht 5\' 10"  (1.778 m)   Wt 185 lb (83.9 kg)   SpO2 96%   BMI 26.54 kg/m     Wt Readings from Last 3 Encounters:  05/06/17 185 lb (83.9 kg)  12/09/16 175 lb (79.4 kg)  10/13/16 175 lb 6.4 oz (79.6 kg)     GEN:  Well nourished, well developed in no acute distress HEENT: Normal NECK: No JVD; No carotid bruits LYMPHATICS: No lymphadenopathy CARDIAC: RRR, no murmurs, rubs, gallops RESPIRATORY:  Clear to auscultation without rales, wheezing or rhonchi  ABDOMEN: Soft, non-tender, non-distended MUSCULOSKELETAL:  No edema; No deformity  SKIN: Warm and dry NEUROLOGIC:  Alert and oriented x 3 PSYCHIATRIC:  Normal affect   ASSESSMENT:    1. Aortic valve disorder   2. Bilateral carotid artery stenosis   3. Thoracic aortic aneurysm without rupture ([place])   4. PFO (patent foramen ovale)   5. Hyperlipidemia LDL goal <70    PLAN:    In order of problems listed above:  1.  Severe AS s/p AVR on chronic anticoagulation with coumadin.  INR followed in coumadin clinic. Continue ASA 81mg  daily.  INR goal is 3-4.  He needs to get an ablation in his back and needs guidance on anticoagulation. I have asked him to check with his MD who is performing the procedure to see if he has to be off anticoagulation.  If so he will be high risk and would recommend hospitalization with placement on IV Heparin once INR < 2.5 since he had a CVA with INR 2.39 in the past.    2.  Bilateral carotid  artery stenosis (1-39%) - continue ASA and statin.  3.  Thoracic aortic aneurysm without rupture - followed by Dr. Roxan Hockey  4.  PFO in setting of acute CVA with LE DVT and subtherapeutic INR in setting of flouroquinolone Rx.  This was discussed with Neuro who felt that his CVA was cardioembolic and likely from his AVR.  His INR was 2.39 at the time.  I do not think we can safely say that the CVA was from his AVR when he also has a PFO and DVT.  Therefore recommend referral to Dr. Burt Knack for evaluation for possible PFO closure.    5.  Hyperlipidemia - followed by PCP.  Continue Tricor and pravastatin.     Medication Adjustments/Labs and Tests Ordered: Current medicines are reviewed at length with the patient today.  Concerns regarding medicines are outlined above.  No orders of the defined types were placed in this encounter.  No orders of the defined types were placed in this encounter.   Signed, Fransico Him, MD  05/06/2017 9:12 AM    [place]

## 2017-05-06 NOTE — Patient Instructions (Signed)
Take 1.5 tablets today, then resume same dosage 1 tablet (3 mg) daily except 1/2 tablet (1.5mg ) on Mondays, Wednesdays, and Friday.  Recheck INR in 2 weeks.  Pease call coumadin clinic with any new medications or if scheduled for any procedures [phone]

## 2017-05-08 NOTE — Telephone Encounter (Signed)
Need to find out from MD performing procedure if he needs to be off anticoagulation

## 2017-05-10 ENCOUNTER — Telehealth: Payer: Self-pay | Admitting: Cardiology

## 2017-05-10 NOTE — Telephone Encounter (Signed)
   [facility] Pre-operative Risk Assessment    Request for surgical clearance:  1. What type of surgery is being performed? right T8-10 facet radiofrequency ablation-This would involve heating 20 G radiofrequency needles placed next to the nerves at these locations to ablate the nerves.  2. When is this surgery scheduled? 05/17/17  3. Are there any medications that need to be held prior to surgery and how long? Stop anticoagulation prior to procedure given the proximity to his spine and other vital structures.  4. Practice name and name of physician performing surgery? Dr Kathrene Alu    5. What is your office phone and fax number? [phone] Joylene Igo [phone]   6. Anesthesia type (None, local, MAC, general) not noted   Desiree Lucy 05/10/2017, 2:36 PM  _________________________________________________________________   (provider comments below)

## 2017-05-10 NOTE — Telephone Encounter (Signed)
Pt advised request to hold anticoagulation prior to procedure has been  received from Dr Catheryn Bacon and forwarded to Anticoagulation Pre-Op Pool.

## 2017-05-10 NOTE — Telephone Encounter (Signed)
Form has not been received in office.  I left message on Samantha's voicemail asking her to resend.  Fax number and phone number provided.

## 2017-05-10 NOTE — Telephone Encounter (Signed)
Tracy Marshall is calling want to speak with a nurse about a procedure that he has coming up and nothing has been said about him coming off of his Warfarin . Please call

## 2017-05-10 NOTE — Telephone Encounter (Addendum)
Pt takes Coumadin for mechanical AVR and has hx of recurrent CVA, currently treated to higher INR range 3-3.5. Typically hold warfarin for 5 days prior to spinal procedures. Per recent office visit with Dr Radford Pax on 11/15, "would recommend hospitalization with placement on IV Heparin once INR < 2.5 since he had a CVA with INR 2.39 in the past."   Unsure how to proceed with heparin drip since pt's procedure is at [facility] rather than at the hospital. Will forward to Dr Radford Pax for recommendation.

## 2017-05-11 NOTE — Telephone Encounter (Signed)
Called spinal center this AM and spoke with Lockie Pares, NP. Relayed message to her that pt is unable to have procedure unless he can be admitted inpatient with a heparin drip per Dr Theodosia Blender recommendation. She was going to check with MD to see if this is possible since this procedure is typically done in the outpatient setting.  Called back 6 hours later since I had not heard a response and had to leave a message with receptionist.  Will await call back.

## 2017-05-11 NOTE — Telephone Encounter (Signed)
Dr Baird Cancer called clinic regarding anticoagulation and states that injections will be done in the joints outside of the spine. They will use smaller than normal needles and are comfortable performing the procedure while pt remains on his Coumadin. Will route to Dr Radford Pax as an Juluis Rainier and send clearance back to [facility] that pt is cleared for procedure if he stays on Coumadin. Pt is aware as well.

## 2017-05-11 NOTE — Telephone Encounter (Signed)
He will need hospitalization with heparin bridge as he is very high risk to hold given current DVT and PFO as well as h/o CVA with INR 2.4.

## 2017-05-17 DIAGNOSIS — Z8673 Personal history of transient ischemic attack (TIA), and cerebral infarction without residual deficits: Secondary | ICD-10-CM | POA: Diagnosis not present

## 2017-05-17 DIAGNOSIS — M47814 Spondylosis without myelopathy or radiculopathy, thoracic region: Secondary | ICD-10-CM | POA: Diagnosis not present

## 2017-05-20 ENCOUNTER — Ambulatory Visit (INDEPENDENT_AMBULATORY_CARE_PROVIDER_SITE_OTHER): Payer: Medicare Other | Admitting: *Deleted

## 2017-05-20 DIAGNOSIS — Z5181 Encounter for therapeutic drug level monitoring: Secondary | ICD-10-CM

## 2017-05-20 DIAGNOSIS — Q211 Atrial septal defect: Secondary | ICD-10-CM

## 2017-05-20 DIAGNOSIS — Z7901 Long term (current) use of anticoagulants: Secondary | ICD-10-CM

## 2017-05-20 DIAGNOSIS — I639 Cerebral infarction, unspecified: Secondary | ICD-10-CM | POA: Diagnosis not present

## 2017-05-20 DIAGNOSIS — Z952 Presence of prosthetic heart valve: Secondary | ICD-10-CM | POA: Diagnosis not present

## 2017-05-20 DIAGNOSIS — Q2112 Patent foramen ovale: Secondary | ICD-10-CM

## 2017-05-20 LAB — POCT INR: INR: 3

## 2017-05-20 NOTE — Patient Instructions (Signed)
Continue  same dosage 1 tablet (3 mg) daily except 1/2 tablet (1.5mg ) on Mondays, Wednesdays, and Friday.  Recheck INR in 3 weeks.  Pease call coumadin clinic with any new medications or if scheduled for any procedures [phone]

## 2017-05-21 ENCOUNTER — Ambulatory Visit (INDEPENDENT_AMBULATORY_CARE_PROVIDER_SITE_OTHER): Payer: Medicare Other | Admitting: Cardiovascular Disease

## 2017-05-21 ENCOUNTER — Encounter (INDEPENDENT_AMBULATORY_CARE_PROVIDER_SITE_OTHER): Payer: Self-pay

## 2017-05-21 ENCOUNTER — Encounter: Payer: Self-pay | Admitting: Cardiovascular Disease

## 2017-05-21 VITALS — BP 110/74 | HR 67 | Ht 70.0 in | Wt 184.8 lb

## 2017-05-21 DIAGNOSIS — Q2112 Patent foramen ovale: Secondary | ICD-10-CM

## 2017-05-21 DIAGNOSIS — Q211 Atrial septal defect: Secondary | ICD-10-CM | POA: Diagnosis not present

## 2017-05-21 NOTE — Patient Instructions (Signed)
Medication Instructions:  Your physician recommends that you continue on your current medications as directed. Please refer to the Current Medication list given to you today.  Labwork: No new orders.   Testing/Procedures: No new orders.   Follow-Up: Dr Burt Knack will review your case and we will contact you with recommended plan.   Any Other Special Instructions Will Be Listed Below (If Applicable).     If you need a refill on your cardiac medications before your next appointment, please call your pharmacy.

## 2017-05-21 NOTE — Progress Notes (Signed)
Cardiology Office Note Date:  05/21/2017   ID:  [name], DOB 02-29-1951, MRN [id number]  PCP:  Orpah Melter, MD  Cardiologist:  Fransico Him, MD  Chief Complaint  Patient presents with  . PFO consult    History of Present Illness: Dhyan Noah. is a 66 y.o. male who presents for evaluation of PFO, referred by Dr Radford Pax.  The patient is here with his wife today. He has a hx of bicuspid aortic stenosis and underwent mechanical aortic valve replacement in 2001.  Since that time he has had recurrent TIA/stroke episodes.  Initially he had slurred speech a few weeks after his valve replacement and he was felt to have had a TIA.  He had some recurrent neurologic symptoms in 20 15/2016 in the setting of an illness and MRI imaging of the brain was done.  This demonstrated multiple small infarcts, with subacute infarcts in the right insula and punctate infarcts in the left frontal cortex as well as old infarcts in the right temporoparietal region.  Findings were felt to be consistent with a central embolic source.  A TEE at that time did not show any plaque in the aortic arch or any thrombus on the patient's mechanical valve.  He was noted to have a PFO.  Recommendations were made to continue oral anticoagulation as well as antiplatelet therapy with aspirin.  The patient then presented in February 2018 with acute onset left-sided weakness, numbness, and vision changes he was found to have a proximal right MCA infarct with large vessel occlusion at the right ICA terminus extending to the right M1 and A1 segments.  His INR was 2.3.  He was managed with mechanical embolectomy and improved immediately.  A surface echocardiogram demonstrated a PFO but no other significant abnormalities.  He was bridged with Lovenox until his INR was back in the range of 2.5-3.5.  When he was seen back in April 2018, a venous duplex study was performed because of his recurrent stroke and known PFO.  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  The patient is now referred for consideration of transcatheter PFO closure.  He denies symptoms of chest pain or shortness of breath.  He is limited by low back problems.  He has had no recurrent neurologic events.  His INR goal is now 3.0 and he continues on low-dose aspirin.  His wife notes in the past whenever he required a procedure, it took a long time to get his INR back into a therapeutic range.   Past Medical History:  Diagnosis Date  . Arthritis   . Atypical moles    melanomna  . Basal cell carcinoma   . Bilateral carotid artery stenosis 08/03/2014   1-39% right and 40-59% left carotid artery stenosis  . Chronic anticoagulation    for mechanical AVR  . Cluster headaches   . CVA (cerebral vascular accident) ([place]) 07/24/2014    MRI indicating 2 punctate foci of acute infarction.  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   . Diverticulosis   . Dizziness   . Episodic recurrent vertigo 2002 & 2005   carotid dopplers w no clinically significant stenosis  . Fatty liver    hx of elevated hepatic transaminases-negative workup in 2009 except for U/S suggesting fatty liver  . GERD (gastroesophageal reflux disease)   . Gout   .  Hyperlipidemia   . Hyperlipidemia LDL goal <70 12/05/2015  . Joint pain   . LBBB (left bundle branch block)   . Melanoma ([place])    hx of melanoma and multiple basal cell carcinomas Dr Tonia Brooms  . PFO (patent foramen ovale) 12/05/2015  . Positive ANA (antinuclear antibody)   . S/P AVR (aortic valve replacement)    mechanical  . TIA (transient ischemic attack)     Past Surgical History:  Procedure Laterality Date  . APPENDECTOMY    . CARDIAC VALVE REPLACEMENT    . CHOLECYSTECTOMY    . DENTAL SURGERY Left bone graft and extraction  . MELANOMA  EXCISION     x3  . TEE WITHOUT CARDIOVERSION N/A 07/31/2014   Procedure: TRANSESOPHAGEAL ECHOCARDIOGRAM (TEE);  Surgeon: Sueanne Margarita, MD;  Location: [facility] ENDOSCOPY;  Service: Cardiovascular;  Laterality: N/A;  . TEE WITHOUT CARDIOVERSION N/A 10/01/2014   Procedure: TRANSESOPHAGEAL ECHOCARDIOGRAM (TEE);  Surgeon: Lelon Perla, MD;  Location: [facility] ENDOSCOPY;  Service: Cardiovascular;  Laterality: N/A;    Current Outpatient Medications  Medication Sig Dispense Refill  . amoxicillin (AMOXIL) 500 MG capsule Take 2,000 mg by mouth as directed. One hour prior to all dental appointments as a pre-treatment    . aspirin 81 MG tablet Take 81 mg by mouth daily.    . colchicine 0.6 MG tablet Take 0.6 mg by mouth daily as needed (gout flares). Once a day as needed for gout flares    . diazepam (VALIUM) 2 MG tablet Take 2 mg by mouth at bedtime as needed (for sleep).     . fenofibrate (TRICOR) 145 MG tablet Take 1 tablet (145 mg total) by mouth daily. 90 tablet 3  . HYDROcodone-acetaminophen (NORCO) 10-325 MG tablet Take 1 tablet by mouth 3 (three) times daily as needed (for back pain).     [name] LINZESS 145 MCG CAPS capsule Take 1 capsule by mouth daily as needed for constipation.  2  . methocarbamol (ROBAXIN) 500 MG tablet Take 500 mg by mouth daily as needed for muscle spasms.     . Multiple Vitamins-Minerals (ONE-A-DAY MENS 50+ ADVANTAGE) TABS Take 1 tablet by mouth daily.    . naloxegol oxalate (MOVANTIK) 25 MG TABS tablet Take 25 mg daily by mouth.    . nortriptyline (PAMELOR) 25 MG capsule Take 25 mg by mouth at bedtime.    . pantoprazole (PROTONIX) 40 MG tablet Take 40 mg by mouth daily.    . pravastatin (PRAVACHOL) 40 MG tablet Take 1 tablet (40 mg total) by mouth daily. 90 tablet 3  . pregabalin (LYRICA) 75 MG capsule Take 150 mg by mouth 2 (two) times daily. Taking 2 tablets each am and 2 tablets each pm     . ranitidine (ZANTAC) 75 MG tablet Take 75 mg by mouth 2 (two) times daily as needed for  heartburn.     . warfarin (COUMADIN) 3 MG tablet Take as directed by coumadin clinic 30 tablet 1   No current facility-administered medications for this visit.     Allergies:   Patient has no known allergies.   Social History:  The patient  reports that  has never smoked. he has never used smokeless tobacco. He reports that he drinks alcohol. He reports that he does not use drugs.   Family History:  The patient's family history includes Cancer in his father and mother; Melanoma in his unknown relative; Psoriasis in his unknown relative; Scoliosis in his sister; Valvular heart disease in his brother.  ROS:  Please see the history of present illness.  Otherwise, review of systems is positive for back pain, snoring, constipation, anxiety.  All other systems are reviewed and negative.    PHYSICAL EXAM: VS:  BP 110/74   Pulse 67   Ht 5\' 10"  (1.778 m)   Wt 184 lb 12.8 oz (83.8 kg)   BMI 26.52 kg/m  , BMI Body mass index is 26.52 kg/m. GEN: Well nourished, well developed, in no acute distress  HEENT: normal  Neck: no JVD, no masses. No carotid bruits Cardiac: RRR with grade 2/6 systolic ejection murmur at the right upper sternal border, normal mechanical A2                Respiratory:  clear to auscultation bilaterally, normal work of breathing GI: soft, nontender, nondistended, + BS MS: no deformity or atrophy  Ext: no pretibial edema, pedal pulses 2+= bilaterally Skin: warm and dry, no rash Neuro:  Strength and sensation are intact Psych: euthymic mood, full affect  EKG:  EKG is ordered today. The ekg ordered today shows normal sinus rhythm 67 bpm, left bundle branch block  Recent Labs: 08/04/2016: ALT 15; BUN 21; Creatinine, Ser 1.10; Hemoglobin 11.2; Platelets 339; Potassium 3.6; Sodium 141   Lipid Panel     Component Value Date/Time   CHOL 149 02/26/2016 1058   TRIG 115 02/26/2016 1058   HDL 67 02/26/2016 1058   CHOLHDL 2.2 02/26/2016 1058   VLDL 23 02/26/2016 1058    LDLCALC 59 02/26/2016 1058      Wt Readings from Last 3 Encounters:  05/21/17 184 lb 12.8 oz (83.8 kg)  05/06/17 185 lb (83.9 kg)  12/09/16 175 lb (79.4 kg)     Cardiac Studies Reviewed: 2D Echo 07-26-2016: Interpretation Summary A complete portable two-dimensional transthoracic echocardiogram with color flow Doppler and Spectral Doppler was performed. Saline contrast injection was performed. Would consider a transesophageal echocardiogram if clinically indicated. There is mild concentric left ventricular hypertrophy with normal wall motion and ejection fraction 55-60%. Grade I mild diastolic dysfunction; abnormal relaxation pattern. The left atrium is mildly dilated. The tricuspid valve leaflets are structurally normal with trace regurgitation. Estimation of right ventricular systolic pressure is not possible. Bioprosthetic valve in the aortic position appears well seated and functions normally. Prosthetic aortic valve peak and/or mean gradients are normal. Injection of contrast documented an interatrial shunt . A patent foramen ovale is suspected. Saline contrast injection was performed. Would consider a transesophageal echocardiogram if clinically indicated.  Left Ventricle There is mild concentric left ventricular hypertrophy with normal wall motion and ejection fraction 55-60%. Grade I mild diastolic dysfunction; abnormal relaxation pattern.   Right Ventricle The right ventricle is grossly normal in size and function.  Atria The left atrium is mildly dilated. Injection of contrast documented an interatrial shunt . A patent foramen ovale is suspected. The right atrium is normal.  Mitral Valve The mitral valve is mildly thickened with trace regurgitation.   Tricuspid Valve The tricuspid valve leaflets are structurally normal with trace regurgitation. Estimation of right ventricular systolic pressure is not possible.  Aortic Valve Bioprosthetic valve in the  aortic position appears well seated and functions normally. Prosthetic aortic valve peak and/or mean gradients are normal.  Pulmonic Valve The pulmonic valve is normal in structure and function. There is trace pulmonic regurgitation.  Vessels The aortic root is normal. The pulmonary artery is not well visualized.  Pericardium There is no pericardial effusion.  MMode/2D Measurements & Calculations IVSd:  1.3 cmLVIDd: 5.2 cm LVIDs: 3.7 cm LVPWd: 1.1 cm  _____________________________________________________________ LV mass(C)d: 245.9 gramsAo root diam: 3.3 cm  LV mass(C)dI: 122.3 grams/m2Ao root area: 8.4 cm2 LA dimension: 4.3 cm _____________________________________________________________  LVOT diam: 2.2 cm LAV (MOD-bp) Index: 24.9 ml/m2 LVOT area: 3.7 cm2 _____________________________________________________________  LAV(MOD-bp): 50.0 ml  Doppler Measurements & Calculations MV E max vel: 68.1 cm/secMV dec time: 0.32 sec MV A max vel: 95.3 cm/sec MV E/A: 0.71 _____________________________________________________________  Ao V2 mean: 248.5 cm/sec LV V1 mean PG: 2.8 mmHg Ao mean PG: 26.5 mmHgLV V1 mean: 78.9 cm/sec Ao V2 VTI: 74.4 cm LV V1 VTI: 27.0 cm AVA(I,D): 1.3 cm2  _____________________________________________________________ SV(LVOT): 99.3 ml  DVT Study 10-13-2016: Impressions Duplex imaging of the right lower extremity reveals the deep and superficial veins, from the groin to the ankle, to be easily compressible, with chronic intraluminal thrombus seen in the mid femoral and acute thrombus seen in the above-the-knee popliteal vein. The left lower extremity deep and superficial veins reveals  them to be easily compressible, without intraluminal thrombus. No evidence of venous incompetence, bilaterally. Technologist Notes Thrombus Compressible Spontaneous Phasic Augmented Competent N =No Y =Yes O =Absent + =Present -- =Variable or Decreased Evidence of chronic, non occlusive, deep venous thrombus in the right mid femoral vein. Evidence of non occlusive, deep venous thrombus, of uncertain age in the right above-the-knee popliteal vein.  No evidence of left lower extremity deep or superficial venous thrombus. No evidence of lower extermity venous incompetence, bilaterally.  Spoke with Dr Recardo Evangelist regarding preliminary  TEE 10-01-2014: Study Conclusions  - Left ventricle: Systolic function was normal. The estimated ejection fraction was in the range of 50% to 55%. - Aortic valve: A mechanical prosthesis was present. There was mild regurgitation. - Mitral valve: No evidence of vegetation. There was mild regurgitation. - Left atrium: The atrium was mildly dilated. No evidence of thrombus in the atrial cavity or appendage. - Right atrium: No evidence of thrombus in the atrial cavity or appendage. - Atrial septum: There was a patent foramen ovale. There was an atrial septal aneurysm. - Tricuspid valve: No evidence of vegetation.  Impressions:  - Low normal LV function; mild LAE; prosthetic aortic valve with mild AI; mild MR and TR; atrial septal aneurysm with positive saline microcavitation study consistent with PFO.  ASSESSMENT AND PLAN: 66 year old male with thoracic aortic aneurysm, mechanical aortic valve replacement, and PFO with recurrent stroke.  He has ultrasound evidence of chronic and age-indeterminate DVT in the right leg.  He has had MRI evidence of multiple infarcts suggestive of a central source of embolism.  The patient understands the complexity of his situation with potential embolic sources including his mechanical aortic valve, atherosclerosis  from his aorta, and paradoxical embolism from PFO.  I have personally reviewed his TEE images from 2016.  He has at least a moderate sized PFO with significant right to left shunt based on agitated saline study.  The defect is mostly a superior defect anatomically seen best at the [facility] border.  I think that technically his PFO can be closed via transcatheter methods with an Amplatzer device.  I do not have any personal experience closing PFO's in patients with a mechanical aortic valve and will check into whether there are any anatomic issues related to this.  I have reviewed the potential risks of transcatheter PFO closure with the patient and his wife.  I would probably do the procedure with him fully anticoagulated because of his history of stroke with  subtherapeutic INR.  He understands there would be a modest increase in bleeding risk although with a transvenous procedure the should not be prohibitive.  He also understands the other risks of device embolization, stroke, arrhythmia, cardiac perforation, MI, and device erosion are all extremely low (less than 1% cumulative).  Alternatively the patient could continue on medical therapy with anticoagulation with a goal INR of 3.0 as well as antiplatelet therapy with aspirin.  He will consider options further and I will touch base with him after I have a chance to review others experience with PFO closure in the setting of mechanical aortic valve. The fact that he has had recurrent stroke in the setting of DVT and PFO raises suspicion that this is a potential cause of his stroke.   Current medicines are reviewed with the patient today.  The patient does not have concerns regarding medicines.  Labs/ tests ordered today include:  Orders Placed This Encounter  Procedures  . EKG 12-Lead   Signed, Sherren Mocha, MD  05/21/2017 8:20 AM    [facility] [place], [place], [place]  [address] Phone: [phone]; Fax: [phone]

## 2017-06-02 ENCOUNTER — Other Ambulatory Visit: Payer: Self-pay | Admitting: Cardiology

## 2017-06-07 DIAGNOSIS — Z8673 Personal history of transient ischemic attack (TIA), and cerebral infarction without residual deficits: Secondary | ICD-10-CM | POA: Diagnosis not present

## 2017-06-07 DIAGNOSIS — Z85828 Personal history of other malignant neoplasm of skin: Secondary | ICD-10-CM | POA: Diagnosis not present

## 2017-06-07 DIAGNOSIS — M47814 Spondylosis without myelopathy or radiculopathy, thoracic region: Secondary | ICD-10-CM | POA: Diagnosis not present

## 2017-06-10 ENCOUNTER — Ambulatory Visit (INDEPENDENT_AMBULATORY_CARE_PROVIDER_SITE_OTHER): Payer: Medicare Other | Admitting: *Deleted

## 2017-06-10 DIAGNOSIS — Z5181 Encounter for therapeutic drug level monitoring: Secondary | ICD-10-CM

## 2017-06-10 DIAGNOSIS — Z7901 Long term (current) use of anticoagulants: Secondary | ICD-10-CM | POA: Diagnosis not present

## 2017-06-10 DIAGNOSIS — I639 Cerebral infarction, unspecified: Secondary | ICD-10-CM

## 2017-06-10 DIAGNOSIS — Z952 Presence of prosthetic heart valve: Secondary | ICD-10-CM

## 2017-06-10 DIAGNOSIS — Q211 Atrial septal defect: Secondary | ICD-10-CM

## 2017-06-10 DIAGNOSIS — Q2112 Patent foramen ovale: Secondary | ICD-10-CM

## 2017-06-10 LAB — POCT INR: INR: 3.2

## 2017-06-10 NOTE — Patient Instructions (Signed)
Description   Continue  same dosage 1 tablet (3 mg) daily except 1/2 tablet (1.5mg ) on Mondays, Wednesdays, and Friday.  Recheck INR in 4 weeks.  Pease call coumadin clinic with any new medications or if scheduled for any procedures [phone]

## 2017-07-02 DIAGNOSIS — G894 Chronic pain syndrome: Secondary | ICD-10-CM | POA: Diagnosis not present

## 2017-07-02 DIAGNOSIS — Z79899 Other long term (current) drug therapy: Secondary | ICD-10-CM | POA: Diagnosis not present

## 2017-07-02 DIAGNOSIS — Z5181 Encounter for therapeutic drug level monitoring: Secondary | ICD-10-CM | POA: Diagnosis not present

## 2017-07-02 DIAGNOSIS — M47814 Spondylosis without myelopathy or radiculopathy, thoracic region: Secondary | ICD-10-CM | POA: Diagnosis not present

## 2017-07-02 DIAGNOSIS — M5414 Radiculopathy, thoracic region: Secondary | ICD-10-CM | POA: Diagnosis not present

## 2017-07-05 ENCOUNTER — Encounter (INDEPENDENT_AMBULATORY_CARE_PROVIDER_SITE_OTHER): Payer: Self-pay

## 2017-07-05 ENCOUNTER — Ambulatory Visit (INDEPENDENT_AMBULATORY_CARE_PROVIDER_SITE_OTHER): Payer: Medicare Other | Admitting: *Deleted

## 2017-07-05 DIAGNOSIS — Q2112 Patent foramen ovale: Secondary | ICD-10-CM

## 2017-07-05 DIAGNOSIS — I639 Cerebral infarction, unspecified: Secondary | ICD-10-CM | POA: Diagnosis not present

## 2017-07-05 DIAGNOSIS — Z7901 Long term (current) use of anticoagulants: Secondary | ICD-10-CM

## 2017-07-05 DIAGNOSIS — Z952 Presence of prosthetic heart valve: Secondary | ICD-10-CM | POA: Diagnosis not present

## 2017-07-05 DIAGNOSIS — Q211 Atrial septal defect: Secondary | ICD-10-CM

## 2017-07-05 LAB — POCT INR: INR: 2.8

## 2017-07-05 NOTE — Patient Instructions (Signed)
Description   Today take 1 tablet today then continue same dosage 1 tablet (3 mg) daily except 1/2 tablet (1.5mg ) on Mondays, Wednesdays, and Friday.  Recheck INR in 4 weeks.  Pease call coumadin clinic with any new medications or if scheduled for any procedures [phone]

## 2017-07-20 ENCOUNTER — Encounter: Payer: Self-pay | Admitting: Cardiovascular Disease

## 2017-07-20 DIAGNOSIS — Z79891 Long term (current) use of opiate analgesic: Secondary | ICD-10-CM | POA: Insufficient documentation

## 2017-07-20 NOTE — Progress Notes (Signed)
Reviewed case with colleagues. Pt seen in office May 21, 2017. Called patient to further discuss treatment options today. He has continued to have back problems that are severely lifestyle limiting and is considering further treatment options, including laser surgery. This will require interruption of anticoagulation.   While it is impossible to determine whether his cardioembolic strokes have been secondary to thrombi from the mechanical aortic prosthesis or paradoxical emboli related to his PFO. I have reviewed his TEE from 2016 and he has at least a moderate PFO with strongly positive micro-cavitation study.   With interruption of anti-coagulation, I think PFO-closure would likely mitigate his risk of recurrent thromboembolism, especially now that DVT has been demonstrated in the context of long-term anticoagulation.   I will communicate with Dr Radford Pax (primary cardiologist) and Dr Posey Pronto (neurology) to get their thoughts. The patient has an upcoming visit with Dr Posey Pronto in February. Advised patient I would FU with him by phone after further review with colleagues.  Sherren Mocha 07/20/2017 2:53 PM

## 2017-07-22 ENCOUNTER — Telehealth: Payer: Self-pay | Admitting: Cardiology

## 2017-07-22 NOTE — Telephone Encounter (Signed)
New message       [facility] Pre-operative Risk Assessment    Request for surgical clearance:  1. What type of surgery is being performed? "spinal surgery" per [facility]  2. When is this surgery scheduled? TBD  3. What type of clearance is required (medical clearance vs. Pharmacy clearance to hold med vs. Both)? BOTH  4. Are there any medications that need to be held prior to surgery and how long?n/a  5. Practice name and name of physician performing surgery? [facility]  6. What is your office phone and fax number? Fax [phone], phone # [phone] Joelene Millin  7. Anesthesia type (None, local, MAC, general) ? [place] 07/22/2017, 2:14 PM  _________________________________________________________________   (provider comments below)

## 2017-07-22 NOTE — Progress Notes (Signed)
Case reviewed with Dr Radford Pax and Dr Posey Pronto, both of whom agree that proceeding with PFO closure is reasonable. Called patient and left message on his voicemail, asking him to call me back in the office tomorrow.

## 2017-07-23 NOTE — Telephone Encounter (Signed)
I am in complete agreement with Dr. Burt Knack

## 2017-07-23 NOTE — Telephone Encounter (Signed)
   Primary Cardiologist: Dr. Radford Pax Chart reviewed as part of pre-operative protocol coverage.   Can you comment regarding clearance as you have recently discussed with patient and other providers regarding recurrent thromboembolism risk?   [place], [place] 07/23/2017, 2:18 PM

## 2017-07-23 NOTE — Telephone Encounter (Signed)
I'm seeing him to discuss PFO closure in the near future. Plan to close his PFO before he has this surgery. I would put this clearance on hold for now.

## 2017-07-23 NOTE — Telephone Encounter (Signed)
I will route this recommendations to surgeon.   Leanor Kail, [place]

## 2017-07-26 ENCOUNTER — Telehealth: Payer: Self-pay

## 2017-07-26 NOTE — Telephone Encounter (Signed)
Spoke with patient and informed him that he would have to be cleared prior to his surgery after seeing Dr. Burt Knack.  He spoke with Dr. Burt Knack who informed him of this situation.

## 2017-07-26 NOTE — Telephone Encounter (Signed)
   Chart reviewed as part of pre-operative protocol coverage.I am not able to confirm that previous app routed to surgeon as chart has remained in this box. To summarize recommendations from Dr. Burt Knack and Dr. Radford Pax, they do not recommend he be cleared for surgery until seen by Dr. Burt Knack and treated for his known PFO with closure. Will route this bundled recommendation to requesting provider via Epic fax function. Please call with questions.   Callback staff, please call patient to advise him that he will need to be seen and treated by Dr. Burt Knack prior to having spine surgery. Dr. Burt Knack will provide further plan for eventual clearance at his upcoming visit.  Charlie Pitter, PA-C 07/26/2017, 9:08 AM

## 2017-07-26 NOTE — Progress Notes (Signed)
Pt has been scheduled for follow-up OV with Dr Burt Knack on 08/05/17.

## 2017-07-29 NOTE — Telephone Encounter (Signed)
Patient scheduled for appointment with Dr. Burt Knack on Aug 05, 2017

## 2017-08-02 ENCOUNTER — Ambulatory Visit (INDEPENDENT_AMBULATORY_CARE_PROVIDER_SITE_OTHER): Payer: Medicare Other

## 2017-08-02 DIAGNOSIS — Q211 Atrial septal defect: Secondary | ICD-10-CM

## 2017-08-02 DIAGNOSIS — Z952 Presence of prosthetic heart valve: Secondary | ICD-10-CM

## 2017-08-02 DIAGNOSIS — Z7901 Long term (current) use of anticoagulants: Secondary | ICD-10-CM

## 2017-08-02 DIAGNOSIS — Q2112 Patent foramen ovale: Secondary | ICD-10-CM

## 2017-08-02 DIAGNOSIS — I639 Cerebral infarction, unspecified: Secondary | ICD-10-CM | POA: Diagnosis not present

## 2017-08-02 LAB — POCT INR: INR: 2.5

## 2017-08-02 NOTE — Patient Instructions (Signed)
Description   Take 1 tablet today, then start taking 1 tablet (3 mg) daily except 1/2 tablet (1.5mg ) on Mondays and Fridays.  Recheck INR in 2 weeks.  Pease call coumadin clinic with any new medications or if scheduled for any procedures [phone]

## 2017-08-03 DIAGNOSIS — D485 Neoplasm of uncertain behavior of skin: Secondary | ICD-10-CM | POA: Diagnosis not present

## 2017-08-03 DIAGNOSIS — L821 Other seborrheic keratosis: Secondary | ICD-10-CM | POA: Diagnosis not present

## 2017-08-03 DIAGNOSIS — C44719 Basal cell carcinoma of skin of left lower limb, including hip: Secondary | ICD-10-CM | POA: Diagnosis not present

## 2017-08-03 DIAGNOSIS — D225 Melanocytic nevi of trunk: Secondary | ICD-10-CM | POA: Diagnosis not present

## 2017-08-03 DIAGNOSIS — Z85820 Personal history of malignant melanoma of skin: Secondary | ICD-10-CM | POA: Diagnosis not present

## 2017-08-03 DIAGNOSIS — L570 Actinic keratosis: Secondary | ICD-10-CM | POA: Diagnosis not present

## 2017-08-03 DIAGNOSIS — D1801 Hemangioma of skin and subcutaneous tissue: Secondary | ICD-10-CM | POA: Diagnosis not present

## 2017-08-03 DIAGNOSIS — Z85828 Personal history of other malignant neoplasm of skin: Secondary | ICD-10-CM | POA: Diagnosis not present

## 2017-08-04 DIAGNOSIS — G894 Chronic pain syndrome: Secondary | ICD-10-CM | POA: Diagnosis not present

## 2017-08-05 ENCOUNTER — Ambulatory Visit (INDEPENDENT_AMBULATORY_CARE_PROVIDER_SITE_OTHER): Payer: Medicare Other | Admitting: Cardiovascular Disease

## 2017-08-05 ENCOUNTER — Telehealth: Payer: Self-pay | Admitting: Pharmacist

## 2017-08-05 ENCOUNTER — Encounter: Payer: Self-pay | Admitting: Cardiovascular Disease

## 2017-08-05 VITALS — BP 110/68 | HR 60 | Ht 70.0 in | Wt 182.8 lb

## 2017-08-05 DIAGNOSIS — I639 Cerebral infarction, unspecified: Secondary | ICD-10-CM | POA: Diagnosis not present

## 2017-08-05 DIAGNOSIS — Q2112 Patent foramen ovale: Secondary | ICD-10-CM

## 2017-08-05 DIAGNOSIS — Q211 Atrial septal defect: Secondary | ICD-10-CM | POA: Diagnosis not present

## 2017-08-05 NOTE — Patient Instructions (Addendum)
Medication Instructions:  Your provider recommends that you continue on your current medications as directed. Please refer to the Current Medication list given to you today.    Labwork: TODAY: BMET, CBC, PT/INR  Testing/Procedures: Dr. Burt Knack recommends you have a PFO CLOSURE.  Follow-Up: You will be called to arrange appropriate follow-up.  Any Other Special Instructions Will Be Listed Below (If Applicable).   [facility] [address] [place] [address] [place] [address] Dept: [phone] Loc: [phone]  Tracy Marshall.  08/05/2017  You are scheduled for a PFO CLOSURE on Wednesday, February 20 with Dr. Sherren Mocha.  1. Please arrive at the [facility] (Main Entrance A) at [facility]: [address] [place], [place] [address] at 6:30 AM (two hours before your procedure to ensure your preparation). Free valet parking service is available.   Special note: Every effort is made to have your procedure done on time. Please understand that emergencies sometimes delay scheduled procedures.  2. Diet: Do not eat or drink anything after midnight prior to your procedure except sips of water to take medications.  3. Labs: TODAY!  4. Medication instructions in preparation for your procedure:  1) See attached for Coumadin instructions  2) Take your other medications as instructed  5. Plan for one night stay--bring personal belongings. 6. Bring a current list of your medications and current insurance cards. 7. You MUST have a responsible person to drive you home. 8. Someone MUST be with you the first 24 hours after you arrive home or your discharge will be delayed. 9. Please wear clothes that are easy to get on and off and wear slip-on shoes.  Thank you for allowing [place] to care for you!   -- [facility]     If you need a refill on your cardiac  medications before your next appointment, please call your pharmacy.

## 2017-08-05 NOTE — Progress Notes (Signed)
Cardiology Office Note Date:  08/06/2017   ID:  Tracy Malta., DOB [birth year], MRN [id number]  PCP:  No primary care provider on file.  Cardiologist:  Sherren Mocha, MD    Chief Complaint  Patient presents with  . Fatigue     History of Present Illness: Tracy Haskell. is a 67 y.o. male who presents for follow-up of PFO and recurrent stroke. He was last seen 05-21-2017. He has a mechanical aortic valve and is treated with long-term anticoagulation with warfarin. 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 I have reviewed his case further with Dr Radford Pax (primary cardiologist) and Dr Posey Pronto (neurology). The patient returns today to discuss transcatheter PFO closure.  He denies any change in his symptoms.  He denies chest pain. He does have generalized fatigue and chronic dyspnea with exertion without any recent change.  No edema, orthopnea, or PND.  He is considering a procedure to help with his back pain.  This involves some type of laser surgery which will require interruption of his anticoagulation.  Past Medical History:  Diagnosis Date  . Arthritis   . Atypical moles    melanomna  . Basal cell carcinoma   . Bilateral carotid artery stenosis 08/03/2014   1-39% right and 40-59% left carotid artery stenosis  . Chronic anticoagulation    for mechanical AVR  . Cluster headaches   . CVA (cerebral vascular accident) ([place]) 07/24/2014    MRI indicating 2 punctate foci of acute infarction.  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   . Diverticulosis   . Dizziness   . Episodic recurrent vertigo 2002 & 2005   carotid dopplers w no clinically significant stenosis  . Fatty liver    hx of elevated hepatic transaminases-negative workup in 2009 except for U/S suggesting fatty liver  . GERD (gastroesophageal reflux disease)     . Gout   . Hyperlipidemia   . Hyperlipidemia LDL goal <70 12/05/2015  . Joint pain   . LBBB (left bundle branch block)   . Melanoma ([place])    hx of melanoma and multiple basal cell carcinomas Dr Tonia Brooms  . PFO (patent foramen ovale) 12/05/2015  . Positive ANA (antinuclear antibody)   . S/P AVR (aortic valve replacement)    mechanical  . TIA (transient ischemic attack)     Past Surgical History:  Procedure Laterality Date  . APPENDECTOMY    . CARDIAC VALVE REPLACEMENT    . CHOLECYSTECTOMY    . DENTAL SURGERY Left bone graft and extraction  . MELANOMA EXCISION     x3  . TEE WITHOUT CARDIOVERSION N/A 07/31/2014   Procedure: TRANSESOPHAGEAL ECHOCARDIOGRAM (TEE);  Surgeon: Sueanne Margarita, MD;  Location: [facility] ENDOSCOPY;  Service: Cardiovascular;  Laterality: N/A;  . TEE WITHOUT CARDIOVERSION N/A 10/01/2014   Procedure: TRANSESOPHAGEAL ECHOCARDIOGRAM (TEE);  Surgeon: Lelon Perla, MD;  Location: [facility] ENDOSCOPY;  Service: Cardiovascular;  Laterality: N/A;    Current Outpatient Medications  Medication Sig Dispense Refill  . amoxicillin (AMOXIL) 500 MG capsule Take 2,000 mg by mouth See admin instructions. Take 2000 mg by mouth one hour prior to dental appointment    . aspirin 81 MG tablet Take 81 mg by mouth daily.    . colchicine 0.6 MG tablet Take 0.6 mg by mouth daily as needed (gout flares).     [name]  diazepam (VALIUM) 2 MG tablet Take 2 mg by mouth at bedtime as needed (for sleep).     . fenofibrate (TRICOR) 145 MG tablet Take 1 tablet (145 mg total) by mouth daily. 90 tablet 3  . HYDROcodone-acetaminophen (NORCO) 10-325 MG tablet Take 1 tablet by mouth 3 (three) times daily as needed (for back pain).     . lubiprostone (AMITIZA) 24 MCG capsule Take 24 mcg by mouth 2 (two) times daily with a meal.    . MAGNESIUM PO Take 1 tablet by mouth 2 (two) times daily.     . methocarbamol (ROBAXIN) 500 MG tablet Take 500 mg by mouth daily as needed for muscle spasms.     . Multiple Vitamins-Minerals  (ONE-A-DAY MENS 50+ ADVANTAGE) TABS Take 1 tablet by mouth daily.    . OXcarbazepine (TRILEPTAL) 150 MG tablet Take 300 mg by mouth 2 (two) times daily.     . pantoprazole (PROTONIX) 40 MG tablet Take 40 mg by mouth daily.    . pravastatin (PRAVACHOL) 40 MG tablet Take 1 tablet (40 mg total) by mouth daily. 90 tablet 3  . pregabalin (LYRICA) 150 MG capsule Take 300 mg by mouth 2 (two) times daily.    . ranitidine (ZANTAC) 75 MG tablet Take 75 mg by mouth 2 (two) times daily as needed for heartburn.     . warfarin (COUMADIN) 3 MG tablet TAKE AS DIRECTED BY COUMADIN CLINIC (Patient taking differently: Take 3 mg by mouth daily on Sunday, Tuesday, Thursday and Saturday. Take 1.5 mg by mouth daily on all other days.) 30 tablet 3  . Ascorbic Acid (VITAMIN C PO) Take 1 tablet by mouth 2 (two) times daily.     No current facility-administered medications for this visit.     Allergies:   Patient has no known allergies.   Social History:  The patient  reports that  has never smoked. he has never used smokeless tobacco. He reports that he drinks alcohol. He reports that he does not use drugs.   Family History:  The patient's  family history includes Cancer in his father and mother; Melanoma in his unknown relative; Psoriasis in his unknown relative; Scoliosis in his sister; Valvular heart disease in his brother.    ROS:  Please see the history of present illness.  Otherwise, review of systems is positive for abdominal pain, back pain, constipation.  All other systems are reviewed and negative.    PHYSICAL EXAM: VS:  BP 110/68   Pulse 60   Ht 5\' 10"  (1.778 m)   Wt 182 lb 12.8 oz (82.9 kg)   BMI 26.23 kg/m  , BMI Body mass index is 26.23 kg/m. GEN: Well nourished, well developed, in no acute distress  HEENT: normal  Neck: no JVD, no masses. No carotid bruits Cardiac: RRR with 2/6 systolic murmur at the right upper sternal border, normal mechanical A2             Respiratory:  clear to  auscultation bilaterally, normal work of breathing GI: soft, nontender, nondistended, + BS MS: no deformity or atrophy  Ext: no pretibial edema, pedal pulses 2+= bilaterally Skin: warm and dry, no rash Neuro:  Strength and sensation are intact Psych: euthymic mood, full affect  EKG:  EKG is not ordered today.  Recent Labs: 08/05/2017: BUN 19; Creatinine, Ser 1.04; Hemoglobin 7.2; Platelets 414; Potassium 4.2; Sodium 140   Lipid Panel     Component Value Date/Time   CHOL 149 02/26/2016 1058  TRIG 115 02/26/2016 1058   HDL 67 02/26/2016 1058   CHOLHDL 2.2 02/26/2016 1058   VLDL 23 02/26/2016 1058   LDLCALC 59 02/26/2016 1058      Wt Readings from Last 3 Encounters:  08/05/17 182 lb 12.8 oz (82.9 kg)  05/21/17 184 lb 12.8 oz (83.8 kg)  05/06/17 185 lb (83.9 kg)     ASSESSMENT AND PLAN: PFO with recurrent TIA/stroke in patient with mechanical aortic prosthesis on long-term anticoagulation and documented DVT. Possible stroke etiologies include paradoxical embolism and embolic event related to his valve prosthesis. With recurrent events and documented DVT, it seems reasonable to proceed with PFO closure. I have reviewed his TEE from 2016 which demonstrated a moderate to large PFO with strongly positive bubble study demonstrating R--->L shunt. I have also reviewed his case with Dr Posey Pronto and Dr Radford Pax and we agree it is reasonable to proceed with PFO closure in this clinical scenario.   The patient is counseled about the association of PFO and cryptogenic stroke. Available clinical trial data is reviewed, specifically those trials comparing transcatheter PFO closure and medical therapy with antiplatelet drugs. The patient understands the potential benefit of PFO closure with respect to secondary stroke reduction compared with medical therapy alone. Specific risks of transcatheter PFO closure are reviewed with the patient. These risks include bleeding, infection, device embolization, stroke,  cardiac perforation, tamponade, arrhythmia, MI, and late device erosion. He understands these serious risks occur at low incidence of < 1%.   We also reviewed anticoagulant management at length and he will be bridged with lovenox while warfarin is held. Since this is a transvenous procedure, will only hold warfarin for 3 days before the procedure. He understands that he should wait 3 months after PFO closure before having back surgery and will still require bridging anticoagulation with his hx of stroke and mechanical aortic valve.   Current medicines are reviewed with the patient today.  The patient does not have concerns regarding medicines.  Labs/ tests ordered today include:   Orders Placed This Encounter  Procedures  . Basic metabolic panel  . CBC with Differential/Platelet    Signed, Sherren Mocha, MD  08/06/2017 7:31 PM    [address], [place], [place]  [address] Phone: [phone]; Fax: [phone]

## 2017-08-05 NOTE — Telephone Encounter (Addendum)
2/16: last dose of Coumadin.  2/17: Inject Lovenox 80mg  in the fatty abdominal tissue at least 2 inches from the belly button twice a day about 12 hours apart, 8am and 8pm rotate sites. No Coumadin.  2/18: Inject Lovenox in the fatty tissue every 12 hours, 8am and 8pm. No Coumadin.  2/19: Inject Lovenox in the fatty tissue in the morning at 8 am (No PM dose). No Coumadin.  2/20: Procedure Day - No Lovenox - Resume Coumadin in the evening or as directed by doctor (take an extra 1/2 tablet of warfarin).  2/21: Resume Lovenox inject in the fatty tissue every 12 hours and take Coumadin.  2/22: Inject Lovenox in the fatty tissue every 12 hours and take Coumadin.  2/23: Inject Lovenox in the fatty tissue every 12 hours and take Coumadin.  2/24: Inject Lovenox in the fatty tissue every 12 hours and take Coumadin.  2/25: Coumadin appt to check INR.  Pt given instructions for PFO closure while in office to see Dr. Burt Knack.   INR today 2.7. Goal of 3.0-3.5  Crcl 59ml/min based on labs from last year. Weight 82.9kg

## 2017-08-06 ENCOUNTER — Encounter: Payer: Self-pay | Admitting: Cardiovascular Disease

## 2017-08-06 LAB — CBC WITH DIFFERENTIAL/PLATELET
BASOS ABS: 0 10*3/uL (ref 0.0–0.2)
Basos: 0 %
EOS (ABSOLUTE): 0.2 10*3/uL (ref 0.0–0.4)
Eos: 4 %
HEMOGLOBIN: 7.2 g/dL — AB (ref 13.0–17.7)
Hematocrit: 25.3 % — ABNORMAL LOW (ref 37.5–51.0)
IMMATURE GRANS (ABS): 0 10*3/uL (ref 0.0–0.1)
Immature Granulocytes: 0 %
LYMPHS: 45 %
Lymphocytes Absolute: 1.9 10*3/uL (ref 0.7–3.1)
MCH: 16.6 pg — AB (ref 26.6–33.0)
MCHC: 28.5 g/dL — AB (ref 31.5–35.7)
MCV: 58 fL — ABNORMAL LOW (ref 79–97)
MONOCYTES: 11 %
Monocytes Absolute: 0.5 10*3/uL (ref 0.1–0.9)
Neutrophils Absolute: 1.7 10*3/uL (ref 1.4–7.0)
Neutrophils: 40 %
PLATELETS: 414 10*3/uL — AB (ref 150–379)
RBC: 4.34 x10E6/uL (ref 4.14–5.80)
RDW: 20.1 % — ABNORMAL HIGH (ref 12.3–15.4)
WBC: 4.4 10*3/uL (ref 3.4–10.8)

## 2017-08-06 LAB — BASIC METABOLIC PANEL
BUN/Creatinine Ratio: 18 (ref 10–24)
BUN: 19 mg/dL (ref 8–27)
CALCIUM: 9.7 mg/dL (ref 8.6–10.2)
CHLORIDE: 100 mmol/L (ref 96–106)
CO2: 23 mmol/L (ref 20–29)
Creatinine, Ser: 1.04 mg/dL (ref 0.76–1.27)
GFR calc Af Amer: 87 mL/min/{1.73_m2} (ref >59)
GFR calc non Af Amer: 75 mL/min/{1.73_m2} (ref >59)
GLUCOSE: 98 mg/dL (ref 65–99)
POTASSIUM: 4.2 mmol/L (ref 3.5–5.2)
Sodium: 140 mmol/L (ref 134–144)

## 2017-08-07 ENCOUNTER — Other Ambulatory Visit: Payer: Self-pay | Admitting: Cardiology

## 2017-08-07 DIAGNOSIS — Z86718 Personal history of other venous thrombosis and embolism: Secondary | ICD-10-CM

## 2017-08-07 MED ORDER — ENOXAPARIN SODIUM 80 MG/0.8ML ~~LOC~~ SOLN: 80.0000 mg | Freq: Two times a day (BID) | SUBCUTANEOUS | 0 refills | Status: DC

## 2017-08-09 ENCOUNTER — Telehealth: Payer: Self-pay | Admitting: Cardiovascular Disease

## 2017-08-09 NOTE — Telephone Encounter (Signed)
Spoke with Joelene Millin from [employer] ) informing her that the patient will not be able to get clearance for his back surgery until 3 months post PFO.[name]  She will then send clearance per Dr Burt Knack.Marland KitchenMarland Kitchen

## 2017-08-09 NOTE — Telephone Encounter (Signed)
ASSESSMENT AND PLAN: PFO with recurrent TIA/stroke in patient with mechanical aortic prosthesis on long-term anticoagulation and documented DVT. Possible stroke etiologies include paradoxical embolism and embolic event related to his valve prosthesis. With recurrent events and documented DVT, it seems reasonable to proceed with PFO closure. I have reviewed his TEE from 2016 which demonstrated a moderate to large PFO with strongly positive bubble study demonstrating R--->L shunt. I have also reviewed his case with Dr Posey Pronto and Dr Radford Pax and we agree it is reasonable to proceed with PFO closure in this clinical scenario.   The patient is counseled about the association of PFO and cryptogenic stroke. Available clinical trial data is reviewed, specifically those trials comparing transcatheter PFO closure and medical therapy with antiplatelet drugs. The patient understands the potential benefit of PFO closure with respect to secondary stroke reduction compared with medical therapy alone. Specific risks of transcatheter PFO closure are reviewed with the patient. These risks include bleeding, infection, device embolization, stroke, cardiac perforation, tamponade, arrhythmia, MI, and late device erosion. He understands these serious risks occur at low incidence of <1%.   We also reviewed anticoagulant management at length and he will be bridged with lovenox while warfarin is held. Since this is a transvenous procedure, will only hold warfarin for 3 days before the procedure. He understands that he should wait 3 months after PFO closure before having back surgery and will still require bridging anticoagulation with his hx of stroke and mechanical aortic valve.   Current medicines are reviewed with the patient today.  The patient does not have concerns regarding medicines.  Labs/ tests ordered today include:

## 2017-08-09 NOTE — Telephone Encounter (Signed)
Tracy Marshall ( [place] ) is calling to check on a Cardiac Clearance form that was faxed back in January

## 2017-08-10 ENCOUNTER — Encounter (HOSPITAL_COMMUNITY): Payer: Self-pay | Admitting: *Deleted

## 2017-08-10 DIAGNOSIS — Z1211 Encounter for screening for malignant neoplasm of colon: Secondary | ICD-10-CM | POA: Diagnosis not present

## 2017-08-10 DIAGNOSIS — K219 Gastro-esophageal reflux disease without esophagitis: Secondary | ICD-10-CM | POA: Diagnosis not present

## 2017-08-10 DIAGNOSIS — Z952 Presence of prosthetic heart valve: Secondary | ICD-10-CM | POA: Diagnosis not present

## 2017-08-10 DIAGNOSIS — I10 Essential (primary) hypertension: Secondary | ICD-10-CM | POA: Diagnosis not present

## 2017-08-10 DIAGNOSIS — Z0000 Encounter for general adult medical examination without abnormal findings: Secondary | ICD-10-CM | POA: Diagnosis not present

## 2017-08-10 DIAGNOSIS — I712 Thoracic aortic aneurysm, without rupture: Secondary | ICD-10-CM | POA: Diagnosis not present

## 2017-08-10 DIAGNOSIS — E7800 Pure hypercholesterolemia, unspecified: Secondary | ICD-10-CM | POA: Diagnosis not present

## 2017-08-10 DIAGNOSIS — F419 Anxiety disorder, unspecified: Secondary | ICD-10-CM | POA: Diagnosis not present

## 2017-08-10 DIAGNOSIS — M109 Gout, unspecified: Secondary | ICD-10-CM | POA: Diagnosis not present

## 2017-08-10 DIAGNOSIS — Z125 Encounter for screening for malignant neoplasm of prostate: Secondary | ICD-10-CM | POA: Diagnosis not present

## 2017-08-10 DIAGNOSIS — D649 Anemia, unspecified: Secondary | ICD-10-CM | POA: Diagnosis not present

## 2017-08-11 ENCOUNTER — Encounter (HOSPITAL_COMMUNITY): Admission: RE | Payer: Self-pay | Source: Ambulatory Visit

## 2017-08-11 ENCOUNTER — Ambulatory Visit (HOSPITAL_COMMUNITY): Admission: RE | Admit: 2017-08-11 | Payer: Medicare Other | Source: Ambulatory Visit | Admitting: Cardiovascular Disease

## 2017-08-11 SURGERY — PATENT FORAMEN OVALE (PFO) CLOSURE
Anesthesia: LOCAL

## 2017-08-12 ENCOUNTER — Encounter (INDEPENDENT_AMBULATORY_CARE_PROVIDER_SITE_OTHER): Payer: Self-pay

## 2017-08-12 ENCOUNTER — Ambulatory Visit (INDEPENDENT_AMBULATORY_CARE_PROVIDER_SITE_OTHER): Payer: Medicare Other | Admitting: Pharmacist

## 2017-08-12 DIAGNOSIS — Q2112 Patent foramen ovale: Secondary | ICD-10-CM

## 2017-08-12 DIAGNOSIS — I639 Cerebral infarction, unspecified: Secondary | ICD-10-CM

## 2017-08-12 DIAGNOSIS — Z7901 Long term (current) use of anticoagulants: Secondary | ICD-10-CM

## 2017-08-12 DIAGNOSIS — Q211 Atrial septal defect: Secondary | ICD-10-CM

## 2017-08-12 DIAGNOSIS — Z952 Presence of prosthetic heart valve: Secondary | ICD-10-CM | POA: Diagnosis not present

## 2017-08-12 LAB — POCT INR: INR: 1.5

## 2017-08-12 NOTE — Patient Instructions (Addendum)
Description   Take 1.5 tablets today, then continue 1 tablet (3 mg) daily except 1/2 tablet (1.5mg ) on Mondays and Fridays.  Recheck INR in 1 week.  [facility] call coumadin clinic with any new medications or if scheduled for any procedures [phone]

## 2017-08-16 ENCOUNTER — Ambulatory Visit (INDEPENDENT_AMBULATORY_CARE_PROVIDER_SITE_OTHER): Payer: Medicare Other | Admitting: Neurology

## 2017-08-16 ENCOUNTER — Encounter: Payer: Self-pay | Admitting: Neurology

## 2017-08-16 VITALS — BP 100/70 | HR 54 | Ht 70.0 in | Wt 182.5 lb

## 2017-08-16 DIAGNOSIS — R413 Other amnesia: Secondary | ICD-10-CM

## 2017-08-16 DIAGNOSIS — I63411 Cerebral infarction due to embolism of right middle cerebral artery: Secondary | ICD-10-CM

## 2017-08-16 NOTE — Progress Notes (Signed)
Follow-up Visit   Date: 08/16/17    Tracy Marshall. MRN: [id number] DOB: [birth year]   Interim History: Tracy Math. is a 67 y.o. right-handed Caucasian male with s/p AVR with mechanical valve (2001) on chronic anticoagulation with coumadin, GERD, melanoma and basal cell carcinoma of the skin, and carotid stenosis (L 40-59% and R 1-39%), and history of stroke x 2 (2016 and 2018) here for follow-up of stroke and discuss plans for PFO closure.  The patient was accompanied to the clinic by wife who also provides collateral information.    History of present illness: In 2001, he developed an episode of slurred speech lasting a few minutes. This occurred several weeks after his valve replacement and was told he had a TIA. He had no residual deficits.  In December 2015, he was visiting his brother and a family member had noticed that he developed left facial droop.  MRI brain in January 2016 showed subacute right insula infarct with 2 foci of punctate left frontal cortex infarcts. There is evidence of old infarcts involving the right temporoparietal region also. These findings are most concerning for central embolic source. However, his TEE did not show any thrombus or unstable plaque of the aortic arch. His left facial weakness gradually improved over the next few months.   Starting around 2016, his wife noticed cognitive slowing and mood changes, described as excitable and grumpy. He was having difficulty with short-term memory.  No problems with managing finances, medications, or other IADLs.  Denies excessive yawning, or inappropriate crying/laughter.  Memory changes slowly improved over the following year and therefore formal neuropsychological testing was not performed.    In February 2018, he was hospitalized for acute onset of left sided weakness, numbness, and vision changes.  CT showed hyperdense basilar and proximal right MCA.  CTA head showed large vessel occlusion at the  right ICA terminus extending to the right M1 and A1 segments.  Due to his INR 2.3, IVtPA was not administered and he was managed with mechanical embolectomy at [facility]. He felt almost immediate improvement and was able to walk the following day.   Echo did not show any thrombus, EF 55-60% and small PFO.  LDL 58 and he was continued on pravastatin 40mg  and Tricor 145mg .  He has been taking aspirin 81mg  for the past year.  He was started on lovenox to coumadin bridge with therapeutic goal 2.5 - 3.5.  Etiology was thought to be secondary to central embolic source in the setting of subtherapeutic INR.  UPDATE 08/16/2017:  He is here for 1 year follow-up visit.  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 He is undergoing evaluation for PFO closure due to concern of central embolic source via PFO.  Pre-op labs showed new findings of anemia with Hbg 7.2, down from 11.2.  This is being evaluated by his PCP; PFO closure is on hold for now.   No interval stroke or TIA.   His wife complains of worsening short-term memory.  He nearly always forgets where is placed his glasses, misses his medications so his wife manages it, and easily forgets details.  He is on a number of pain medications and wife thinks this may be the problem.  He does not sleep well at night because of pain and tends to nap during the day.  She also states that he is more irritable and  grumpy.   He continues to struggle with chronic low back pain. He was travelling to [place] and made an appointment at a [facility] in [place] to see if they could provide pain relief.  They have suggested surgery which may or may nor provide any relief.  He is undecided whether to pursue this or not.    Medications:  Current Outpatient Medications on File Prior to Visit  Medication Sig Dispense Refill  . Ascorbic Acid (VITAMIN C PO) Take 1 tablet by mouth 2  (two) times daily.    [name] aspirin 81 MG tablet Take 81 mg by mouth daily.    . colchicine 0.6 MG tablet Take 0.6 mg by mouth daily as needed (gout flares).     . diazepam (VALIUM) 2 MG tablet Take 2 mg by mouth at bedtime as needed (for sleep).     . fenofibrate (TRICOR) 145 MG tablet Take 1 tablet (145 mg total) by mouth daily. 90 tablet 3  . HYDROcodone-acetaminophen (NORCO) 10-325 MG tablet Take 1 tablet by mouth 3 (three) times daily as needed (for back pain).     . lubiprostone (AMITIZA) 24 MCG capsule Take 24 mcg by mouth 2 (two) times daily with a meal.    . MAGNESIUM PO Take 1 tablet by mouth 2 (two) times daily.     . methocarbamol (ROBAXIN) 500 MG tablet Take 500 mg by mouth daily as needed for muscle spasms.     . Multiple Vitamins-Minerals (ONE-A-DAY MENS 50+ ADVANTAGE) TABS Take 1 tablet by mouth daily.    . OXcarbazepine (TRILEPTAL) 150 MG tablet Take 300 mg by mouth 2 (two) times daily.     . pantoprazole (PROTONIX) 40 MG tablet Take 40 mg by mouth daily.    . pravastatin (PRAVACHOL) 40 MG tablet Take 1 tablet (40 mg total) by mouth daily. 90 tablet 3  . pregabalin (LYRICA) 150 MG capsule Take 300 mg by mouth 2 (two) times daily.    . ranitidine (ZANTAC) 75 MG tablet Take 75 mg by mouth 2 (two) times daily as needed for heartburn.     . warfarin (COUMADIN) 3 MG tablet TAKE AS DIRECTED BY COUMADIN CLINIC (Patient taking differently: Take 3 mg by mouth daily on Sunday, Tuesday, Thursday and Saturday. Take 1.5 mg by mouth daily on all other days.) 30 tablet 3   No current facility-administered medications on file prior to visit.     Allergies: No Known Allergies  Review of Systems:  CONSTITUTIONAL: No fevers, chills, night sweats, or weight loss.  EYES: No visual changes or eye pain ENT: No hearing changes.  No history of nose bleeds.   RESPIRATORY: No cough, wheezing and shortness of breath.   CARDIOVASCULAR: Negative for chest pain, and palpitations.   GI: Negative for  abdominal discomfort, blood in stools or black stools.  No recent change in bowel habits.   GU:  No history of incontinence.   MUSCLOSKELETAL: +history of joint pain or swelling.  No myalgias.   SKIN: Negative for lesions, rash, and itching.   ENDOCRINE: Negative for cold or heat intolerance, polydipsia or goiter.   PSYCH:  + depression or anxiety symptoms.   NEURO: As Above.   Vital Signs:  BP 100/70   Pulse (!) 54   Ht 5\' 10"  (1.778 m)   Wt 182 lb 8 oz (82.8 kg)   SpO2 98%   BMI 26.19 kg/m   Gen:  Well appearing CV:  Regular rate and rhythm, audible valve  click Pulm:  Clear to auscultation Ext:  No edema  Neurological Exam: MENTAL STATUS including orientation to time, place, person, recent and remote memory, attention span and concentration, language, and fund of knowledge is intact.  Speech is not dysarthric.  CRANIAL NERVES:  No visual field defects. Pupils equal round and reactive to light.  Normal conjugate, extra-ocular eye movements in all directions of gaze.  No ptosis. Face is symmetric. Palate elevates symmetrically.  Tongue is midline.  MOTOR:  Motor strength is 5/5 in all extremities.  No pronator drift (improved).  Tone is normal.    MSRs:  Reflexes are 2+/4 throughout.  SENSORY:  Vibration and temperature reduced at the ankles bilaterally  COORDINATION/GAIT:  Normal finger-to- nose-finger.  Intact rapid alternating movements bilaterally.  Gait narrow based and stable.   Data: MRI brain wo contrast 07/21/2014: 2 punctate foci of acute infarction in the left posterior frontal cortex without hemorrhage or mass effect. Late subacute infarction in the right insula and frontal opercular region. Old right temporoparietal cortical and subcortical infarction. Infarctions of different age in both hemispheres suggest embolic infarctions, possibly from the heart or ascending aorta or both carotid bifurcation regions.   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  Carotid Dopplers 08/03/2014: 1-39% right and 40-59% left carotid artery stenosis - repeat dopplers in 1 year  Cardiac monitoring 12/2014:  Heart monitor with no PAF.  Sinus bradycardia as low as 40 bpm.  Normal sinus rhythm.  Sinus tachycardia as high as 120 bpm.  Frequent PAC's and PVC's, ventricular couplets, atrial couplets  Labs 11/14/2014:  Vitamin B12 822, HbA1c 6.0, LDL 119, Chol 210, HDL 43  IR Stroke 08/04/2016:  Right carotid terminus embolus removed with mechanical embolectomy. Intracranial atherosclerosis involving the distal right middle cerebral artery and posterior division origin.   IMPRESSION/PLAN: Mr. Bollman is a 67 year-old man with mechanical AVR on coumadin and aspirin here for follow-up of history of stroke.  His initial stroke manifested with left facial weakness in January 2016 and imaging showed small embolic stroke on the right frontal cortex.  I cannot find what his INR was during this time, as he presented 1 month following symptom onset and it was assumed to be cardioembolic from subtherapeutic INR.   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  Given large vessel occlusion, again thrombus from mechanical valve is suspected in the setting of subtherapeutic INR.  In April 2018, he was found to have asymptomatic RLE DVT on therapeutic anticoagulation.  With the presence of DVT and PFO, risk of stroke is certainly increased.  He is being followed by Dr. Burt Knack for PFO closure.  Later in 2018, patient started exploring surgical management options for his chronic low back pain and since his anticoagulation would need to be interrupted for this, it was mutually decided by his care team and patient to undergo PFO closure to minimize stroke risk.    Recent pre-op labs  showed new findings of anemia (Hgb 7.2) and therefore, his surgery is on hold under anemia work-up is completed.  Aspirin was added to coumadin after he developed stroke in February 2016. From a stroke perspective, it would be okay discontinue aspirin and keep him on anticoagulation alone until his anemia has resolved.  I will seek Dr. Theodosia Blender opinion on  whether he needs aspirin from a cardiac standpoint.   For his low back pain, he is followed by pain management locally but sought an opinion at the [facility] in [place] for surgical management. I have urged him to seek the opinion of his local pain providers to discuss this intervention and whether he would be a good candidate.   With respect to his short-term memory changes, he has many factors contributing including polypharmacy, mood, and less like dementia.  He is agreeable to getting formal neuropsychological testing.  I will see him back in the clinic after his testing.    Greater than 50% of this 40 minute visit was spent in counseling, explanation of diagnosis, planning of further management, and coordination of care.    Thank you for allowing me to participate in patient's care.  If I can answer any additional questions, I would be pleased to do so.    Sincerely,    Donika K. Posey Pronto, DO

## 2017-08-16 NOTE — Patient Instructions (Signed)
Formal neuropsychological testing  Return to clinic after your testing

## 2017-08-16 NOTE — Progress Notes (Signed)
Hi Donika thanks for reaching out about this. ASA and warfarin are guideline-directed with a mechanical valve, but as you point out the combination increases his bleeding risk. I am comfortable with him stopping ASA while he is being evaluated for anemia and maybe we can revisit things once we better understand his bleeding source?  Thanks for seeing him and helping with his care!  Ronalee Belts

## 2017-08-18 DIAGNOSIS — D509 Iron deficiency anemia, unspecified: Secondary | ICD-10-CM | POA: Diagnosis not present

## 2017-08-19 ENCOUNTER — Ambulatory Visit (INDEPENDENT_AMBULATORY_CARE_PROVIDER_SITE_OTHER): Payer: Medicare Other | Admitting: *Deleted

## 2017-08-19 DIAGNOSIS — Q211 Atrial septal defect: Secondary | ICD-10-CM | POA: Diagnosis not present

## 2017-08-19 DIAGNOSIS — Z7901 Long term (current) use of anticoagulants: Secondary | ICD-10-CM

## 2017-08-19 DIAGNOSIS — I639 Cerebral infarction, unspecified: Secondary | ICD-10-CM

## 2017-08-19 DIAGNOSIS — Z952 Presence of prosthetic heart valve: Secondary | ICD-10-CM | POA: Diagnosis not present

## 2017-08-19 DIAGNOSIS — Q2112 Patent foramen ovale: Secondary | ICD-10-CM

## 2017-08-19 LAB — POCT INR: INR: 1.7

## 2017-08-19 NOTE — Patient Instructions (Signed)
Description   Take 1.5 tablets today and tomorrow then start taking 1 tablet (3 mg) daily except 1/2 tablet (1.5mg ) on Mondays.  Recheck INR in 1 week.  Pease call coumadin clinic with any new medications or if scheduled for any procedures [phone]

## 2017-08-23 ENCOUNTER — Other Ambulatory Visit: Payer: Self-pay | Admitting: Gastroenterology

## 2017-08-23 DIAGNOSIS — D509 Iron deficiency anemia, unspecified: Secondary | ICD-10-CM

## 2017-08-26 ENCOUNTER — Ambulatory Visit (INDEPENDENT_AMBULATORY_CARE_PROVIDER_SITE_OTHER): Payer: Medicare Other | Admitting: *Deleted

## 2017-08-26 DIAGNOSIS — Z952 Presence of prosthetic heart valve: Secondary | ICD-10-CM

## 2017-08-26 DIAGNOSIS — I639 Cerebral infarction, unspecified: Secondary | ICD-10-CM

## 2017-08-26 DIAGNOSIS — Z7901 Long term (current) use of anticoagulants: Secondary | ICD-10-CM | POA: Diagnosis not present

## 2017-08-26 DIAGNOSIS — Q211 Atrial septal defect: Secondary | ICD-10-CM

## 2017-08-26 DIAGNOSIS — Q2112 Patent foramen ovale: Secondary | ICD-10-CM

## 2017-08-26 LAB — POCT INR: INR: 1.9

## 2017-08-26 NOTE — Patient Instructions (Signed)
Description   Today take 2 tablets, then change your dose to 1 tablet daily except 1.5 tablets on Mondays and Fridays.  Recheck INR in 1 week.  Pease call coumadin clinic with any new medications or if scheduled for any procedures [phone]

## 2017-08-30 DIAGNOSIS — C44719 Basal cell carcinoma of skin of left lower limb, including hip: Secondary | ICD-10-CM | POA: Diagnosis not present

## 2017-09-02 ENCOUNTER — Ambulatory Visit (INDEPENDENT_AMBULATORY_CARE_PROVIDER_SITE_OTHER): Payer: Medicare Other | Admitting: *Deleted

## 2017-09-02 DIAGNOSIS — I639 Cerebral infarction, unspecified: Secondary | ICD-10-CM | POA: Diagnosis not present

## 2017-09-02 DIAGNOSIS — Z952 Presence of prosthetic heart valve: Secondary | ICD-10-CM

## 2017-09-02 DIAGNOSIS — Q2112 Patent foramen ovale: Secondary | ICD-10-CM

## 2017-09-02 DIAGNOSIS — Z7901 Long term (current) use of anticoagulants: Secondary | ICD-10-CM

## 2017-09-02 DIAGNOSIS — Q211 Atrial septal defect: Secondary | ICD-10-CM

## 2017-09-02 LAB — POCT INR: INR: 2.8

## 2017-09-02 NOTE — Patient Instructions (Signed)
Description   Today take 1.5 tablets, then change your dose to 1 tablet daily except 1.5 tablets on Mondays, Wednesdays and Fridays.  Recheck INR in 1 week.  Pease call coumadin clinic with any new medications or if scheduled for any procedures [phone]

## 2017-09-09 ENCOUNTER — Ambulatory Visit
Admission: RE | Admit: 2017-09-09 | Discharge: 2017-09-09 | Disposition: A | Discharge status: Home / Self Care | Payer: Medicare Other | Source: Ambulatory Visit | Attending: Gastroenterology | Admitting: Gastroenterology

## 2017-09-09 DIAGNOSIS — D509 Iron deficiency anemia, unspecified: Secondary | ICD-10-CM

## 2017-09-09 DIAGNOSIS — D649 Anemia, unspecified: Secondary | ICD-10-CM | POA: Diagnosis not present

## 2017-09-10 ENCOUNTER — Ambulatory Visit (INDEPENDENT_AMBULATORY_CARE_PROVIDER_SITE_OTHER): Payer: Medicare Other | Admitting: Pharmacist

## 2017-09-10 DIAGNOSIS — Q211 Atrial septal defect: Secondary | ICD-10-CM | POA: Diagnosis not present

## 2017-09-10 DIAGNOSIS — Z952 Presence of prosthetic heart valve: Secondary | ICD-10-CM

## 2017-09-10 DIAGNOSIS — Z7901 Long term (current) use of anticoagulants: Secondary | ICD-10-CM | POA: Diagnosis not present

## 2017-09-10 DIAGNOSIS — Q2112 Patent foramen ovale: Secondary | ICD-10-CM

## 2017-09-10 LAB — POCT INR: INR: 3.5

## 2017-09-10 NOTE — Patient Instructions (Signed)
Description   Continue taking 1 tablet daily except 1.5 tablets on Mondays, Wednesdays and Fridays.  Recheck INR in 2 weeks.  Pease call coumadin clinic with any new medications or if scheduled for any procedures [phone]

## 2017-09-22 ENCOUNTER — Telehealth: Payer: Self-pay

## 2017-09-22 NOTE — Telephone Encounter (Signed)
Called patient to discuss PCP work-up for low Hgb since last OV and to make plan for PFO closure.   Left message to call back.

## 2017-09-24 NOTE — Telephone Encounter (Signed)
Patient reports he has a follow-up appointment with GI on Monday.  Informed the patient records will be requested from GI  And they will be notified to see if OK to proceed with PFO closure. He was grateful for assistance.

## 2017-09-27 ENCOUNTER — Ambulatory Visit (INDEPENDENT_AMBULATORY_CARE_PROVIDER_SITE_OTHER): Payer: Medicare Other | Admitting: *Deleted

## 2017-09-27 DIAGNOSIS — Z952 Presence of prosthetic heart valve: Secondary | ICD-10-CM

## 2017-09-27 DIAGNOSIS — Z7901 Long term (current) use of anticoagulants: Secondary | ICD-10-CM

## 2017-09-27 DIAGNOSIS — D509 Iron deficiency anemia, unspecified: Secondary | ICD-10-CM | POA: Diagnosis not present

## 2017-09-27 DIAGNOSIS — Q211 Atrial septal defect: Secondary | ICD-10-CM

## 2017-09-27 DIAGNOSIS — Q2112 Patent foramen ovale: Secondary | ICD-10-CM

## 2017-09-27 LAB — POCT INR: INR: 2.6

## 2017-09-27 NOTE — Patient Instructions (Signed)
Description   Today take 2 tablets then continue taking 1 tablet daily except 1.5 tablets on Mondays, Wednesdays and Fridays.  Recheck INR in 2 weeks.  Pease call coumadin clinic with any new medications or if scheduled for any procedures [phone]

## 2017-09-27 NOTE — Telephone Encounter (Signed)
Confirmed with Dr. Oren Beckmann nurse they are drawing a CBC today. Requested recent OV notes.  Will await lab results.

## 2017-09-28 DIAGNOSIS — M7918 Myalgia, other site: Secondary | ICD-10-CM | POA: Diagnosis not present

## 2017-09-30 NOTE — Telephone Encounter (Signed)
Labs received. Will review with Dr. Burt Knack.

## 2017-09-30 NOTE — Telephone Encounter (Signed)
Requested recent lab results from Dr. Estell Harpin office.

## 2017-10-05 DIAGNOSIS — D509 Iron deficiency anemia, unspecified: Secondary | ICD-10-CM | POA: Diagnosis not present

## 2017-10-07 NOTE — Telephone Encounter (Signed)
Called to get GI clearance to proceed with PFO closure. The patient has an endoscopy scheduled 5/6. Will call and get clearance 5/7.  Called and updated patient. He understands that when he is cleared by GI, he will be contacted to arrange follow-up with Dr. Burt Knack to arrange PFO closure.  He was grateful for call and agrees with treatment plan.

## 2017-10-11 ENCOUNTER — Ambulatory Visit (INDEPENDENT_AMBULATORY_CARE_PROVIDER_SITE_OTHER): Payer: Medicare Other | Admitting: Pharmacist

## 2017-10-11 DIAGNOSIS — Z7901 Long term (current) use of anticoagulants: Secondary | ICD-10-CM

## 2017-10-11 DIAGNOSIS — Q211 Atrial septal defect: Secondary | ICD-10-CM | POA: Diagnosis not present

## 2017-10-11 DIAGNOSIS — Z952 Presence of prosthetic heart valve: Secondary | ICD-10-CM

## 2017-10-11 DIAGNOSIS — Q2112 Patent foramen ovale: Secondary | ICD-10-CM

## 2017-10-11 LAB — POCT INR: INR: 3.4

## 2017-10-11 NOTE — Patient Instructions (Signed)
Description   Continue taking 1 tablet daily except 1.5 tablets on Mondays, Wednesdays, and Fridays.  Recheck INR in 3-4 weeks.  Pease call coumadin clinic with any new medications or if scheduled for any procedures [phone]

## 2017-10-25 DIAGNOSIS — K317 Polyp of stomach and duodenum: Secondary | ICD-10-CM | POA: Diagnosis not present

## 2017-10-25 DIAGNOSIS — D509 Iron deficiency anemia, unspecified: Secondary | ICD-10-CM | POA: Diagnosis not present

## 2017-10-28 ENCOUNTER — Telehealth: Payer: Self-pay | Admitting: Cardiovascular Disease

## 2017-10-28 NOTE — Telephone Encounter (Signed)
New message    Patient calling requesting only to speak with nurse Hulen Shouts (advised she was not available today) Patient wants to discuss scheduling PFO.

## 2017-10-29 NOTE — Telephone Encounter (Signed)
Scheduled patient for 5/23 to discuss/arrange PFO closure.  He understands he will have Coumadin checked that day and discuss bridging. Gave patient tentative date of 5/29 for procedure. He states Dr. Estell Harpin office would like to remove polyps soon.  Called and spoke with Dr. Estell Harpin office. Requested Monday's endoscopy results and clear instructions as to what Dr. Penelope Coop wants to happen next.  His nurse, Marita Kansas, will call back Monday to discuss.

## 2017-11-03 NOTE — Telephone Encounter (Signed)
Called Dr. Estell Harpin office as they have not yet returned call. Was told Dr. Penelope Coop is out of the office this week and will not be back until May 21. Gave Dr. Antionette Char phone number to be passed along to Dr. Penelope Coop. Requested he call Dr. Burt Knack to discuss case prior to 5/23 (when Cardiology will see patient).

## 2017-11-05 DIAGNOSIS — L247 Irritant contact dermatitis due to plants, except food: Secondary | ICD-10-CM | POA: Diagnosis not present

## 2017-11-09 ENCOUNTER — Other Ambulatory Visit: Payer: Self-pay | Admitting: Gastroenterology

## 2017-11-09 ENCOUNTER — Encounter (HOSPITAL_COMMUNITY): Payer: Self-pay

## 2017-11-09 NOTE — Telephone Encounter (Signed)
Dr. Penelope Coop and Dr. Antionette Char instructions will be reviewed at 5/21 OV with Nell Range, [place] and with Coumadin Clinic.  PFO closure scheduled 5/29.

## 2017-11-09 NOTE — Telephone Encounter (Signed)
-----   Message from Sherren Mocha, MD sent at 11/09/2017 10:45 AM EDT ----- Spoke with Dr Bobbe Medico at length about Tracy Marshall. Plan as follows: Stop warfarin 4 days prior to endoscopy/polypectomy scheduled 5/28. Take last lovenox dose evening of 5/27. PFO closure will be done the day following polypectomy on 5/29. Anticipate starting him back on lovenox and warfarin evening of 5/29. Do not take lovenox or warfarin on 5/28.

## 2017-11-10 ENCOUNTER — Telehealth: Payer: Self-pay | Admitting: Pharmacist

## 2017-11-10 NOTE — Progress Notes (Signed)
[name]                                       Cardiology Office Note    Date:  11/11/2017   ID:  [name], DOB [birth year], MRN [id number]  PCP:  Orpah Melter, MD  Cardiologist: Dr. Radford Pax / Dr. Burt Knack (PFO closure)  CC: discuss PFO closure - set up for 5/29  History of Present Illness:  Tracy Marshall. is a 67 y.o. male with a history of thoracic aortic aneurysm, mechanical aortic valve on coumadin, chronic DVT, and PFO with recurrent strokes who presents to clinic for follow up.   He has a hx of bicuspid aortic stenosis and underwent mechanical aortic valve replacement in 2001. Since that time he has had recurrent TIA/stroke episodes.  Initially he had slurred speech a few weeks after his valve replacement and he was felt to have had a TIA.  He had some recurrent neurologic symptoms in 2015/2016 in the setting of an illness and MRI imaging of the brain was done. This demonstrated multiple small infarcts, with subacute infarcts in the right insula and punctate infarcts in the left frontal cortex as well as old infarcts in the right temporoparietal region. Findings were felt to be consistent with a central embolic source.  A TEE at that time did not show any plaque in the aortic arch or any thrombus on the patient's mechanical valve. He was noted to have a PFO.  Recommendations were made to continue oral anticoagulation as well as antiplatelet therapy with aspirin. The patient then presented in February 2018 with acute onset left-sided weakness, numbness, and vision changes he was found to have a proximal right MCA infarct with large vessel occlusion at the right ICA terminus extending to the right M1 and A1 segments. His INR was 2.3. He was managed with mechanical embolectomy and improved immediately.  A surface echocardiogram demonstrated a PFO but no other significant abnormalities.  He was bridged with Lovenox until his INR  was back in the range of 2.5-3.5.  When he was seen back in April 2018, a venous duplex study was performed because of his recurrent stroke and known PFO.  Despite therapeutic anticoagulation, he was diagnosed with chronic DVT in the right mid femoral vein and age-indeterminate DVT in the right above-knee popliteal vein.   He was seen by Dr. Burt Knack in consultation for consideration of transcatheter PFO closure. He  reviewed his TEE from 2016 which demonstrated a moderate to large PFO with strongly positive bubble study demonstrating R--->L shunt. Dr. Burt Knack felt like he would be a candidate for PFO closure given his known PFO with recurrent TIA/stroke in patient with mechanical aortic prosthesis on long-term anticoagulation and documented DVT. This was set up in 07/2017 but then cancelled because pre admission labs revealed a Hg of 7. He was sent to [facility] with GI and underwent an extensive GI work up. No etiology of this anemia was found, but he does have polyps that will require removal. His hemoglobin has now improved to ~12 and he is set up for endoscopy and polypectomy on 5/28. Coumadin is being bridged with Lovenox. Dr.Ganem and Dr. Burt Knack have talked and we plan to do the PFO closure on 5/29 while he is off coumadin.  Today he presents to clinic for follow up. He has been  doing well. Last week he got a rash from poison ivy. He has been on a prednisone taper for this. No CP or SOB. No LE edema, orthopnea or PND. No dizziness or syncope. No blood in stool or urine. No palpitations.     Past Medical History:  Diagnosis Date  . Arthritis   . Atypical moles    melanomna  . Basal cell carcinoma   . Bilateral carotid artery stenosis 08/03/2014   1-39% right and 40-59% left carotid artery stenosis  . Chronic anticoagulation    for mechanical AVR  . Cluster headaches   . CVA (cerebral vascular accident) ([place]) 07/24/2014    MRI indicating 2 punctate foci of acute infarction.  Recurrent CVA s/p  embolectomy 07/2016 from subtherapeutic INR using fluoroquinolone for skin infection.   . Diverticulosis   . Dizziness   . Episodic recurrent vertigo 2002 & 2005   carotid dopplers w no clinically significant stenosis  . Fatty liver    hx of elevated hepatic transaminases-negative workup in 2009 except for U/S suggesting fatty liver  . GERD (gastroesophageal reflux disease)   . Gout   . Hyperlipidemia   . Hyperlipidemia LDL goal <70 12/05/2015  . Joint pain   . LBBB (left bundle branch block)   . Melanoma ([place])    hx of melanoma and multiple basal cell carcinomas Dr Tonia Brooms  . PFO (patent foramen ovale) 12/05/2015  . Positive ANA (antinuclear antibody)   . S/P AVR (aortic valve replacement)    mechanical  . TIA (transient ischemic attack)     Past Surgical History:  Procedure Laterality Date  . APPENDECTOMY    . CARDIAC VALVE REPLACEMENT    . CHOLECYSTECTOMY    . DENTAL SURGERY Left bone graft and extraction  . MELANOMA EXCISION     x3  . TEE WITHOUT CARDIOVERSION N/A 07/31/2014   Procedure: TRANSESOPHAGEAL ECHOCARDIOGRAM (TEE);  Surgeon: Sueanne Margarita, MD;  Location: [facility] ENDOSCOPY;  Service: Cardiovascular;  Laterality: N/A;  . TEE WITHOUT CARDIOVERSION N/A 10/01/2014   Procedure: TRANSESOPHAGEAL ECHOCARDIOGRAM (TEE);  Surgeon: Lelon Perla, MD;  Location: [facility] ENDOSCOPY;  Service: Cardiovascular;  Laterality: N/A;    Current Medications: Outpatient Medications Prior to Visit  Medication Sig Dispense Refill  . acetaminophen (TYLENOL) 500 MG tablet Take 1,000 mg by mouth every 6 (six) hours as needed for moderate pain.    . Ascorbic Acid (VITAMIN C PO) Take 1 tablet by mouth daily.     [name] aspirin 81 MG tablet Take 81 mg by mouth daily.    . colchicine 0.6 MG tablet Take 0.6 mg by mouth daily as needed (gout flares).     . diazepam (VALIUM) 2 MG tablet Take 2 mg by mouth at bedtime as needed (for sleep).     . fenofibrate (TRICOR) 145 MG tablet Take 1 tablet (145 mg total) by  mouth daily. 90 tablet 3  . ferrous sulfate 325 (65 FE) MG tablet Take 325 mg by mouth 2 (two) times daily with a meal.    . fluticasone (FLONASE) 50 MCG/ACT nasal spray Place 2 sprays into both nostrils daily.    [name] HYDROcodone-acetaminophen (NORCO) 10-325 MG tablet Take 1 tablet by mouth daily as needed (for back pain).     . hydrocortisone cream 1 % Apply 1 application topically 2 (two) times daily.    [name] lubiprostone (AMITIZA) 24 MCG capsule Take 24 mcg by mouth 2 (two) times daily with a meal.    . Magnesium 400  MG TABS Take 400 mg by mouth daily.    . methocarbamol (ROBAXIN) 500 MG tablet Take 500 mg by mouth daily as needed for muscle spasms.     . methylPREDNISolone (MEDROL DOSEPAK) 4 MG TBPK tablet Take 4 mg by mouth daily.    . Multiple Vitamins-Minerals (ONE-A-DAY MENS 50+ ADVANTAGE) TABS Take 1 tablet by mouth daily.    . OXcarbazepine (TRILEPTAL) 150 MG tablet Take 300 mg by mouth 2 (two) times daily.     . pantoprazole (PROTONIX) 40 MG tablet Take 40 mg by mouth daily.    . pravastatin (PRAVACHOL) 40 MG tablet Take 1 tablet (40 mg total) by mouth daily. 90 tablet 3  . pregabalin (LYRICA) 150 MG capsule Take 300 mg by mouth 2 (two) times daily.    . ranitidine (ZANTAC) 75 MG tablet Take 75 mg by mouth daily as needed for heartburn.     . warfarin (COUMADIN) 3 MG tablet TAKE AS DIRECTED BY COUMADIN CLINIC (Patient taking differently: Take 3 mg by mouth daily on Sunday, Tuesday, Thursday and Saturday. Take 1.5 mg by mouth daily on Monday, Wednesday and Friday) 30 tablet 3   No facility-administered medications prior to visit.      Allergies:   Patient has no known allergies.   Social History   Socioeconomic History  . Marital status: Married    Spouse name: Not on file  . Number of children: Not on file  . Years of education: Not on file  . Highest education level: Not on file  Occupational History  . Not on file  Social Needs  . Financial resource strain: Not on file  .  Food insecurity:    Worry: Not on file    Inability: Not on file  . Transportation needs:    Medical: Not on file    Non-medical: Not on file  Tobacco Use  . Smoking status: Never Smoker  . Smokeless tobacco: Never Used  Substance and Sexual Activity  . Alcohol use: Yes    Alcohol/week: 0.0 oz    Comment: moderate - 2-3 drinks about 3 times per week of wine, liquor, beer  . Drug use: No  . Sexual activity: Not on file  Lifestyle  . Physical activity:    Days per week: Not on file    Minutes per session: Not on file  . Stress: Not on file  Relationships  . Social connections:    Talks on phone: Not on file    Gets together: Not on file    Attends religious service: Not on file    Active member of club or organization: Not on file    Attends meetings of clubs or organizations: Not on file    Relationship status: Not on file  Other Topics Concern  . Not on file  Social History Narrative   Lives with wife in a one story home.  No children.     Retired from [employer].     Family History:  The patient's family history includes Cancer in his father and mother; Melanoma in his unknown relative; Psoriasis in his unknown relative; Scoliosis in his sister; Valvular heart disease in his brother.     ROS:   Please see the history of present illness.    ROS All other systems reviewed and are negative.   PHYSICAL EXAM:   VS:  BP 130/84   Pulse (!) 50   Ht 5\' 10"  (1.778 m)   Wt 190 lb 6.4 oz (  86.4 kg)   SpO2 97%   BMI 27.32 kg/m    GEN: Well nourished, well developed, in no acute distress  HEENT: normal  Neck: no JVD, carotid bruits, or masses Cardiac: RRR; 2/6 SEM with mechanical click. No rubs, or gallops,no edema  Respiratory:  clear to auscultation bilaterally, normal work of breathing GI: soft, nontender, nondistended, + BS MS: no deformity or atrophy  Skin: warm and dry, no rash Neuro:  Alert and Oriented x 3, Strength and sensation are intact Psych: euthymic  mood, full affect   Wt Readings from Last 3 Encounters:  11/11/17 190 lb 6.4 oz (86.4 kg)  08/16/17 182 lb 8 oz (82.8 kg)  08/05/17 182 lb 12.8 oz (82.9 kg)      Studies/Labs Reviewed:   EKG:  EKG is NOT ordered today.   Recent Labs: 08/05/2017: BUN 19; Creatinine, Ser 1.04; Hemoglobin 7.2; Platelets 414; Potassium 4.2; Sodium 140   Lipid Panel    Component Value Date/Time   CHOL 149 02/26/2016 1058   TRIG 115 02/26/2016 1058   HDL 67 02/26/2016 1058   CHOLHDL 2.2 02/26/2016 1058   VLDL 23 02/26/2016 1058   LDLCALC 59 02/26/2016 1058    Additional studies/ records that were reviewed today include:  2D Echo 07-26-2016: Interpretation Summary A complete portable two-dimensional transthoracic echocardiogram with color flow Doppler and Spectral Doppler was performed. Saline contrast injection was performed. Would consider a transesophageal echocardiogram if clinically indicated. There is mild concentric left ventricular hypertrophy with normal wall motion and ejection fraction 55-60%. Grade I mild diastolic dysfunction; abnormal relaxation pattern. The left atrium is mildly dilated. The tricuspid valve leaflets are structurally normal with trace regurgitation. Estimation of right ventricular systolic pressure is not possible. Bioprosthetic valve in the aortic position appears well seated and functions normally. Prosthetic aortic valve peak and/or mean gradients are normal. Injection of contrast documented an interatrial shunt . A patent foramen ovale is suspected. Saline contrast injection was performed. Would consider a transesophageal echocardiogram if clinically indicated.   DVT Study 10-13-2016: Impressions Duplex imaging of the right lower extremity reveals the deep and superficial veins, from the groin to the ankle, to be easily compressible, with chronic intraluminal thrombus seen in the mid femoral and acute thrombus seen in the above-the-knee popliteal vein.  The left lower extremity deep and superficial veins reveals them to be easily compressible, without intraluminal thrombus. No evidence of venous incompetence, bilaterally. Technologist Notes Thrombus Compressible Spontaneous Phasic Augmented Competent N =No Y =Yes O =Absent + =Present -- =Variable or Decreased Evidence of chronic, non occlusive, deep venous thrombus in the right mid femoral vein. Evidence of non occlusive, deep venous thrombus, of uncertain age in the right above-the-knee popliteal vein.  No evidence of left lower extremity deep or superficial venous thrombus. No evidence of lower extermity venous incompetence, bilaterally.  Spoke with Dr Recardo Evangelist regarding preliminary  TEE 10-01-2014: Study Conclusions - Left ventricle: Systolic function was normal. The estimated ejection fraction was in the range of 50% to 55%. - Aortic valve: A mechanical prosthesis was present. There was mild regurgitation. - Mitral valve: No evidence of vegetation. There was mild regurgitation. - Left atrium: The atrium was mildly dilated. No evidence of thrombus in the atrial cavity or appendage. - Right atrium: No evidence of thrombus in the atrial cavity or appendage. - Atrial septum: There was a patent foramen ovale. There was an atrial septal aneurysm. - Tricuspid valve: No evidence of vegetation. Impressions: - Low normal LV function;  mild LAE; prosthetic aortic valve with mild AI; mild MR and TR; atrial septal aneurysm with positive saline microcavitation study consistent with PFO.    ASSESSMENT & PLAN:   PFO with recurrent stokes: plan for PFO closure on 5/29.   Mechanical AVR on coumadin: see bridging plan outlined below.   Anemia: Hg was up to 12 per Dr. Penelope Coop, will recheck labs today. Endoscopy/polypectomy scheduled 5/28.  Plan as follows: Stop warfarin 4 days prior to endoscopy/polypectomy scheduled 5/28. Take last lovenox dose evening of 5/27. PFO closure  will be done the day following polypectomy on 5/29. Anticipate starting him back on lovenox and warfarin evening of 5/29. Do not take lovenox or warfarin on 5/28. He will be seen in the coumadin clinic after this appointment to go over instructions.     Medication Adjustments/Labs and Tests Ordered: Current medicines are reviewed at length with the patient today.  Concerns regarding medicines are outlined above.  Medication changes, Labs and Tests ordered today are listed in the Patient Instructions below. Patient Instructions  Medication Instructions:  Your provider recommends that you continue on your current medications as directed. Please refer to the Current Medication list given to you today.    Labwork: TODAY! BMET, CBC  Testing/Procedures: Your PFO closure is scheduled! Please see attached for information and instructions.   Follow-Up: You have an appointment scheduled with Nell Range, PA on December 15, 2017 at 2:30PM.  Any Other Special Instructions Will Be Listed Below (If Applicable).     If you need a refill on your cardiac medications before your next appointment, please call your pharmacy.      Signed, Angelena Form, PA-C  11/11/2017 3:13 PM    [facility] [place], [place], [place]  [address] Phone: [phone]; Fax: [phone]

## 2017-11-10 NOTE — H&P (View-Only) (Signed)
[facility] [place]                                       Cardiology Office Note    Date:  11/11/2017   ID:  Tracy Malta., DOB 1951-04-01, MRN [id number]  PCP:  Orpah Melter, MD  Cardiologist: Dr. Radford Pax / Dr. Burt Knack (PFO closure)  CC: discuss PFO closure - set up for 5/29  History of Present Illness:  Tracy Gunkel. is a 67 y.o. male with a history of thoracic aortic aneurysm, mechanical aortic valve on coumadin, chronic DVT, and PFO with recurrent strokes who presents to clinic for follow up.   He has a hx of bicuspid aortic stenosis and underwent mechanical aortic valve replacement in 2001. Since that time he has had recurrent TIA/stroke episodes.  Initially he had slurred speech a few weeks after his valve replacement and he was felt to have had a TIA.  He had some recurrent neurologic symptoms in 2015/2016 in the setting of an illness and MRI imaging of the brain was done. This demonstrated multiple small infarcts, with subacute infarcts in the right insula and punctate infarcts in the left frontal cortex as well as old infarcts in the right temporoparietal region. Findings were felt to be consistent with a central embolic source.  A TEE at that time did not show any plaque in the aortic arch or any thrombus on the patient's mechanical valve. He was noted to have a PFO.  Recommendations were made to continue oral anticoagulation as well as antiplatelet therapy with aspirin. The patient then presented in February 2018 with acute onset left-sided weakness, numbness, and vision changes he was found to have a proximal right MCA infarct with large vessel occlusion at the right ICA terminus extending to the right M1 and A1 segments. His INR was 2.3. He was managed with mechanical embolectomy and improved immediately.  A surface echocardiogram demonstrated a PFO but no other significant abnormalities.  He was bridged with Lovenox until his INR  was back in the range of 2.5-3.5.  When he was seen back in April 2018, a venous duplex study was performed because of his recurrent stroke and known PFO.  Despite therapeutic anticoagulation, he was diagnosed with chronic DVT in the right mid femoral vein and age-indeterminate DVT in the right above-knee popliteal vein.   He was seen by Dr. Burt Knack in consultation for consideration of transcatheter PFO closure. He  reviewed his TEE from 2016 which demonstrated a moderate to large PFO with strongly positive bubble study demonstrating R--->L shunt. Dr. Burt Knack felt like he would be a candidate for PFO closure given his known PFO with recurrent TIA/stroke in patient with mechanical aortic prosthesis on long-term anticoagulation and documented DVT. This was set up in 07/2017 but then cancelled because pre admission labs revealed a Hg of 7. He was sent to [facility] with GI and underwent an extensive GI work up. No etiology of this anemia was found, but he does have polyps that will require removal. His hemoglobin has now improved to ~12 and he is set up for endoscopy and polypectomy on 5/28. Coumadin is being bridged with Lovenox. Dr.Ganem and Dr. Burt Knack have talked and we plan to do the PFO closure on 5/29 while he is off coumadin.  Today he presents to clinic for follow up. He has been  doing well. Last week he got a rash from poison ivy. He has been on a prednisone taper for this. No CP or SOB. No LE edema, orthopnea or PND. No dizziness or syncope. No blood in stool or urine. No palpitations.     Past Medical History:  Diagnosis Date  . Arthritis   . Atypical moles    melanomna  . Basal cell carcinoma   . Bilateral carotid artery stenosis 08/03/2014   1-39% right and 40-59% left carotid artery stenosis  . Chronic anticoagulation    for mechanical AVR  . Cluster headaches   . CVA (cerebral vascular accident) ([place]) 07/24/2014    MRI indicating 2 punctate foci of acute infarction.  Recurrent CVA s/p  embolectomy 07/2016 from subtherapeutic INR using fluoroquinolone for skin infection.   . Diverticulosis   . Dizziness   . Episodic recurrent vertigo 2002 & 2005   carotid dopplers w no clinically significant stenosis  . Fatty liver    hx of elevated hepatic transaminases-negative workup in 2009 except for U/S suggesting fatty liver  . GERD (gastroesophageal reflux disease)   . Gout   . Hyperlipidemia   . Hyperlipidemia LDL goal <70 12/05/2015  . Joint pain   . LBBB (left bundle branch block)   . Melanoma ([place])    hx of melanoma and multiple basal cell carcinomas Dr Tonia Brooms  . PFO (patent foramen ovale) 12/05/2015  . Positive ANA (antinuclear antibody)   . S/P AVR (aortic valve replacement)    mechanical  . TIA (transient ischemic attack)     Past Surgical History:  Procedure Laterality Date  . APPENDECTOMY    . CARDIAC VALVE REPLACEMENT    . CHOLECYSTECTOMY    . DENTAL SURGERY Left bone graft and extraction  . MELANOMA EXCISION     x3  . TEE WITHOUT CARDIOVERSION N/A 07/31/2014   Procedure: TRANSESOPHAGEAL ECHOCARDIOGRAM (TEE);  Surgeon: Sueanne Margarita, MD;  Location: [facility] ENDOSCOPY;  Service: Cardiovascular;  Laterality: N/A;  . TEE WITHOUT CARDIOVERSION N/A 10/01/2014   Procedure: TRANSESOPHAGEAL ECHOCARDIOGRAM (TEE);  Surgeon: Lelon Perla, MD;  Location: [facility] ENDOSCOPY;  Service: Cardiovascular;  Laterality: N/A;    Current Medications: Outpatient Medications Prior to Visit  Medication Sig Dispense Refill  . acetaminophen (TYLENOL) 500 MG tablet Take 1,000 mg by mouth every 6 (six) hours as needed for moderate pain.    . Ascorbic Acid (VITAMIN C PO) Take 1 tablet by mouth daily.     [name] aspirin 81 MG tablet Take 81 mg by mouth daily.    . colchicine 0.6 MG tablet Take 0.6 mg by mouth daily as needed (gout flares).     . diazepam (VALIUM) 2 MG tablet Take 2 mg by mouth at bedtime as needed (for sleep).     . fenofibrate (TRICOR) 145 MG tablet Take 1 tablet (145 mg total) by  mouth daily. 90 tablet 3  . ferrous sulfate 325 (65 FE) MG tablet Take 325 mg by mouth 2 (two) times daily with a meal.    . fluticasone (FLONASE) 50 MCG/ACT nasal spray Place 2 sprays into both nostrils daily.    [name] HYDROcodone-acetaminophen (NORCO) 10-325 MG tablet Take 1 tablet by mouth daily as needed (for back pain).     . hydrocortisone cream 1 % Apply 1 application topically 2 (two) times daily.    [name] lubiprostone (AMITIZA) 24 MCG capsule Take 24 mcg by mouth 2 (two) times daily with a meal.    . Magnesium 400  MG TABS Take 400 mg by mouth daily.    . methocarbamol (ROBAXIN) 500 MG tablet Take 500 mg by mouth daily as needed for muscle spasms.     . methylPREDNISolone (MEDROL DOSEPAK) 4 MG TBPK tablet Take 4 mg by mouth daily.    . Multiple Vitamins-Minerals (ONE-A-DAY MENS 50+ ADVANTAGE) TABS Take 1 tablet by mouth daily.    . OXcarbazepine (TRILEPTAL) 150 MG tablet Take 300 mg by mouth 2 (two) times daily.     . pantoprazole (PROTONIX) 40 MG tablet Take 40 mg by mouth daily.    . pravastatin (PRAVACHOL) 40 MG tablet Take 1 tablet (40 mg total) by mouth daily. 90 tablet 3  . pregabalin (LYRICA) 150 MG capsule Take 300 mg by mouth 2 (two) times daily.    . ranitidine (ZANTAC) 75 MG tablet Take 75 mg by mouth daily as needed for heartburn.     . warfarin (COUMADIN) 3 MG tablet TAKE AS DIRECTED BY COUMADIN CLINIC (Patient taking differently: Take 3 mg by mouth daily on Sunday, Tuesday, Thursday and Saturday. Take 1.5 mg by mouth daily on Monday, Wednesday and Friday) 30 tablet 3   No facility-administered medications prior to visit.      Allergies:   Patient has no known allergies.   Social History   Socioeconomic History  . Marital status: Married    Spouse name: Not on file  . Number of children: Not on file  . Years of education: Not on file  . Highest education level: Not on file  Occupational History  . Not on file  Social Needs  . Financial resource strain: Not on file  .  Food insecurity:    Worry: Not on file    Inability: Not on file  . Transportation needs:    Medical: Not on file    Non-medical: Not on file  Tobacco Use  . Smoking status: Never Smoker  . Smokeless tobacco: Never Used  Substance and Sexual Activity  . Alcohol use: Yes    Alcohol/week: 0.0 oz    Comment: moderate - 2-3 drinks about 3 times per week of wine, liquor, beer  . Drug use: No  . Sexual activity: Not on file  Lifestyle  . Physical activity:    Days per week: Not on file    Minutes per session: Not on file  . Stress: Not on file  Relationships  . Social connections:    Talks on phone: Not on file    Gets together: Not on file    Attends religious service: Not on file    Active member of club or organization: Not on file    Attends meetings of clubs or organizations: Not on file    Relationship status: Not on file  Other Topics Concern  . Not on file  Social History Narrative   Lives with wife in a one story home.  No children.     Retired from [employer].     Family History:  The patient's family history includes Cancer in his father and mother; Melanoma in his unknown relative; Psoriasis in his unknown relative; Scoliosis in his sister; Valvular heart disease in his brother.     ROS:   Please see the history of present illness.    ROS All other systems reviewed and are negative.   PHYSICAL EXAM:   VS:  BP 130/84   Pulse (!) 50   Ht 5\' 10"  (1.778 m)   Wt 190 lb 6.4 oz (  86.4 kg)   SpO2 97%   BMI 27.32 kg/m    GEN: Well nourished, well developed, in no acute distress  HEENT: normal  Neck: no JVD, carotid bruits, or masses Cardiac: RRR; 2/6 SEM with mechanical click. No rubs, or gallops,no edema  Respiratory:  clear to auscultation bilaterally, normal work of breathing GI: soft, nontender, nondistended, + BS MS: no deformity or atrophy  Skin: warm and dry, no rash Neuro:  Alert and Oriented x 3, Strength and sensation are intact Psych: euthymic  mood, full affect   Wt Readings from Last 3 Encounters:  11/11/17 190 lb 6.4 oz (86.4 kg)  08/16/17 182 lb 8 oz (82.8 kg)  08/05/17 182 lb 12.8 oz (82.9 kg)      Studies/Labs Reviewed:   EKG:  EKG is NOT ordered today.   Recent Labs: 08/05/2017: BUN 19; Creatinine, Ser 1.04; Hemoglobin 7.2; Platelets 414; Potassium 4.2; Sodium 140   Lipid Panel    Component Value Date/Time   CHOL 149 02/26/2016 1058   TRIG 115 02/26/2016 1058   HDL 67 02/26/2016 1058   CHOLHDL 2.2 02/26/2016 1058   VLDL 23 02/26/2016 1058   LDLCALC 59 02/26/2016 1058    Additional studies/ records that were reviewed today include:  2D Echo 07-26-2016: Interpretation Summary A complete portable two-dimensional transthoracic echocardiogram with color flow Doppler and Spectral Doppler was performed. Saline contrast injection was performed. Would consider a transesophageal echocardiogram if clinically indicated. There is mild concentric left ventricular hypertrophy with normal wall motion and ejection fraction 55-60%. Grade I mild diastolic dysfunction; abnormal relaxation pattern. The left atrium is mildly dilated. The tricuspid valve leaflets are structurally normal with trace regurgitation. Estimation of right ventricular systolic pressure is not possible. Bioprosthetic valve in the aortic position appears well seated and functions normally. Prosthetic aortic valve peak and/or mean gradients are normal. Injection of contrast documented an interatrial shunt . A patent foramen ovale is suspected. Saline contrast injection was performed. Would consider a transesophageal echocardiogram if clinically indicated.   DVT Study 10-13-2016: Impressions Duplex imaging of the right lower extremity reveals the deep and superficial veins, from the groin to the ankle, to be easily compressible, with chronic intraluminal thrombus seen in the mid femoral and acute thrombus seen in the above-the-knee popliteal vein.  The left lower extremity deep and superficial veins reveals them to be easily compressible, without intraluminal thrombus. No evidence of venous incompetence, bilaterally. Technologist Notes Thrombus Compressible Spontaneous Phasic Augmented Competent N =No Y =Yes O =Absent + =Present -- =Variable or Decreased Evidence of chronic, non occlusive, deep venous thrombus in the right mid femoral vein. Evidence of non occlusive, deep venous thrombus, of uncertain age in the right above-the-knee popliteal vein.  No evidence of left lower extremity deep or superficial venous thrombus. No evidence of lower extermity venous incompetence, bilaterally.  Spoke with Dr Recardo Evangelist regarding preliminary  TEE 10-01-2014: Study Conclusions - Left ventricle: Systolic function was normal. The estimated ejection fraction was in the range of 50% to 55%. - Aortic valve: A mechanical prosthesis was present. There was mild regurgitation. - Mitral valve: No evidence of vegetation. There was mild regurgitation. - Left atrium: The atrium was mildly dilated. No evidence of thrombus in the atrial cavity or appendage. - Right atrium: No evidence of thrombus in the atrial cavity or appendage. - Atrial septum: There was a patent foramen ovale. There was an atrial septal aneurysm. - Tricuspid valve: No evidence of vegetation. Impressions: - Low normal LV function;  mild LAE; prosthetic aortic valve with mild AI; mild MR and TR; atrial septal aneurysm with positive saline microcavitation study consistent with PFO.    ASSESSMENT & PLAN:   PFO with recurrent stokes: plan for PFO closure on 5/29.   Mechanical AVR on coumadin: see bridging plan outlined below.   Anemia: Hg was up to 12 per Dr. Penelope Coop, will recheck labs today. Endoscopy/polypectomy scheduled 5/28.  Plan as follows: Stop warfarin 4 days prior to endoscopy/polypectomy scheduled 5/28. Take last lovenox dose evening of 5/27. PFO closure  will be done the day following polypectomy on 5/29. Anticipate starting him back on lovenox and warfarin evening of 5/29. Do not take lovenox or warfarin on 5/28. He will be seen in the coumadin clinic after this appointment to go over instructions.     Medication Adjustments/Labs and Tests Ordered: Current medicines are reviewed at length with the patient today.  Concerns regarding medicines are outlined above.  Medication changes, Labs and Tests ordered today are listed in the Patient Instructions below. Patient Instructions  Medication Instructions:  Your provider recommends that you continue on your current medications as directed. Please refer to the Current Medication list given to you today.    Labwork: TODAY! BMET, CBC  Testing/Procedures: Your PFO closure is scheduled! Please see attached for information and instructions.   Follow-Up: You have an appointment scheduled with Nell Range, PA on December 15, 2017 at 2:30PM.  Any Other Special Instructions Will Be Listed Below (If Applicable).     If you need a refill on your cardiac medications before your next appointment, please call your pharmacy.      Signed, Angelena Form, PA-C  11/11/2017 3:13 PM    [facility] [address], [place], [place]  [address] Phone: [phone]; Fax: [phone]

## 2017-11-10 NOTE — Telephone Encounter (Signed)
-----   Message from Theodoro Parma, RN sent at 11/09/2017 11:55 AM EDT -----   ----- Message ----- From: Sherren Mocha, MD Sent: 11/09/2017  10:45 AM To: Theodoro Parma, RN  Spoke with Dr Bobbe Medico at length about Tracy Marshall. Plan as follows: Stop warfarin 4 days prior to endoscopy/polypectomy scheduled 5/28. Take last lovenox dose evening of 5/27. PFO closure will be done the day following polypectomy on 5/29. Anticipate starting him back on lovenox and warfarin evening of 5/29. Do not take lovenox or warfarin on 5/28.

## 2017-11-10 NOTE — Telephone Encounter (Signed)
Will coordinate Lovenox bridge as instructed below at appt tomorrow in Coumadin clinic.

## 2017-11-11 ENCOUNTER — Encounter (HOSPITAL_COMMUNITY): Payer: Self-pay

## 2017-11-11 ENCOUNTER — Ambulatory Visit (INDEPENDENT_AMBULATORY_CARE_PROVIDER_SITE_OTHER): Payer: Medicare Other | Admitting: Physician Assistant

## 2017-11-11 ENCOUNTER — Ambulatory Visit (INDEPENDENT_AMBULATORY_CARE_PROVIDER_SITE_OTHER): Payer: Medicare Other | Admitting: Pharmacist

## 2017-11-11 ENCOUNTER — Other Ambulatory Visit: Payer: Self-pay

## 2017-11-11 ENCOUNTER — Encounter: Payer: Self-pay | Admitting: Physician Assistant

## 2017-11-11 VITALS — BP 130/84 | HR 50 | Ht 70.0 in | Wt 190.4 lb

## 2017-11-11 DIAGNOSIS — Z952 Presence of prosthetic heart valve: Secondary | ICD-10-CM

## 2017-11-11 DIAGNOSIS — Z86718 Personal history of other venous thrombosis and embolism: Secondary | ICD-10-CM | POA: Diagnosis not present

## 2017-11-11 DIAGNOSIS — I63411 Cerebral infarction due to embolism of right middle cerebral artery: Secondary | ICD-10-CM

## 2017-11-11 DIAGNOSIS — Q2112 Patent foramen ovale: Secondary | ICD-10-CM

## 2017-11-11 DIAGNOSIS — Q211 Atrial septal defect: Secondary | ICD-10-CM

## 2017-11-11 DIAGNOSIS — Z7901 Long term (current) use of anticoagulants: Secondary | ICD-10-CM

## 2017-11-11 LAB — POCT INR: INR: 2.7 (ref 2.0–3.0)

## 2017-11-11 MED ORDER — ENOXAPARIN SODIUM 80 MG/0.8ML ~~LOC~~ SOLN: 80.0000 mg | Freq: Two times a day (BID) | SUBCUTANEOUS | 1 refills | Status: DC

## 2017-11-11 NOTE — Patient Instructions (Addendum)
Medication Instructions:  Your provider recommends that you continue on your current medications as directed. Please refer to the Current Medication list given to you today.    Labwork: TODAY! BMET, CBC  Testing/Procedures: Your PFO closure is scheduled! Please see attached for information and instructions.   Follow-Up: You have an appointment scheduled with Nell Range, PA on December 15, 2017 at 2:30PM.  Any Other Special Instructions Will Be Listed Below (If Applicable).     If you need a refill on your cardiac medications before your next appointment, please call your pharmacy.

## 2017-11-11 NOTE — Patient Instructions (Addendum)
Description   Take 1.5 tablets today then continue taking 1 tablet daily except 1.5 tablets on Mondays, Wednesdays, and Fridays.  Follow instructions below for procedure.   Pease call coumadin clinic with any new medications or if scheduled for any procedures [phone]      5/23: Last dose of Coumadin - 1.5 tablets.  5/24: No Coumadin or Lovenox.  5/25: Inject Lovenox 80mg  in the fatty abdominal tissue at least 2 inches from the belly button twice a day about 12 hours apart, 8am and 8pm rotate sites. No Coumadin.  5/26: Inject Lovenox in the fatty tissue every 12 hours, 8am and 8pm. No Coumadin.  5/27: Inject Lovenox in the fatty tissue in the morning at 8 am (No PM dose). No Coumadin.  5/28: Procedure Day 1 (endoscopy/polypectomy) - No Lovenox or Coumadin.  5/29: Procedure Day 2 (PFO) Resume Lovenox injection and Coumadin in the evening or as directed by doctor  5/30: Inject Lovenox in the fatty tissue every 12 hours and take Coumadin.  5/31: Inject Lovenox in the fatty tissue every 12 hours and take Coumadin.  6/1: Inject Lovenox in the fatty tissue every 12 hours and take Coumadin.  6/2: Inject Lovenox in the fatty tissue every 12 hours and take Coumadin.  6/3: Coumadin appt to check INR.

## 2017-11-12 ENCOUNTER — Other Ambulatory Visit: Payer: Self-pay | Admitting: Thoracic Surgery (Cardiothoracic Vascular Surgery)

## 2017-11-12 DIAGNOSIS — I712 Thoracic aortic aneurysm, without rupture, unspecified: Secondary | ICD-10-CM

## 2017-11-12 LAB — BASIC METABOLIC PANEL
BUN / CREAT RATIO: 19 (ref 10–24)
BUN: 20 mg/dL (ref 8–27)
CHLORIDE: 100 mmol/L (ref 96–106)
CO2: 22 mmol/L (ref 20–29)
Calcium: 10.1 mg/dL (ref 8.6–10.2)
Creatinine, Ser: 1.08 mg/dL (ref 0.76–1.27)
GFR calc non Af Amer: 72 mL/min/{1.73_m2} (ref >59)
GFR, EST AFRICAN AMERICAN: 83 mL/min/{1.73_m2} (ref >59)
Glucose: 120 mg/dL — ABNORMAL HIGH (ref 65–99)
POTASSIUM: 4.5 mmol/L (ref 3.5–5.2)
SODIUM: 140 mmol/L (ref 134–144)

## 2017-11-12 LAB — CBC WITH DIFFERENTIAL/PLATELET
BASOS ABS: 0 10*3/uL (ref 0.0–0.2)
BASOS: 0 %
EOS (ABSOLUTE): 0 10*3/uL (ref 0.0–0.4)
Eos: 0 %
Hematocrit: 45.4 % (ref 37.5–51.0)
Hemoglobin: 14.9 g/dL (ref 13.0–17.7)
IMMATURE GRANULOCYTES: 1 %
Immature Grans (Abs): 0.1 10*3/uL (ref 0.0–0.1)
LYMPHS ABS: 1.6 10*3/uL (ref 0.7–3.1)
Lymphs: 21 %
MCH: 26.8 pg (ref 26.6–33.0)
MCHC: 32.8 g/dL (ref 31.5–35.7)
MCV: 82 fL (ref 79–97)
Monocytes Absolute: 0.6 10*3/uL (ref 0.1–0.9)
Monocytes: 7 %
Neutrophils Absolute: 5.3 10*3/uL (ref 1.4–7.0)
Neutrophils: 71 %
PLATELETS: 285 10*3/uL (ref 150–450)
RBC: 5.57 x10E6/uL (ref 4.14–5.80)
RDW: 24.9 % — ABNORMAL HIGH (ref 12.3–15.4)
WBC: 7.6 10*3/uL (ref 3.4–10.8)

## 2017-11-16 ENCOUNTER — Ambulatory Visit (HOSPITAL_COMMUNITY)
Admission: RE | Admit: 2017-11-16 | Discharge: 2017-11-16 | Disposition: A | Discharge status: Home / Self Care | Payer: Medicare Other | Source: Ambulatory Visit | Attending: Gastroenterology | Admitting: Gastroenterology

## 2017-11-16 ENCOUNTER — Other Ambulatory Visit: Payer: Self-pay

## 2017-11-16 ENCOUNTER — Encounter (HOSPITAL_COMMUNITY): Payer: Self-pay | Admitting: Certified Registered Nurse Anesthetist

## 2017-11-16 ENCOUNTER — Ambulatory Visit (HOSPITAL_COMMUNITY): Payer: Medicare Other | Admitting: Certified Registered Nurse Anesthetist

## 2017-11-16 ENCOUNTER — Encounter (HOSPITAL_COMMUNITY)
Admission: RE | Disposition: A | Discharge status: Home / Self Care | Payer: Self-pay | Source: Ambulatory Visit | Attending: Gastroenterology

## 2017-11-16 DIAGNOSIS — Z7901 Long term (current) use of anticoagulants: Secondary | ICD-10-CM | POA: Insufficient documentation

## 2017-11-16 DIAGNOSIS — E785 Hyperlipidemia, unspecified: Secondary | ICD-10-CM | POA: Diagnosis not present

## 2017-11-16 DIAGNOSIS — K219 Gastro-esophageal reflux disease without esophagitis: Secondary | ICD-10-CM | POA: Insufficient documentation

## 2017-11-16 DIAGNOSIS — D509 Iron deficiency anemia, unspecified: Secondary | ICD-10-CM | POA: Diagnosis not present

## 2017-11-16 DIAGNOSIS — Z7982 Long term (current) use of aspirin: Secondary | ICD-10-CM | POA: Diagnosis not present

## 2017-11-16 DIAGNOSIS — K317 Polyp of stomach and duodenum: Secondary | ICD-10-CM | POA: Insufficient documentation

## 2017-11-16 DIAGNOSIS — K259 Gastric ulcer, unspecified as acute or chronic, without hemorrhage or perforation: Secondary | ICD-10-CM | POA: Insufficient documentation

## 2017-11-16 DIAGNOSIS — I739 Peripheral vascular disease, unspecified: Secondary | ICD-10-CM | POA: Insufficient documentation

## 2017-11-16 DIAGNOSIS — I4891 Unspecified atrial fibrillation: Secondary | ICD-10-CM | POA: Insufficient documentation

## 2017-11-16 DIAGNOSIS — Z79899 Other long term (current) drug therapy: Secondary | ICD-10-CM | POA: Diagnosis not present

## 2017-11-16 HISTORY — PX: POLYPECTOMY: SHX5525

## 2017-11-16 HISTORY — PX: ESOPHAGOGASTRODUODENOSCOPY (EGD) WITH PROPOFOL: SHX5813

## 2017-11-16 HISTORY — DX: Cardiac murmur, unspecified: R01.1

## 2017-11-16 HISTORY — DX: Anemia, unspecified: D64.9

## 2017-11-16 SURGERY — ESOPHAGOGASTRODUODENOSCOPY (EGD) WITH PROPOFOL
Anesthesia: General

## 2017-11-16 MED ORDER — LIDOCAINE 2% (20 MG/ML) 5 ML SYRINGE
INTRAMUSCULAR | Status: DC | PRN
  Administered 2017-11-16: 80 mg via INTRAVENOUS

## 2017-11-16 MED ORDER — PROPOFOL 10 MG/ML IV BOLUS
INTRAVENOUS | Status: AC
  Filled 2017-11-16: qty 20

## 2017-11-16 MED ORDER — PROPOFOL 10 MG/ML IV BOLUS
INTRAVENOUS | Status: DC | PRN
  Administered 2017-11-16: 40 mg via INTRAVENOUS
  Administered 2017-11-16 (×2): 30 mg via INTRAVENOUS
  Administered 2017-11-16: 20 mg via INTRAVENOUS
  Administered 2017-11-16: 40 mg via INTRAVENOUS
  Administered 2017-11-16: 50 mg via INTRAVENOUS
  Administered 2017-11-16 (×5): 30 mg via INTRAVENOUS
  Administered 2017-11-16 (×3): 40 mg via INTRAVENOUS

## 2017-11-16 MED ORDER — LACTATED RINGERS IV SOLN
INTRAVENOUS | Status: DC
  Administered 2017-11-16: 1000 mL via INTRAVENOUS

## 2017-11-16 MED ORDER — PROPOFOL 10 MG/ML IV BOLUS
INTRAVENOUS | Status: AC
  Filled 2017-11-16: qty 80

## 2017-11-16 SURGICAL SUPPLY — 14 items

## 2017-11-16 NOTE — H&P (Signed)
The patient is a 67 year old male who is here today in the endoscopy unit for EGD with gastric polypectomy. He has a history of aortic valve replacement. He has a history of stroke. He has been on Coumadin.He underwent evaluation for anemia. Evaluation of the colon was unrevealing. Evaluation of the stomach showed that he had some ulcerated gastric polyps felt significant enough to be the potential source of his blood loss anemia. He has been on Coumadin. These polyps were previously not removed because it was just at diagnostic EGD with him on Coumadin. We talked at length about further evaluation and gastric polypectomy which he is agreeable to. He is having a cardiac procedure tomorrow with PFO closure. He has been off Coumadin for 4 days. He has been on a Lovenox bridge. His last dose was last night.  Physical:  No acute distress  Heart regular rhythm  Murmur heard  Lungs clear  Abdomen soft and non tender  Impression: patient with significant medical problems including anemia felt secondary to ulcerated gastric polyps. He is here today for EGD with polypectomy of the gastric polyps. He is aware of potential risks of bleeding, infection, he is also aware that being off of Coumadin runs the risk of another stroke and that is why he is on a Lovenox bridge. Both he and I and his wife have discussed all of this in detail in the past. He is also discussed this with his cardiologist.He will undergo PFO repair tomorrow by his cardiologist.

## 2017-11-16 NOTE — Op Note (Signed)
[facility] Patient Name: Tracy Marshall Procedure Date: 11/16/2017 MRN: [id number] Attending MD: Wonda Horner , MD Date of Birth: 02/16/1951 CSN: [id number] Age: 67 Admit Type: Outpatient Procedure:                Upper GI endoscopy Indications:              Iron deficiency anemia/ previously found ulcerated                            gastric polyps felt likely to be source of blood                            loss. Providers:                Wonda Horner, MD, Elmer Ramp. Tilden Dome, RN, Cherylynn Ridges, Technician Referring MD:              Medicines:                Propofol per Anesthesia Complications:            No immediate complications. Estimated blood loss:                            Minimal. Estimated Blood Loss:      Procedure:                Pre-Anesthesia Assessment:                           - Prior to the procedure, a History and Physical                            was performed, and patient medications and                            allergies were reviewed. The patient's tolerance of                            previous anesthesia was also reviewed. The risks                            and benefits of the procedure and the sedation                            options and risks were discussed with the patient.                            All questions were answered, and informed consent                            was obtained. Prior Anticoagulants: The patient has                            taken Coumadin (warfarin), last dose  was 4 days                            prior to procedure. ASA Grade Assessment: III - A                            patient with severe systemic disease. After                            reviewing the risks and benefits, the patient was                            deemed in satisfactory condition to undergo the                            procedure.                           After obtaining informed consent, the  endoscope was                            passed under direct vision. Throughout the                            procedure, the patient's blood pressure, pulse, and                            oxygen saturations were monitored continuously. The                            [id number]) scope was introduced through the                            mouth, and advanced to the second part of duodenum.                            The upper GI endoscopy was accomplished without                            difficulty. The patient tolerated the procedure                            well. Scope In: Scope Out: Findings:      The examined esophagus was normal.      A single 10 mm sessile polyp was found in the distal gastric body on the       anterior wall side. It was ulcerated and friable and bled when washed       with water. The polyp was removed with a hot snare. Resection and       retrieval were complete. Cautery site looked good.      A single 15 mm sessile polyp was found on the posterior wall of the       gastric body. The polyp was removed with a hot snare. Resection and       retrieval were complete. There was  some post polypectomy bleeding and       two hemostatic clips were successfully placed with no further bleeding       after placement of the clips.      Multiple sessile polyps were found in the gastric body. They were not       bleeding or friable and were left alone. Impression:               - Normal esophagus.                           - A single gastric polyp. Resected and retrieved.                           - A single gastric polyp. Resected and retrieved.                            Clips were placed.                           - Multiple gastric polyps. Moderate Sedation:      . Recommendation:           - Clear liquid diet.                           - Continue present medications. Procedure Code(s):        --- Professional ---                           [id number],  Esophagogastroduodenoscopy, flexible,                            transoral; with removal of tumor(s), polyp(s), or                            other lesion(s) by snare technique Diagnosis Code(s):        --- Professional ---                           K31.7, Polyp of stomach and duodenum                           D50.9, Iron deficiency anemia, unspecified CPT copyright 2017 [employer]. All rights reserved. The codes documented in this report are preliminary and upon coder review may  be revised to meet current compliance requirements. Wonda Horner, MD 11/16/2017 10:33:58 AM This report has been signed electronically. Number of Addenda: 0

## 2017-11-16 NOTE — Anesthesia Postprocedure Evaluation (Signed)
Anesthesia Post Note  Patient: Tracy Marshall.  Procedure(s) Performed: ESOPHAGOGASTRODUODENOSCOPY (EGD) WITH PROPOFOL (N/A ) POLYPECTOMY     Patient location during evaluation: Endoscopy Anesthesia Type: General Level of consciousness: awake and alert Pain management: pain level controlled Vital Signs Assessment: post-procedure vital signs reviewed and stable Respiratory status: spontaneous breathing, nonlabored ventilation, respiratory function stable and patient connected to nasal cannula oxygen Cardiovascular status: blood pressure returned to baseline and stable Postop Assessment: no apparent nausea or vomiting Anesthetic complications: no    Last Vitals:  Vitals:   11/16/17 1100 11/16/17 1110  BP: 118/75 126/69  Pulse: (!) 39 (!) 39  Resp: 13 11  Temp:    SpO2: 98% 96%    Last Pain:  Vitals:   11/16/17 1110  TempSrc:   PainSc: 0-No pain                 Staysha Truby DANIEL

## 2017-11-16 NOTE — Transfer of Care (Signed)
Immediate Anesthesia Transfer of Care Note  Patient: Tracy Marshall.  Procedure(s) Performed: ESOPHAGOGASTRODUODENOSCOPY (EGD) WITH PROPOFOL (N/A )  Patient Location: Endoscopy Unit  Anesthesia Type:MAC  Level of Consciousness: drowsy  Airway & Oxygen Therapy: Patient Spontanous Breathing and Patient connected to nasal cannula oxygen  Post-op Assessment: Report given to RN, Post -op Vital signs reviewed and stable and Patient moving all extremities  Post vital signs: Reviewed and stable  Last Vitals:  Vitals Value Taken Time  BP 100/65 11/16/2017 10:30 AM  Temp    Pulse 46 11/16/2017 10:32 AM  Resp 12 11/16/2017 10:32 AM  SpO2 99 % 11/16/2017 10:32 AM  Vitals shown include unvalidated device data.  Last Pain:  Vitals:   11/16/17 1030  TempSrc:   PainSc: 0-No pain         Complications: No apparent anesthesia complications

## 2017-11-16 NOTE — Discharge Instructions (Signed)

## 2017-11-16 NOTE — Anesthesia Preprocedure Evaluation (Addendum)
Anesthesia Evaluation  Patient identified by MRN, date of birth, ID band Patient awake    Reviewed: Allergy & Precautions, NPO status , Patient's Chart, lab work & pertinent test results  History of Anesthesia Complications Negative for: history of anesthetic complications  Airway Mallampati: II  TM Distance: >3 FB Neck ROM: Full    Dental no notable dental hx. (+) Dental Advisory Given   Pulmonary neg pulmonary ROS,    Pulmonary exam normal        Cardiovascular + Peripheral Vascular Disease  Normal cardiovascular exam+ dysrhythmias Atrial Fibrillation + Valvular Problems/Murmurs   S/P AVR   Neuro/Psych TIACVA negative psych ROS   GI/Hepatic Neg liver ROS, GERD  ,  Endo/Other  negative endocrine ROS  Renal/GU negative Renal ROS     Musculoskeletal  (+) Arthritis ,   Abdominal   Peds  Hematology   Anesthesia Other Findings   Reproductive/Obstetrics                            Anesthesia Physical Anesthesia Plan  ASA: III  Anesthesia Plan: General   Post-op Pain Management:    Induction: Intravenous  PONV Risk Score and Plan: 2 and Ondansetron and Propofol infusion  Airway Management Planned: Natural Airway  Additional Equipment:   Intra-op Plan:   Post-operative Plan:   Informed Consent: I have reviewed the patients History and Physical, chart, labs and discussed the procedure including the risks, benefits and alternatives for the proposed anesthesia with the patient or authorized representative who has indicated his/her understanding and acceptance.   Dental advisory given  Plan Discussed with: CRNA and Anesthesiologist  Anesthesia Plan Comments:        Anesthesia Quick Evaluation

## 2017-11-17 ENCOUNTER — Ambulatory Visit (HOSPITAL_COMMUNITY)
Admission: RE | Disposition: A | Discharge status: Home / Self Care | Payer: Self-pay | Source: Ambulatory Visit | Attending: Cardiovascular Disease

## 2017-11-17 ENCOUNTER — Ambulatory Visit (HOSPITAL_COMMUNITY)
Admission: RE | Admit: 2017-11-17 | Discharge: 2017-11-17 | Disposition: A | Discharge status: Home / Self Care | Payer: Medicare Other | Source: Ambulatory Visit | Attending: Cardiovascular Disease | Admitting: Cardiovascular Disease

## 2017-11-17 ENCOUNTER — Ambulatory Visit (HOSPITAL_BASED_OUTPATIENT_CLINIC_OR_DEPARTMENT_OTHER): Payer: Medicare Other

## 2017-11-17 ENCOUNTER — Encounter (HOSPITAL_COMMUNITY): Payer: Self-pay | Admitting: *Deleted

## 2017-11-17 DIAGNOSIS — I10 Essential (primary) hypertension: Secondary | ICD-10-CM | POA: Diagnosis not present

## 2017-11-17 DIAGNOSIS — I6523 Occlusion and stenosis of bilateral carotid arteries: Secondary | ICD-10-CM | POA: Insufficient documentation

## 2017-11-17 DIAGNOSIS — Q211 Atrial septal defect: Secondary | ICD-10-CM

## 2017-11-17 DIAGNOSIS — Z952 Presence of prosthetic heart valve: Secondary | ICD-10-CM | POA: Insufficient documentation

## 2017-11-17 DIAGNOSIS — E785 Hyperlipidemia, unspecified: Secondary | ICD-10-CM | POA: Diagnosis not present

## 2017-11-17 DIAGNOSIS — I714 Abdominal aortic aneurysm, without rupture: Secondary | ICD-10-CM | POA: Diagnosis not present

## 2017-11-17 DIAGNOSIS — Z7901 Long term (current) use of anticoagulants: Secondary | ICD-10-CM | POA: Diagnosis not present

## 2017-11-17 DIAGNOSIS — Z8673 Personal history of transient ischemic attack (TIA), and cerebral infarction without residual deficits: Secondary | ICD-10-CM | POA: Insufficient documentation

## 2017-11-17 DIAGNOSIS — Z7982 Long term (current) use of aspirin: Secondary | ICD-10-CM | POA: Diagnosis not present

## 2017-11-17 DIAGNOSIS — K219 Gastro-esophageal reflux disease without esophagitis: Secondary | ICD-10-CM | POA: Insufficient documentation

## 2017-11-17 DIAGNOSIS — D649 Anemia, unspecified: Secondary | ICD-10-CM | POA: Insufficient documentation

## 2017-11-17 DIAGNOSIS — Z7951 Long term (current) use of inhaled steroids: Secondary | ICD-10-CM | POA: Diagnosis not present

## 2017-11-17 DIAGNOSIS — M1990 Unspecified osteoarthritis, unspecified site: Secondary | ICD-10-CM | POA: Diagnosis not present

## 2017-11-17 DIAGNOSIS — Q2112 Patent foramen ovale: Secondary | ICD-10-CM

## 2017-11-17 DIAGNOSIS — M109 Gout, unspecified: Secondary | ICD-10-CM | POA: Diagnosis not present

## 2017-11-17 HISTORY — PX: PATENT FORAMEN OVALE(PFO) CLOSURE: CATH118300

## 2017-11-17 LAB — PROTIME-INR
INR: 1.44
Prothrombin Time: 17.4 seconds — ABNORMAL HIGH (ref 11.4–15.2)

## 2017-11-17 LAB — POCT ACTIVATED CLOTTING TIME
ACTIVATED CLOTTING TIME: 213 s
ACTIVATED CLOTTING TIME: 224 s
ACTIVATED CLOTTING TIME: 169 s

## 2017-11-17 LAB — ECHOCARDIOGRAM LIMITED
Height: 70 in
Weight: 2880 oz

## 2017-11-17 SURGERY — PATENT FORAMEN OVALE (PFO) CLOSURE
Anesthesia: LOCAL

## 2017-11-17 MED ORDER — LIDOCAINE HCL (PF) 1 % IJ SOLN
INTRAMUSCULAR | Status: AC
  Filled 2017-11-17: qty 30

## 2017-11-17 MED ORDER — FENTANYL CITRATE (PF) 100 MCG/2ML IJ SOLN
INTRAMUSCULAR | Status: AC
  Filled 2017-11-17: qty 2

## 2017-11-17 MED ORDER — SODIUM CHLORIDE 0.9 % IV SOLN: 250.0000 mL | INTRAVENOUS | Status: DC | PRN

## 2017-11-17 MED ORDER — MIDAZOLAM HCL 2 MG/2ML IJ SOLN
INTRAMUSCULAR | Status: DC | PRN
  Administered 2017-11-17: 2 mg via INTRAVENOUS

## 2017-11-17 MED ORDER — SODIUM CHLORIDE 0.9 % WEIGHT BASED INFUSION: 1.0000 mL/kg/h | INTRAVENOUS | Status: DC

## 2017-11-17 MED ORDER — HEPARIN SODIUM (PORCINE) 1000 UNIT/ML IJ SOLN
INTRAMUSCULAR | Status: AC
  Filled 2017-11-17: qty 1

## 2017-11-17 MED ORDER — HEPARIN (PORCINE) IN NACL 1000-0.9 UT/500ML-% IV SOLN
INTRAVENOUS | Status: AC
  Filled 2017-11-17: qty 1000

## 2017-11-17 MED ORDER — LIDOCAINE HCL (PF) 1 % IJ SOLN
INTRAMUSCULAR | Status: DC | PRN
  Administered 2017-11-17: 15 mL

## 2017-11-17 MED ORDER — CEFAZOLIN SODIUM-DEXTROSE 2-4 GM/100ML-% IV SOLN
INTRAVENOUS | Status: AC
  Filled 2017-11-17: qty 100

## 2017-11-17 MED ORDER — SODIUM CHLORIDE 0.9% FLUSH: 3.0000 mL | Freq: Two times a day (BID) | INTRAVENOUS | Status: DC

## 2017-11-17 MED ORDER — SODIUM CHLORIDE 0.9% FLUSH: 3.0000 mL | INTRAVENOUS | Status: DC | PRN

## 2017-11-17 MED ORDER — ACETAMINOPHEN 325 MG PO TABS: 650.0000 mg | ORAL_TABLET | ORAL | Status: DC | PRN

## 2017-11-17 MED ORDER — SODIUM CHLORIDE 0.9 % WEIGHT BASED INFUSION
3.0000 mL/kg/h | INTRAVENOUS | Status: AC
  Administered 2017-11-17: 3 mL/kg/h via INTRAVENOUS

## 2017-11-17 MED ORDER — ASPIRIN 81 MG PO CHEW
CHEWABLE_TABLET | ORAL | Status: AC
  Filled 2017-11-17: qty 1

## 2017-11-17 MED ORDER — HEPARIN (PORCINE) IN NACL 2-0.9 UNITS/ML
INTRAMUSCULAR | Status: AC | PRN
  Administered 2017-11-17 (×2): 500 mL

## 2017-11-17 MED ORDER — ONDANSETRON HCL 4 MG/2ML IJ SOLN: 4.0000 mg | Freq: Four times a day (QID) | INTRAMUSCULAR | Status: DC | PRN

## 2017-11-17 MED ORDER — FENTANYL CITRATE (PF) 100 MCG/2ML IJ SOLN
INTRAMUSCULAR | Status: DC | PRN
  Administered 2017-11-17: 25 ug via INTRAVENOUS

## 2017-11-17 MED ORDER — ASPIRIN 81 MG PO CHEW
81.0000 mg | CHEWABLE_TABLET | ORAL | Status: AC
  Administered 2017-11-17: 81 mg via ORAL

## 2017-11-17 MED ORDER — HEPARIN SODIUM (PORCINE) 1000 UNIT/ML IJ SOLN
INTRAMUSCULAR | Status: DC | PRN
  Administered 2017-11-17: 6000 [IU] via INTRAVENOUS
  Administered 2017-11-17: 1000 [IU] via INTRAVENOUS

## 2017-11-17 MED ORDER — MIDAZOLAM HCL 2 MG/2ML IJ SOLN
INTRAMUSCULAR | Status: AC
  Filled 2017-11-17: qty 2

## 2017-11-17 MED ORDER — CEFAZOLIN SODIUM-DEXTROSE 2-4 GM/100ML-% IV SOLN
2.0000 g | INTRAVENOUS | Status: AC
  Administered 2017-11-17: 2 g via INTRAVENOUS

## 2017-11-17 SURGICAL SUPPLY — 15 items
CATH ACUNAV REPROCESSED (CATHETERS) ×2 IMPLANT
CATH SUPER TORQUE PLUS 6F MPA1 (CATHETERS) ×2 IMPLANT
COVER PRB 48X5XTLSCP FOLD TPE (BAG) ×1 IMPLANT
COVER PROBE 5X48 (BAG) ×1
COVER SWIFTLINK CONNECTOR (BAG) ×2 IMPLANT
GUIDEWIRE AMPLATZER 1.5JX260 (WIRE) ×2 IMPLANT
GUIDEWIRE ANGLED .035X150CM (WIRE) ×2 IMPLANT
OCCLUDER AMPLATZER PFO 25MM (Prosthesis & Implant Heart) ×2 IMPLANT
PACK CARDIAC CATHETERIZATION (CUSTOM PROCEDURE TRAY) ×2 IMPLANT
PROTECTION STATION PRESSURIZED (MISCELLANEOUS) ×2
SHEATH AVANTI 11CM 8FR (SHEATH) ×2 IMPLANT
SHEATH INTROD W/O MIN 9FR 25CM (SHEATH) ×2 IMPLANT
STATION PROTECTION PRESSURIZED (MISCELLANEOUS) ×1 IMPLANT
SYSTEM DELIVERY AMPLATZER 8FR (SHEATH) ×2 IMPLANT
WIRE EMERALD 3MM-J .035X150CM (WIRE) ×2 IMPLANT

## 2017-11-17 NOTE — Progress Notes (Signed)
Right groin is a level zero post sheath pull. 8FR and 9FR sheaths removed without complication. Patient given instructions at this time. Dressing applied.

## 2017-11-17 NOTE — Interval H&P Note (Signed)
History and Physical Interval Note:  11/17/2017 10:09 AM  Tracy Marshall.  has presented today for surgery, with the diagnosis of PFO  The various methods of treatment have been discussed with the patient and family. After consideration of risks, benefits and other options for treatment, the patient has consented to  Procedure(s): PATENT FORAMEN OVALE (PFO) CLOSURE (N/A) as a surgical intervention .  The patient's history has been reviewed, patient examined, no change in status, stable for surgery.  I have reviewed the patient's chart and labs.  Questions were answered to the patient's satisfaction.    The patient tolerated EGD and polypectomy yesterday without problems. All questions regarding PFO closure answered.   Sherren Mocha

## 2017-11-17 NOTE — Discharge Instructions (Signed)
Groin Site Care// restart coumadin as dr cooper directed you// family to do Refer to this sheet in the next few weeks. These instructions provide you with information on caring for yourself after your procedure. Your caregiver may also give you more specific instructions. Your treatment has been planned according to current medical practices, but problems sometimes occur. Call your caregiver if you have any problems or questions after your procedure. HOME CARE INSTRUCTIONS  You may shower 24 hours after the procedure. Remove the bandage (dressing) and gently wash the site with plain soap and water. Gently pat the site dry.   Do not apply powder or lotion to the site.   Do not sit in a bathtub, swimming pool, or whirlpool for 5 to 7 days.   No bending, squatting, or lifting anything over 10 pounds (4.5 kg) as directed by your caregiver.   Inspect the site at least twice daily.   Do not drive home if you are discharged the same day of the procedure. Have someone else drive you.   You may drive 24 hours after the procedure unless otherwise instructed by your caregiver.  What to expect:  Any bruising will usually fade within 1 to 2 weeks.   Blood that collects in the tissue (hematoma) may be painful to the touch. It should usually decrease in size and tenderness within 1 to 2 weeks.  SEEK IMMEDIATE MEDICAL CARE IF:  You have unusual pain at the groin site or down the affected leg.   You have redness, warmth, swelling, or pain at the groin site.   You have drainage (other than a small amount of blood on the dressing).   You have chills.   You have a fever or persistent symptoms for more than 72 hours.   You have a fever and your symptoms suddenly get worse.   Your leg becomes pale, cool, tingly, or numb.  You have heavy bleeding from the site. Hold pressure on the site. [name]

## 2017-11-17 NOTE — Progress Notes (Signed)
  Echocardiogram 2D Echocardiogram limited post PFO closure has been performed.  Darlina Sicilian M 11/17/2017, 2:47 PM

## 2017-11-18 ENCOUNTER — Encounter (HOSPITAL_COMMUNITY): Payer: Self-pay | Admitting: Cardiovascular Disease

## 2017-11-18 MED FILL — Heparin Sod (Porcine)-NaCl IV Soln 1000 Unit/500ML-0.9%: INTRAVENOUS | Qty: 1000 | Status: AC

## 2017-11-22 ENCOUNTER — Ambulatory Visit (INDEPENDENT_AMBULATORY_CARE_PROVIDER_SITE_OTHER): Payer: Medicare Other | Admitting: *Deleted

## 2017-11-22 DIAGNOSIS — Q2112 Patent foramen ovale: Secondary | ICD-10-CM

## 2017-11-22 DIAGNOSIS — G894 Chronic pain syndrome: Secondary | ICD-10-CM | POA: Diagnosis not present

## 2017-11-22 DIAGNOSIS — Z7901 Long term (current) use of anticoagulants: Secondary | ICD-10-CM | POA: Diagnosis not present

## 2017-11-22 DIAGNOSIS — Q211 Atrial septal defect: Secondary | ICD-10-CM

## 2017-11-22 DIAGNOSIS — Z952 Presence of prosthetic heart valve: Secondary | ICD-10-CM

## 2017-11-22 LAB — POCT INR: INR: 2.2 (ref 2.0–3.0)

## 2017-11-22 NOTE — Patient Instructions (Signed)
Description   Take 2 tablets today, take 1.5 tablets tomorrow,  then continue taking 1 tablet daily except 1.5 tablets on Mondays, Wednesdays, and Fridays. Continue Lovenox 80mg  injections every 12 hours apart until your follow up appt on Friday.   Pease call coumadin clinic with any new medications or if scheduled for any procedures [phone]

## 2017-11-23 ENCOUNTER — Encounter (HOSPITAL_COMMUNITY): Payer: Self-pay | Admitting: Emergency Medicine

## 2017-11-23 ENCOUNTER — Inpatient Hospital Stay (HOSPITAL_COMMUNITY)
Admission: EM | Admit: 2017-11-23 | Discharge: 2017-11-28 | DRG: 920 | Disposition: A | Discharge status: Home / Self Care | Payer: Medicare Other | Attending: Internal Medicine | Admitting: Internal Medicine

## 2017-11-23 DIAGNOSIS — Z85820 Personal history of malignant melanoma of skin: Secondary | ICD-10-CM | POA: Diagnosis not present

## 2017-11-23 DIAGNOSIS — Z8042 Family history of malignant neoplasm of prostate: Secondary | ICD-10-CM | POA: Diagnosis not present

## 2017-11-23 DIAGNOSIS — K921 Melena: Secondary | ICD-10-CM | POA: Diagnosis not present

## 2017-11-23 DIAGNOSIS — Z8249 Family history of ischemic heart disease and other diseases of the circulatory system: Secondary | ICD-10-CM | POA: Diagnosis not present

## 2017-11-23 DIAGNOSIS — Z8774 Personal history of (corrected) congenital malformations of heart and circulatory system: Secondary | ICD-10-CM

## 2017-11-23 DIAGNOSIS — S301XXA Contusion of abdominal wall, initial encounter: Secondary | ICD-10-CM | POA: Diagnosis not present

## 2017-11-23 DIAGNOSIS — Z8041 Family history of malignant neoplasm of ovary: Secondary | ICD-10-CM

## 2017-11-23 DIAGNOSIS — I739 Peripheral vascular disease, unspecified: Secondary | ICD-10-CM | POA: Diagnosis not present

## 2017-11-23 DIAGNOSIS — E785 Hyperlipidemia, unspecified: Secondary | ICD-10-CM | POA: Diagnosis not present

## 2017-11-23 DIAGNOSIS — Q2112 Patent foramen ovale: Secondary | ICD-10-CM

## 2017-11-23 DIAGNOSIS — Z86718 Personal history of other venous thrombosis and embolism: Secondary | ICD-10-CM

## 2017-11-23 DIAGNOSIS — Z8673 Personal history of transient ischemic attack (TIA), and cerebral infarction without residual deficits: Secondary | ICD-10-CM | POA: Diagnosis not present

## 2017-11-23 DIAGNOSIS — K219 Gastro-esophageal reflux disease without esophagitis: Secondary | ICD-10-CM | POA: Diagnosis present

## 2017-11-23 DIAGNOSIS — K259 Gastric ulcer, unspecified as acute or chronic, without hemorrhage or perforation: Secondary | ICD-10-CM | POA: Diagnosis not present

## 2017-11-23 DIAGNOSIS — K317 Polyp of stomach and duodenum: Secondary | ICD-10-CM

## 2017-11-23 DIAGNOSIS — D649 Anemia, unspecified: Secondary | ICD-10-CM | POA: Diagnosis not present

## 2017-11-23 DIAGNOSIS — Z808 Family history of malignant neoplasm of other organs or systems: Secondary | ICD-10-CM

## 2017-11-23 DIAGNOSIS — D509 Iron deficiency anemia, unspecified: Secondary | ICD-10-CM | POA: Diagnosis not present

## 2017-11-23 DIAGNOSIS — R55 Syncope and collapse: Secondary | ICD-10-CM | POA: Diagnosis present

## 2017-11-23 DIAGNOSIS — F329 Major depressive disorder, single episode, unspecified: Secondary | ICD-10-CM | POA: Diagnosis present

## 2017-11-23 DIAGNOSIS — I639 Cerebral infarction, unspecified: Secondary | ICD-10-CM | POA: Diagnosis present

## 2017-11-23 DIAGNOSIS — E860 Dehydration: Secondary | ICD-10-CM | POA: Diagnosis present

## 2017-11-23 DIAGNOSIS — I959 Hypotension, unspecified: Secondary | ICD-10-CM | POA: Diagnosis not present

## 2017-11-23 DIAGNOSIS — Y838 Other surgical procedures as the cause of abnormal reaction of the patient, or of later complication, without mention of misadventure at the time of the procedure: Secondary | ICD-10-CM

## 2017-11-23 DIAGNOSIS — K91840 Postprocedural hemorrhage and hematoma of a digestive system organ or structure following a digestive system procedure: Secondary | ICD-10-CM | POA: Diagnosis present

## 2017-11-23 DIAGNOSIS — R42 Dizziness and giddiness: Secondary | ICD-10-CM | POA: Diagnosis not present

## 2017-11-23 DIAGNOSIS — K257 Chronic gastric ulcer without hemorrhage or perforation: Secondary | ICD-10-CM | POA: Diagnosis not present

## 2017-11-23 DIAGNOSIS — K922 Gastrointestinal hemorrhage, unspecified: Secondary | ICD-10-CM

## 2017-11-23 DIAGNOSIS — Z952 Presence of prosthetic heart valve: Secondary | ICD-10-CM

## 2017-11-23 DIAGNOSIS — Q211 Atrial septal defect: Secondary | ICD-10-CM | POA: Diagnosis not present

## 2017-11-23 DIAGNOSIS — Z7901 Long term (current) use of anticoagulants: Secondary | ICD-10-CM

## 2017-11-23 DIAGNOSIS — D62 Acute posthemorrhagic anemia: Secondary | ICD-10-CM | POA: Diagnosis not present

## 2017-11-23 DIAGNOSIS — I447 Left bundle-branch block, unspecified: Secondary | ICD-10-CM | POA: Diagnosis present

## 2017-11-23 DIAGNOSIS — Z7982 Long term (current) use of aspirin: Secondary | ICD-10-CM

## 2017-11-23 LAB — COMPREHENSIVE METABOLIC PANEL
ALBUMIN: 3.7 g/dL (ref 3.5–5.0)
ALT: 16 U/L — AB (ref 17–63)
AST: 17 U/L (ref 15–41)
Alkaline Phosphatase: 28 U/L — ABNORMAL LOW (ref 38–126)
Anion gap: 10 (ref 5–15)
BUN: 47 mg/dL — AB (ref 6–20)
CHLORIDE: 108 mmol/L (ref 101–111)
CO2: 21 mmol/L — AB (ref 22–32)
CREATININE: 1 mg/dL (ref 0.61–1.24)
Calcium: 9 mg/dL (ref 8.9–10.3)
GFR calc Af Amer: >60 mL/min (ref >60)
GFR calc non Af Amer: >60 mL/min (ref >60)
Glucose, Bld: 135 mg/dL — ABNORMAL HIGH (ref 65–99)
POTASSIUM: 4.4 mmol/L (ref 3.5–5.1)
SODIUM: 139 mmol/L (ref 135–145)
Total Bilirubin: 0.4 mg/dL (ref 0.3–1.2)
Total Protein: 6.4 g/dL — ABNORMAL LOW (ref 6.5–8.1)

## 2017-11-23 LAB — LIPASE, BLOOD: Lipase: 27 U/L (ref 11–51)

## 2017-11-23 LAB — PROTIME-INR
INR: 2.43
PROTHROMBIN TIME: 26.2 s — AB (ref 11.4–15.2)

## 2017-11-23 LAB — CBC WITH DIFFERENTIAL/PLATELET
ABS IMMATURE GRANULOCYTES: 0.2 10*3/uL — AB (ref 0.0–0.1)
BASOS ABS: 0.1 10*3/uL (ref 0.0–0.1)
BASOS PCT: 1 %
Eosinophils Absolute: 0 10*3/uL (ref 0.0–0.7)
Eosinophils Relative: 0 %
HCT: 34.5 % — ABNORMAL LOW (ref 39.0–52.0)
Hemoglobin: 11.3 g/dL — ABNORMAL LOW (ref 13.0–17.0)
IMMATURE GRANULOCYTES: 2 %
Lymphocytes Relative: 20 %
Lymphs Abs: 2.1 10*3/uL (ref 0.7–4.0)
MCH: 27 pg (ref 26.0–34.0)
MCHC: 32.8 g/dL (ref 30.0–36.0)
MCV: 82.5 fL (ref 78.0–100.0)
Monocytes Absolute: 0.8 10*3/uL (ref 0.1–1.0)
Monocytes Relative: 8 %
NEUTROS ABS: 7.5 10*3/uL (ref 1.7–7.7)
NEUTROS PCT: 71 %
PLATELETS: 295 10*3/uL (ref 150–400)
RBC: 4.18 MIL/uL — AB (ref 4.22–5.81)
RDW: 20.8 % — ABNORMAL HIGH (ref 11.5–15.5)
WBC: 10.6 10*3/uL — AB (ref 4.0–10.5)

## 2017-11-23 LAB — CBC
HCT: 25.9 % — ABNORMAL LOW (ref 39.0–52.0)
Hemoglobin: 8.3 g/dL — ABNORMAL LOW (ref 13.0–17.0)
MCH: 26.9 pg (ref 26.0–34.0)
MCHC: 32 g/dL (ref 30.0–36.0)
MCV: 84.1 fL (ref 78.0–100.0)
Platelets: 215 10*3/uL (ref 150–400)
RBC: 3.08 MIL/uL — ABNORMAL LOW (ref 4.22–5.81)
RDW: 21.2 % — ABNORMAL HIGH (ref 11.5–15.5)
WBC: 9.7 10*3/uL (ref 4.0–10.5)

## 2017-11-23 LAB — ABO/RH: ABO/RH(D): O POS

## 2017-11-23 MED ORDER — ONE-A-DAY MENS 50+ ADVANTAGE PO TABS: 1.0000 | ORAL_TABLET | Freq: Every day | ORAL | Status: DC

## 2017-11-23 MED ORDER — HEPARIN (PORCINE) IN NACL 100-0.45 UNIT/ML-% IJ SOLN
1000.0000 [IU]/h | INTRAMUSCULAR | Status: DC
  Administered 2017-11-23: 1000 [IU]/h via INTRAVENOUS
  Filled 2017-11-23 (×2): qty 250

## 2017-11-23 MED ORDER — PREGABALIN 100 MG PO CAPS
300.0000 mg | ORAL_CAPSULE | Freq: Two times a day (BID) | ORAL | Status: DC
  Administered 2017-11-23 – 2017-11-28 (×10): 300 mg via ORAL
  Filled 2017-11-23 (×10): qty 3

## 2017-11-23 MED ORDER — ADULT MULTIVITAMIN W/MINERALS CH
1.0000 | ORAL_TABLET | Freq: Every day | ORAL | Status: DC
  Administered 2017-11-24 – 2017-11-28 (×5): 1 via ORAL
  Filled 2017-11-23 (×5): qty 1

## 2017-11-23 MED ORDER — SODIUM CHLORIDE 0.9 % IV BOLUS
1000.0000 mL | Freq: Once | INTRAVENOUS | Status: AC
  Administered 2017-11-23: 1000 mL via INTRAVENOUS

## 2017-11-23 MED ORDER — PRAVASTATIN SODIUM 40 MG PO TABS
40.0000 mg | ORAL_TABLET | Freq: Every day | ORAL | Status: DC
  Administered 2017-11-24 – 2017-11-28 (×5): 40 mg via ORAL
  Filled 2017-11-23 (×5): qty 1

## 2017-11-23 MED ORDER — FLUTICASONE PROPIONATE 50 MCG/ACT NA SUSP
2.0000 | Freq: Every day | NASAL | Status: DC
  Filled 2017-11-23: qty 16

## 2017-11-23 MED ORDER — OXCARBAZEPINE 300 MG PO TABS
300.0000 mg | ORAL_TABLET | Freq: Two times a day (BID) | ORAL | Status: DC
  Administered 2017-11-23 – 2017-11-28 (×10): 300 mg via ORAL
  Filled 2017-11-23 (×10): qty 1

## 2017-11-23 MED ORDER — HYDROCODONE-ACETAMINOPHEN 10-325 MG PO TABS: 1.0000 | ORAL_TABLET | Freq: Every day | ORAL | Status: DC | PRN

## 2017-11-23 MED ORDER — DIAZEPAM 2 MG PO TABS
2.0000 mg | ORAL_TABLET | Freq: Every evening | ORAL | Status: DC | PRN
  Administered 2017-11-23 – 2017-11-25 (×3): 2 mg via ORAL
  Filled 2017-11-23 (×3): qty 1

## 2017-11-23 MED ORDER — ACETAMINOPHEN 500 MG PO TABS: 1000.0000 mg | ORAL_TABLET | Freq: Four times a day (QID) | ORAL | Status: DC | PRN

## 2017-11-23 MED ORDER — FENOFIBRATE 160 MG PO TABS
160.0000 mg | ORAL_TABLET | Freq: Every day | ORAL | Status: DC
  Administered 2017-11-24 – 2017-11-28 (×5): 160 mg via ORAL
  Filled 2017-11-23 (×5): qty 1

## 2017-11-23 MED ORDER — FERROUS SULFATE 325 (65 FE) MG PO TABS
325.0000 mg | ORAL_TABLET | Freq: Two times a day (BID) | ORAL | Status: DC
  Administered 2017-11-24 – 2017-11-28 (×7): 325 mg via ORAL
  Filled 2017-11-23 (×7): qty 1

## 2017-11-23 MED ORDER — PANTOPRAZOLE SODIUM 40 MG IV SOLR
8.0000 mg/h | INTRAVENOUS | Status: AC
Start: 2017-11-23 — End: 2017-11-26
  Administered 2017-11-23 – 2017-11-26 (×6): 8 mg/h via INTRAVENOUS
  Filled 2017-11-23 (×10): qty 80

## 2017-11-23 MED ORDER — SODIUM CHLORIDE 0.9 % IV SOLN
80.0000 mg | Freq: Once | INTRAVENOUS | Status: AC
  Administered 2017-11-23: 80 mg via INTRAVENOUS
  Filled 2017-11-23: qty 80

## 2017-11-23 MED ORDER — SODIUM CHLORIDE 0.9 % IV SOLN
INTRAVENOUS | Status: DC
  Administered 2017-11-23 – 2017-11-24 (×4): via INTRAVENOUS

## 2017-11-23 NOTE — Progress Notes (Addendum)
ANTICOAGULATION CONSULT NOTE - Initial Consult  Pharmacy Consult for heparin Indication: mechanical AVR, PFO closure, h/o of DVT  No Known Allergies  Patient Measurements:   Heparin Dosing Weight: 81.6 kg  Vital Signs: BP: 105/69 (06/04 1930) Pulse Rate: 86 (06/04 1930)  Labs: Recent Labs    11/22/17 1352 11/23/17 1236  HGB  --  11.3*  HCT  --  34.5*  PLT  --  295  LABPROT  --  26.2*  INR 2.2 2.43  CREATININE  --  1.00    Estimated Creatinine Clearance: 76 mL/min (by C-G formula based on SCr of 1 mg/dL).   Medical History: Past Medical History:  Diagnosis Date  . Anemia    iron  . Arthritis   . Atypical moles    melanomna  . Basal cell carcinoma   . Bilateral carotid artery stenosis 08/03/2014   1-39% right and 40-59% left carotid artery stenosis  . Chronic anticoagulation    for mechanical AVR  . Cluster headaches   . CVA (cerebral vascular accident) ([place]) 07/24/2014    MRI indicating 2 punctate foci of acute infarction.  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   . Diverticulosis   . Dizziness   . Episodic recurrent vertigo 2002 & 2005   carotid dopplers w no clinically significant stenosis  . Fatty liver    hx of elevated hepatic transaminases-negative workup in 2009 except for U/S suggesting fatty liver  . GERD (gastroesophageal reflux disease)   . Gout   . Heart murmur   . Hyperlipidemia   . Hyperlipidemia LDL goal <70 12/05/2015  . Joint pain   . LBBB (left bundle branch block)   . Melanoma ([place])    hx of melanoma and multiple basal cell carcinomas Dr Tonia Brooms  . PFO (patent foramen ovale) 12/05/2015  . Positive ANA (antinuclear antibody)   . S/P AVR (aortic valve replacement)    mechanical  . TIA (transient ischemic attack)     Medications:  Infusions:  . sodium chloride 125 mL/hr at 11/23/17 1411  . heparin    . pantoprozole (PROTONIX) infusion 8 mg/hr (11/23/17 1437)     Assessment: Patient admitted for near syncope and melena. Patient has a mechanical aorta valve placed in 2001 and is on PTA Coumadin. Also has a history of DVT. Had recent PFO closure surgery on 5/29 and was placed on on Lovenox bridge back to warfarin. Last dose of Warfarin on 6/3 and Lovenox on 6/4. Both have been discontinued.  Hgb 11.3, plts wnl.    Goal of Therapy:  Heparin 0.3-0.5 Monitor platelets by anticoagulation protocol: Yes   Plan:  No heparin bolus Heparin 1000 units/hr Heparin level in 6 hours Daily CBC and heparin level Aim for lower end of the goal given GI bleed Monitor closely for worsening bleeding  Leroy Libman, PharmD Pharmacy Resident Phone [phone]

## 2017-11-23 NOTE — ED Notes (Signed)
Lying B/P 102/71  P.75 Sitting  B/P  97/61  P.88 Standing  B/P  93/70  P.95

## 2017-11-23 NOTE — ED Notes (Signed)
Pt straight back to [facility] due to pale, diaphoretic and hypotensive. Pt had Endo procedure and cardiac procedure 1 week ago. Black stools started this morning.

## 2017-11-23 NOTE — H&P (Addendum)
History and Physical    Tracy Marshall. UDJ:[id number] DOB: Nov 04, 1950 DOA: 11/23/2017  PCP: Orpah Melter, MD Consultants:  Gastroenterology Patient coming from:  Home - lives with wife ; NOK: wife  Chief Complaint: Near syncope, melena  HPI: Tracy Noguez. is a 67 y.o. male with medical history significant for CVA, DVT, recent PFO closure on coumadin and lovenox bridge, h/o gastric polyps s/p removal last week, coming in with c/o black tarry stools and near syncope. Pt underwent PFO closure here last week on 5/29 and gastric polypectomy the day prior 5/28 with Dr. Penelope Coop. He has been on coumadin for several years, is s/p mechanical aortic valve replacement in 2001. He had held coumadin since 5/25 per instructions and it was resumed the 5/28 along with a lovenox bridge. His last dose of coumadin was last night, 6/3. He states he felt lightheaded yesterday 6/3 along with cold chills alternating with hot sweats; symptoms worsened over the last 24h and developed dark tarry stools today x 2. Reports stools are soft, but no diarrhea. He has been compliant with iron therapy for the last month or so but has not had black stools until today. He developed dyspnea along with worsening lightheadedness and so came to the ED. Denies chest pain. He does have extensive bruising, including his groin where the PFO was accessed but no other s/s bleeding. Denies nausea. Reports his abdomen is sore mainly in the lower quadrants, no LUQ or RUQ or epigastric pain.  ED Course: Initial BP was 75/50 with HR 60. Hemoglobin was 11.3 and BUN was 47. He was given IVF with improvement of his BP to 109/87. GI was consulted and saw pt in the ED.  Review of Systems: As per HPI; otherwise review of systems reviewed and negative.  Ambulatory Status:  Ambulates without assistance  Past Medical History:  Diagnosis Date  . Anemia    iron  . Arthritis   . Atypical moles    melanomna  . Basal cell carcinoma   . Bilateral  carotid artery stenosis 08/03/2014   1-39% right and 40-59% left carotid artery stenosis  . Chronic anticoagulation    for mechanical AVR  . Cluster headaches   . CVA (cerebral vascular accident) ([place]) 07/24/2014    MRI indicating 2 punctate foci of acute infarction.  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   . Diverticulosis   . Dizziness   . Episodic recurrent vertigo 2002 & 2005   carotid dopplers w no clinically significant stenosis  . Fatty liver    hx of elevated hepatic transaminases-negative workup in 2009 except for U/S suggesting fatty liver  . GERD (gastroesophageal reflux disease)   . Gout   . Heart murmur   . Hyperlipidemia   . Hyperlipidemia LDL goal <70 12/05/2015  . Joint pain   . LBBB (left bundle branch block)   . Melanoma ([place])    hx of melanoma and multiple basal cell carcinomas Dr Tonia Brooms  . PFO (patent foramen ovale) 12/05/2015  . Positive ANA (antinuclear antibody)   . S/P AVR (aortic valve replacement)    mechanical  . TIA (transient ischemic attack)     Past Surgical History:  Procedure Laterality Date  . APPENDECTOMY    . CARDIAC VALVE REPLACEMENT     mechanical  . CHOLECYSTECTOMY    . DENTAL SURGERY Left bone graft and extraction  . ESOPHAGOGASTRODUODENOSCOPY (EGD) WITH PROPOFOL N/A 11/16/2017   Procedure: ESOPHAGOGASTRODUODENOSCOPY (  EGD) WITH PROPOFOL;  Surgeon: Wonda Horner, MD;  Location: [facility] ENDOSCOPY;  Service: Endoscopy;  Laterality: N/A;  . MELANOMA EXCISION     x3  . PATENT FORAMEN OVALE(PFO) CLOSURE N/A 11/17/2017   Procedure: PATENT FORAMEN OVALE (PFO) CLOSURE;  Surgeon: Sherren Mocha, MD;  Location: [facility];  Service: Cardiovascular;  Laterality: N/A;  . POLYPECTOMY  11/16/2017   Procedure: POLYPECTOMY;  Surgeon: Wonda Horner, MD;  Location: [facility] ENDOSCOPY;  Service: Endoscopy;;  . TEE WITHOUT CARDIOVERSION N/A 07/31/2014   Procedure: TRANSESOPHAGEAL ECHOCARDIOGRAM  (TEE);  Surgeon: Sueanne Margarita, MD;  Location: [facility] ENDOSCOPY;  Service: Cardiovascular;  Laterality: N/A;  . TEE WITHOUT CARDIOVERSION N/A 10/01/2014   Procedure: TRANSESOPHAGEAL ECHOCARDIOGRAM (TEE);  Surgeon: Lelon Perla, MD;  Location: [facility] ENDOSCOPY;  Service: Cardiovascular;  Laterality: N/A;    Social History   Socioeconomic History  . Marital status: Married    Spouse name: Not on file  . Number of children: Not on file  . Years of education: Not on file  . Highest education level: Not on file  Occupational History  . Not on file  Social Needs  . Financial resource strain: Not on file  . Food insecurity:    Worry: Not on file    Inability: Not on file  . Transportation needs:    Medical: Not on file    Non-medical: Not on file  Tobacco Use  . Smoking status: Never Smoker  . Smokeless tobacco: Never Used  Substance and Sexual Activity  . Alcohol use: Yes    Alcohol/week: 0.0 oz    Comment: moderate - 2-3 drinks about 3 times per week of wine, liquor, beer  . Drug use: No  . Sexual activity: Not Currently  Lifestyle  . Physical activity:    Days per week: Not on file    Minutes per session: Not on file  . Stress: Not on file  Relationships  . Social connections:    Talks on phone: Not on file    Gets together: Not on file    Attends religious service: Not on file    Active member of club or organization: Not on file    Attends meetings of clubs or organizations: Not on file    Relationship status: Not on file  . Intimate partner violence:    Fear of current or ex partner: Not on file    Emotionally abused: Not on file    Physically abused: Not on file    Forced sexual activity: Not on file  Other Topics Concern  . Not on file  Social History Narrative   Lives with wife in a one story home.  No children.     Retired from [employer].    No Known Allergies  Family History  Problem Relation Age of Onset  . Cancer Mother        melanoma, ovarian    . Cancer Father        prostate  . Melanoma Unknown   . Psoriasis Unknown   . Valvular heart disease Brother   . Scoliosis Sister     Prior to Admission medications   Medication Sig Start Date End Date Taking? Authorizing Provider  acetaminophen (TYLENOL) 500 MG tablet Take 1,000 mg by mouth every 6 (six) hours as needed for moderate pain.    [provider]  Ascorbic Acid (VITAMIN C PO) Take 1 tablet by mouth daily.     [provider]  aspirin 81 MG tablet Take 81 mg by mouth daily.    [provider]  colchicine 0.6 MG tablet Take 0.6 mg by mouth daily as needed (gout flares).     [provider]  diazepam (VALIUM) 2 MG tablet Take 2 mg by mouth at bedtime as needed (for sleep).  11/03/15   [provider]  enoxaparin (LOVENOX) 80 MG/0.8ML injection Inject 0.8 mLs (80 mg total) into the skin every 12 (twelve) hours. 11/11/17   Sherren Mocha, MD  fenofibrate (TRICOR) 145 MG tablet Take 1 tablet (145 mg total) by mouth daily. 01/06/16   Sueanne Margarita, MD  ferrous sulfate 325 (65 FE) MG tablet Take 325 mg by mouth 2 (two) times daily with a meal.    [provider]  fluticasone (FLONASE) 50 MCG/ACT nasal spray Place 2 sprays into both nostrils daily.    [provider]  HYDROcodone-acetaminophen (NORCO) 10-325 MG tablet Take 1 tablet by mouth daily as needed (for back pain).  07/01/16   [provider]  hydrocortisone cream 1 % Apply 1 application topically 2 (two) times daily.    [provider]  lubiprostone (AMITIZA) 24 MCG capsule Take 24 mcg by mouth 2 (two) times daily with a meal.    [provider]  Magnesium 400 MG TABS Take 400 mg by mouth daily.    [provider]  methocarbamol (ROBAXIN) 500 MG tablet Take 500 mg by mouth daily as needed for muscle spasms.  11/17/15   [provider]  Multiple Vitamins-Minerals (ONE-A-DAY MENS 50+ ADVANTAGE) TABS Take 1 tablet by mouth  daily.    [provider]  OXcarbazepine (TRILEPTAL) 150 MG tablet Take 300 mg by mouth 2 (two) times daily.     [provider]  pantoprazole (PROTONIX) 40 MG tablet Take 40 mg by mouth daily. 01/26/11   [provider]  pravastatin (PRAVACHOL) 40 MG tablet Take 1 tablet (40 mg total) by mouth daily. 01/21/15   Patel, Arvin Collard K, DO  pregabalin (LYRICA) 150 MG capsule Take 300 mg by mouth 2 (two) times daily.    [provider]  ranitidine (ZANTAC) 75 MG tablet Take 75 mg by mouth daily as needed for heartburn.     [provider]  warfarin (COUMADIN) 3 MG tablet TAKE AS DIRECTED BY COUMADIN CLINIC Patient taking differently: Take 3 mg by mouth daily on Sunday, Tuesday, Thursday and Saturday. Take 1.5 mg by mouth daily on Monday, Wednesday and Friday 06/02/17   Sueanne Margarita, MD    Physical Exam: Vitals:   11/23/17 1415 11/23/17 1445 11/23/17 1500 11/23/17 1515  BP: 102/71 106/76 102/76 107/70  Pulse: 82 83 79 79  Resp: 19 15 16 12   SpO2: 100% 99% 100% 100%     . General: Pale appearing male, Appears calm and comfortable and is NAD . Eyes: Pale conjunctiva, PERRL, EOMI, normal lids, iris . ENT:  grossly normal hearing, lips & tongue, mmm; appropriate dentition . Neck:  no LAD, masses or thyromegaly; no carotid bruits . Cardiovascular:  RRR, II/VI systolic M with mechanical click. No LE edema.  [name] Respiratory:   CTA bilaterally with no wheezes/rales/rhonchi.  Normal respiratory effort. . Abdomen:  soft, ND, NABS, mildly TTP lower quadrants where ecchymoses are . Back:   normal alignment . Skin: diffuse ecchymoses, predominantly in R groin, lower abdomen; no rash or induration seen on limited exam . Musculoskeletal:  grossly normal tone BUE/BLE, good ROM, no bony abnormality .  Lower extremity:  No LE edema.  Limited foot exam with no ulcerations.  2+ distal pulses. [name] Psychiatric:  grossly normal mood and affect, speech fluent and appropriate,  AOx3 . Neurologic:  CN 2-12 grossly intact, moves all extremities in coordinated fashion, sensation intact    Radiological Exams on Admission: No results found.  EKG: Independently reviewed.  NSR with rate 74; no evidence of acute ischemia LBBB No sig change from prior  Labs on Admission: I have personally reviewed the available labs and imaging studies at the time of the admission.  Pertinent labs:  Hgb 11.3 (was 14.9 on 5/23, was as low as 7.2 on 2/4) MCV 82.5 WBC 10.6 Creat 1.00 (baseline)  EGD 5/28: Multiple polpys seen; 2 polyps were removed with hot snare. 2 hemoclips placed on polypectomy site for largest polyp on posterior wall of gastric body. No postpolypectomy bleeding was seen.  Virtual Colonoscopy 09/10/07 -Sigmoid/descending colon is mildly underdistended on supine imaging, although this is improved on prone imaging. -Moderate layering fluid/contrast in the colon, although this shifts position on supine/prone imaging. -No significant colonic polyp, mass, apple core lesion, or stricture. -Scattered sigmoid diverticulosis, without evidence of diverticulitis. -No evidence of bowel obstruction. CT ABDOMEN AND PELVIS WITHOUT CONTRAST No significant colonic polypoid lesion, mass, or stricture. Sigmoid diverticulosis, without evidence of diverticulitis.   Assessment/Plan Principal Problem:   Postural dizziness with presyncope Active Problems:   Long term (current) use of anticoagulants   S/P AVR (aortic valve replacement)   CVA (cerebral vascular accident) ([place])   PFO (patent foramen ovale)   Hyperlipidemia LDL goal <70   Melena   Multiple gastric polyps   Presyncope with hypotension, dizziness: symptoms, signs and timing consistent with post-polypectomy GIB given anticoagulation with lovenox and coumadin and recent procedure. -GI consulted, plan scope tomorrow, appreciate assistance -NPO after MN -clear liquid diet until then -q8h CBC, transfuse if Hgb  <7 or hemodynamically unstable -typed and screened -continue protonix gtt -hold coumadin and lovenox, hold ASA for now; heparin gtt ordered -monitor for further melena -suspect his degree of anemia is actually worse than his Hgb reflects as pt appears dehydrated on exam; f/u serial Hgb -cont IVF for now, encourage po fluids until NPO  Gastric Polyps: pathology showed hyperplastic polyps, no evidence of malignancy -f/u with GI per their recs  S/p PFO closure -resume anticoagulation when appropriate; pending endoscopy results  Mechanical AVR 2001 -problem list updated with pt's card -holding coumadin and lovenox, initate heparin gtt, hold for endoscopy tomorrow a.m.  H/o CVA -cont statin, tricor  -holding baby ASA for presumed GIB -heparin gtt for temporary anticoag as above  H/o DVT -heparin gtt as above; resume lovenox/coumadin when appropriate and pending endoscopy results / GI recs  Hyperlipidemia -cont statin, tricor   DVT prophylaxis: SCDs, heparin gtt Code Status:  Full - confirmed with patient/family Family Communication: Wife   Makell, Cyr [phone]  [phone]  Disposition Plan:  Home once clinically improved, 1-2 days Consults called: GI  Admission status: Inpatient, telemetry   Admit - It is my clinical opinion that admission to INPATIENT is reasonable and necessary because of the expectation that this patient will require hospital care that crosses at least 2 midnights to treat this condition based on the medical complexity of the problems presented.  Given the aforementioned information, the predictability of an adverse outcome is felt to be significant.  Tiffany Kocher MD [facility]  If note is complete, please contact covering daytime or nighttime physician. [email or web address]  Password [email or web address]  11/23/2017, 4:04 PM

## 2017-11-23 NOTE — Consult Note (Signed)
Referring Provider: Dr. Vanita Panda Primary Care Physician:  Orpah Melter, MD Primary Gastroenterologist:  Dr. Penelope Coop  Reason for Consultation:  GI bleed  HPI: Tracy Marshall. is a 67 y.o. male with multiple medical problems as stated below including a recent PFO closure and aortic valve replacement on Coumadin and Lovenox bridging, history of CVA who had an EGD on 11/16/17 by Dr. Penelope Coop for iron deficiency anemia who removed 2 gastric polyps with hot snare. Two hemoclips were placed on the polypectomy site for the largest polyp on the posterior wall of the gastric body. No postpolypectomy bleeding was seen on the site of the distal anterior wall polypectomy site. Both polyps were noted to be hyperplastic. He had a black stool X 1 today (denies more than one black stool) and reports no other black stools since his EGD last week. Felt lightheaded with near syncope and found to be hypotensive (75/50) on arrival that responded to IVFs. Felt nauseous at home but denies vomiting. Denies NSAIDs. Hgb 11.3 today (14.9 on 11/11/17). INR 2.43 on Coumadin.  Past Medical History:  Diagnosis Date  . Anemia    iron  . Arthritis   . Atypical moles    melanomna  . Basal cell carcinoma   . Bilateral carotid artery stenosis 08/03/2014   1-39% right and 40-59% left carotid artery stenosis  . Chronic anticoagulation    for mechanical AVR  . Cluster headaches   . CVA (cerebral vascular accident) ([place]) 07/24/2014    MRI indicating 2 punctate foci of acute infarction.  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   . Diverticulosis   . Dizziness   . Episodic recurrent vertigo 2002 & 2005   carotid dopplers w no clinically significant stenosis  . Fatty liver    hx of elevated hepatic transaminases-negative workup in 2009 except for U/S suggesting fatty liver  . GERD (gastroesophageal reflux disease)   . Gout   . Heart murmur   . Hyperlipidemia   .  Hyperlipidemia LDL goal <70 12/05/2015  . Joint pain   . LBBB (left bundle branch block)   . Melanoma ([place])    hx of melanoma and multiple basal cell carcinomas Dr Tonia Brooms  . PFO (patent foramen ovale) 12/05/2015  . Positive ANA (antinuclear antibody)   . S/P AVR (aortic valve replacement)    mechanical  . TIA (transient ischemic attack)     Past Surgical History:  Procedure Laterality Date  . APPENDECTOMY    . CARDIAC VALVE REPLACEMENT     mechanical  . CHOLECYSTECTOMY    . DENTAL SURGERY Left bone graft and extraction  . ESOPHAGOGASTRODUODENOSCOPY (EGD) WITH PROPOFOL N/A 11/16/2017   Procedure: ESOPHAGOGASTRODUODENOSCOPY (EGD) WITH PROPOFOL;  Surgeon: Wonda Horner, MD;  Location: [facility];  Service: Endoscopy;  Laterality: N/A;  . MELANOMA EXCISION     x3  . PATENT FORAMEN OVALE(PFO) CLOSURE N/A 11/17/2017   Procedure: PATENT FORAMEN OVALE (PFO) CLOSURE;  Surgeon: Sherren Mocha, MD;  Location: [facility];  Service: Cardiovascular;  Laterality: N/A;  . POLYPECTOMY  11/16/2017   Procedure: POLYPECTOMY;  Surgeon: Wonda Horner, MD;  Location: [facility];  Service: Endoscopy;;  . TEE WITHOUT CARDIOVERSION N/A 07/31/2014   Procedure: TRANSESOPHAGEAL ECHOCARDIOGRAM (TEE);  Surgeon: Sueanne Margarita, MD;  Location: [facility] ENDOSCOPY;  Service: Cardiovascular;  Laterality: N/A;  . TEE WITHOUT CARDIOVERSION N/A 10/01/2014   Procedure: TRANSESOPHAGEAL ECHOCARDIOGRAM (TEE);  Surgeon: Lelon Perla, MD;  Location:  [facility] ENDOSCOPY;  Service: Cardiovascular;  Laterality: N/A;    Prior to Admission medications   Medication Sig Start Date End Date Taking? Authorizing Provider  acetaminophen (TYLENOL) 500 MG tablet Take 1,000 mg by mouth every 6 (six) hours as needed for moderate pain.    [provider]  Ascorbic Acid (VITAMIN C PO) Take 1 tablet by mouth daily.     [provider]  aspirin 81 MG tablet Take 81 mg by mouth daily.    [provider]  colchicine  0.6 MG tablet Take 0.6 mg by mouth daily as needed (gout flares).     [provider]  diazepam (VALIUM) 2 MG tablet Take 2 mg by mouth at bedtime as needed (for sleep).  11/03/15   [provider]  enoxaparin (LOVENOX) 80 MG/0.8ML injection Inject 0.8 mLs (80 mg total) into the skin every 12 (twelve) hours. 11/11/17   Sherren Mocha, MD  fenofibrate (TRICOR) 145 MG tablet Take 1 tablet (145 mg total) by mouth daily. 01/06/16   Sueanne Margarita, MD  ferrous sulfate 325 (65 FE) MG tablet Take 325 mg by mouth 2 (two) times daily with a meal.    [provider]  fluticasone (FLONASE) 50 MCG/ACT nasal spray Place 2 sprays into both nostrils daily.    [provider]  HYDROcodone-acetaminophen (NORCO) 10-325 MG tablet Take 1 tablet by mouth daily as needed (for back pain).  07/01/16   [provider]  hydrocortisone cream 1 % Apply 1 application topically 2 (two) times daily.    [provider]  lubiprostone (AMITIZA) 24 MCG capsule Take 24 mcg by mouth 2 (two) times daily with a meal.    [provider]  Magnesium 400 MG TABS Take 400 mg by mouth daily.    [provider]  methocarbamol (ROBAXIN) 500 MG tablet Take 500 mg by mouth daily as needed for muscle spasms.  11/17/15   [provider]  Multiple Vitamins-Minerals (ONE-A-DAY MENS 50+ ADVANTAGE) TABS Take 1 tablet by mouth daily.    [provider]  OXcarbazepine (TRILEPTAL) 150 MG tablet Take 300 mg by mouth 2 (two) times daily.     [provider]  pantoprazole (PROTONIX) 40 MG tablet Take 40 mg by mouth daily. 01/26/11   [provider]  pravastatin (PRAVACHOL) 40 MG tablet Take 1 tablet (40 mg total) by mouth daily. 01/21/15   Patel, Arvin Collard K, DO  pregabalin (LYRICA) 150 MG capsule Take 300 mg by mouth 2 (two) times daily.    [provider]  ranitidine (ZANTAC) 75 MG tablet Take 75 mg by mouth daily as needed for heartburn.     [provider]  warfarin (COUMADIN) 3 MG tablet TAKE AS DIRECTED BY COUMADIN CLINIC Patient taking differently: Take 3 mg by mouth daily on Sunday, Tuesday, Thursday and Saturday. Take 1.5 mg by mouth daily on Monday, Wednesday and Friday 06/02/17   Sueanne Margarita, MD    Scheduled Meds: Continuous Infusions: . sodium chloride 125 mL/hr at 11/23/17 1411  . pantoprozole (PROTONIX) infusion 8 mg/hr (11/23/17 1437)  . sodium chloride 1,000 mL (11/23/17 1436)   PRN Meds:.  Allergies as of 11/23/2017  . (No Known Allergies)    Family History  Problem Relation Age of Onset  . Cancer Mother        melanoma, ovarian  . Cancer Father        prostate  . Melanoma Unknown   . Psoriasis Unknown   .  Valvular heart disease Brother   . Scoliosis Sister     Social History   Socioeconomic History  . Marital status: Married    Spouse name: Not on file  . Number of children: Not on file  . Years of education: Not on file  . Highest education level: Not on file  Occupational History  . Not on file  Social Needs  . Financial resource strain: Not on file  . Food insecurity:    Worry: Not on file    Inability: Not on file  . Transportation needs:    Medical: Not on file    Non-medical: Not on file  Tobacco Use  . Smoking status: Never Smoker  . Smokeless tobacco: Never Used  Substance and Sexual Activity  . Alcohol use: Yes    Alcohol/week: 0.0 oz    Comment: moderate - 2-3 drinks about 3 times per week of wine, liquor, beer  . Drug use: No  . Sexual activity: Not Currently  Lifestyle  . Physical activity:    Days per week: Not on file    Minutes per session: Not on file  . Stress: Not on file  Relationships  . Social connections:    Talks on phone: Not on file    Gets together: Not on file    Attends religious service: Not on file    Active member of club or organization: Not on file    Attends meetings of clubs or organizations: Not on file    Relationship status: Not on  file  . Intimate partner violence:    Fear of current or ex partner: Not on file    Emotionally abused: Not on file    Physically abused: Not on file    Forced sexual activity: Not on file  Other Topics Concern  . Not on file  Social History Narrative   Lives with wife in a one story home.  No children.     Retired from [employer].    Review of Systems: All negative except as stated above in HPI.  Physical Exam: Vital signs: Vitals:   11/23/17 1345 11/23/17 1415  BP: 100/70 102/71  Pulse: 79 82  Resp: 13 19  SpO2: 99% 100%     General:  Lethargic, pale, Well-developed, well-nourished, pleasant and cooperative in NAD Head: normocephalic, atraumatic Eyes: anicteric sclera ENT: oropharynx clear Neck: supple, nontender Lungs:  Clear throughout to auscultation.   No wheezes, crackles, or rhonchi. No acute distress. Heart:  Regular rate and rhythm; no murmurs, clicks, rubs,  or gallops. Abdomen: lower abdominal tenderness without guarding, soft, nondistended, +BS, focal areas of ecchymosis in lower abdomen  Rectal:  Deferred Ext: no edema  GI:  Lab Results: Recent Labs    11/23/17 1236  WBC 10.6*  HGB 11.3*  HCT 34.5*  PLT 295   BMET Recent Labs    11/23/17 1236  NA 139  K 4.4  CL 108  CO2 21*  GLUCOSE 135*  BUN 47*  CREATININE 1.00  CALCIUM 9.0   LFT Recent Labs    11/23/17 1236  PROT 6.4*  ALBUMIN 3.7  AST 17  ALT 16*  ALKPHOS 28*  BILITOT 0.4   PT/INR Recent Labs    11/22/17 1352 11/23/17 1236  LABPROT  --  26.2*  INR 2.2 2.43     Studies/Results: No results found.  Impression/Plan: 67 yo with recent gastric polyp resection (11/16/17) presents with an upper GI bleed likely from one of those polypectomy  sites. Responded to volume resuscitation. Protonix drip. He is high risk for anesthesia for a repeat EGD but if he is showing signs of ongoing bleeding then will need to do a repeat EGD tomorrow with anesthesia. Clear liquid diet  this evening and NPO p MN. Follow H/Hs closely. Hold Coumadin and use IV Heparin if needed but defer to primary team dosing. Will f/u tomorrow.    LOS: 0 days   [place].  11/23/2017, 2:37 PM  Questions please call [phone]

## 2017-11-23 NOTE — ED Provider Notes (Signed)
[facility] EMERGENCY DEPARTMENT Provider Note   CSN: [id number] Arrival date & time: 11/23/17  1202     History   Chief Complaint Chief Complaint  Patient presents with  . GI Bleeding    HPI Tracy Marshall. is a 67 y.o. male.  HPI Patient presents with lightheadedness, concern for GI bleed. Patient was recently discharged from this facility after procedures including aortic valve repair, PFO closure, and endoscopy. He notes that he has been doing generally well at home, using Lovenox, transitioning to Coumadin. However commented today the patient had multiple episodes of black stool, and has had worsening lightheadedness, with near syncope, including an episode of near complete loss of consciousness prior to ED arrival. There is associated diaphoresis, generalized discomfort, but no focal pain including no chest pain. He is here with his wife who assists with the HPI. Beyond the change in anticoagulant, no other recent medication change, diet change.  Past Medical History:  Diagnosis Date  . Anemia    iron  . Arthritis   . Atypical moles    melanomna  . Basal cell carcinoma   . Bilateral carotid artery stenosis 08/03/2014   1-39% right and 40-59% left carotid artery stenosis  . Chronic anticoagulation    for mechanical AVR  . Cluster headaches   . CVA (cerebral vascular accident) ([place]) 07/24/2014    MRI indicating 2 punctate foci of acute infarction.  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   . Diverticulosis   . Dizziness   . Episodic recurrent vertigo 2002 & 2005   carotid dopplers w no clinically significant stenosis  . Fatty liver    hx of elevated hepatic transaminases-negative workup in 2009 except for U/S suggesting fatty liver  . GERD (gastroesophageal reflux disease)   . Gout   . Heart murmur   . Hyperlipidemia   . Hyperlipidemia LDL goal <70 12/05/2015  . Joint pain   .  LBBB (left bundle branch block)   . Melanoma ([place])    hx of melanoma and multiple basal cell carcinomas Dr Tonia Brooms  . PFO (patent foramen ovale) 12/05/2015  . Positive ANA (antinuclear antibody)   . S/P AVR (aortic valve replacement)    mechanical  . TIA (transient ischemic attack)     Patient Active Problem List   Diagnosis Date Noted  . PFO (patent foramen ovale) 12/05/2015  . Hyperlipidemia LDL goal <70 12/05/2015  . Elevated BP 12/05/2015  . Thoracic aortic aneurysm without rupture ([place]) 11/27/2014  . Bilateral carotid artery stenosis 08/03/2014  . CVA (cerebral vascular accident) ([place]) 07/24/2014  . LBBB (left bundle branch block)   . S/P AVR (aortic valve replacement)   . Atypical chest pain 06/01/2013  . Aortic valve disorder 06/01/2013  . Long term (current) use of anticoagulants 06/01/2013  . Postop check 04/28/2011  . 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 Melanoma right anterolateral thigh 02/10/2011    Past Surgical History:  Procedure Laterality Date  . APPENDECTOMY    . CARDIAC VALVE REPLACEMENT     mechanical  . CHOLECYSTECTOMY    . DENTAL SURGERY Left bone graft and extraction  . ESOPHAGOGASTRODUODENOSCOPY (EGD) WITH PROPOFOL N/A 11/16/2017   Procedure: ESOPHAGOGASTRODUODENOSCOPY (EGD) WITH PROPOFOL;  Surgeon: Wonda Horner, MD;  Location: [facility];  Service: Endoscopy;  Laterality: N/A;  . MELANOMA EXCISION     x3  . PATENT FORAMEN OVALE(PFO) CLOSURE N/A 11/17/2017   Procedure: PATENT  FORAMEN OVALE (PFO) CLOSURE;  Surgeon: Sherren Mocha, MD;  Location: [place] [facility];  Service: Cardiovascular;  Laterality: N/A;  . POLYPECTOMY  11/16/2017   Procedure: POLYPECTOMY;  Surgeon: Wonda Horner, MD;  Location: [facility];  Service: Endoscopy;;  . TEE WITHOUT CARDIOVERSION N/A 07/31/2014   Procedure: TRANSESOPHAGEAL ECHOCARDIOGRAM (TEE);  Surgeon: Sueanne Margarita, MD;  Location: [facility] ENDOSCOPY;  Service: Cardiovascular;  Laterality: N/A;  . TEE  WITHOUT CARDIOVERSION N/A 10/01/2014   Procedure: TRANSESOPHAGEAL ECHOCARDIOGRAM (TEE);  Surgeon: Lelon Perla, MD;  Location: [facility] ENDOSCOPY;  Service: Cardiovascular;  Laterality: N/A;        Home Medications    Prior to Admission medications   Medication Sig Start Date End Date Taking? Authorizing Provider  acetaminophen (TYLENOL) 500 MG tablet Take 1,000 mg by mouth every 6 (six) hours as needed for moderate pain.    [provider]  Ascorbic Acid (VITAMIN C PO) Take 1 tablet by mouth daily.     [provider]  aspirin 81 MG tablet Take 81 mg by mouth daily.    [provider]  colchicine 0.6 MG tablet Take 0.6 mg by mouth daily as needed (gout flares).     [provider]  diazepam (VALIUM) 2 MG tablet Take 2 mg by mouth at bedtime as needed (for sleep).  11/03/15   [provider]  enoxaparin (LOVENOX) 80 MG/0.8ML injection Inject 0.8 mLs (80 mg total) into the skin every 12 (twelve) hours. 11/11/17   Sherren Mocha, MD  fenofibrate (TRICOR) 145 MG tablet Take 1 tablet (145 mg total) by mouth daily. 01/06/16   Sueanne Margarita, MD  ferrous sulfate 325 (65 FE) MG tablet Take 325 mg by mouth 2 (two) times daily with a meal.    [provider]  fluticasone (FLONASE) 50 MCG/ACT nasal spray Place 2 sprays into both nostrils daily.    [provider]  HYDROcodone-acetaminophen (NORCO) 10-325 MG tablet Take 1 tablet by mouth daily as needed (for back pain).  07/01/16   [provider]  hydrocortisone cream 1 % Apply 1 application topically 2 (two) times daily.    [provider]  lubiprostone (AMITIZA) 24 MCG capsule Take 24 mcg by mouth 2 (two) times daily with a meal.    [provider]  Magnesium 400 MG TABS Take 400 mg by mouth daily.    [provider]  methocarbamol (ROBAXIN) 500 MG tablet Take 500 mg by mouth daily as needed for muscle spasms.  11/17/15   [provider]  Multiple  Vitamins-Minerals (ONE-A-DAY MENS 50+ ADVANTAGE) TABS Take 1 tablet by mouth daily.    [provider]  OXcarbazepine (TRILEPTAL) 150 MG tablet Take 300 mg by mouth 2 (two) times daily.     [provider]  pantoprazole (PROTONIX) 40 MG tablet Take 40 mg by mouth daily. 01/26/11   [provider]  pravastatin (PRAVACHOL) 40 MG tablet Take 1 tablet (40 mg total) by mouth daily. 01/21/15   Patel, Arvin Collard K, DO  pregabalin (LYRICA) 150 MG capsule Take 300 mg by mouth 2 (two) times daily.    [provider]  ranitidine (ZANTAC) 75 MG tablet Take 75 mg by mouth daily as needed for heartburn.     [provider]  warfarin (COUMADIN) 3 MG tablet TAKE AS DIRECTED BY COUMADIN CLINIC Patient taking differently: Take 3 mg by mouth daily on Sunday, Tuesday, Thursday and Saturday. Take 1.5 mg by mouth daily on Monday,  Wednesday and Friday 06/02/17   Sueanne Margarita, MD    Family History Family History  Problem Relation Age of Onset  . Cancer Mother        melanoma, ovarian  . Cancer Father        prostate  . Melanoma Unknown   . Psoriasis Unknown   . Valvular heart disease Brother   . Scoliosis Sister     Social History Social History   Tobacco Use  . Smoking status: Never Smoker  . Smokeless tobacco: Never Used  Substance Use Topics  . Alcohol use: Yes    Alcohol/week: 0.0 oz    Comment: moderate - 2-3 drinks about 3 times per week of wine, liquor, beer  . Drug use: No     Allergies   Patient has no known allergies.   Review of Systems Review of Systems  Constitutional:       Per HPI, otherwise negative  HENT:       Per HPI, otherwise negative  Respiratory:       Per HPI, otherwise negative  Cardiovascular:       Per HPI, otherwise negative  Gastrointestinal: Negative for vomiting.  Endocrine:       Negative aside from HPI  Genitourinary:       Neg aside from HPI   Musculoskeletal:       Per HPI, otherwise negative  Skin: Negative.    Neurological: Positive for dizziness and light-headedness. Negative for syncope.  Hematological: Bruises/bleeds easily.   Physical Exam Updated Vital Signs BP 100/70   Pulse 79   Resp 13   SpO2 99%   Physical Exam  Constitutional: He is oriented to person, place, and time. He appears well-developed. No distress.  HENT:  Head: Normocephalic and atraumatic.  Eyes: Conjunctivae and EOM are normal.  Cardiovascular: Regular rhythm. Tachycardia present.  Pulmonary/Chest: Effort normal. No stridor. No respiratory distress.  Abdominal: He exhibits no distension. There is no tenderness. There is no guarding.  Musculoskeletal: He exhibits no edema.  Neurological: He is alert and oriented to person, place, and time.  Skin: Skin is warm and dry.  Multiple areas of ecchymosis about the lower abdomen  Psychiatric: He has a normal mood and affect.  Nursing note and vitals reviewed.    ED Treatments / Results  Labs (all labs ordered are listed, but only abnormal results are displayed) Labs Reviewed  CBC WITH DIFFERENTIAL/PLATELET - Abnormal; Notable for the following components:      Result Value   WBC 10.6 (*)    RBC 4.18 (*)    Hemoglobin 11.3 (*)    HCT 34.5 (*)    RDW 20.8 (*)    Abs Immature Granulocytes 0.2 (*)    All other components within normal limits  COMPREHENSIVE METABOLIC PANEL - Abnormal; Notable for the following components:   CO2 21 (*)    Glucose, Bld 135 (*)    BUN 47 (*)    Total Protein 6.4 (*)    ALT 16 (*)    Alkaline Phosphatase 28 (*)    All other components within normal limits  PROTIME-INR - Abnormal; Notable for the following components:   Prothrombin Time 26.2 (*)    All other components within normal limits  LIPASE, BLOOD  I-STAT CG4 LACTIC ACID, ED  TYPE AND SCREEN    EKG EKG Interpretation  Date/Time:  Tuesday November 23 2017 12:46:42 EDT Ventricular Rate:  74 PR Interval:    QRS Duration: 160 QT  Interval:  448 QTC Calculation: 498 R  Axis:   38 Text Interpretation:  Sinus rhythm Left bundle branch block No significant change since last tracing Abnormal ekg Confirmed by Carmin Muskrat [id number]) on 11/23/2017 12:49:42 PM    Procedures Procedures (including critical care time)  Medications Ordered in ED Medications  sodium chloride 0.9 % bolus 1,000 mL (0 mLs Intravenous Stopped 11/23/17 1410)    And  0.9 %  sodium chloride infusion ( Intravenous New Bag/Given 11/23/17 1411)  pantoprazole (PROTONIX) 80 mg in sodium chloride 0.9 % 100 mL IVPB (has no administration in time range)  pantoprazole (PROTONIX) 80 mg in sodium chloride 0.9 % 250 mL (0.32 mg/mL) infusion (has no administration in time range)     Initial Impression / Assessment and Plan / ED Course  I have reviewed the triage vital signs and the nursing notes.  Pertinent labs & imaging results that were available during my care of the patient were reviewed by me and considered in my medical decision making (see chart for details).  After initial evaluation the patient was placed on continuous cardiac monitoring, received IV fluids.  I reviewed the patient's chart, notable for multiple recent procedures, initiation of anticoagulant.   2:11 PM Patient has received initial fluid resuscitation, with a systolic of 546, patient has more color in his face, states that he feels better. No additional bowel movements. I discussed patient's case with his gastroenterology team, and we reviewed patient's recent endoscopy notes together, including recent removal of a polyp, with banding. Patient's labs notable for elevated BUN, consistent with possible upper GI bleed, and the patient has anemia as well. Patient has been receiving fluids, Protonix drip. Gastroneurology follows a Doctor, general practice.  This 67 year old male presents after recent hospitalization for PFO closure, now with concern for near syncope, hypotension, and possible GI bleed. Patient has known gastric  pathology, is on Lovenox and Coumadin, suspicious for potential etiology. No early evidence for cardiac etiology. The patient was critically hypotensive, diaphoretic and pale on arrival, after initiation of IV fluids, Protonix, improved substantially. However, given his condition, he was admitted for evaluation and management after discussion with gastroneurology. Final Clinical Impressions(s) / ED Diagnoses  Near syncope Hypotension  CRITICAL CARE Performed by: Carmin Muskrat Total critical care time: 35 minutes Critical care time was exclusive of separately billable procedures and treating other patients. Critical care was necessary to treat or prevent imminent or life-threatening deterioration. Critical care was time spent personally by me on the following activities: development of treatment plan with patient and/or surrogate as well as nursing, discussions with consultants, evaluation of patient's response to treatment, examination of patient, obtaining history from patient or surrogate, ordering and performing treatments and interventions, ordering and review of laboratory studies, ordering and review of radiographic studies, pulse oximetry and re-evaluation of patient's condition.    Carmin Muskrat, MD 11/23/17 [phone]

## 2017-11-23 NOTE — ED Notes (Signed)
Dinner tray ordered for pt

## 2017-11-23 NOTE — ED Notes (Signed)
Pt tolerated orthostatic vitals without any problems

## 2017-11-23 NOTE — Progress Notes (Signed)
Called ER for report and given by Kim,RN.

## 2017-11-24 ENCOUNTER — Other Ambulatory Visit: Payer: Self-pay

## 2017-11-24 ENCOUNTER — Encounter (HOSPITAL_COMMUNITY): Payer: Self-pay

## 2017-11-24 DIAGNOSIS — Q211 Atrial septal defect: Secondary | ICD-10-CM

## 2017-11-24 DIAGNOSIS — E785 Hyperlipidemia, unspecified: Secondary | ICD-10-CM

## 2017-11-24 DIAGNOSIS — K922 Gastrointestinal hemorrhage, unspecified: Secondary | ICD-10-CM

## 2017-11-24 DIAGNOSIS — K317 Polyp of stomach and duodenum: Secondary | ICD-10-CM

## 2017-11-24 LAB — CBC
HEMATOCRIT: 22.3 % — AB (ref 39.0–52.0)
Hemoglobin: 7 g/dL — ABNORMAL LOW (ref 13.0–17.0)
MCH: 26.7 pg (ref 26.0–34.0)
MCHC: 31.4 g/dL (ref 30.0–36.0)
MCV: 85.1 fL (ref 78.0–100.0)
Platelets: 172 10*3/uL (ref 150–400)
RBC: 2.62 MIL/uL — ABNORMAL LOW (ref 4.22–5.81)
RDW: 21.3 % — AB (ref 11.5–15.5)
WBC: 4.8 10*3/uL (ref 4.0–10.5)

## 2017-11-24 LAB — PROTIME-INR
INR: 3.12
Prothrombin Time: 31.8 seconds — ABNORMAL HIGH (ref 11.4–15.2)

## 2017-11-24 LAB — BASIC METABOLIC PANEL
Anion gap: 4 — ABNORMAL LOW (ref 5–15)
BUN: 31 mg/dL — ABNORMAL HIGH (ref 6–20)
CO2: 25 mmol/L (ref 22–32)
Calcium: 7.9 mg/dL — ABNORMAL LOW (ref 8.9–10.3)
Chloride: 113 mmol/L — ABNORMAL HIGH (ref 101–111)
Creatinine, Ser: 1.1 mg/dL (ref 0.61–1.24)
GFR calc Af Amer: >60 mL/min (ref >60)
GFR calc non Af Amer: >60 mL/min (ref >60)
Glucose, Bld: 92 mg/dL (ref 65–99)
Potassium: 4.2 mmol/L (ref 3.5–5.1)
Sodium: 142 mmol/L (ref 135–145)

## 2017-11-24 LAB — HEPARIN LEVEL (UNFRACTIONATED): Heparin Unfractionated: 0.81 IU/mL — ABNORMAL HIGH (ref 0.30–0.70)

## 2017-11-24 LAB — PREPARE RBC (CROSSMATCH)

## 2017-11-24 MED ORDER — SODIUM CHLORIDE 0.9 % IV SOLN
Freq: Once | INTRAVENOUS | Status: AC
  Administered 2017-11-24: 15:00:00 via INTRAVENOUS

## 2017-11-24 NOTE — Progress Notes (Signed)
PROGRESS NOTE    Tracy Marshall.  PZW:[id number] DOB: 19-Apr-1951 DOA: 11/23/2017 PCP: Orpah Melter, MD    Brief Narrative:  67 year old male who presented with near syncope and melena.  He does have significant past medical history of CVA, history of DVT, recent PFO closure, history of gastric polyps which were recently removed.  He does have a mechanical aortic valve replacement 2001.  He reported 2 days of dark stools, associated with dyspnea and lightheadedness.  Positive lower quadrant abdominal pain.  On the initial physical examination blood pressure 102/71, heart rate 82, respiration 19, oxygen saturation 100%.  His lungs were clear to auscultation bilaterally, heart S1-S2 present and rhythmic, abdomen was soft nontender, no lower extremity edema.  Sodium 139, potassium 4.4, chloride 1 8, bicarb 21, glucose 135, BUN 47, creatinine 1.0, white count 10.6, hemoglobin 11.3, hematocrit 34.5, platelets 295.   Patient was admitted to the hospital working diagnosis upper GI bleed.   Assessment & Plan:   Principal Problem:   Postural dizziness with presyncope Active Problems:   Long term (current) use of anticoagulants   S/P AVR (aortic valve replacement)   CVA (cerebral vascular accident) ([place])   PFO (patent foramen ovale)   Hyperlipidemia LDL goal <70   Melena   Multiple gastric polyps   Near syncope   1. Upper GI bleed. Hb and Hct continue to drop, will now down to hb of 7, will transfuse 2 units prbc, and will continue to monitor H&H, will continue to hold on warfarin, INT 3,12 today, for possible upper endoscopy in am. Continue IV pantoprazole, will reduce rate of IV fluids to prevent volume overload.   2. Aortic valve replacement. Will continue to hold on warfarin, plant to bridge with heparin drip. Patient with no signs of heart failure  3. Dyslipidemia. Continue statin therapy.   4. Depression. Continue diazepam and oxcarbazepine.    DVT prophylaxis: warfarin   Code  Status: full Family Communication: I spoke with patient's wife at the bedside and all questions were addressed.  Disposition Plan: pending egd   Consultants:   GI   Procedures:     Antimicrobials:       Subjective: Persistent black stools, no nausea or vomiting, no chest pain, dizziness or orthostatic symptoms.   Objective: Vitals:   11/23/17 2158 11/23/17 2203 11/24/17 0516 11/24/17 0900  BP: 96/64 96/64 (!) 93/57 (!) 91/54  Pulse: 67 67 (!) 57 (!) 55  Resp:  18 18 18   Temp: 98.4 F (36.9 C) 98.4 F (36.9 C) 98.2 F (36.8 C) 98.4 F (36.9 C)  TempSrc: Oral Oral Oral Oral  SpO2: 98% 98% 96% 97%  Weight:  83 kg (182 lb 15.7 oz)      Intake/Output Summary (Last 24 hours) at 11/24/2017 1133 Last data filed at 11/24/2017 1120 Gross per 24 hour  Intake 3100 ml  Output 975 ml  Net 2125 ml   Filed Weights   11/23/17 2203  Weight: 83 kg (182 lb 15.7 oz)    Examination:   General: Not in pain or dyspnea.  Neurology: Awake and alert, non focal  E ENT: mild pallor, no icterus, oral mucosa moist Cardiovascular: No JVD. S1-S2 present, rhythmic, no gallops, rubs, or murmurs. No lower extremity edema. Pulmonary: vesicular breath sounds bilaterally, adequate air movement, no wheezing, rhonchi or rales. Gastrointestinal. Abdomen with no organomegaly, non tender, no rebound or guarding Skin. No rashes Musculoskeletal: no joint deformities     Data Reviewed: I have personally  reviewed following labs and imaging studies  CBC: Recent Labs  Lab 11/23/17 1236 11/23/17 2055  WBC 10.6* 9.7  NEUTROABS 7.5  --   HGB 11.3* 8.3*  HCT 34.5* 25.9*  MCV 82.5 84.1  PLT 295 078   Basic Metabolic Panel: Recent Labs  Lab 11/23/17 1236 11/24/17 0226  NA 139 142  K 4.4 4.2  CL 108 113*  CO2 21* 25  GLUCOSE 135* 92  BUN 47* 31*  CREATININE 1.00 1.10  CALCIUM 9.0 7.9*   GFR: Estimated Creatinine Clearance: 69.1 mL/min (by C-G formula based on SCr of 1.1 mg/dL). Liver  Function Tests: Recent Labs  Lab 11/23/17 1236  AST 17  ALT 16*  ALKPHOS 28*  BILITOT 0.4  PROT 6.4*  ALBUMIN 3.7   Recent Labs  Lab 11/23/17 1236  LIPASE 27   No results for input(s): AMMONIA in the last 168 hours. Coagulation Profile: Recent Labs  Lab 11/22/17 1352 11/23/17 1236 11/24/17 0226  INR 2.2 2.43 3.12   Cardiac Enzymes: No results for input(s): CKTOTAL, CKMB, CKMBINDEX, TROPONINI in the last 168 hours. BNP (last 3 results) No results for input(s): PROBNP in the last 8760 hours. HbA1C: No results for input(s): HGBA1C in the last 72 hours. CBG: No results for input(s): GLUCAP in the last 168 hours. Lipid Profile: No results for input(s): CHOL, HDL, LDLCALC, TRIG, CHOLHDL, LDLDIRECT in the last 72 hours. Thyroid Function Tests: No results for input(s): TSH, T4TOTAL, FREET4, T3FREE, THYROIDAB in the last 72 hours. Anemia Panel: No results for input(s): VITAMINB12, FOLATE, FERRITIN, TIBC, IRON, RETICCTPCT in the last 72 hours.    Radiology Studies: I have reviewed all of the imaging during this hospital visit personally     Scheduled Meds: . fenofibrate  160 mg Oral Daily  . ferrous sulfate  325 mg Oral BID WC  . fluticasone  2 spray Each Nare Daily  . multivitamin with minerals  1 tablet Oral Daily  . OXcarbazepine  300 mg Oral BID  . pravastatin  40 mg Oral Daily  . pregabalin  300 mg Oral BID   Continuous Infusions: . sodium chloride 125 mL/hr at 11/24/17 0602  . pantoprozole (PROTONIX) infusion 8 mg/hr (11/24/17 1120)     LOS: 1 day        Gonzalo Waymire Gerome Apley, MD [facility] Pager [phone]

## 2017-11-24 NOTE — Progress Notes (Signed)
ANTICOAGULATION CONSULT NOTE - Follow Up Consult  Pharmacy Consult for heparin Indication: AVR, h/o DVT, and PFO closure  Labs: Recent Labs    11/22/17 1352 11/23/17 1236 11/23/17 2055 11/24/17 0226  HGB  --  11.3* 8.3*  --   HCT  --  34.5* 25.9*  --   PLT  --  295 215  --   LABPROT  --  26.2*  --  31.8*  INR 2.2 2.43  --  3.12  HEPARINUNFRC  --   --   --  0.81*  CREATININE  --  1.00  --  1.10    Assessment/Plan:  Heparin level above goal but this am's INR is now at goal (3.0-3.5); per outpt [facility] clinic notes pt was to take boosted Coumadin dose on 6/3 which likely caused delayed INR increase.  Will hold heparin gtt for now and monitor INR to resume heparin when <3.  Wynona Neat, PharmD, BCPS  11/24/2017,4:18 AM

## 2017-11-24 NOTE — Plan of Care (Signed)
  Problem: Education: Goal: Knowledge of General Education information will improve Outcome: Progressing Note:  POC and orders reviewed with pt./wife.

## 2017-11-24 NOTE — Progress Notes (Signed)
[facility] Progress Note  Tracy Marshall. 67 y.o. 1950-10-16   Subjective: Small amount of black stool recently per patient. No melena overnight. Denies abdominal pain, N/V. Reports ambulating this morning. Wife in room.  Objective: Vital signs: Vitals:   11/24/17 0516 11/24/17 0900  BP: (!) 93/57 (!) 91/54  Pulse: (!) 57 (!) 55  Resp: 18 18  Temp: 98.2 F (36.8 C) 98.4 F (36.9 C)  SpO2: 96% 97%    Physical Exam: Gen: lethargic, no acute distress  HEENT: anicteric sclera CV: RRR Chest: CTA B Abd: soft, nontender, nondistended, +BS  Lab Results: Recent Labs    11/23/17 1236 11/24/17 0226  NA 139 142  K 4.4 4.2  CL 108 113*  CO2 21* 25  GLUCOSE 135* 92  BUN 47* 31*  CREATININE 1.00 1.10  CALCIUM 9.0 7.9*   Recent Labs    11/23/17 1236  AST 17  ALT 16*  ALKPHOS 28*  BILITOT 0.4  PROT 6.4*  ALBUMIN 3.7   Recent Labs    11/23/17 1236 11/23/17 2055  WBC 10.6* 9.7  NEUTROABS 7.5  --   HGB 11.3* 8.3*  HCT 34.5* 25.9*  MCV 82.5 84.1  PLT 295 215      Assessment/Plan: Melenic stools - question if related to gastric polypectomy sites. Will postpone EGD until tomorrow due to INR 3.12 in case therapeutic maneuvers of the polypectomy site is needed. Continue Protonix drip. Will hold Heparin 4 hours prior to the procedure. Full liquid for lunch and clear liquids for dinner. NPO p MN.   [place] C. 11/24/2017, 11:35 AM  Questions please call [phone] ID: Tracy Marshall., male   DOB: 1950/08/10, 67 y.o.   MRN: [id number]

## 2017-11-24 NOTE — Progress Notes (Signed)
Pt. transported via stretcher from ER to 30M-15; alert and oriented x4; wife at bedside. Reviewed orders with pt./wife; oriented to room and call button.

## 2017-11-24 NOTE — Care Management Note (Signed)
Case Management Note  Patient Details  Name: Tracy Marshall. MRN: [id number] Date of Birth: Sep 15, 1950  Subjective/Objective:                 Independent patient from home w wife. Admitted with melena, elevated INR, recent polyp removal on 5/29. Hep and protonix gtt. Plan for EGD tomorrow pending improvement of INR.    Action/Plan:  No needs anticipated, will continue to follow.  Expected Discharge Date:                  Expected Discharge Plan:  Home/Self Care  In-House Referral:     Discharge planning Services  CM Consult  Post Acute Care Choice:    Choice offered to:     DME Arranged:    DME Agency:     HH Arranged:    HH Agency:     Status of Service:  In process, will continue to follow  If discussed at Long Length of Stay Meetings, dates discussed:    Additional Comments:  Carles Collet, RN 11/24/2017, 2:12 PM

## 2017-11-24 NOTE — H&P (View-Only) (Signed)
[facility] Progress Note  Tracy Marshall. 67 y.o. 02/19/1951   Subjective: Small amount of black stool recently per patient. No melena overnight. Denies abdominal pain, N/V. Reports ambulating this morning. Wife in room.  Objective: Vital signs: Vitals:   11/24/17 0516 11/24/17 0900  BP: (!) 93/57 (!) 91/54  Pulse: (!) 57 (!) 55  Resp: 18 18  Temp: 98.2 F (36.8 C) 98.4 F (36.9 C)  SpO2: 96% 97%    Physical Exam: Gen: lethargic, no acute distress  HEENT: anicteric sclera CV: RRR Chest: CTA B Abd: soft, nontender, nondistended, +BS  Lab Results: Recent Labs    11/23/17 1236 11/24/17 0226  NA 139 142  K 4.4 4.2  CL 108 113*  CO2 21* 25  GLUCOSE 135* 92  BUN 47* 31*  CREATININE 1.00 1.10  CALCIUM 9.0 7.9*   Recent Labs    11/23/17 1236  AST 17  ALT 16*  ALKPHOS 28*  BILITOT 0.4  PROT 6.4*  ALBUMIN 3.7   Recent Labs    11/23/17 1236 11/23/17 2055  WBC 10.6* 9.7  NEUTROABS 7.5  --   HGB 11.3* 8.3*  HCT 34.5* 25.9*  MCV 82.5 84.1  PLT 295 215      Assessment/Plan: Melenic stools - question if related to gastric polypectomy sites. Will postpone EGD until tomorrow due to INR 3.12 in case therapeutic maneuvers of the polypectomy site is needed. Continue Protonix drip. Will hold Heparin 4 hours prior to the procedure. Full liquid for lunch and clear liquids for dinner. NPO p MN.   [place] C. 11/24/2017, 11:35 AM  Questions please call [phone] ID: [name], male   DOB: [birth year], 67 y.o.   MRN: [id number]

## 2017-11-25 ENCOUNTER — Encounter (HOSPITAL_COMMUNITY)
Admission: EM | Disposition: A | Discharge status: Home / Self Care | Payer: Self-pay | Source: Home / Self Care | Attending: Internal Medicine

## 2017-11-25 ENCOUNTER — Encounter (HOSPITAL_COMMUNITY): Payer: Self-pay | Admitting: Anesthesiology

## 2017-11-25 ENCOUNTER — Inpatient Hospital Stay (HOSPITAL_COMMUNITY): Payer: Medicare Other | Admitting: Anesthesiology

## 2017-11-25 DIAGNOSIS — Z952 Presence of prosthetic heart valve: Secondary | ICD-10-CM

## 2017-11-25 HISTORY — PX: SUBMUCOSAL INJECTION: SHX5543

## 2017-11-25 HISTORY — PX: ESOPHAGOGASTRODUODENOSCOPY (EGD) WITH PROPOFOL: SHX5813

## 2017-11-25 LAB — CBC
HCT: 30.1 % — ABNORMAL LOW (ref 39.0–52.0)
HCT: 30.8 % — ABNORMAL LOW (ref 39.0–52.0)
HEMOGLOBIN: 9.9 g/dL — AB (ref 13.0–17.0)
HEMOGLOBIN: 10 g/dL — AB (ref 13.0–17.0)
MCH: 28 pg (ref 26.0–34.0)
MCH: 28.2 pg (ref 26.0–34.0)
MCHC: 32.9 g/dL (ref 30.0–36.0)
MCHC: 32.5 g/dL (ref 30.0–36.0)
MCV: 85.3 fL (ref 78.0–100.0)
MCV: 87 fL (ref 78.0–100.0)
Platelets: 184 10*3/uL (ref 150–400)
Platelets: 202 10*3/uL (ref 150–400)
RBC: 3.53 MIL/uL — ABNORMAL LOW (ref 4.22–5.81)
RBC: 3.54 MIL/uL — ABNORMAL LOW (ref 4.22–5.81)
RDW: 18.3 % — ABNORMAL HIGH (ref 11.5–15.5)
RDW: 18.1 % — ABNORMAL HIGH (ref 11.5–15.5)
WBC: 5.7 10*3/uL (ref 4.0–10.5)
WBC: 5.5 10*3/uL (ref 4.0–10.5)

## 2017-11-25 LAB — TYPE AND SCREEN
ABO/RH(D): O POS
ANTIBODY SCREEN: NEGATIVE
Unit division: 0
Unit division: 0

## 2017-11-25 LAB — BPAM RBC
BLOOD PRODUCT EXPIRATION DATE: 201907052359
Blood Product Expiration Date: 201907052359
ISSUE DATE / TIME: 201906051514
ISSUE DATE / TIME: 201906051716
UNIT TYPE AND RH: 5100
Unit Type and Rh: 5100

## 2017-11-25 LAB — HEPARIN LEVEL (UNFRACTIONATED): Heparin Unfractionated: 0.3 IU/mL (ref 0.30–0.70)

## 2017-11-25 LAB — PROTIME-INR
INR: 2.34
PROTHROMBIN TIME: 25.4 s — AB (ref 11.4–15.2)

## 2017-11-25 SURGERY — ESOPHAGOGASTRODUODENOSCOPY (EGD) WITH PROPOFOL
Anesthesia: Monitor Anesthesia Care

## 2017-11-25 MED ORDER — HEPARIN (PORCINE) IN NACL 100-0.45 UNIT/ML-% IJ SOLN
900.0000 [IU]/h | INTRAMUSCULAR | Status: DC
  Administered 2017-11-25: 1000 [IU]/h via INTRAVENOUS
  Administered 2017-11-26: 1050 [IU]/h via INTRAVENOUS
  Administered 2017-11-27: 950 [IU]/h via INTRAVENOUS
  Filled 2017-11-25 (×4): qty 250

## 2017-11-25 MED ORDER — LIDOCAINE 2% (20 MG/ML) 5 ML SYRINGE
INTRAMUSCULAR | Status: DC | PRN
  Administered 2017-11-25: 40 mg via INTRAVENOUS

## 2017-11-25 MED ORDER — LACTATED RINGERS IV SOLN
INTRAVENOUS | Status: DC
  Administered 2017-11-25: 11:00:00 via INTRAVENOUS

## 2017-11-25 MED ORDER — FENTANYL CITRATE (PF) 100 MCG/2ML IJ SOLN
25.0000 ug | Freq: Once | INTRAMUSCULAR | Status: AC
  Administered 2017-11-25: 25 ug via INTRAVENOUS

## 2017-11-25 MED ORDER — FENTANYL CITRATE (PF) 100 MCG/2ML IJ SOLN
INTRAMUSCULAR | Status: AC
  Filled 2017-11-25: qty 2

## 2017-11-25 MED ORDER — SODIUM CHLORIDE 0.9 % IJ SOLN
PREFILLED_SYRINGE | INTRAMUSCULAR | Status: DC | PRN
  Administered 2017-11-25: 3 mL

## 2017-11-25 MED ORDER — PROPOFOL 10 MG/ML IV BOLUS
INTRAVENOUS | Status: DC | PRN
  Administered 2017-11-25: 20 mg via INTRAVENOUS
  Administered 2017-11-25: 50 mg via INTRAVENOUS
  Administered 2017-11-25: 20 mg via INTRAVENOUS
  Administered 2017-11-25: 10 mg via INTRAVENOUS
  Administered 2017-11-25 (×5): 20 mg via INTRAVENOUS

## 2017-11-25 MED ORDER — EPHEDRINE SULFATE-NACL 50-0.9 MG/10ML-% IV SOSY
PREFILLED_SYRINGE | INTRAVENOUS | Status: DC | PRN
  Administered 2017-11-25: 5 mg via INTRAVENOUS
  Administered 2017-11-25: 10 mg via INTRAVENOUS
  Administered 2017-11-25 (×3): 5 mg via INTRAVENOUS

## 2017-11-25 MED ORDER — SODIUM CHLORIDE 0.9 % IV SOLN: INTRAVENOUS | Status: DC

## 2017-11-25 MED ORDER — PHENOL 1.4 % MT LIQD
1.0000 | OROMUCOSAL | Status: DC | PRN
  Filled 2017-11-25: qty 177

## 2017-11-25 MED ORDER — EPINEPHRINE PF 1 MG/10ML IJ SOSY
PREFILLED_SYRINGE | INTRAMUSCULAR | Status: AC
  Filled 2017-11-25: qty 10

## 2017-11-25 SURGICAL SUPPLY — 15 items

## 2017-11-25 NOTE — Progress Notes (Signed)
ANTICOAGULATION CONSULT NOTE - Initial Consult  Pharmacy Consult for Heparin Indication: AVR, h/o DVT, and PFO closure    No Known Allergies  Patient Measurements: Height: 5\' 10"  (177.8 cm) Weight: 182 lb (82.6 kg) IBW/kg (Calculated) : 73   Vital Signs: Temp: 98.7 F (37.1 C) (06/06 1153) Temp Source: Oral (06/06 1153) BP: 128/61 (06/06 1240) Pulse Rate: 59 (06/06 1240)  Labs: Recent Labs    11/23/17 1236 11/23/17 2055 11/24/17 0226 11/24/17 1226 11/25/17 0507  HGB 11.3* 8.3*  --  7.0* 9.9*  HCT 34.5* 25.9*  --  22.3* 30.1*  PLT 295 215  --  172 184  LABPROT 26.2*  --  31.8*  --  25.4*  INR 2.43  --  3.12  --  2.34  HEPARINUNFRC  --   --  0.81*  --   --   CREATININE 1.00  --  1.10  --   --     Estimated Creatinine Clearance: 69.1 mL/min (by C-G formula based on SCr of 1.1 mg/dL).   Medical History: Past Medical History:  Diagnosis Date  . Anemia    iron  . Arthritis   . Atypical moles    melanomna  . Basal cell carcinoma   . Bilateral carotid artery stenosis 08/03/2014   1-39% right and 40-59% left carotid artery stenosis  . Chronic anticoagulation    for mechanical AVR  . Cluster headaches   . CVA (cerebral vascular accident) ([place]) 07/24/2014    MRI indicating 2 punctate foci of acute infarction.  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   . Diverticulosis   . Dizziness   . Episodic recurrent vertigo 2002 & 2005   carotid dopplers w no clinically significant stenosis  . Fatty liver    hx of elevated hepatic transaminases-negative workup in 2009 except for U/S suggesting fatty liver  . GERD (gastroesophageal reflux disease)   . Gout   . Heart murmur   . Hyperlipidemia   . Hyperlipidemia LDL goal <70 12/05/2015  . Joint pain   . LBBB (left bundle branch block)   . Melanoma ([place])    hx of melanoma and multiple basal cell carcinomas Dr Tonia Brooms  . PFO (patent foramen ovale) 12/05/2015  .  Positive ANA (antinuclear antibody)   . S/P AVR (aortic valve replacement)    mechanical  . TIA (transient ischemic attack)      Assessment: 67 yo male with GI bleed previously on warfarin for AVR and h/o DVT PTA. Initially patient's INR 3.12 and heparin held until INR <3. INR this am was 2.34, but patient was to go to EGD and heparin continued to be held until after EGD. Patient is now s/p EGD and MD wishes to start heparin (warfarin to be delayed for 2 days post EGD). Will use lower heparin level goal range in setting of a GI bleed and we will not bolus.    Goal of Therapy:  Heparin level 0.3-0.5 Monitor platelets by anticoagulation protocol: Yes   Plan:  Heparin drip 1000 units/hr (no bolus) Heparin level in 6 hours Heparin level and CBC daily Monitor CBC for Hgb drop and other s/sx of bleeding   [place] Cost A. Levada Dy, PharmD, [place] Pager: [phone]  11/25/2017,1:00 PM

## 2017-11-25 NOTE — Progress Notes (Signed)
ANTICOAGULATION CONSULT NOTE  Pharmacy Consult for Heparin Indication: AVR, h/o DVT, and PFO closure  No Known Allergies  Patient Measurements: Height: 5\' 10"  (177.8 cm) Weight: 180 lb 6.4 oz (81.8 kg) IBW/kg (Calculated) : 73   Vital Signs: Temp: 98.5 F (36.9 C) (06/06 2025) Temp Source: Oral (06/06 2025) BP: 93/64 (06/06 2025) Pulse Rate: 56 (06/06 2025)  Labs: Recent Labs    11/23/17 1236  11/24/17 0226 11/24/17 1226 11/25/17 0507 11/25/17 1431 11/25/17 1946  HGB 11.3*   < >  --  7.0* 9.9* 10.0*  --   HCT 34.5*   < >  --  22.3* 30.1* 30.8*  --   PLT 295   < >  --  172 184 202  --   LABPROT 26.2*  --  31.8*  --  25.4*  --   --   INR 2.43  --  3.12  --  2.34  --   --   HEPARINUNFRC  --   --  0.81*  --   --   --  0.30  CREATININE 1.00  --  1.10  --   --   --   --    < > = values in this interval not displayed.    Estimated Creatinine Clearance: 69.1 mL/min (by C-G formula based on SCr of 1.1 mg/dL).   Assessment: 67 yo male with GI bleed previously on warfarin for AVR and h/o DVT PTA. Initially patient's INR 3.12 and heparin held until INR <3. Patient is now s/p EGD and MD wishes to start heparin (warfarin to be delayed for 2 days post EGD). Will use lower heparin level goal range in setting of a GI bleed and we will not bolus.   Heparin level is therapeutic at 0.3 on low end of normal. No bleeding noted. Will increase drip rate to keep heparin level >0.3.  Goal of Therapy:  Heparin level 0.3-0.5 units/ml Monitor platelets by anticoagulation protocol: Yes   Plan:  Increase heparin drip to 1100 units/hr Heparin level and CBC daily Monitor CBC for Hgb drop and other s/sx of bleeding   Tracy Marshall, PharmD, BCPS Clinical Pharmacist Clinical phone for 11/25/2017 until 10p is [phone] 11/25/2017 9:07 PM

## 2017-11-25 NOTE — Anesthesia Postprocedure Evaluation (Signed)
Anesthesia Post Note  Patient: Tracy Marshall.  Procedure(s) Performed: ESOPHAGOGASTRODUODENOSCOPY (EGD) WITH PROPOFOL (N/A ) SUBMUCOSAL INJECTION of epinephrine     Patient location during evaluation: PACU Anesthesia Type: MAC Level of consciousness: awake and alert Pain management: pain level controlled Vital Signs Assessment: post-procedure vital signs reviewed and stable Respiratory status: spontaneous breathing, nonlabored ventilation, respiratory function stable and patient connected to nasal cannula oxygen Cardiovascular status: stable and blood pressure returned to baseline Postop Assessment: no apparent nausea or vomiting Anesthetic complications: no    Last Vitals:  Vitals:   11/25/17 1053 11/25/17 1153  BP: 106/60 (!) 116/57  Pulse: 64 64  Resp: 12 16  Temp: 37.2 C 37.1 C  SpO2: 100% 95%    Last Pain:  Vitals:   11/25/17 1153  TempSrc: Oral  PainSc: 4                  Johnanna Bakke

## 2017-11-25 NOTE — Interval H&P Note (Signed)
History and Physical Interval Note:  11/25/2017 11:05 AM  Tracy Marshall.  has presented today for surgery, with the diagnosis of GI bleed  The various methods of treatment have been discussed with the patient and family. After consideration of risks, benefits and other options for treatment, the patient has consented to  Procedure(s): ESOPHAGOGASTRODUODENOSCOPY (EGD) WITH PROPOFOL (N/A) as a surgical intervention .  The patient's history has been reviewed, patient examined, no change in status, stable for surgery.  I have reviewed the patient's chart and labs.  Questions were answered to the patient's satisfaction.     [place] C.

## 2017-11-25 NOTE — Progress Notes (Signed)
PROGRESS NOTE    Tracy Marshall.  TGG:[id number] DOB: 1951-01-07 DOA: 11/23/2017 PCP: Orpah Melter, MD    Brief Narrative:  67 year old male who presented with near syncope and melena.  He does have significant past medical history of CVA, history of DVT, recent PFO closure, history of gastric polyps which were recently removed.  He does have a mechanical aortic valve replacement 2001.  He reported 2 days of dark stools, associated with dyspnea and lightheadedness.  Positive lower quadrant abdominal pain.  On the initial physical examination blood pressure 102/71, heart rate 82, respiration 19, oxygen saturation 100%.  His lungs were clear to auscultation bilaterally, heart S1-S2 present and rhythmic, abdomen was soft nontender, no lower extremity edema.  Sodium 139, potassium 4.4, chloride 1 8, bicarb 21, glucose 135, BUN 47, creatinine 1.0, white count 10.6, hemoglobin 11.3, hematocrit 34.5, platelets 295.   Patient was admitted to the hospital working diagnosis upper GI bleed.   Assessment & Plan:   Principal Problem:   Postural dizziness with presyncope Active Problems:   Long term (current) use of anticoagulants   S/P AVR (aortic valve replacement)   CVA (cerebral vascular accident) ([place])   PFO (patent foramen ovale)   Hyperlipidemia LDL goal <70   Melena   Multiple gastric polyps   Near syncope  1. Upper GI bleed. Stable hb and hct after 2 units prbc, with hb and hct at 9.9/30 and 10/30. INR at 2.34. Patient underwent upper endoscopy finding bleeding stigmata on one of the sites of polypectomy, now sp epi/ saline injection. Will continue aggressive antiacid therapy with pantoprazole drip and continue close monitoring. Diet advanced to clear liqudis.    2. Aortic valve replacement. Off anticoagulation for the next 2 days, will resume heparin drip, patient likely will need enoxaparin bridging at discharge.   3. Dyslipidemia. Continue statin and fenofibrate therapy with good  toleration.   4. Depression. On diazepam and oxcarbazepine. No confusion or agitation.    DVT prophylaxis: warfarin   Code Status: full Family Communication: I spoke with patient's wife at the bedside and all questions were addressed.  Disposition Plan: pending egd   Consultants:   GI   Procedures:     Antimicrobials:      Subjective: Patient is feeling well, no nausea or vomiting, no chest pain or dyspnea, no abdominal pain.   Objective: Vitals:   11/25/17 1200 11/25/17 1220 11/25/17 1236 11/25/17 1240  BP: (!) 124/51 (!) 125/58 128/61 128/61  Pulse: 61 61 65 (!) 59  Resp: 18 14 14 14   Temp:      TempSrc:      SpO2: 96% 95% 94% 95%  Weight:      Height:        Intake/Output Summary (Last 24 hours) at 11/25/2017 1316 Last data filed at 11/25/2017 1136 Gross per 24 hour  Intake 1800 ml  Output 0 ml  Net 1800 ml   Filed Weights   11/23/17 2203 11/25/17 1053  Weight: 83 kg (182 lb 15.7 oz) 82.6 kg (182 lb)    Examination:   General: Not in pain or dyspnea, deconditioned Neurology: Awake and alert, non focal  E ENT: mild pallor, no icterus, oral mucosa moist Cardiovascular: No JVD. S1-S2 present, rhythmic, no gallops, rubs, or murmurs. No lower extremity edema. Pulmonary: vesicular breath sounds bilaterally, adequate air movement, no wheezing, rhonchi or rales. Gastrointestinal. Abdomen with no organomegaly, non tender, no rebound or guarding Skin. No rashes Musculoskeletal: no joint deformities  Data Reviewed: I have personally reviewed following labs and imaging studies  CBC: Recent Labs  Lab 11/23/17 1236 11/23/17 2055 11/24/17 1226 11/25/17 0507  WBC 10.6* 9.7 4.8 5.7  NEUTROABS 7.5  --   --   --   HGB 11.3* 8.3* 7.0* 9.9*  HCT 34.5* 25.9* 22.3* 30.1*  MCV 82.5 84.1 85.1 85.3  PLT 295 215 172 353   Basic Metabolic Panel: Recent Labs  Lab 11/23/17 1236 11/24/17 0226  NA 139 142  K 4.4 4.2  CL 108 113*  CO2 21* 25    GLUCOSE 135* 92  BUN 47* 31*  CREATININE 1.00 1.10  CALCIUM 9.0 7.9*   GFR: Estimated Creatinine Clearance: 69.1 mL/min (by C-G formula based on SCr of 1.1 mg/dL). Liver Function Tests: Recent Labs  Lab 11/23/17 1236  AST 17  ALT 16*  ALKPHOS 28*  BILITOT 0.4  PROT 6.4*  ALBUMIN 3.7   Recent Labs  Lab 11/23/17 1236  LIPASE 27   No results for input(s): AMMONIA in the last 168 hours. Coagulation Profile: Recent Labs  Lab 11/22/17 1352 11/23/17 1236 11/24/17 0226 11/25/17 0507  INR 2.2 2.43 3.12 2.34   Cardiac Enzymes: No results for input(s): CKTOTAL, CKMB, CKMBINDEX, TROPONINI in the last 168 hours. BNP (last 3 results) No results for input(s): PROBNP in the last 8760 hours. HbA1C: No results for input(s): HGBA1C in the last 72 hours. CBG: No results for input(s): GLUCAP in the last 168 hours. Lipid Profile: No results for input(s): CHOL, HDL, LDLCALC, TRIG, CHOLHDL, LDLDIRECT in the last 72 hours. Thyroid Function Tests: No results for input(s): TSH, T4TOTAL, FREET4, T3FREE, THYROIDAB in the last 72 hours. Anemia Panel: No results for input(s): VITAMINB12, FOLATE, FERRITIN, TIBC, IRON, RETICCTPCT in the last 72 hours.    Radiology Studies: I have reviewed all of the imaging during this hospital visit personally     Scheduled Meds: . fenofibrate  160 mg Oral Daily  . ferrous sulfate  325 mg Oral BID WC  . fluticasone  2 spray Each Nare Daily  . multivitamin with minerals  1 tablet Oral Daily  . OXcarbazepine  300 mg Oral BID  . pravastatin  40 mg Oral Daily  . pregabalin  300 mg Oral BID   Continuous Infusions: . heparin    . pantoprozole (PROTONIX) infusion 8 mg/hr (11/25/17 0542)     LOS: 2 days        Laryssa Hassing Gerome Apley, MD [facility] Pager [phone]

## 2017-11-25 NOTE — Brief Op Note (Signed)
Healing gastric polypectomy sites but bleeding stigmata seen in one of the sites that was not hemoclipped last week. No active bleeding or blood products seen in stomach. ERQ:[id number] submucosally injected with good blanching. Hold off on starting Coumadin for another 2 days and use IV Heparin in that interval. Continue Protonix drip. Clear liquid diet only today and if stable will slowly advance tomorrow. Complete note on endopro.

## 2017-11-25 NOTE — Anesthesia Procedure Notes (Signed)
Procedure Name: MAC Date/Time: 11/25/2017 11:28 AM Performed by: Renato Shin, CRNA Pre-anesthesia Checklist: Patient identified, Emergency Drugs available, Suction available and Patient being monitored Patient Re-evaluated:Patient Re-evaluated prior to induction Oxygen Delivery Method: Nasal cannula Preoxygenation: Pre-oxygenation with 100% oxygen Induction Type: IV induction Placement Confirmation: positive ETCO2,  CO2 detector and breath sounds checked- equal and bilateral Dental Injury: Teeth and Oropharynx as per pre-operative assessment

## 2017-11-25 NOTE — Anesthesia Preprocedure Evaluation (Signed)
Anesthesia Evaluation  Patient identified by MRN, date of birth, ID band Patient awake    Reviewed: Allergy & Precautions, NPO status , Patient's Chart, lab work & pertinent test results  History of Anesthesia Complications Negative for: history of anesthetic complications  Airway Mallampati: II  TM Distance: >3 FB Neck ROM: Full    Dental no notable dental hx. (+) Dental Advisory Given   Pulmonary neg pulmonary ROS,    Pulmonary exam normal        Cardiovascular + Peripheral Vascular Disease  negative cardio ROS Normal cardiovascular exam+ dysrhythmias Atrial Fibrillation + Valvular Problems/Murmurs   S/P AVR   Neuro/Psych TIACVA negative psych ROS   GI/Hepatic Neg liver ROS, GERD  ,  Endo/Other  negative endocrine ROS  Renal/GU negative Renal ROS     Musculoskeletal  (+) Arthritis ,   Abdominal   Peds  Hematology   Anesthesia Other Findings   Reproductive/Obstetrics                             Anesthesia Physical Anesthesia Plan  ASA: III  Anesthesia Plan: MAC   Post-op Pain Management:    Induction: Intravenous  PONV Risk Score and Plan:   Airway Management Planned: Mask, Natural Airway and Nasal Cannula  Additional Equipment:   Intra-op Plan:   Post-operative Plan:   Informed Consent: I have reviewed the patients History and Physical, chart, labs and discussed the procedure including the risks, benefits and alternatives for the proposed anesthesia with the patient or authorized representative who has indicated his/her understanding and acceptance.     Plan Discussed with: CRNA  Anesthesia Plan Comments:         Anesthesia Quick Evaluation

## 2017-11-25 NOTE — Transfer of Care (Signed)
Immediate Anesthesia Transfer of Care Note  Patient: Tracy Marshall.  Procedure(s) Performed: ESOPHAGOGASTRODUODENOSCOPY (EGD) WITH PROPOFOL (N/A )  Patient Location: PACU and Endoscopy Unit  Anesthesia Type:MAC  Level of Consciousness: awake, alert , oriented and patient cooperative  Airway & Oxygen Therapy: Patient Spontanous Breathing and Patient connected to nasal cannula oxygen  Post-op Assessment: Report given to RN and Post -op Vital signs reviewed and stable  Post vital signs: Reviewed and stable  Last Vitals:  Vitals Value Taken Time  BP    Temp    Pulse    Resp    SpO2      Last Pain:  Vitals:   11/25/17 1053  TempSrc: Oral  PainSc: 0-No pain         Complications: No apparent anesthesia complications

## 2017-11-25 NOTE — Op Note (Signed)
[facility] Patient Name: Tracy Marshall Procedure Date : 11/25/2017 MRN: [id number] Attending MD: Lear Ng , MD Date of Birth: 1950/12/20 CSN: [id number] Age: 67 Admit Type: Inpatient Procedure:                Upper GI endoscopy Indications:              Iron deficiency anemia, Melena Providers:                Lear Ng, MD, Baird Cancer, RN, Elspeth Cho Tech., Technician, Lowell Bouton, CRNA Referring MD:             hospital team Medicines:                Propofol per Anesthesia, Monitored Anesthesia Care Complications:            No immediate complications. Estimated Blood Loss:     Estimated blood loss was minimal. Procedure:                Pre-Anesthesia Assessment:                           - Prior to the procedure, a History and Physical                            was performed, and patient medications and                            allergies were reviewed. The patient's tolerance of                            previous anesthesia was also reviewed. The risks                            and benefits of the procedure and the sedation                            options and risks were discussed with the patient.                            All questions were answered, and informed consent                            was obtained. Prior Anticoagulants: The patient has                            taken Coumadin (warfarin), last dose was stopped at                            admission. ASA Grade Assessment: III - A patient                            with severe systemic disease. After reviewing the  risks and benefits, the patient was deemed in                            satisfactory condition to undergo the procedure.                           After obtaining informed consent, the endoscope was                            passed under direct vision. Throughout the                            procedure,  the patient's blood pressure, pulse, and                            oxygen saturations were monitored continuously. The                            EG-[id number] ([id number]) scope was introduced through the                            mouth, and advanced to the second part of duodenum.                            The upper GI endoscopy was accomplished without                            difficulty. The patient tolerated the procedure                            well. Scope In: Scope Out: Findings:      The examined esophagus was normal.      The Z-line was regular and was found 42 cm from the incisors.      One non-bleeding superficial gastric ulcer (healing polypectomy site)       with pigmented material was found on the anterior wall of the gastric       body. The lesion was 4 mm in largest dimension. Area was successfully       injected with 3 mL of a 1:10,000 solution of epinephrine for prevent       further bleeding. Estimated blood loss was minimal.      One non-bleeding superficial gastric ulcer with no stigmata of bleeding       was found on the posterior wall of the gastric body. The lesion was 3 mm       in largest dimension. Hemoclip attached to healing polypectomy site.      The examined duodenum was normal.      There is no endoscopic evidence of bleeding in the entire examined       stomach. Impression:               - Normal esophagus.                           - Z-line regular, 42 cm from the incisors.                           -  Non-bleeding gastric ulcer with pigmented                            material. Injected.                           - Non-bleeding gastric ulcer with no stigmata of                            bleeding.                           - Normal examined duodenum.                           - No specimens collected. Recommendation:           - Clear liquid diet.                           - Observe patient's clinical course.                           - Give  Protonix (pantoprazole): 8 mg/hr IV by                            continuous infusion.                           - Hold off on resuming Coumadin for another 2 days. Procedure Code(s):        --- Professional ---                           [phone], Esophagogastroduodenoscopy, flexible,                            transoral; diagnostic, including collection of                            specimen(s) by brushing or washing, when performed                            (separate procedure) Diagnosis Code(s):        --- Professional ---                           K92.1, Melena (includes Hematochezia)                           K25.9, Gastric ulcer, unspecified as acute or                            chronic, without hemorrhage or perforation                           D50.9, Iron deficiency anemia, unspecified CPT copyright 2017 American Medical Association. All rights reserved. The codes documented in this report are preliminary and upon coder review may  be  revised to meet current compliance requirements. Lear Ng, MD 11/25/2017 11:54:06 AM This report has been signed electronically. Number of Addenda: 0

## 2017-11-26 LAB — BASIC METABOLIC PANEL
Anion gap: 4 — ABNORMAL LOW (ref 5–15)
BUN: 8 mg/dL (ref 6–20)
CALCIUM: 8.5 mg/dL — AB (ref 8.9–10.3)
CHLORIDE: 112 mmol/L — AB (ref 101–111)
CO2: 26 mmol/L (ref 22–32)
CREATININE: 1.04 mg/dL (ref 0.61–1.24)
GFR calc non Af Amer: >60 mL/min (ref >60)
GLUCOSE: 98 mg/dL (ref 65–99)
Potassium: 3.4 mmol/L — ABNORMAL LOW (ref 3.5–5.1)
Sodium: 142 mmol/L (ref 135–145)

## 2017-11-26 LAB — CBC WITH DIFFERENTIAL/PLATELET
Abs Immature Granulocytes: 0 10*3/uL (ref 0.0–0.1)
BASOS PCT: 0 %
Basophils Absolute: 0 10*3/uL (ref 0.0–0.1)
EOS ABS: 0.1 10*3/uL (ref 0.0–0.7)
EOS PCT: 3 %
HEMATOCRIT: 28.3 % — AB (ref 39.0–52.0)
Hemoglobin: 9.2 g/dL — ABNORMAL LOW (ref 13.0–17.0)
Immature Granulocytes: 1 %
LYMPHS ABS: 1.8 10*3/uL (ref 0.7–4.0)
Lymphocytes Relative: 34 %
MCH: 28.2 pg (ref 26.0–34.0)
MCHC: 32.5 g/dL (ref 30.0–36.0)
MCV: 86.8 fL (ref 78.0–100.0)
MONO ABS: 0.5 10*3/uL (ref 0.1–1.0)
MONOS PCT: 10 %
NEUTROS PCT: 52 %
Neutro Abs: 2.8 10*3/uL (ref 1.7–7.7)
PLATELETS: 175 10*3/uL (ref 150–400)
RBC: 3.26 MIL/uL — ABNORMAL LOW (ref 4.22–5.81)
RDW: 18 % — AB (ref 11.5–15.5)
WBC: 5.3 10*3/uL (ref 4.0–10.5)

## 2017-11-26 LAB — HEPARIN LEVEL (UNFRACTIONATED)
HEPARIN UNFRACTIONATED: 0.48 [IU]/mL (ref 0.30–0.70)
Heparin Unfractionated: 0.52 IU/mL (ref 0.30–0.70)

## 2017-11-26 LAB — PROTIME-INR
INR: 2.25
Prothrombin Time: 24.7 seconds — ABNORMAL HIGH (ref 11.4–15.2)

## 2017-11-26 MED ORDER — PANTOPRAZOLE SODIUM 40 MG IV SOLR
40.0000 mg | Freq: Two times a day (BID) | INTRAVENOUS | Status: DC
  Administered 2017-11-26: 40 mg via INTRAVENOUS
  Filled 2017-11-26 (×2): qty 40

## 2017-11-26 MED ORDER — DIAZEPAM 2 MG PO TABS
4.0000 mg | ORAL_TABLET | Freq: Every evening | ORAL | Status: DC | PRN
  Administered 2017-11-26 – 2017-11-27 (×2): 4 mg via ORAL
  Filled 2017-11-26 (×2): qty 2

## 2017-11-26 MED ORDER — DIAZEPAM 2 MG PO TABS
2.0000 mg | ORAL_TABLET | Freq: Once | ORAL | Status: AC
  Administered 2017-11-26: 2 mg via ORAL
  Filled 2017-11-26: qty 1

## 2017-11-26 NOTE — Progress Notes (Signed)
ANTICOAGULATION CONSULT NOTE  Pharmacy Consult for Heparin Indication: AVR, h/o DVT, and PFO closure  No Known Allergies  Patient Measurements: Height: 5\' 10"  (177.8 cm) Weight: 180 lb 6.4 oz (81.8 kg) IBW/kg (Calculated) : 73   Vital Signs: Temp: 98 F (36.7 C) (06/07 0802) Temp Source: Oral (06/07 0539) BP: 102/66 (06/07 0802) Pulse Rate: 50 (06/07 0802)  Labs: Recent Labs    11/24/17 0226  11/25/17 0507 11/25/17 1431 11/25/17 1946 11/26/17 0640 11/26/17 1430  HGB  --    < > 9.9* 10.0*  --  9.2*  --   HCT  --    < > 30.1* 30.8*  --  28.3*  --   PLT  --    < > 184 202  --  175  --   LABPROT 31.8*  --  25.4*  --   --  24.7*  --   INR 3.12  --  2.34  --   --  2.25  --   HEPARINUNFRC 0.81*  --   --   --  0.30 0.52 0.48  CREATININE 1.10  --   --   --   --  1.04  --    < > = values in this interval not displayed.    Estimated Creatinine Clearance: 73.1 mL/min (by C-G formula based on SCr of 1.04 mg/dL).   Assessment: 67 yo male with GI bleed previously on warfarin for AVR and h/o DVT PTA. Initially patient's INR 3.12 and heparin held until INR <3. Patient is now s/p EGD and MD wishes to start heparin (warfarin to be delayed for 2 days post EGD). Will use lower heparin level goal range in setting of a GI bleed and we will not bolus.   Heparin level is within goal range at 0.48. No bleeding noted.  Goal of Therapy:  Heparin level 0.3-0.5 units/ml Monitor platelets by anticoagulation protocol: Yes   Plan:  Continue heparin drip at 1050 units/hr Heparin level in 6 hours Heparin level and CBC daily Monitor CBC for Hgb drop and other s/sx of bleeding   Zarahi Fuerst A. Levada Dy, PharmD, [place] Pager: [phone]  11/26/2017 3:45 PM

## 2017-11-26 NOTE — Progress Notes (Signed)
PROGRESS NOTE    Tracy Marshall.  WYO:[id number] DOB: [birth year] DOA: 11/23/2017 PCP: Orpah Melter, MD    Brief Narrative:  67 year old male who presented with near syncope and melena. He does have significant past medical history of CVA, history of DVT, recent PFO closure, history of gastric polyps which were recently removed. He does have a mechanical aortic valve replacement 2001. He reported 2 days of dark stools, associated with dyspnea and lightheadedness. Positive lower quadrant abdominal pain. On the initial physical examination blood pressure 102/71, heart rate 82, respiration 19, oxygen saturation 100%. His lungs were clear to auscultation bilaterally, heart S1-S2 present and rhythmic, abdomen was soft nontender, no lower extremity edema. Sodium 139, potassium 4.4, chloride 1 8, bicarb 21, glucose 135, BUN 47, creatinine 1.0, white count 10.6, hemoglobin 11.3, hematocrit 34.5, platelets 295.  Patient was admitted to the hospital working diagnosis upper GI bleed.   Assessment & Plan:   Principal Problem:   Postural dizziness with presyncope Active Problems:   Long term (current) use of anticoagulants   S/P AVR (aortic valve replacement)   CVA (cerebral vascular accident) ([place])   PFO (patent foramen ovale)   Hyperlipidemia LDL goal <70   Melena   Multiple gastric polyps   Near syncope   1. Upper GI bleed sp 2 units prbc/ bleeding stigmata on one of the sites of polypectomy.  Diet has been advanced, change pantoprazole drip to pantoprazole bolus bid, no abdominal pain or signs of recurrent bleeding. Continue anticoagulation with IV heparin, plan to resume po warfarin in am. Target INR is 3. Out of bed as tolerated.   2. Aortic valve replacement. Tolerating well heparin for anticoagulation, no clinical signs of heart failure. Will resume warfarin in am, target INR to 3.   3. Dyslipidemia. On statin and fenofibrate therapy. Diet has been advanced to soft.   4.  Depression. At home take from 4 to 6 mg of diazepam, will increase dose to 4 mg in the hospital for now, continue oxcarbazepine.  DVT prophylaxis:warfarinto start in am.  Code Status:full Family Communication:I spoke with patient'swifeat the bedside and all questions were addressed.  Disposition Plan:pending egd   Consultants:  GI  Procedures:    Antimicrobials:     Subjective: Patient is feeling better, no nausea or vomiting, no chest pain or dyspnea, no hematemesis or hematochezia. Out of bed as tolerated.   Objective: Vitals:   11/25/17 1526 11/25/17 2025 11/26/17 0539 11/26/17 0802  BP: 92/61 93/64 (!) 98/59 102/66  Pulse: 61 (!) 56 (!) 48 (!) 50  Resp: 18 16 18 18   Temp: 98 F (36.7 C) 98.5 F (36.9 C) 98.1 F (36.7 C) 98 F (36.7 C)  TempSrc: Oral Oral Oral   SpO2: 98% 97% 99% 97%  Weight:  81.8 kg (180 lb 6.4 oz)    Height:        Intake/Output Summary (Last 24 hours) at 11/26/2017 1322 Last data filed at 11/26/2017 0936 Gross per 24 hour  Intake 1896.64 ml  Output 0 ml  Net 1896.64 ml   Filed Weights   11/23/17 2203 11/25/17 1053 11/25/17 2025  Weight: 83 kg (182 lb 15.7 oz) 82.6 kg (182 lb) 81.8 kg (180 lb 6.4 oz)    Examination:   General: Not in pain or dyspnea.  Neurology: Awake and alert, non focal  E ENT: mild pallor, no icterus, oral mucosa moist Cardiovascular: No JVD. S1-S2 present, rhythmic, no gallops, rubs, or murmurs. No lower extremity edema.  Pulmonary: vesicular breath sounds bilaterally, adequate air movement, no wheezing, rhonchi or rales. Gastrointestinal. Abdomen with no organomegaly, non tender, no rebound or guarding Skin. No rashes Musculoskeletal: no joint deformities     Data Reviewed: I have personally reviewed following labs and imaging studies  CBC: Recent Labs  Lab 11/23/17 1236 11/23/17 2055 11/24/17 1226 11/25/17 0507 11/25/17 1431 11/26/17 0640  WBC 10.6* 9.7 4.8 5.7 5.5 5.3    NEUTROABS 7.5  --   --   --   --  2.8  HGB 11.3* 8.3* 7.0* 9.9* 10.0* 9.2*  HCT 34.5* 25.9* 22.3* 30.1* 30.8* 28.3*  MCV 82.5 84.1 85.1 85.3 87.0 86.8  PLT 295 215 172 184 202 784   Basic Metabolic Panel: Recent Labs  Lab 11/23/17 1236 11/24/17 0226 11/26/17 0640  NA 139 142 142  K 4.4 4.2 3.4*  CL 108 113* 112*  CO2 21* 25 26  GLUCOSE 135* 92 98  BUN 47* 31* 8  CREATININE 1.00 1.10 1.04  CALCIUM 9.0 7.9* 8.5*   GFR: Estimated Creatinine Clearance: 73.1 mL/min (by C-G formula based on SCr of 1.04 mg/dL). Liver Function Tests: Recent Labs  Lab 11/23/17 1236  AST 17  ALT 16*  ALKPHOS 28*  BILITOT 0.4  PROT 6.4*  ALBUMIN 3.7   Recent Labs  Lab 11/23/17 1236  LIPASE 27   No results for input(s): AMMONIA in the last 168 hours. Coagulation Profile: Recent Labs  Lab 11/22/17 1352 11/23/17 1236 11/24/17 0226 11/25/17 0507 11/26/17 0640  INR 2.2 2.43 3.12 2.34 2.25   Cardiac Enzymes: No results for input(s): CKTOTAL, CKMB, CKMBINDEX, TROPONINI in the last 168 hours. BNP (last 3 results) No results for input(s): PROBNP in the last 8760 hours. HbA1C: No results for input(s): HGBA1C in the last 72 hours. CBG: No results for input(s): GLUCAP in the last 168 hours. Lipid Profile: No results for input(s): CHOL, HDL, LDLCALC, TRIG, CHOLHDL, LDLDIRECT in the last 72 hours. Thyroid Function Tests: No results for input(s): TSH, T4TOTAL, FREET4, T3FREE, THYROIDAB in the last 72 hours. Anemia Panel: No results for input(s): VITAMINB12, FOLATE, FERRITIN, TIBC, IRON, RETICCTPCT in the last 72 hours.    Radiology Studies: I have reviewed all of the imaging during this hospital visit personally     Scheduled Meds: . fenofibrate  160 mg Oral Daily  . ferrous sulfate  325 mg Oral BID WC  . fluticasone  2 spray Each Nare Daily  . multivitamin with minerals  1 tablet Oral Daily  . OXcarbazepine  300 mg Oral BID  . pantoprazole  40 mg Intravenous Q12H  .  pravastatin  40 mg Oral Daily  . pregabalin  300 mg Oral BID   Continuous Infusions: . heparin 1,050 Units/hr (11/26/17 1122)     LOS: 3 days        Mauricio Gerome Apley, MD [facility] Pager [phone]

## 2017-11-26 NOTE — Progress Notes (Signed)
ANTICOAGULATION CONSULT NOTE  Pharmacy Consult for Heparin Indication: AVR, h/o DVT, and PFO closure  No Known Allergies  Patient Measurements: Height: 5\' 10"  (177.8 cm) Weight: 180 lb 6.4 oz (81.8 kg) IBW/kg (Calculated) : 73   Vital Signs: Temp: 98 F (36.7 C) (06/07 0802) Temp Source: Oral (06/07 0539) BP: 102/66 (06/07 0802) Pulse Rate: 50 (06/07 0802)  Labs: Recent Labs    11/23/17 1236  11/24/17 0226  11/25/17 0507 11/25/17 1431 11/25/17 1946 11/26/17 0640  HGB 11.3*   < >  --    < > 9.9* 10.0*  --  9.2*  HCT 34.5*   < >  --    < > 30.1* 30.8*  --  28.3*  PLT 295   < >  --    < > 184 202  --  175  LABPROT 26.2*  --  31.8*  --  25.4*  --   --  24.7*  INR 2.43  --  3.12  --  2.34  --   --  2.25  HEPARINUNFRC  --   --  0.81*  --   --   --  0.30 0.52  CREATININE 1.00  --  1.10  --   --   --   --  1.04   < > = values in this interval not displayed.    Estimated Creatinine Clearance: 73.1 mL/min (by C-G formula based on SCr of 1.04 mg/dL).   Assessment: 67 yo male with GI bleed previously on warfarin for AVR and h/o DVT PTA. Initially patient's INR 3.12 and heparin held until INR <3. Patient is now s/p EGD and MD wishes to start heparin (warfarin to be delayed for 2 days post EGD). Will use lower heparin level goal range in setting of a GI bleed and we will not bolus.   Heparin level is above goal range at 0.52. No bleeding noted.Hgb slightly lower at 9.2 and INR still >2 at 2.25. Will decrease drip rate to keep heparin level within goal range.  Goal of Therapy:  Heparin level 0.3-0.5 units/ml Monitor platelets by anticoagulation protocol: Yes   Plan:  Increase heparin drip to 1050 units/hr Heparin level in 6 hours Heparin level and CBC daily Monitor CBC for Hgb drop and other s/sx of bleeding   Jesse Nosbisch A. Levada Dy, PharmD, [place] Pager: [phone]  11/26/2017 8:18 AM

## 2017-11-26 NOTE — Progress Notes (Signed)
[facility] Progress Note  Tracy Marshall. 67 y.o. Apr 07, 1951   Subjective: No bleeding overnight. Feels ok. Wife at bedside.  Objective: Vital signs: Vitals:   11/26/17 0539 11/26/17 0802  BP: (!) 98/59 102/66  Pulse: (!) 48 (!) 50  Resp: 18 18  Temp: 98.1 F (36.7 C) 98 F (36.7 C)  SpO2: 99% 97%    Physical Exam: Gen: alert, no acute distress  HEENT: anicteric sclera CV: RRR Chest: CTA B Abd: soft, nontender, nondistended, +BS   Lab Results: Recent Labs    11/24/17 0226 11/26/17 0640  NA 142 142  K 4.2 3.4*  CL 113* 112*  CO2 25 26  GLUCOSE 92 98  BUN 31* 8  CREATININE 1.10 1.04  CALCIUM 7.9* 8.5*   Recent Labs    11/23/17 1236  AST 17  ALT 16*  ALKPHOS 28*  BILITOT 0.4  PROT 6.4*  ALBUMIN 3.7   Recent Labs    11/23/17 1236  11/25/17 1431 11/26/17 0640  WBC 10.6*   < > 5.5 5.3  NEUTROABS 7.5  --   --  2.8  HGB 11.3*   < > 10.0* 9.2*  HCT 34.5*   < > 30.8* 28.3*  MCV 82.5   < > 87.0 86.8  PLT 295   < > 202 175   < > = values in this interval not displayed.      Assessment/Plan: S/P Upper GI bleed from gastric polypectomy site - s/p epi injection yesterday to try and prevent further bleeding. Increased risk for further bleeding due to long-term anticoagulation. On IV Heparin and ok to resume Coumadin tomorrow if continues to be stable. Hgb 9.2 without signs of ongoing bleeding. Change to IV PPI Q 12 hours when Protonix drip completes this afternoon. Soft diet and advance as tolerated to heart healthy diet. Wife asked if any dietary restrictions when he leaves the hospital and recommended no alcohol for 3 weeks. Dr. Paulita Fujita to see tomorrow.   Troy C. 11/26/2017, 11:31 AM  Questions please call [phone] ID: Tracy Marshall., male   DOB: [birth year], 67 y.o.   MRN: [id number]

## 2017-11-27 DIAGNOSIS — I639 Cerebral infarction, unspecified: Secondary | ICD-10-CM

## 2017-11-27 LAB — HEPARIN LEVEL (UNFRACTIONATED)
HEPARIN UNFRACTIONATED: 0.57 [IU]/mL (ref 0.30–0.70)
HEPARIN UNFRACTIONATED: 0.53 [IU]/mL (ref 0.30–0.70)

## 2017-11-27 LAB — PROTIME-INR
INR: 1.85
Prothrombin Time: 21.2 seconds — ABNORMAL HIGH (ref 11.4–15.2)

## 2017-11-27 MED ORDER — WARFARIN SODIUM 3 MG PO TABS
3.0000 mg | ORAL_TABLET | Freq: Once | ORAL | Status: AC
  Administered 2017-11-27: 3 mg via ORAL
  Filled 2017-11-27: qty 1

## 2017-11-27 MED ORDER — PANTOPRAZOLE SODIUM 40 MG PO TBEC
40.0000 mg | DELAYED_RELEASE_TABLET | Freq: Every day | ORAL | Status: DC
  Administered 2017-11-27 – 2017-11-28 (×2): 40 mg via ORAL
  Filled 2017-11-27 (×2): qty 1

## 2017-11-27 MED ORDER — WARFARIN - PHARMACIST DOSING INPATIENT: Freq: Every day | Status: DC

## 2017-11-27 NOTE — Progress Notes (Signed)
ANTICOAGULATION CONSULT NOTE - Initial Consult  Pharmacy Consult for warfarin Indication: Mechanical valve  No Known Allergies  Patient Measurements: Height: 5\' 10"  (177.8 cm) Weight: 179 lb 3.2 oz (81.3 kg) IBW/kg (Calculated) : 73  Vital Signs: Temp: 97.7 F (36.5 C) (06/08 0900) Temp Source: Oral (06/08 0900) BP: 124/69 (06/08 0900) Pulse Rate: 54 (06/08 0900)  Labs: Recent Labs    11/25/17 0507 11/25/17 1431  11/26/17 0640 11/26/17 1430 11/27/17 0515  HGB 9.9* 10.0*  --  9.2*  --   --   HCT 30.1* 30.8*  --  28.3*  --   --   PLT 184 202  --  175  --   --   LABPROT 25.4*  --   --  24.7*  --  21.2*  INR 2.34  --   --  2.25  --  1.85  HEPARINUNFRC  --   --    < > 0.52 0.48 0.57  CREATININE  --   --   --  1.04  --   --    < > = values in this interval not displayed.    Estimated Creatinine Clearance: 73.1 mL/min (by C-G formula based on SCr of 1.04 mg/dL).   Medical History: Past Medical History:  Diagnosis Date  . Anemia    iron  . Arthritis   . Atypical moles    melanomna  . Basal cell carcinoma   . Bilateral carotid artery stenosis 08/03/2014   1-39% right and 40-59% left carotid artery stenosis  . Chronic anticoagulation    for mechanical AVR  . Cluster headaches   . CVA (cerebral vascular accident) ([place]) 07/24/2014    MRI indicating 2 punctate foci of acute infarction.  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   . Diverticulosis   . Dizziness   . Episodic recurrent vertigo 2002 & 2005   carotid dopplers w no clinically significant stenosis  . Fatty liver    hx of elevated hepatic transaminases-negative workup in 2009 except for U/S suggesting fatty liver  . GERD (gastroesophageal reflux disease)   . Gout   . Heart murmur   . Hyperlipidemia   . Hyperlipidemia LDL goal <70 12/05/2015  . Joint pain   . LBBB (left bundle branch block)   . Melanoma ([place])    hx of melanoma and multiple  basal cell carcinomas Dr Tonia Brooms  . PFO (patent foramen ovale) 12/05/2015  . Positive ANA (antinuclear antibody)   . S/P AVR (aortic valve replacement)    mechanical  . TIA (transient ischemic attack)     Medications:  Scheduled:  . fenofibrate  160 mg Oral Daily  . ferrous sulfate  325 mg Oral BID WC  . fluticasone  2 spray Each Nare Daily  . multivitamin with minerals  1 tablet Oral Daily  . OXcarbazepine  300 mg Oral BID  . pantoprazole  40 mg Oral Daily  . pravastatin  40 mg Oral Daily  . pregabalin  300 mg Oral BID    Assessment: 67 yo male with GI bleed previously on warfarin mechanical AVR and h/o DVT PTA. Initially patient's INR 3.12 and heparin held until INR <3. Patient is now s/p EGD, Primary and GI wish to restart warfarin.  Home dose is 3mg  daily except Mon, Wed, Fri where he takes 1.5mg .  Higher INR goal of 3-3.5 per [facility] notes who manage INR outpt.  Acute blood loss anemia stable, no ongoing bleeding  per GI.    Goal of Therapy:  INR 3-3.5 Monitor platelets by anticoagulation protocol: Yes   Plan:  Give warfarin 3mg  tablet PO x 1 tonight Monitor daily INR, CBC, s/s bleeding  Bertis Ruddy, PharmD Pharmacy Resident [phone] 11/27/2017 2:44 PM

## 2017-11-27 NOTE — Progress Notes (Signed)
ANTICOAGULATION CONSULT NOTE  Pharmacy Consult for Heparin Indication: AVR, h/o DVT, and PFO closure  No Known Allergies  Patient Measurements: Height: 5\' 10"  (177.8 cm) Weight: 179 lb 3.2 oz (81.3 kg) IBW/kg (Calculated) : 73   Vital Signs: Temp: 98.3 F (36.8 C) (06/08 0404) Temp Source: Oral (06/08 0404) BP: 121/67 (06/08 0404) Pulse Rate: 55 (06/08 0404)  Labs: Recent Labs    11/25/17 0507 11/25/17 1431  11/26/17 0640 11/26/17 1430 11/27/17 0515  HGB 9.9* 10.0*  --  9.2*  --   --   HCT 30.1* 30.8*  --  28.3*  --   --   PLT 184 202  --  175  --   --   LABPROT 25.4*  --   --  24.7*  --  21.2*  INR 2.34  --   --  2.25  --  1.85  HEPARINUNFRC  --   --    < > 0.52 0.48 0.57  CREATININE  --   --   --  1.04  --   --    < > = values in this interval not displayed.    Estimated Creatinine Clearance: 73.1 mL/min (by C-G formula based on SCr of 1.04 mg/dL).   Assessment: 68 yo male with GI bleed previously on warfarin for AVR and h/o DVT PTA. Initially patient's INR 3.12 and heparin held until INR <3. Patient is now s/p EGD and MD wishes to start heparin (warfarin to be delayed for 2 days post EGD). Will use lower heparin level goal range in setting of a GI bleed and we will not bolus.   Heparin level slightly supratherapeutic at 0.57 with lowered goal of 0.3-0.5, CBC low but stable, no bleeding reported.  Goal of Therapy:  Heparin level 0.3-0.5 units/ml Monitor platelets by anticoagulation protocol: Yes   Plan:  Decrease heparin gtt to 950 units/hr F/u 6h heparin level at 1500 F/u plan to resume warfarin  Monitor daily heparin level, CBC, s/s bleeding  Bertis Ruddy, PharmD Pharmacy Resident [phone] 11/27/2017 9:08 AM

## 2017-11-27 NOTE — Progress Notes (Addendum)
PROGRESS NOTE    Tracy Marshall.  RWE:[id number] DOB: 02-01-1951 DOA: 11/23/2017 PCP: Orpah Melter, MD    Brief Narrative:  67 year old male who presented with near syncope and melena. He does have significant past medical history of CVA, history of DVT, recent PFO closure, history of gastric polyps which were recently removed. He does have a mechanical aortic valve replacement 2001. He reported 2 days of dark stools, associated with dyspnea and lightheadedness. Positive lower quadrant abdominal pain. On the initial physical examination blood pressure 102/71, heart rate 82, respiration 19, oxygen saturation 100%. His lungs were clear to auscultation bilaterally, heart S1-S2 present and rhythmic, abdomen was soft nontender, no lower extremity edema. Sodium 139, potassium 4.4, chloride 1 8, bicarb 21, glucose 135, BUN 47, creatinine 1.0, white count 10.6, hemoglobin 11.3, hematocrit 34.5, platelets 295.  Patient was admitted to the hospital working diagnosis upper GI bleed.   Assessment & Plan:   Principal Problem:   Postural dizziness with presyncope Active Problems:   Long term (current) use of anticoagulants   S/P AVR (aortic valve replacement)   CVA (cerebral vascular accident) ([place])   PFO (patent foramen ovale)   Hyperlipidemia LDL goal <70   Melena   Multiple gastric polyps   Near syncope   1. Upper GI bleed with acute blood loss anemia, sp 2 units prbc/ bleeding stigmata on one of the sites of polypectomy. Will change to oral antiacid therapy, patient tolerating po well with no signs of bleeding, will resume warfarin today and plan to discharge patient home in am. Continue heparin drip for bridge anticoagulation. Out of bed as tolerated.   2. Aortic valve replacement.Resume warfarin today per pharmacy protocol, follow INR in am. Continue IV heparin for now, with plan to continue anticoagulation bridging with enoxaparin as outpatient.   3. Dyslipidemia. Congtinue  statinand fenofibrate, with good toleration.  4. Depression. Tolerating well 4 mg ofdiazepam, and continue with oxcarbazepine.  DVT prophylaxis:warfarinto start in am.  Code Status:full Family Communication:I spoke with patient'swifeat the bedside and all questions were addressed.  Disposition Plan:pending egd   Consultants:  GI  Procedures:    Antimicrobials:     Subjective: Patient is feeling better, tolerating po well, no nausea or vomiting, no hematochezia or hematemesis, no melena. No dyspnea or chest pain.   Objective: Vitals:   11/26/17 1616 11/26/17 2030 11/27/17 0404 11/27/17 0900  BP: (!) 96/58 95/68 121/67 124/69  Pulse: (!) 53 (!) 59 (!) 55 (!) 54  Resp: 18 18 20 20   Temp: 98 F (36.7 C) 98.8 F (37.1 C) 98.3 F (36.8 C) 97.7 F (36.5 C)  TempSrc: Oral Oral Oral Oral  SpO2: 97% 100% 98% 100%  Weight:  81.3 kg (179 lb 3.2 oz)    Height:        Intake/Output Summary (Last 24 hours) at 11/27/2017 1409 Last data filed at 11/27/2017 0600 Gross per 24 hour  Intake 0 ml  Output 0 ml  Net 0 ml   Filed Weights   11/25/17 1053 11/25/17 2025 11/26/17 2030  Weight: 82.6 kg (182 lb) 81.8 kg (180 lb 6.4 oz) 81.3 kg (179 lb 3.2 oz)    Examination:   General: Not in pain or dyspnea.  Neurology: Awake and alert, non focal  E ENT: mild pallor, no icterus, oral mucosa moist Cardiovascular: No JVD. Y1-P5 (mechanical click) present, rhythmic, no gallops, rubs, or murmurs. No lower extremity edema. Pulmonary: vesicular breath sounds bilaterally, adequate air movement, no wheezing, rhonchi  or rales. Gastrointestinal. Abdomen with no organomegaly, non tender, no rebound or guarding Skin. No rashes Musculoskeletal: no joint deformities     Data Reviewed: I have personally reviewed following labs and imaging studies  CBC: Recent Labs  Lab 11/23/17 1236 11/23/17 2055 11/24/17 1226 11/25/17 0507 11/25/17 1431 11/26/17 0640  WBC  10.6* 9.7 4.8 5.7 5.5 5.3  NEUTROABS 7.5  --   --   --   --  2.8  HGB 11.3* 8.3* 7.0* 9.9* 10.0* 9.2*  HCT 34.5* 25.9* 22.3* 30.1* 30.8* 28.3*  MCV 82.5 84.1 85.1 85.3 87.0 86.8  PLT 295 215 172 184 202 203   Basic Metabolic Panel: Recent Labs  Lab 11/23/17 1236 11/24/17 0226 11/26/17 0640  NA 139 142 142  K 4.4 4.2 3.4*  CL 108 113* 112*  CO2 21* 25 26  GLUCOSE 135* 92 98  BUN 47* 31* 8  CREATININE 1.00 1.10 1.04  CALCIUM 9.0 7.9* 8.5*   GFR: Estimated Creatinine Clearance: 73.1 mL/min (by C-G formula based on SCr of 1.04 mg/dL). Liver Function Tests: Recent Labs  Lab 11/23/17 1236  AST 17  ALT 16*  ALKPHOS 28*  BILITOT 0.4  PROT 6.4*  ALBUMIN 3.7   Recent Labs  Lab 11/23/17 1236  LIPASE 27   No results for input(s): AMMONIA in the last 168 hours. Coagulation Profile: Recent Labs  Lab 11/23/17 1236 11/24/17 0226 11/25/17 0507 11/26/17 0640 11/27/17 0515  INR 2.43 3.12 2.34 2.25 1.85   Cardiac Enzymes: No results for input(s): CKTOTAL, CKMB, CKMBINDEX, TROPONINI in the last 168 hours. BNP (last 3 results) No results for input(s): PROBNP in the last 8760 hours. HbA1C: No results for input(s): HGBA1C in the last 72 hours. CBG: No results for input(s): GLUCAP in the last 168 hours. Lipid Profile: No results for input(s): CHOL, HDL, LDLCALC, TRIG, CHOLHDL, LDLDIRECT in the last 72 hours. Thyroid Function Tests: No results for input(s): TSH, T4TOTAL, FREET4, T3FREE, THYROIDAB in the last 72 hours. Anemia Panel: No results for input(s): VITAMINB12, FOLATE, FERRITIN, TIBC, IRON, RETICCTPCT in the last 72 hours.    Radiology Studies: I have reviewed all of the imaging during this hospital visit personally     Scheduled Meds: . fenofibrate  160 mg Oral Daily  . ferrous sulfate  325 mg Oral BID WC  . fluticasone  2 spray Each Nare Daily  . multivitamin with minerals  1 tablet Oral Daily  . OXcarbazepine  300 mg Oral BID  . pantoprazole  40 mg Oral  Daily  . pravastatin  40 mg Oral Daily  . pregabalin  300 mg Oral BID   Continuous Infusions: . heparin 950 Units/hr (11/27/17 1239)     LOS: 4 days        Mandela Bello Gerome Apley, MD [facility] Pager [phone]

## 2017-11-27 NOTE — Progress Notes (Signed)
Subjective: No abdominal pain.  No bleeding. Tolerating diet.  Objective: Vital signs in last 24 hours: Temp:  [98 F (36.7 C)-98.8 F (37.1 C)] 98.3 F (36.8 C) (06/08 0404) Pulse Rate:  [53-59] 55 (06/08 0404) Resp:  [18-20] 20 (06/08 0404) BP: (95-121)/(58-68) 121/67 (06/08 0404) SpO2:  [97 %-100 %] 98 % (06/08 0404) Weight:  [81.3 kg (179 lb 3.2 oz)] 81.3 kg (179 lb 3.2 oz) (06/07 2030) Weight change: -1.27 kg (-2 lb 12.8 oz) Last BM Date: 11/24/17  PE: GEN:  Pale-appearing, NAD ABD:  Soft, non-tender  Lab Results: CBC    Component Value Date/Time   WBC 5.3 11/26/2017 0640   RBC 3.26 (L) 11/26/2017 0640   HGB 9.2 (L) 11/26/2017 0640   HGB 14.9 11/11/2017 1535   HCT 28.3 (L) 11/26/2017 0640   HCT 45.4 11/11/2017 1535   PLT 175 11/26/2017 0640   PLT 285 11/11/2017 1535   MCV 86.8 11/26/2017 0640   MCV 82 11/11/2017 1535   MCH 28.2 11/26/2017 0640   MCHC 32.5 11/26/2017 0640   RDW 18.0 (H) 11/26/2017 0640   RDW 24.9 (H) 11/11/2017 1535   LYMPHSABS 1.8 11/26/2017 0640   LYMPHSABS 1.6 11/11/2017 1535   MONOABS 0.5 11/26/2017 0640   EOSABS 0.1 11/26/2017 0640   EOSABS 0.0 11/11/2017 1535   BASOSABS 0.0 11/26/2017 0640   BASOSABS 0.0 11/11/2017 1535   CMP     Component Value Date/Time   NA 142 11/26/2017 0640   NA 140 11/11/2017 1535   K 3.4 (L) 11/26/2017 0640   CL 112 (H) 11/26/2017 0640   CO2 26 11/26/2017 0640   GLUCOSE 98 11/26/2017 0640   BUN 8 11/26/2017 0640   BUN 20 11/11/2017 1535   CREATININE 1.04 11/26/2017 0640   CREATININE 1.05 12/17/2015 1007   CALCIUM 8.5 (L) 11/26/2017 0640   PROT 6.4 (L) 11/23/2017 1236   ALBUMIN 3.7 11/23/2017 1236   AST 17 11/23/2017 1236   ALT 16 (L) 11/23/2017 1236   ALKPHOS 28 (L) 11/23/2017 1236   BILITOT 0.4 11/23/2017 1236   GFRNONAA >60 11/26/2017 0640   GFRAA >60 11/26/2017 0640   Assessment:  1.  Melena, likely from bleeding polypectomy sites in stomach.  No ongoing bleeding. 2.  Acute blood loss  anemia.  Hgb fairly stable; no ongoing bleeding. 3.  Chronic anticoagulation.  Plan:  1.  OK to start back warfarin from GI standpoint. 2.  Advance diet as tolerated. 3.  Oral PPI, now and upon discharge. 4.  [facility] will sign-off; patient can follow-up with Dr. Penelope Coop in [place] as outpatient; thanks for the consult; please call with questions.   Landry Dyke 11/27/2017, 9:37 AM   Cell [phone] If no answer or after 5 PM call [phone]

## 2017-11-28 LAB — CBC WITH DIFFERENTIAL/PLATELET
Abs Immature Granulocytes: 0.1 10*3/uL (ref 0.0–0.1)
BASOS ABS: 0 10*3/uL (ref 0.0–0.1)
Basophils Relative: 0 %
Eosinophils Absolute: 0.2 10*3/uL (ref 0.0–0.7)
Eosinophils Relative: 3 %
HCT: 32.8 % — ABNORMAL LOW (ref 39.0–52.0)
HEMOGLOBIN: 10.6 g/dL — AB (ref 13.0–17.0)
Immature Granulocytes: 1 %
LYMPHS ABS: 1.9 10*3/uL (ref 0.7–4.0)
LYMPHS PCT: 33 %
MCH: 28.3 pg (ref 26.0–34.0)
MCHC: 32.3 g/dL (ref 30.0–36.0)
MCV: 87.5 fL (ref 78.0–100.0)
MONO ABS: 0.6 10*3/uL (ref 0.1–1.0)
MONOS PCT: 10 %
NEUTROS ABS: 3 10*3/uL (ref 1.7–7.7)
Neutrophils Relative %: 53 %
Platelets: 253 10*3/uL (ref 150–400)
RBC: 3.75 MIL/uL — ABNORMAL LOW (ref 4.22–5.81)
RDW: 17.6 % — ABNORMAL HIGH (ref 11.5–15.5)
WBC: 5.7 10*3/uL (ref 4.0–10.5)

## 2017-11-28 LAB — HEPARIN LEVEL (UNFRACTIONATED): Heparin Unfractionated: 0.44 IU/mL (ref 0.30–0.70)

## 2017-11-28 LAB — PROTIME-INR
INR: 1.5
Prothrombin Time: 18 seconds — ABNORMAL HIGH (ref 11.4–15.2)

## 2017-11-28 MED ORDER — ENOXAPARIN SODIUM 120 MG/0.8ML ~~LOC~~ SOLN
120.0000 mg | SUBCUTANEOUS | Status: DC
  Administered 2017-11-28: 120 mg via SUBCUTANEOUS
  Filled 2017-11-28: qty 0.8

## 2017-11-28 MED ORDER — ENOXAPARIN SODIUM 120 MG/0.8ML ~~LOC~~ SOLN: 120.0000 mg | SUBCUTANEOUS | 0 refills | Status: DC

## 2017-11-28 MED ORDER — ENOXAPARIN SODIUM 120 MG/0.8ML ~~LOC~~ SOLN
1.5000 mg/kg | SUBCUTANEOUS | Status: DC
  Filled 2017-11-28: qty 0.81

## 2017-11-28 NOTE — Progress Notes (Signed)
ANTICOAGULATION CONSULT NOTE - Initial Consult  Pharmacy Consult for warfarin/lovenox Indication: Mechanical valve  No Known Allergies  Patient Measurements: Height: 5\' 10"  (177.8 cm) Weight: 179 lb 3.2 oz (81.3 kg) IBW/kg (Calculated) : 73  Vital Signs: Temp: 98.5 F (36.9 C) (06/09 0926) Temp Source: Oral (06/09 0926) BP: 108/71 (06/09 0926) Pulse Rate: 59 (06/09 0926)  Labs: Recent Labs    11/25/17 1431  11/26/17 0640  11/27/17 0515 11/27/17 1444 11/28/17 0732  HGB 10.0*  --  9.2*  --   --   --  10.6*  HCT 30.8*  --  28.3*  --   --   --  32.8*  PLT 202  --  175  --   --   --  253  LABPROT  --   --  24.7*  --  21.2*  --  18.0*  INR  --   --  2.25  --  1.85  --  1.50  HEPARINUNFRC  --    < > 0.52   < > 0.57 0.53 0.44  CREATININE  --   --  1.04  --   --   --   --    < > = values in this interval not displayed.    Estimated Creatinine Clearance: 73.1 mL/min (by C-G formula based on SCr of 1.04 mg/dL).   Medical History: Past Medical History:  Diagnosis Date  . Anemia    iron  . Arthritis   . Atypical moles    melanomna  . Basal cell carcinoma   . Bilateral carotid artery stenosis 08/03/2014   1-39% right and 40-59% left carotid artery stenosis  . Chronic anticoagulation    for mechanical AVR  . Cluster headaches   . CVA (cerebral vascular accident) ([place]) 07/24/2014    MRI indicating 2 punctate foci of acute infarction.  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   . Diverticulosis   . Dizziness   . Episodic recurrent vertigo 2002 & 2005   carotid dopplers w no clinically significant stenosis  . Fatty liver    hx of elevated hepatic transaminases-negative workup in 2009 except for U/S suggesting fatty liver  . GERD (gastroesophageal reflux disease)   . Gout   . Heart murmur   . Hyperlipidemia   . Hyperlipidemia LDL goal <70 12/05/2015  . Joint pain   . LBBB (left bundle branch block)   .  Melanoma ([place])    hx of melanoma and multiple basal cell carcinomas Dr Tonia Brooms  . PFO (patent foramen ovale) 12/05/2015  . Positive ANA (antinuclear antibody)   . S/P AVR (aortic valve replacement)    mechanical  . TIA (transient ischemic attack)     Medications:  Scheduled:  . fenofibrate  160 mg Oral Daily  . ferrous sulfate  325 mg Oral BID WC  . fluticasone  2 spray Each Nare Daily  . multivitamin with minerals  1 tablet Oral Daily  . OXcarbazepine  300 mg Oral BID  . pantoprazole  40 mg Oral Daily  . pravastatin  40 mg Oral Daily  . pregabalin  300 mg Oral BID  . Warfarin - Pharmacist Dosing Inpatient   Does not apply q1800    Assessment: 67 yo male with GI bleed previously on warfarin mechanical AVR and h/o DVT PTA. Initially patient's INR 3.12 and heparin held until INR <3. Patient is now s/p EGD, Primary and GI wish to restart warfarin.  Home dose  is 3mg  daily except Mon, Wed, Fri where he takes 1.5mg .  Higher INR goal of 3-3.5 per [facility] notes who manage INR outpt.   Acute blood loss anemia stable, no ongoing bleeding per GI.    Goal of Therapy:  INR 3-3.5 Monitor platelets by anticoagulation protocol: Yes   Plan:  D/c heparin gtt, and give lovenox 120mg  subQ (1.5mg /kg) every 24 hours  No coumadin order for tonight as patient is discharging today  Bertis Ruddy, PharmD Pharmacy Resident [phone] 11/28/2017 10:24 AM

## 2017-11-28 NOTE — Discharge Summary (Signed)
Physician Discharge Summary  [name] MEQ:[id number] DOB: 1950/11/20 DOA: 11/23/2017  PCP: Orpah Melter, MD  Admit date: 11/23/2017 Discharge date: 11/28/2017  Admitted From: Home  Disposition:  Home   Recommendations for Outpatient Follow-up and new medication changes:  1. Follow up with Dr. Orpah Melter in one week.  2. Continue enoxaparin bridging until INR above 3, follow with the warfarin clinic as outpatient in 48 hours.  Target INR 3-3.5  Home Health: no   Equipment/Devices: no    Discharge Condition: stable  CODE STATUS: full  Diet recommendation: Heart healthy   Brief/Interim Summary: 67 year old male who presented with near syncope and melena. He does have the significant past medical history of CVA, history of DVT, recent PFO closure, history of gastric polyps which were recently removed. He does have a mechanical aortic valve replacement 2001. He reported 2 days of dark stools, associated with dyspnea and lightheadedness. Positive lower quadrant abdominal pain. On the initial physical examination blood pressure 102/71, heart rate 82, respiratory rate 19, oxygen saturation 100%. His lungs were clear to auscultation bilaterally, heart S1-S2 present and rhythmic, abdomen was soft and nontender, no lower extremity edema. Sodium 139, potassium 4.4, chloride 1 8, bicarb 21, glucose 135, BUN 47, creatinine 1.0, white count 10.6, hemoglobin 11.3, hematocrit 34.5, platelets 295.  Patient was admitted to the hospital with the working diagnosis upper GI bleed.  1. Upper GI bleed with acute blood loss anemia. Sp 2 units prbc transfusion/ bleeding at polypectomy site.  Patient was admitted to the medical ward, he was placed on a remote telemetry monitor, received proton pump inhibitor infusion with IV pantoprazole.  He developed worsening anemia, with a nadir hemoglobin down to 7.0 with hematocrit 22.3, he required 2 units of packed red blood cells transfusion and his  discharge hemoglobin is 10.6, hematocrit 32.8.  He underwent upper endoscopy showing healing gastric polypectomy sites but bleeding stigmata seen in 1 of the sites that was not Hemoclip on prior procedure.  EPI/saline submucosally injected with good blanching.  No further signs of bleeding.  Anticoagulation was resumed with no major complications.  2.  Status post aortic mechanical valve replacement 2001 due to bicuspid aortic stenosis.  Patient does have a mechanical valve, he received anticoagulation with IV heparin and warfarin was held.  His gastric bleeding resolved and he was resumed on warfarin on June 8.  He will be discharged with therapeutic doses of enoxaparin, follow-up INR in 48 hours.  Target INR 3-3.5  3.  History of embolic CVAs.  Recurrent embolic CVAs, patient was found to have DVT in April 2018 patent foramen ovale was closed to prevent further embolic events.  Continue anticoagulation with warfarin target INR 3-3.5.  Continue aspirin 81 mg daily.  4.  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  Right mid femoral vein and right above-the-knee popliteal vein.  Continue anticoagulation, likely will need lifelong therapy.   5.  Dyslipidemia.  Continue fenofibrate and pravastatin  6.  Depression.  Continue diazepam and oxcarbazepine, no major complications.  Discharge Diagnoses:  Principal Problem:   Postural dizziness with presyncope Active Problems:   Long term (current) use of anticoagulants   S/P AVR (aortic valve replacement)   CVA (cerebral vascular accident) ([place])   PFO (patent foramen ovale)   Hyperlipidemia LDL goal <70   Melena   Multiple gastric polyps   Near syncope    Discharge Instructions   Allergies as of 11/28/2017   No Known Allergies  Medication List    STOP taking these medications   enoxaparin 80 MG/0.8ML injection Commonly known as:  LOVENOX Replaced by:  enoxaparin 120 MG/0.8ML injection     TAKE these medications   acetaminophen 500 MG  tablet Commonly known as:  TYLENOL Take 1,000 mg by mouth every 6 (six) hours as needed for moderate pain.   aspirin 81 MG tablet Take 81 mg by mouth daily.   colchicine 0.6 MG tablet Take 0.6 mg by mouth daily as needed (gout flares).   diazepam 2 MG tablet Commonly known as:  VALIUM Take 2 mg by mouth at bedtime as needed (for sleep).   enoxaparin 120 MG/0.8ML injection Commonly known as:  LOVENOX Inject 0.8 mLs (120 mg total) into the skin daily for 8 days. Replaces:  enoxaparin 80 MG/0.8ML injection   fenofibrate 145 MG tablet Commonly known as:  TRICOR Take 1 tablet (145 mg total) by mouth daily.   ferrous sulfate 325 (65 FE) MG tablet Take 325 mg by mouth 2 (two) times daily with a meal.   fluticasone 50 MCG/ACT nasal spray Commonly known as:  FLONASE Place 2 sprays into both nostrils daily.   HYDROcodone-acetaminophen 10-325 MG tablet Commonly known as:  NORCO Take 1 tablet by mouth daily as needed (for back pain).   hydrocortisone cream 1 % Apply 1 application topically 2 (two) times daily as needed for itching.   LYRICA 150 MG capsule Generic drug:  pregabalin Take 300 mg by mouth 2 (two) times daily.   ONE-A-DAY MENS 50+ ADVANTAGE Tabs Take 1 tablet by mouth daily.   OXcarbazepine 150 MG tablet Commonly known as:  TRILEPTAL Take 300 mg by mouth 2 (two) times daily.   pantoprazole 40 MG tablet Commonly known as:  PROTONIX Take 40 mg by mouth daily.   pravastatin 40 MG tablet Commonly known as:  PRAVACHOL Take 1 tablet (40 mg total) by mouth daily.   ranitidine 75 MG tablet Commonly known as:  ZANTAC Take 75 mg by mouth daily as needed for heartburn.   VITAMIN C PO Take 1 tablet by mouth daily.   warfarin 3 MG tablet Commonly known as:  COUMADIN Take as directed. If you are unsure how to take this medication, talk to your nurse or doctor. Original instructions:  TAKE AS DIRECTED BY COUMADIN CLINIC What changed:  See the new instructions.        No Known Allergies  Consultations:  GI   Procedures/Studies:  No results found.    Subjective: Patient is feeling better, tolerating po well, no nausea or vomiting, no melena or hematochezia.   Discharge Exam: Vitals:   11/28/17 0515 11/28/17 0926  BP: 111/68 108/71  Pulse: (!) 59 (!) 59  Resp: 20 16  Temp: 97.9 F (36.6 C) 98.5 F (36.9 C)  SpO2: 99% (!) 87%   Vitals:   11/27/17 1800 11/27/17 2015 11/28/17 0515 11/28/17 0926  BP: 110/74 104/67 111/68 108/71  Pulse: (!) 52 (!) 55 (!) 59 (!) 59  Resp: 20 20 20 16   Temp: 97.7 F (36.5 C) 98.8 F (37.1 C) 97.9 F (36.6 C) 98.5 F (36.9 C)  TempSrc: Oral Oral Oral Oral  SpO2: 97% 97% 99% (!) 87%  Weight:      Height:        General: Not in pain or dyspnea  Neurology: Awake and alert, non focal  E ENT: no pallor, no icterus, oral mucosa moist Cardiovascular: No JVD. S1-S2 (mechanocal clinic) present, rhythmic, no gallops,  rubs, or murmurs. No lower extremity edema. Pulmonary: vesicular breath sounds bilaterally, adequate air movement, no wheezing, rhonchi or rales. Gastrointestinal. Abdomen with no organomegaly, non tender, no rebound or guarding Skin. No rashes Musculoskeletal: no joint deformities   The results of significant diagnostics from this hospitalization (including imaging, microbiology, ancillary and laboratory) are listed below for reference.     Microbiology: No results found for this or any previous visit (from the past 240 hour(s)).   Labs: BNP (last 3 results) No results for input(s): BNP in the last 8760 hours. Basic Metabolic Panel: Recent Labs  Lab 11/23/17 1236 11/24/17 0226 11/26/17 0640  NA 139 142 142  K 4.4 4.2 3.4*  CL 108 113* 112*  CO2 21* 25 26  GLUCOSE 135* 92 98  BUN 47* 31* 8  CREATININE 1.00 1.10 1.04  CALCIUM 9.0 7.9* 8.5*   Liver Function Tests: Recent Labs  Lab 11/23/17 1236  AST 17  ALT 16*  ALKPHOS 28*  BILITOT 0.4  PROT 6.4*  ALBUMIN 3.7    Recent Labs  Lab 11/23/17 1236  LIPASE 27   No results for input(s): AMMONIA in the last 168 hours. CBC: Recent Labs  Lab 11/23/17 1236  11/24/17 1226 11/25/17 0507 11/25/17 1431 11/26/17 0640 11/28/17 0732  WBC 10.6*   < > 4.8 5.7 5.5 5.3 5.7  NEUTROABS 7.5  --   --   --   --  2.8 3.0  HGB 11.3*   < > 7.0* 9.9* 10.0* 9.2* 10.6*  HCT 34.5*   < > 22.3* 30.1* 30.8* 28.3* 32.8*  MCV 82.5   < > 85.1 85.3 87.0 86.8 87.5  PLT 295   < > 172 184 202 175 253   < > = values in this interval not displayed.   Cardiac Enzymes: No results for input(s): CKTOTAL, CKMB, CKMBINDEX, TROPONINI in the last 168 hours. BNP: Invalid input(s): POCBNP CBG: No results for input(s): GLUCAP in the last 168 hours. D-Dimer No results for input(s): DDIMER in the last 72 hours. Hgb A1c No results for input(s): HGBA1C in the last 72 hours. Lipid Profile No results for input(s): CHOL, HDL, LDLCALC, TRIG, CHOLHDL, LDLDIRECT in the last 72 hours. Thyroid function studies No results for input(s): TSH, T4TOTAL, T3FREE, THYROIDAB in the last 72 hours.  Invalid input(s): FREET3 Anemia work up No results for input(s): VITAMINB12, FOLATE, FERRITIN, TIBC, IRON, RETICCTPCT in the last 72 hours. Urinalysis    Component Value Date/Time   COLORURINE YELLOW 08/22/2016 1700   APPEARANCEUR CLEAR 08/22/2016 1700   LABSPEC 1.024 08/22/2016 1700   PHURINE 5.5 08/22/2016 1700   GLUCOSEU NEGATIVE 08/22/2016 1700   HGBUR NEGATIVE 08/22/2016 1700   BILIRUBINUR NEGATIVE 08/22/2016 1700   KETONESUR NEGATIVE 08/22/2016 1700   PROTEINUR NEGATIVE 08/22/2016 1700   UROBILINOGEN 1.0 10/04/2014 1240   NITRITE NEGATIVE 08/22/2016 1700   LEUKOCYTESUR NEGATIVE 08/22/2016 1700   Sepsis Labs Invalid input(s): PROCALCITONIN,  WBC,  LACTICIDVEN Microbiology No results found for this or any previous visit (from the past 240 hour(s)).   Time coordinating discharge: 45 minutes  SIGNED:   Tawni Millers,  MD  [facility] 11/28/2017, 10:06 AM Pager [phone]  If 7PM-7AM, please contact night-coverage [email or web address] Password [email or web address]

## 2017-11-30 ENCOUNTER — Ambulatory Visit (INDEPENDENT_AMBULATORY_CARE_PROVIDER_SITE_OTHER): Payer: Medicare Other | Admitting: *Deleted

## 2017-11-30 DIAGNOSIS — Z7901 Long term (current) use of anticoagulants: Secondary | ICD-10-CM

## 2017-11-30 DIAGNOSIS — Q211 Atrial septal defect: Secondary | ICD-10-CM | POA: Diagnosis not present

## 2017-11-30 DIAGNOSIS — Z952 Presence of prosthetic heart valve: Secondary | ICD-10-CM | POA: Diagnosis not present

## 2017-11-30 DIAGNOSIS — Z5181 Encounter for therapeutic drug level monitoring: Secondary | ICD-10-CM | POA: Diagnosis not present

## 2017-11-30 DIAGNOSIS — Q2112 Patent foramen ovale: Secondary | ICD-10-CM

## 2017-11-30 LAB — POCT INR: INR: 1.7 — AB (ref 2.0–3.0)

## 2017-11-30 NOTE — Patient Instructions (Signed)
Description   Today June 11th take 1 and 1/2 tablets then tomorrow June 12th take 2 tablets then  continue taking 1 tablet daily except 1and 1/2 tablets on Mondays, Wednesdays, and Fridays. Continue Lovenox 120mg  injections daily until your follow up appt on Friday.June 14th   [facility]ase call coumadin clinic with any new medications or if scheduled for any procedures [phone]

## 2017-12-02 DIAGNOSIS — F419 Anxiety disorder, unspecified: Secondary | ICD-10-CM | POA: Diagnosis not present

## 2017-12-02 DIAGNOSIS — K922 Gastrointestinal hemorrhage, unspecified: Secondary | ICD-10-CM | POA: Diagnosis not present

## 2017-12-03 ENCOUNTER — Ambulatory Visit (INDEPENDENT_AMBULATORY_CARE_PROVIDER_SITE_OTHER): Payer: Medicare Other | Admitting: *Deleted

## 2017-12-03 DIAGNOSIS — Z952 Presence of prosthetic heart valve: Secondary | ICD-10-CM

## 2017-12-03 DIAGNOSIS — Q2112 Patent foramen ovale: Secondary | ICD-10-CM

## 2017-12-03 DIAGNOSIS — Z7901 Long term (current) use of anticoagulants: Secondary | ICD-10-CM

## 2017-12-03 DIAGNOSIS — Q211 Atrial septal defect: Secondary | ICD-10-CM

## 2017-12-03 LAB — POCT INR: INR: 2 (ref 2.0–3.0)

## 2017-12-03 NOTE — Patient Instructions (Addendum)
Description   Today take 2 tablets, tomorrow take 1.5 tablets, then continue taking 1 tablet daily except 1and 1/2 tablets on Mondays, Wednesdays, and Fridays. Continue Lovenox 120mg  injections daily.  [facility]ase call coumadin clinic with any new medications or if scheduled for any procedures, and/or bleeding concerns to  [phone]

## 2017-12-07 ENCOUNTER — Encounter: Payer: 59 | Admitting: Thoracic Surgery (Cardiothoracic Vascular Surgery)

## 2017-12-08 ENCOUNTER — Ambulatory Visit (INDEPENDENT_AMBULATORY_CARE_PROVIDER_SITE_OTHER): Payer: Medicare Other | Admitting: Pharmacist

## 2017-12-08 DIAGNOSIS — Z7901 Long term (current) use of anticoagulants: Secondary | ICD-10-CM | POA: Diagnosis not present

## 2017-12-08 DIAGNOSIS — Q2112 Patent foramen ovale: Secondary | ICD-10-CM

## 2017-12-08 DIAGNOSIS — Q211 Atrial septal defect: Secondary | ICD-10-CM

## 2017-12-08 DIAGNOSIS — Z952 Presence of prosthetic heart valve: Secondary | ICD-10-CM | POA: Diagnosis not present

## 2017-12-08 LAB — POCT INR: INR: 3.1 — AB (ref 2.0–3.0)

## 2017-12-08 NOTE — Patient Instructions (Signed)
Description   Continue taking 1 tablet daily except 1.5 tablets on Mondays, Wednesdays, and Fridays. You can stop your Lovenox injections.  Recheck INR in 3 weeks.

## 2017-12-09 ENCOUNTER — Ambulatory Visit (INDEPENDENT_AMBULATORY_CARE_PROVIDER_SITE_OTHER): Payer: Medicare Other | Admitting: Psychology

## 2017-12-09 DIAGNOSIS — I63411 Cerebral infarction due to embolism of right middle cerebral artery: Secondary | ICD-10-CM | POA: Diagnosis not present

## 2017-12-09 DIAGNOSIS — R413 Other amnesia: Secondary | ICD-10-CM

## 2017-12-09 NOTE — Progress Notes (Signed)
NEUROBEHAVIORAL STATUS EXAM   Name: Tracy Marshall. Date of Birth: [birth year] Date of Interview: 12/09/2017  Reason for Referral:  Tracy Marshall. is a 67 y.o. male who is referred for neuropsychological evaluation by Dr. Narda Amber of [facility] due to concerns about cognitive changes and history of stroke. This patient is accompanied in the office by his wife who supplements the history.  History of Presenting Problem:  Tracy Marshall has been followed by Dr. Posey Pronto since May 2016. He has a history of mechanical aortic valve replacement (2001), TIA (2001), and strokes (05/2014, 07/2016). His initial stroke in December 2015 manifested with left side facial droop; he did not seek medical attention for about a month afterwards. MRI brain in January 2016 reportedly showed subacute right insula infarct with 2 foci of punctate left frontal cortex infarcts, and also evidence of old infarcts involving the right temporoparietal region. These findings were concerning for central embolic source. He did experience some memory changes, and MoCA on 01/21/2015 was 22/30. However memory issues slowly improved over the following year and therefore formal neurocognitive evaluation was not performed. In February 2018, he was hospitalized for acute onset of left sided weakness, numbness, and vision changes.  CT showed hyperdense basilar and proximal right MCA.  CTA head showed large vessel occlusion at the right ICA terminus extending to the right M1 and A1 segments. He underwent thrombectomy with marked improvement. Given large vessel occlusion, thrombus from mechanical valve was again suspected in the setting of subtherapeutic INR. In April 2018, he was found to have asymptomatic RLE DVT on therapeutic anticoagulation. In May 2019, he underwent PFO closure and removal of gastric polyps. On November 23, 2017, he was admitted for upper GI bleed.   The patient does not have concerns about his cognitive functioning.  He admits to occasional word finding difficulty but nothing that interferes with daily life.   Since his last stroke in February 2018, his wife has noticed cognitive changes which have improved to some extent but are still concerning to her. She is not sure if cognitive changes are due to his history of stroke(s), chronic and severe back pain (for about 3 years), or use of pain medications (oxycodone about 3 years). Fortunately the patient's back pain has recently improved somewhat, and along with that (as well as reduced use of oxycodone and improved sleep), his wife has noticed improved cognitive functioning.   The patient's wife reports slower processing, memory lapses (especially when he is tired or stressed), frequent misplacement of items such as his glasses, difficulty comprehending in conversation (at times), and a "harder time figuring things out". She also reports that he gets frustrated very quickly. He used to be very "mellow" and easy-going and now he is frequently irritable. He agrees with this, stating, "Things irritate me quicker." However, recently, with some improvement in pain level and sleep, his mood has improved.   He did experience significant depression for the 2-3 years that his back pain was severe. He endorsed passive suicidal ideation during that time, due to the severity of pain and limitations on daily functioning. He also had a period of reduced appetite and lost about 40 lbs but that has improved. He was having great difficulty sleeping due to pain, but he is now able to sleep about 6-7 hours straight per night. He does have some daytime fatigue and takes naps.   He has no history of depression or other mental health concern prior to  onset of chronic pain 3 years ago. He has never been seen by a psychiatrist or mental health provider.   He has no history of head injury or concussion.   He has no family history of dementia.   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 His office was closed a year later and he lost his job. He has been retired since then (2011). 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 Marital history: Married with no children Alcohol: Minimal, socially Tobacco: None, never a tobacco user   Medical History: Past Medical History:  Diagnosis Date  . Anemia    iron  . Arthritis   . Atypical moles    melanomna  . Basal cell carcinoma   . Bilateral carotid artery stenosis 08/03/2014   1-39% right and 40-59% left carotid artery stenosis  . Chronic anticoagulation    for mechanical AVR  . Cluster headaches   . CVA (cerebral vascular accident) ([place]) 07/24/2014    MRI indicating 2 punctate foci of acute infarction.  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   . Diverticulosis   . Dizziness   . Episodic recurrent vertigo 2002 & 2005   carotid dopplers w no clinically significant stenosis  . Fatty liver    hx of elevated hepatic transaminases-negative workup in 2009 except for U/S suggesting fatty liver  . GERD (gastroesophageal reflux disease)   . Gout   . Heart murmur   . Hyperlipidemia   . Hyperlipidemia LDL goal <70 12/05/2015  . Joint pain   . LBBB (left bundle branch block)   . Melanoma ([place])    hx of melanoma and multiple basal cell carcinomas Dr Tonia Brooms  . PFO (patent foramen ovale) 12/05/2015  . Positive ANA (antinuclear antibody)   . S/P AVR (aortic valve replacement)    mechanical  . TIA (transient ischemic attack)       Current Medications:  Outpatient Encounter Medications as of 12/09/2017  Medication Sig  . acetaminophen (TYLENOL) 500 MG tablet Take 1,000 mg by mouth every 6 (six) hours as needed for moderate pain.  . Ascorbic Acid  (VITAMIN C PO) Take 1 tablet by mouth daily.   [name] aspirin 81 MG tablet Take 81 mg by mouth daily.  . colchicine 0.6 MG tablet Take 0.6 mg by mouth daily as needed (gout flares).   . diazepam (VALIUM) 2 MG tablet Take 2 mg by mouth at bedtime as needed (for sleep).   . enoxaparin (LOVENOX) 120 MG/0.8ML injection Inject 0.8 mLs (120 mg total) into the skin daily for 8 days.  . fenofibrate (TRICOR) 145 MG tablet Take 1 tablet (145 mg total) by mouth daily.  . ferrous sulfate 325 (65 FE) MG tablet Take 325 mg by mouth 2 (two) times daily with a meal.  . fluticasone (FLONASE) 50 MCG/ACT nasal spray Place 2 sprays into both nostrils daily.  [name] HYDROcodone-acetaminophen (NORCO) 10-325 MG tablet Take 1 tablet by mouth daily as needed (for back pain).   . hydrocortisone cream 1 % Apply 1 application topically 2 (two) times daily as needed for itching.   . Multiple Vitamins-Minerals (ONE-A-DAY MENS 50+ ADVANTAGE) TABS Take 1 tablet by mouth daily.  . OXcarbazepine (TRILEPTAL) 150 MG tablet Take 300 mg by mouth 2 (two)  times daily.   . pantoprazole (PROTONIX) 40 MG tablet Take 40 mg by mouth daily.  . pravastatin (PRAVACHOL) 40 MG tablet Take 1 tablet (40 mg total) by mouth daily.  . pregabalin (LYRICA) 150 MG capsule Take 300 mg by mouth 2 (two) times daily.  . ranitidine (ZANTAC) 75 MG tablet Take 75 mg by mouth daily as needed for heartburn.   . warfarin (COUMADIN) 3 MG tablet TAKE AS DIRECTED BY COUMADIN CLINIC (Patient taking differently: Take 3 mg by mouth daily on Sunday, Tuesday, Thursday and Saturday. Take 1.5 mg by mouth daily on Monday, Wednesday and Friday)   No facility-administered encounter medications on file as of 12/09/2017.      Behavioral Observations:   Appearance: Neatly and appropriately dressed and groomed Gait: Ambulated independently, no gross abnormalities observed Speech: Fluent; normal rate, rhythm and volume. No significant word finding difficulty. Thought process: Linear,  goal directed Affect: Mildly blunted Interpersonal: Very pleasant, appropriate   50 minutes spent face-to-face with patient completing neurobehavioral status exam. 45 minutes spent integrating medical records/clinical data and completing this report. CPT codes [id number] unit; [id number] unit.   TESTING: There is medical necessity to proceed with neuropsychological assessment as the results will be used to aid in differential diagnosis and clinical decision-making and to inform specific treatment recommendations. Per the patient, his wife and medical records reviewed, there has been a change in cognitive functioning and a reasonable suspicion of neurocognitive disorder (multiple possible contributing factors including history of stroke and multiple small infarcts, chronic pain, chronic sleep impairment, effects of pain medication).   Clinical Decision Making: In considering the patient's current level of functioning, level of presumed impairment, nature of symptoms, emotional and behavioral responses during the interview, level of literacy, and observed level of motivation, a battery of tests was selected and communicated to the psychometrician.    PLAN: The patient will return tomorrow to complete the above referenced full battery of neuropsychological testing with a psychometrician under my supervision. Education regarding testing procedures was provided to the patient. Subsequently, the patient will see this provider for a follow-up session at which time his test performances and my impressions and treatment recommendations will be reviewed in detail.  Evaluation ongoing; full report to follow.

## 2017-12-10 ENCOUNTER — Encounter: Payer: Self-pay | Admitting: Psychology

## 2017-12-10 ENCOUNTER — Ambulatory Visit: Payer: Medicare Other | Admitting: Psychology

## 2017-12-10 DIAGNOSIS — R413 Other amnesia: Secondary | ICD-10-CM

## 2017-12-10 NOTE — Progress Notes (Signed)
   Neuropsychology Note  Tracy Marshall. completed 60 minutes of neuropsychological testing with technician, Milana Kidney, BS, under the supervision of Dr. Macarthur Critchley, Licensed Psychologist. The patient did not appear overtly distressed by the testing session, per behavioral observation or via self-report to the technician. Rest breaks were offered.   Clinical Decision Making: In considering the patient's current level of functioning, level of presumed impairment, nature of symptoms, emotional and behavioral responses during the interview, level of literacy, and observed level of motivation/effort, a battery of tests was selected and communicated to the psychometrician.  Communication between the psychologist and technician was ongoing throughout the testing session and changes were made as deemed necessary based on patient performance on testing, technician observations and additional pertinent factors such as those listed above.  Tracy Marshall. will return within approximately 2 weeks for an interactive feedback session with Dr. Si Raider at which time his test performances, clinical impressions and treatment recommendations will be reviewed in detail. The patient understands he can contact our office should he require our assistance before this time.  35 minutes spent performing neuropsychological evaluation services/clinical decision making (psychologist). [CPT 20802] 60 minutes spent face-to-face with patient administering standardized tests, 30 minutes spent scoring (technician). [CPT Y8200648, 23361]  Full report to follow.

## 2017-12-12 ENCOUNTER — Encounter: Payer: Self-pay | Admitting: Psychology

## 2017-12-13 NOTE — Progress Notes (Signed)
[facility] [place]                                       Cardiology Office Note    Date:  12/15/2017   ID:  Tracy Malta., DOB 1951-06-01, MRN [id number]  PCP:  Orpah Melter, MD  Cardiologist:  Dr. Radford Pax / Dr. Burt Knack (PFO closure)  CC: 1 month s/p PFO closure   History of Present Illness:  Tracy Ambrosio. is a 67 y.o. male with a history of thoracic aortic aneurysm, mechanical aortic valve on coumadin, chronic DVT, and PFO with recurrent strokes s/p PFO closure (11/16/17) who presents to clinic for follow up.  He has a hx of bicuspid aortic stenosis and underwent mechanical aortic valve replacement in 2001.Since that time he has had recurrent TIA/stroke episodes. Initially he had slurred speech a few weeks after his valve replacement and he was felt to have had a TIA. He had some recurrent neurologic symptoms in 2015/2016 in the setting of an illness and MRI imaging of the brain was done.This demonstrated multiple small infarcts, with subacute infarcts in the right insula and punctate infarcts in the left frontal cortex as well as old infarcts in the right temporoparietal region.Findings were felt to be consistent with a central embolic source. A TEE at that time did not show any plaque in the aortic arch or any thrombus on the patient's mechanical valve. He was noted to have a PFO. Recommendations were made to continue oral anticoagulation as well as antiplatelet therapy with aspirin. The patient then presented in February 2018 with acute onset left-sided weakness, numbness, and vision changes he was found to have a proximal right MCA infarct with large vessel occlusion at the right ICA terminus extending to the right M1 and A1 segments.His INR was 2.3. He was managed with mechanical embolectomy and improved immediately. A surface echocardiogram demonstrated a PFO but no other significant abnormalities. He was bridged with Lovenox  until his INR was back in the range of 2.5-3.5. When he was seen back in April 2018, a venous duplex study was performed because of his recurrent stroke and known PFO. Despite therapeutic anticoagulation, he was diagnosed with chronic DVT in the right mid femoral vein and age-indeterminate DVT in the right above-knee popliteal vein.   He was seen by Dr. Burt Knack in consultation for consideration of transcatheter PFO closure. He  reviewed his TEE from 2016 which demonstrated a moderate to large PFO with strongly positive bubble study demonstrating R--->L shunt. Dr. Burt Knack felt like he would be a candidate for PFO closure given his known PFO with recurrent TIA/stroke in patient with mechanical aortic prosthesis on long-term anticoagulation and documented DVT. This was set up in 07/2017 but then cancelled because pre admission labs revealed a Hg of 7. He was sent to [facility] with GI and underwent an extensive GI work up. No etiology of this anemia was found, but he did have polyps that required removal. His hemoglobin improved to ~12 and he was set up for endoscopy and polypectomy on 5/28 followed by PFO closure the following day 5/29. Coumadin was bridged with Lovenox.   He underwent successful transcatheter PFO closure using a 25 mm Amplatzer PFO Occluder on 11/17/17. Follow up limited echo showed no obvious PFO by color doppler. He was started on ASA 81 mg daily to continued indefinitely  if tolerated. Coumadin was resumed.   He was then readmitted 6/4-11/28/17 for near syncope and melena. He was transfused and underwent upper endoscopy and underwent EPI/saline submucosally injected with good blanching. Discharge Hg 10.6.  Today he presents to clinic for follow up. He has been doing well. No CP or SOB. No LE edema, orthopnea or PND. No dizziness or syncope. No blood in stool or urine. No palpitations. No new neurologic symptoms.    Past Medical History:  Diagnosis Date  . Anemia    iron  . Arthritis   .  Atypical moles    melanomna  . Basal cell carcinoma   . Bilateral carotid artery stenosis 08/03/2014   1-39% right and 40-59% left carotid artery stenosis  . Chronic anticoagulation    for mechanical AVR  . Cluster headaches   . CVA (cerebral vascular accident) ([place]) 07/24/2014    MRI indicating 2 punctate foci of acute infarction.  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   . Diverticulosis   . Dizziness   . Episodic recurrent vertigo 2002 & 2005   carotid dopplers w no clinically significant stenosis  . Fatty liver    hx of elevated hepatic transaminases-negative workup in 2009 except for U/S suggesting fatty liver  . GERD (gastroesophageal reflux disease)   . Gout   . Heart murmur   . Hyperlipidemia   . Hyperlipidemia LDL goal <70 12/05/2015  . Joint pain   . LBBB (left bundle branch block)   . Melanoma ([place])    hx of melanoma and multiple basal cell carcinomas Dr Tonia Brooms  . PFO (patent foramen ovale) 12/05/2015  . Positive ANA (antinuclear antibody)   . S/P AVR (aortic valve replacement)    mechanical  . TIA (transient ischemic attack)     Past Surgical History:  Procedure Laterality Date  . APPENDECTOMY    . CARDIAC VALVE REPLACEMENT     mechanical  . CHOLECYSTECTOMY    . DENTAL SURGERY Left bone graft and extraction  . ESOPHAGOGASTRODUODENOSCOPY (EGD) WITH PROPOFOL N/A 11/16/2017   Procedure: ESOPHAGOGASTRODUODENOSCOPY (EGD) WITH PROPOFOL;  Surgeon: Wonda Horner, MD;  Location: [facility];  Service: Endoscopy;  Laterality: N/A;  . ESOPHAGOGASTRODUODENOSCOPY (EGD) WITH PROPOFOL N/A 11/25/2017   Procedure: ESOPHAGOGASTRODUODENOSCOPY (EGD) WITH PROPOFOL;  Surgeon: Wilford Corner, MD;  Location: [place];  Service: Endoscopy;  Laterality: N/A;  . MELANOMA EXCISION     x3  . PATENT FORAMEN OVALE(PFO) CLOSURE N/A 11/17/2017   Procedure: PATENT FORAMEN OVALE (PFO) CLOSURE;  Surgeon: Sherren Mocha, MD;   Location: [facility];  Service: Cardiovascular;  Laterality: N/A;  . POLYPECTOMY  11/16/2017   Procedure: POLYPECTOMY;  Surgeon: Wonda Horner, MD;  Location: [facility];  Service: Endoscopy;;  . SUBMUCOSAL INJECTION  11/25/2017   Procedure: SUBMUCOSAL INJECTION of epinephrine;  Surgeon: Wilford Corner, MD;  Location: [place];  Service: Endoscopy;;  . TEE WITHOUT CARDIOVERSION N/A 07/31/2014   Procedure: TRANSESOPHAGEAL ECHOCARDIOGRAM (TEE);  Surgeon: Sueanne Margarita, MD;  Location: [facility] ENDOSCOPY;  Service: Cardiovascular;  Laterality: N/A;  . TEE WITHOUT CARDIOVERSION N/A 10/01/2014   Procedure: TRANSESOPHAGEAL ECHOCARDIOGRAM (TEE);  Surgeon: Lelon Perla, MD;  Location: [facility] ENDOSCOPY;  Service: Cardiovascular;  Laterality: N/A;    Current Medications: Outpatient Medications Prior to Visit  Medication Sig Dispense Refill  . acetaminophen (TYLENOL) 500 MG tablet Take 1,000 mg by mouth every 6 (six) hours as needed for moderate pain.    . Ascorbic  Acid (VITAMIN C PO) Take 1 tablet by mouth daily.     [name] aspirin 81 MG tablet Take 81 mg by mouth daily.    . colchicine 0.6 MG tablet Take 0.6 mg by mouth daily as needed (gout flares).     . diazepam (VALIUM) 2 MG tablet Take 2 mg by mouth at bedtime as needed (for sleep).     . fenofibrate (TRICOR) 145 MG tablet Take 1 tablet (145 mg total) by mouth daily. 90 tablet 3  . ferrous sulfate 325 (65 FE) MG tablet Take 325 mg by mouth 2 (two) times daily with a meal.    . fluticasone (FLONASE) 50 MCG/ACT nasal spray Place 2 sprays into both nostrils daily.    [name] gabapentin (NEURONTIN) 300 MG capsule Take 300 mg by mouth 3 (three) times daily.    [name] HYDROcodone-acetaminophen (NORCO) 10-325 MG tablet Take 1 tablet by mouth daily as needed (for back pain).     . hydrocortisone cream 1 % Apply 1 application topically 2 (two) times daily as needed for itching.     . Multiple Vitamins-Minerals (ONE-A-DAY MENS 50+ ADVANTAGE) TABS Take 1 tablet by  mouth daily.    . OXcarbazepine (TRILEPTAL) 150 MG tablet Take 300 mg by mouth 2 (two) times daily.     . pantoprazole (PROTONIX) 40 MG tablet Take 40 mg by mouth daily.    . pravastatin (PRAVACHOL) 40 MG tablet Take 1 tablet (40 mg total) by mouth daily. 90 tablet 3  . ranitidine (ZANTAC) 75 MG tablet Take 75 mg by mouth daily as needed for heartburn.     . warfarin (COUMADIN) 3 MG tablet TAKE AS DIRECTED BY COUMADIN CLINIC (Patient taking differently: Take 3 mg by mouth daily on Sunday, Tuesday, Thursday and Saturday. Take 1.5 mg by mouth daily on Monday, Wednesday and Friday) 30 tablet 3  . pregabalin (LYRICA) 150 MG capsule Take 300 mg by mouth 2 (two) times daily.     No facility-administered medications prior to visit.      Allergies:   Patient has no known allergies.   Social History   Socioeconomic History  . Marital status: Married    Spouse name: Not on file  . Number of children: Not on file  . Years of education: Not on file  . Highest education level: Not on file  Occupational History  . Not on file  Social Needs  . Financial resource strain: Not on file  . Food insecurity:    Worry: Not on file    Inability: Not on file  . Transportation needs:    Medical: Not on file    Non-medical: Not on file  Tobacco Use  . Smoking status: Never Smoker  . Smokeless tobacco: Never Used  Substance and Sexual Activity  . Alcohol use: Yes    Alcohol/week: 0.0 oz    Comment: moderate - 2-3 drinks about 3 times per week of wine, liquor, beer  . Drug use: No  . Sexual activity: Not Currently  Lifestyle  . Physical activity:    Days per week: Not on file    Minutes per session: Not on file  . Stress: Not on file  Relationships  . Social connections:    Talks on phone: Not on file    Gets together: Not on file    Attends religious service: Not on file    Active member of club or organization: Not on file    Attends meetings of clubs or organizations:  Not on file     Relationship status: Not on file  Other Topics Concern  . Not on file  Social History Narrative   Lives with wife in a one story home.  No children.     Retired from [employer].     Family History:  The patient's family history includes Cancer in his father and mother; Melanoma in his unknown relative; Psoriasis in his unknown relative; Scoliosis in his sister; Valvular heart disease in his brother.      ROS:   Please see the history of present illness.    ROS All other systems reviewed and are negative.   PHYSICAL EXAM:   VS:  BP 100/66   Pulse (!) 57   Ht 5\' 10"  (1.778 m)   Wt 189 lb (85.7 kg)   SpO2 96%   BMI 27.12 kg/m    GEN: Well nourished, well developed, in no acute distress  HEENT: normal  Neck: no JVD, carotid bruits, or masses Cardiac: RRR. 2/6 SEM with mechanical click. No rubs, or gallops,no edema  Respiratory:  clear to auscultation bilaterally, normal work of breathing GI: soft, nontender, nondistended, + BS MS: no deformity or atrophy  Skin: warm and dry, no rash Neuro:  Alert and Oriented x 3, Strength and sensation are intact Psych: euthymic mood, full affect    Wt Readings from Last 3 Encounters:  12/15/17 189 lb (85.7 kg)  12/14/17 180 lb (81.6 kg)  11/26/17 179 lb 3.2 oz (81.3 kg)      Studies/Labs Reviewed:   EKG:  EKG is NOT ordered today.   Recent Labs: 11/23/2017: ALT 16 11/26/2017: BUN 8; Creatinine, Ser 1.04; Potassium 3.4; Sodium 142 11/28/2017: Hemoglobin 10.6; Platelets 253   Lipid Panel    Component Value Date/Time   CHOL 149 02/26/2016 1058   TRIG 115 02/26/2016 1058   HDL 67 02/26/2016 1058   CHOLHDL 2.2 02/26/2016 1058   VLDL 23 02/26/2016 1058   LDLCALC 59 02/26/2016 1058    Additional studies/ records that were reviewed today include:  11/17/17 Conclusion  Successful transcatheter PFO closure using a 25 mm Amplatzer PFO Occluder  Recommend:  ASA 81 mg daily indefinitely if tolerated  Warfarin - resume tonight  per Coumadin Clinic instructions  Lovenox bridging - resume tonight  SBE prophylaxis indefinitely in setting mechanical aortic valve    Limited echo 11/17/17 Study Conclusions - HPI and indications: Limited study for PFO (patent foramen ovale)   closure. - Procedure narrative: Transthoracic echocardiography. Image   quality was poor. The study was technically difficult, as a   result of restricted patient mobility. - Atrial septum: Off axis images. There is an amplatzer occluder   device noted across the atrial septum. No obvious shunting by   color doppler. Bubble study was not performed. Impressions: - Technically difficult limited study for PFO closure. Amplatzer   device shown in position - no obvious PFO by color doppler,   suggestive of successful PFO closure.   ASSESSMENT & PLAN:   PFO with recurrent stokes: now s/p PFO closure. Doing well. Groin site is healed well. No new neurologic symptoms. I will see him back in 1 year with a limited echo with bubble study. SBE prophylaxis discussed. He has to take prophylactic Abx for his mechanical heart valve indefinitely .   Mechanical AVR on coumadin: this is stable.   GI bleeding: no more melena. He is followed closely by GI  Anemia: Hg back up to 10.6 at recent  discharge.    Medication Adjustments/Labs and Tests Ordered: Current medicines are reviewed at length with the patient today.  Concerns regarding medicines are outlined above.  Medication changes, Labs and Tests ordered today are listed in the Patient Instructions below. Patient Instructions  Medication Instructions:  Your physician recommends that you continue on your current medications as directed. Please refer to the Current Medication list given to you today.  Labwork: NONE today  Testing/Procedures: Your physician has requested that you have an echocardiogram in one year. Echocardiography is a painless test that uses sound waves to create images of your  heart. It provides your doctor with information about the size and shape of your heart and how well your heart's chambers and valves are working. This procedure takes approximately one hour. There are no restrictions for this procedure.  Follow-Up: Your physician recommends that you schedule a follow-up appointment in: November with Dr. Radford Pax.   Your physician wants you to follow-up in: 1 year with Nell Range PA.  You will receive a reminder letter in the mail two months in advance. If you don't receive a letter, please call our office to schedule the follow-up appointment.   If you need a refill on your cardiac medications before your next appointment, please call your pharmacy.      Signed, Angelena Form, PA-C  12/15/2017 3:21 PM    [facility] [place], [place], [place]  [address] Phone: [phone]; Fax: [phone]

## 2017-12-14 ENCOUNTER — Other Ambulatory Visit: Payer: Self-pay

## 2017-12-14 ENCOUNTER — Ambulatory Visit
Admit: 2017-12-14 | Discharge: 2017-12-14 | Disposition: A | Discharge status: Home / Self Care | Payer: Medicare Other | Attending: Thoracic Surgery (Cardiothoracic Vascular Surgery) | Admitting: Thoracic Surgery (Cardiothoracic Vascular Surgery)

## 2017-12-14 ENCOUNTER — Ambulatory Visit (INDEPENDENT_AMBULATORY_CARE_PROVIDER_SITE_OTHER): Payer: Medicare Other | Admitting: Thoracic Surgery (Cardiothoracic Vascular Surgery)

## 2017-12-14 ENCOUNTER — Encounter: Payer: Self-pay | Admitting: Thoracic Surgery (Cardiothoracic Vascular Surgery)

## 2017-12-14 VITALS — BP 102/73 | HR 53 | Resp 16 | Ht 70.0 in | Wt 180.0 lb

## 2017-12-14 DIAGNOSIS — I712 Thoracic aortic aneurysm, without rupture, unspecified: Secondary | ICD-10-CM

## 2017-12-14 DIAGNOSIS — Z952 Presence of prosthetic heart valve: Secondary | ICD-10-CM | POA: Diagnosis not present

## 2017-12-14 DIAGNOSIS — I63411 Cerebral infarction due to embolism of right middle cerebral artery: Secondary | ICD-10-CM

## 2017-12-14 MED ORDER — IOPAMIDOL (ISOVUE-370) INJECTION 76%
75.0000 mL | Freq: Once | INTRAVENOUS | Status: AC | PRN
  Administered 2017-12-14: 75 mL via INTRAVENOUS

## 2017-12-14 NOTE — Progress Notes (Signed)
[address]       [place] [address]             [phone]     HPI: [name] returns for follow-up of his ascending aneurysm  Tracy Marshall is a 67 year old gentleman who had a mechanical AVR 2001 in [place].  In early 2016 he had a right CVA.  He was found to have a PFO.  During his work-up he had a CT angiogram which showed an ascending aneurysm measured at 4.5 cm.  We have been following him since then.  I last saw him in the office in June 2018.  He had another stroke but had full recovery.  His aneurysm was stable measuring 4.2 cm at that time.  In the interim since his last visit he had a PFO closure by Dr. Burt Knack.  In May he had a gastric polypectomy.  Earlier this month he was admitted with a gastrointestinal bleed from the polypectomy sites.  He was discharged on 11/28/2017.  Since then he has not had any further issues with bleeding.  He feels well.  He is not having any chest pain, pressure, or tightness.  Past Medical History:  Diagnosis Date  . Anemia    iron  . Arthritis   . Atypical moles    melanomna  . Basal cell carcinoma   . Bilateral carotid artery stenosis 08/03/2014   1-39% right and 40-59% left carotid artery stenosis  . Chronic anticoagulation    for mechanical AVR  . Cluster headaches   . CVA (cerebral vascular accident) ([place]) 07/24/2014    MRI indicating 2 punctate foci of acute infarction.  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   . Diverticulosis   . Dizziness   . Episodic recurrent vertigo 2002 & 2005   carotid dopplers w no clinically significant stenosis  . Fatty liver    hx of elevated hepatic transaminases-negative workup in 2009 except for U/S suggesting fatty liver  . GERD (gastroesophageal reflux disease)   . Gout   . Heart murmur   . Hyperlipidemia   . Hyperlipidemia LDL goal <70 12/05/2015  . Joint pain   . LBBB (left bundle branch block)   .  Melanoma ([place])    hx of melanoma and multiple basal cell carcinomas Dr Tonia Brooms  . PFO (patent foramen ovale) 12/05/2015  . Positive ANA (antinuclear antibody)   . S/P AVR (aortic valve replacement)    mechanical  . TIA (transient ischemic attack)       Current Outpatient Medications  Medication Sig Dispense Refill  . acetaminophen (TYLENOL) 500 MG tablet Take 1,000 mg by mouth every 6 (six) hours as needed for moderate pain.    . Ascorbic Acid (VITAMIN C PO) Take 1 tablet by mouth daily.     [name] aspirin 81 MG tablet Take 81 mg by mouth daily.    . colchicine 0.6 MG tablet Take 0.6 mg by mouth daily as needed (gout flares).     . diazepam (VALIUM) 2 MG tablet Take 2 mg by mouth at bedtime as needed (for sleep).     . fenofibrate (TRICOR) 145 MG tablet Take 1 tablet (145 mg total) by mouth daily. 90 tablet 3  . ferrous sulfate 325 (65 FE) MG tablet Take 325 mg by mouth 2 (two) times daily with a meal.    . fluticasone (FLONASE) 50 MCG/ACT nasal spray Place 2 sprays into  both nostrils daily.    [name] HYDROcodone-acetaminophen (NORCO) 10-325 MG tablet Take 1 tablet by mouth daily as needed (for back pain).     . hydrocortisone cream 1 % Apply 1 application topically 2 (two) times daily as needed for itching.     . Multiple Vitamins-Minerals (ONE-A-DAY MENS 50+ ADVANTAGE) TABS Take 1 tablet by mouth daily.    . OXcarbazepine (TRILEPTAL) 150 MG tablet Take 300 mg by mouth 2 (two) times daily.     . pantoprazole (PROTONIX) 40 MG tablet Take 40 mg by mouth daily.    . pravastatin (PRAVACHOL) 40 MG tablet Take 1 tablet (40 mg total) by mouth daily. 90 tablet 3  . pregabalin (LYRICA) 150 MG capsule Take 300 mg by mouth 2 (two) times daily.    . ranitidine (ZANTAC) 75 MG tablet Take 75 mg by mouth daily as needed for heartburn.     . warfarin (COUMADIN) 3 MG tablet TAKE AS DIRECTED BY COUMADIN CLINIC (Patient taking differently: Take 3 mg by mouth daily on Sunday, Tuesday, Thursday and Saturday. Take 1.5  mg by mouth daily on Monday, Wednesday and Friday) 30 tablet 3   No current facility-administered medications for this visit.     Physical Exam BP 102/73 (BP Location: Right Arm, Patient Position: Sitting, Cuff Size: Large)   Pulse (!) 53   Resp 16   Ht 5\' 10"  (1.778 m)   Wt 180 lb (81.6 kg)   SpO2 98% Comment: ON RA  BMI 25.24 kg/m  68 year old man in no acute distress Alert and oriented x3 with no focal deficits Lungs clear with equal breath sounds bilaterally Cardiac bradycardic, regular, good valve click No peripheral edema  Diagnostic Tests: CT ANGIOGRAPHY CHEST WITH CONTRAST  TECHNIQUE: Multidetector CT imaging of the chest was performed using the standard protocol during bolus administration of intravenous contrast. Multiplanar CT image reconstructions and MIPs were obtained to evaluate the vascular anatomy.  Creatinine was obtained on site at [facility] at [address]  Results: Creatinine 1.0 mg/dL.  CONTRAST:  15mL ISOVUE-370 IOPAMIDOL (ISOVUE-370) INJECTION 76%  COMPARISON:  Prior CT scan of the chest 12/09/2016  FINDINGS: Cardiovascular: Excellent opacification of the arterial structures. Four vessel aortic arch. The left vertebral artery arises directly from the aorta. Aneurysmal dilatation of the tubular portion of the ascending thoracic aorta is again present. The maximal transverse diameter is 4.4 cm, slightly enlarged compared to 4.2 cm previously. Patient has undergone prior replacement of the aortic valve with a mechanical bioprosthesis. No evidence of thoracic aortic dissection. The transverse and descending thoracic aorta remain normal in caliber. Normal caliber main pulmonary artery. The heart is normal in size. No pericardial effusion.  Mediastinum/Nodes: Unremarkable CT appearance of the thyroid gland. No suspicious mediastinal or hilar adenopathy. No soft tissue mediastinal mass. The thoracic esophagus is  unremarkable.  Lungs/Pleura: No significant interval change in the size of the 6 mm ground-glass attenuation airspace opacity in the most inferior aspect of the right middle lobe compared to 12/09/2016. This region was harder to identify on the prior study from 2017. The lungs are otherwise clear. No pleural effusion or pneumothorax.  Upper Abdomen: No acute abnormality in the visualized upper abdomen. Multifocal cortical scarring present in the upper pole of the left kidney. Several circumscribed water attenuation cystic lesions are also present consistent with simple renal cysts.  Musculoskeletal: No acute fracture or aggressive appearing lytic or blastic osseous lesion.  Review of the MIP images confirms the above findings.  IMPRESSION: 1. 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 The maximal diameter on the present study is 4.4 cm compared to 4.2 cm in June of 2018 and 4.4 cm in 2017. Recommend annual imaging followup by CTA or MRA. This recommendation follows 2010 ACCF/AHA/AATS/ACR/ASA/SCA/SCAI/SIR/STS/SVM Guidelines for the Diagnosis and Management of Patients with Thoracic Aortic Disease. Circulation. 2010; 121: N183-F582. 2. Stable 6 mm ground-glass attenuation opacity in the inferior right middle lobe compared to 12/09/2016. The nodule may be slightly more full in appearance when compared to the more remote study from 2017. Recommend continued attention on follow-up imaging. 3. Additional ancillary findings as above.  Aortic aneurysm NOS (ICD10-I71.9).  Signed,  Criselda Peaches, MD  [facility]  [facility]   Electronically Signed   By: Tracy Marshall M.D.   On: 12/14/2017 10:19 Personally reviewed the CT angio and the findings noted above  Impression: Mr. Belt is a 67 year old gentleman with a known small ascending aortic aneurysm.  He has  a previous history of a mechanical aortic valve replacement back in 2001.  We have been following his a sending aneurysm since 2016 shortly after he moved to [place].  There is been no significant change over that time.  On today's CT the aneurysm was measured at 4.4 cm compared to 4.2 cm previously.  Looking at the films side-by-side do not think there is of difference between the 2.  There is no indication for surgery, but he needs continued annual follow-up.  Blood pressures well controlled on current regimen.  Recent GI bleed-no further signs or symptoms of GI bleed with resumption of anticoagulation.  Plan: Return in 1 year with CT angiogram of chest  Melrose Nakayama, MD [facility] [phone]

## 2017-12-15 ENCOUNTER — Encounter: Payer: Self-pay | Admitting: Physician Assistant

## 2017-12-15 ENCOUNTER — Ambulatory Visit (INDEPENDENT_AMBULATORY_CARE_PROVIDER_SITE_OTHER): Payer: Medicare Other | Admitting: Physician Assistant

## 2017-12-15 VITALS — BP 100/66 | HR 57 | Ht 70.0 in | Wt 189.0 lb

## 2017-12-15 DIAGNOSIS — D62 Acute posthemorrhagic anemia: Secondary | ICD-10-CM | POA: Diagnosis not present

## 2017-12-15 DIAGNOSIS — Q211 Atrial septal defect: Secondary | ICD-10-CM

## 2017-12-15 DIAGNOSIS — K922 Gastrointestinal hemorrhage, unspecified: Secondary | ICD-10-CM

## 2017-12-15 DIAGNOSIS — Q2112 Patent foramen ovale: Secondary | ICD-10-CM

## 2017-12-15 DIAGNOSIS — Z952 Presence of prosthetic heart valve: Secondary | ICD-10-CM | POA: Diagnosis not present

## 2017-12-15 DIAGNOSIS — I63411 Cerebral infarction due to embolism of right middle cerebral artery: Secondary | ICD-10-CM | POA: Diagnosis not present

## 2017-12-15 NOTE — Patient Instructions (Signed)
Medication Instructions:  Your physician recommends that you continue on your current medications as directed. Please refer to the Current Medication list given to you today.  Labwork: NONE today  Testing/Procedures: Your physician has requested that you have an echocardiogram in one year. Echocardiography is a painless test that uses sound waves to create images of your heart. It provides your doctor with information about the size and shape of your heart and how well your heart's chambers and valves are working. This procedure takes approximately one hour. There are no restrictions for this procedure.  Follow-Up: Your physician recommends that you schedule a follow-up appointment in: November with Dr. Radford Pax.   Your physician wants you to follow-up in: 1 year with Nell Range PA.  You will receive a reminder letter in the mail two months in advance. If you don't receive a letter, please call our office to schedule the follow-up appointment.   If you need a refill on your cardiac medications before your next appointment, please call your pharmacy.

## 2017-12-29 ENCOUNTER — Ambulatory Visit (INDEPENDENT_AMBULATORY_CARE_PROVIDER_SITE_OTHER): Payer: Medicare Other | Admitting: *Deleted

## 2017-12-29 DIAGNOSIS — Z7901 Long term (current) use of anticoagulants: Secondary | ICD-10-CM

## 2017-12-29 DIAGNOSIS — Z952 Presence of prosthetic heart valve: Secondary | ICD-10-CM

## 2017-12-29 DIAGNOSIS — Q211 Atrial septal defect: Secondary | ICD-10-CM

## 2017-12-29 DIAGNOSIS — Q2112 Patent foramen ovale: Secondary | ICD-10-CM

## 2017-12-29 DIAGNOSIS — K317 Polyp of stomach and duodenum: Secondary | ICD-10-CM | POA: Diagnosis not present

## 2017-12-29 LAB — POCT INR: INR: 2.5 (ref 2.0–3.0)

## 2017-12-29 NOTE — Patient Instructions (Signed)
Description   Today take 2 tablets, tomorrow take 1.5 tablets, then Continue taking 1 tablet daily except 1.5 tablets on Mondays, Wednesdays, and Fridays..  Recheck INR in 2 weeks.

## 2018-01-06 ENCOUNTER — Encounter: Payer: Medicare Other | Admitting: Psychology

## 2018-01-12 ENCOUNTER — Ambulatory Visit (INDEPENDENT_AMBULATORY_CARE_PROVIDER_SITE_OTHER): Payer: Medicare Other | Admitting: *Deleted

## 2018-01-12 DIAGNOSIS — Z952 Presence of prosthetic heart valve: Secondary | ICD-10-CM

## 2018-01-12 DIAGNOSIS — Z7901 Long term (current) use of anticoagulants: Secondary | ICD-10-CM

## 2018-01-12 DIAGNOSIS — Q2112 Patent foramen ovale: Secondary | ICD-10-CM

## 2018-01-12 DIAGNOSIS — Q211 Atrial septal defect: Secondary | ICD-10-CM

## 2018-01-12 LAB — POCT INR: INR: 4 — AB (ref 2.0–3.0)

## 2018-01-12 NOTE — Patient Instructions (Signed)
Description   Take 1/2 tablet today then continue taking 1 tablet daily except 1.5 tablets on Mondays, Wednesdays, and Fridays.  Recheck INR in 2 weeks.

## 2018-01-17 DIAGNOSIS — G894 Chronic pain syndrome: Secondary | ICD-10-CM | POA: Diagnosis not present

## 2018-01-17 DIAGNOSIS — Z8673 Personal history of transient ischemic attack (TIA), and cerebral infarction without residual deficits: Secondary | ICD-10-CM | POA: Diagnosis not present

## 2018-01-17 DIAGNOSIS — M47814 Spondylosis without myelopathy or radiculopathy, thoracic region: Secondary | ICD-10-CM | POA: Diagnosis not present

## 2018-01-18 DIAGNOSIS — M47814 Spondylosis without myelopathy or radiculopathy, thoracic region: Secondary | ICD-10-CM | POA: Insufficient documentation

## 2018-01-26 ENCOUNTER — Ambulatory Visit (INDEPENDENT_AMBULATORY_CARE_PROVIDER_SITE_OTHER): Payer: Medicare Other | Admitting: Pharmacist

## 2018-01-26 DIAGNOSIS — Z7901 Long term (current) use of anticoagulants: Secondary | ICD-10-CM | POA: Diagnosis not present

## 2018-01-26 DIAGNOSIS — Q2112 Patent foramen ovale: Secondary | ICD-10-CM

## 2018-01-26 DIAGNOSIS — Q211 Atrial septal defect: Secondary | ICD-10-CM

## 2018-01-26 DIAGNOSIS — Z952 Presence of prosthetic heart valve: Secondary | ICD-10-CM | POA: Diagnosis not present

## 2018-01-26 LAB — POCT INR: INR: 3.1 — AB (ref 2.0–3.0)

## 2018-01-26 NOTE — Patient Instructions (Signed)
Description   Continue taking 1 tablet daily except 1.5 tablets on Mondays, Wednesdays, and Fridays.  Recheck INR in 3 weeks.

## 2018-01-26 NOTE — Progress Notes (Signed)
NEUROPSYCHOLOGICAL EVALUATION   Name:    Tracy Marshall.  Date of Birth:   01-02-1951 Date of Interview:  12/09/2017 Date of Testing:  12/10/2017   Date of Feedback:  01/27/2018       Background Information:  Reason for Referral:  Jovahn Breit. is a 67 y.o. male referred by Dr. Narda Amber to assess his current level of cognitive functioning and assist in differential diagnosis. The current evaluation consisted of a review of available medical records, an interview with the patient and his wife, and the completion of a neuropsychological testing battery. Informed consent was obtained.  History of Presenting Problem:  Mr. Deemer has been followed by Dr. Posey Pronto since May 2016. He has a history of mechanical aortic valve replacement (2001), TIA (2001), and strokes (05/2014, 07/2016). His initial stroke in December 2015 manifested with left side facial droop; he did not seek medical attention for about a month afterwards. MRI brain in January 2016 reportedly showed subacute right insula infarct with 2 foci of punctate left frontal cortex infarcts, and also evidence of old infarcts involving the right temporoparietal region. These findings were concerning for central embolic source. He did experience some memory changes, and MoCA on 01/21/2015 was 22/30. However memory issues slowly improved over the following year and therefore formal neurocognitive evaluation was not performed. In February 2018, he was hospitalized for acute onset of left sided weakness, numbness, and vision changes. CT showed hyperdense basilar and proximal right MCA. CTA head showed large vessel occlusion at the right ICA terminus extending to the right M1 and A1 segments. He underwent thrombectomy with marked improvement. Given large vessel occlusion, thrombus from mechanical valve was again suspected in the setting of subtherapeutic INR. In April 2018, he was found to have asymptomatic RLE DVT on therapeutic anticoagulation.  In May 2019, he underwent PFO closure and removal of gastric polyps. On November 23, 2017, he was admitted for upper GI bleed.   The patient does not have concerns about his cognitive functioning. He admits to occasional word finding difficulty but nothing that interferes with daily life.   Since his last stroke in February 2018, his wife has noticed cognitive changes which have improved to some extent but are still concerning to her. She is not sure if cognitive changes are due to his history of stroke(s), chronic and severe back pain (for about 3 years), or use of pain medications (oxycodone about 3 years). Fortunately the patient's back pain has recently improved somewhat, and along with that (as well as reduced use of oxycodone and improved sleep), his wife has noticed improved cognitive functioning.   The patient's wife reports slower processing, memory lapses (especially when he is tired or stressed), frequent misplacement of items such as his glasses, difficulty comprehending in conversation (at times), and a "harder time figuring things out". She also reports that he gets frustrated very quickly. He used to be very "mellow" and easy-going and now he is frequently irritable. He agrees with this, stating, "Things irritate me quicker." However, recently, with some improvement in pain level and sleep, his mood has improved.   He did experience significant depression for the 2-3 years that his back pain was severe. He endorsed passive suicidal ideation during that time, due to the severity of pain and limitations on daily functioning. He also had a period of reduced appetite and lost about 40 lbs but that has improved. He was having great difficulty sleeping due to pain, but  he is now able to sleep about 6-7 hours straight per night. He does have some daytime fatigue and takes naps.   He has no history of depression or other mental health concern prior to onset of chronic pain 3 years ago. He has never  been seen by a psychiatrist or mental health provider.   He has no history of head injury or concussion.   He has no family history of dementia.   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 His office was closed a year later and he lost his job. He has been retired since then (2011). 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 Marital history: Married with no children Alcohol: Minimal, socially Tobacco: None, never a tobacco user    Medical History:  Past Medical History:  Diagnosis Date  . Anemia    iron  . Arthritis   . Atypical moles    melanomna  . Basal cell carcinoma   . Bilateral carotid artery stenosis 08/03/2014   1-39% right and 40-59% left carotid artery stenosis  . Chronic anticoagulation    for mechanical AVR  . Cluster headaches   . CVA (cerebral vascular accident) ([place]) 07/24/2014    MRI indicating 2 punctate foci of acute infarction.  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   . Diverticulosis   . Dizziness   . Episodic recurrent vertigo 2002 & 2005   carotid dopplers w no clinically significant stenosis  . Fatty liver    hx of elevated hepatic transaminases-negative workup in 2009 except for U/S suggesting fatty liver  . GERD (gastroesophageal reflux disease)   . Gout   . Heart murmur   . Hyperlipidemia   . Hyperlipidemia LDL goal <70 12/05/2015  . Joint pain   . LBBB (left bundle branch block)   . Melanoma ([place])    hx of melanoma and multiple basal cell carcinomas Dr Tonia Brooms  . PFO (patent foramen ovale) 12/05/2015  . Positive ANA (antinuclear antibody)   . S/P AVR (aortic valve replacement)    mechanical  . TIA (transient ischemic attack)     Current medications:  Outpatient  Encounter Medications as of 01/27/2018  Medication Sig  . acetaminophen (TYLENOL) 500 MG tablet Take 1,000 mg by mouth every 6 (six) hours as needed for moderate pain.  . Ascorbic Acid (VITAMIN C PO) Take 1 tablet by mouth daily.   [name] aspirin 81 MG tablet Take 81 mg by mouth daily.  . colchicine 0.6 MG tablet Take 0.6 mg by mouth daily as needed (gout flares).   . diazepam (VALIUM) 2 MG tablet Take 2 mg by mouth at bedtime as needed (for sleep).   . fenofibrate (TRICOR) 145 MG tablet Take 1 tablet (145 mg total) by mouth daily.  . ferrous sulfate 325 (65 FE) MG tablet Take 325 mg by mouth 2 (two) times daily with a meal.  . fluticasone (FLONASE) 50 MCG/ACT nasal spray Place 2 sprays into both nostrils daily.  [name] gabapentin (NEURONTIN) 300 MG capsule Take 300 mg by mouth 3 (three) times daily.  [name] HYDROcodone-acetaminophen (NORCO) 10-325 MG tablet Take 1 tablet by mouth daily as needed (for back pain).   . hydrocortisone cream 1 % Apply 1 application topically 2 (two) times daily as needed  for itching.   . Multiple Vitamins-Minerals (ONE-A-DAY MENS 50+ ADVANTAGE) TABS Take 1 tablet by mouth daily.  . OXcarbazepine (TRILEPTAL) 150 MG tablet Take 300 mg by mouth 2 (two) times daily.   . pantoprazole (PROTONIX) 40 MG tablet Take 40 mg by mouth daily.  . pravastatin (PRAVACHOL) 40 MG tablet Take 1 tablet (40 mg total) by mouth daily.  . ranitidine (ZANTAC) 75 MG tablet Take 75 mg by mouth daily as needed for heartburn.   . warfarin (COUMADIN) 3 MG tablet TAKE AS DIRECTED BY COUMADIN CLINIC (Patient taking differently: Take 3 mg by mouth daily on Sunday, Tuesday, Thursday and Saturday. Take 1.5 mg by mouth daily on Monday, Wednesday and Friday)   No facility-administered encounter medications on file as of 01/27/2018.      Current Examination:  Behavioral Observations:  Appearance: Neatly and appropriately dressed and groomed Gait: Ambulated independently, no gross abnormalities observed Speech:  Fluent; normal rate, rhythm and volume. No significant word finding difficulty. Thought process: Linear, goal directed Affect: Mildly blunted Interpersonal: Very pleasant, appropriate Orientation: Oriented to all spheres. Accurately named the current President and his predecessor.   Tests Administered: . Test of Premorbid Functioning (TOPF) . Wechsler Adult Intelligence Scale-Fourth Edition (WAIS-IV): Similarities, Block Design, Arithmetic, Coding and Digit Span subtests . Wechsler Memory Scale-Fourth Edition (WMS-IV) Older Adult Version (ages 1-90): Logical Memory I, II and Recognition subtests  . [employer] Verbal Learning Test - 2nd Edition (CVLT-2) Short Form . Repeatable Battery for the Assessment of Neuropsychological Status (RBANS) Form A:  Figure Copy and Recall subtests and Semantic Fluency subtest . Boston Naming Test (BNT) . Boston Diagnostic Aphasia Examination: Complex Ideational Material subtest . Controlled Oral Word Association Test (COWAT) . Trail Making Test A and B . Clock drawing test . Beck Depression Inventory -2nd Edition (BDI-II) . Generalized Anxiety Disorder - 7 item screener (GAD-7)  Test Results: Note: Standardized scores are presented only for use by appropriately trained professionals and to allow for any future test-retest comparison. These scores should not be interpreted without consideration of all the information that is contained in the rest of the report. The most recent standardization samples from the test publisher or other sources were used whenever possible to derive standard scores; scores were corrected for age, gender, ethnicity and education when available.   Test Scores:  Test Name Raw Score Standardized Score Descriptor  TOPF 62/70 SS= 119 High average  WAIS-IV Subtests     Similarities 20/36 ss= 8 Average  Block Design 24/66 ss= 8 Average  Arithmetic 15/22 ss= 11 Average  Coding 64/135 ss= 12 High average  Digit Span Forward 28/48 ss=  11 Average  WAIS-IV Index Score     Working Memory  SS= 105 Average  WMS-IV Subtests     LM I 35/53 ss= 11 Average  LM II 23/39 ss= 12 High average  LM II Recognition 19/23 Cum %: 51-75 WNL  RBANS Subtests     Figure Copy 17/20 Z= -0.7 Average  Figure Recall 14/20 Z= 0.1 Average  Semantic Fluency 12 Z= -2 Impaired  CVLT-II Scores     Trial 1 5/16 Z= -0.5 Average  Trial 5 9/16 Z= -0.5 Average  Trials 1-5 total 37/80 T= 46 Average   SD Free Recall 8/16 Z= 0 Average  SD Cued Recall 9/16 Z= -0.5 Average  LD Free Recall 9/16 Z= 0 Average  LD Cued Recall 8/16 Z= -0.5 Average  Recognition Discriminability 12/16 hits 1 false positive Z= 0 Average  Forced Choice Recognition 16/16  WNL  BNT 57/60 T= 54 Average  BDAE Subtest     Complex Ideational Material 12/12  WNL  COWAT-FAS 26 T= 37 Low average  COWAT-Animals 12 T= 35 Borderline  Trail Making Test A  22" 0 errors T= 62 High average  Trail Making Test B  41" 0 errors T= 60 High average  Clock Drawing   WNL  BDI-II 16/63  Mild  GAD-7 4/21  WNL      Description of Test Results:  Embedded performance validity indicators were within normal limits. As such, the patient's current performance on neurocognitive testing is judged to be a relatively accurate representation of his current level of neurocognitive functioning.   Premorbid verbal intellectual abilities were estimated to have been within the high average range based on a test of word reading. Psychomotor processing speed was high average. Auditory attention and working memory were average. Visual-spatial construction was average. Language abilities were variable. Specifically, confrontation naming was average, while semantic verbal fluency ranged from borderline to impaired. Auditory comprehension of complex ideational material was intact. With regard to verbal memory, encoding and acquisition of non-contextual information (i.e., 16-item word list) was average across five  learning trials. After a brief interference task, free recall was average (8/16 items). Cued recall was average (9/16 items). After a delay, free recall was average with 100% retention across the delay (9/16 items). Cued recall was average. Performance on a yes/no recognition task was average. On another verbal memory test, encoding and acquisition of contextual auditory information (i.e., short stories) was average. After a delay, free recall was high average. Performance on a yes/no recognition task was within normal limits. With regard to non-verbal memory, delayed free recall of visual information was average. Executive functioning was generally within normal limits. Mental flexibility and set-shifting were high average on Trails B. Verbal fluency with phonemic search restrictions was low average. Verbal abstract reasoning was average. Performance on a clock drawing task was normal.   On a self-report measure of mood, the patient's responses were indicative of mild depression at the present time. Symptoms endorsed included: anhedonia, pessimism, self-dislike, self-criticalness, tearfulness, restlessness, loss of interest, indecisiveness, worthlessness, loss of energy, reduced sleep, irritability, increased appetite, concentration difficulty, fatigue and reduced libido. On a self-report measure of anxiety, the patient did not endorse clinically significant generalized anxiety at the present time.    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  Test results were mostly within normal limits for age. There was focal impairment in verbal fluency, and I suspect this is due to prior stroke. Otherwise, he demonstrated strong cognitive function, possibly owing to high cognitive reserve. These test performances reveal no sign of a memory disorder. Meanwhile, his  wife reports significant issues with attention and forgetfulness in daily life. I suspect these cognitive changes are due to severe pain, opioid medication for pain, lack of sleep and depression. Fortunately, these factors have greatly improved. He is still experiencing some mild adjustment related depression.   Recommendations/Plan: Based on the findings of the present evaluation, the following recommendations are offered:  1. Education was provided regarding the impact of severe/chronic pain, sleep impairment, and depression on cognitive function in daily life. 2. They were reassured there is no sign of underlying dementia or memory disorder. 3. We spoke at length about enhancing daily functioning and compensating for attention/memory issues he apparently is having in  daily life. I spoke with them about the possibility of cognitive rehabilitation through [facility], and about the possibility of marriage counseling. I think marriage counseling may be very beneficial given the significant number of stressors the couple has been dealing with in recent years. It could also assist them in implementing strategies to help the patient compensate for reported cognitive difficulties, thereby reducing marital tension related to those difficulties. They will think about this and discuss further with Dr. Posey Pronto. If they are interested in marriage counseling, they may consider [facility], and a copy of this report could be sent to their therapist. I also reviewed a list of strategies to help enhance cognitive functioning in daily life. 4.If there is any change in cognitive functioning reported or observed in the future, these test results can be used in test-retest comparison.   Feedback to Patient: Yusef Lamp. and his wife returned for a feedback appointment on 01/27/2018 to review the results of his neuropsychological evaluation with this provider. 45 minutes face-to-face time was spent  reviewing his test results, my impressions and my recommendations as detailed above.    Total time spent on this patient's case: 95 minutes for neurobehavioral status exam with psychologist (CPT code [id number], [phone] unit); 90 minutes of testing/scoring by psychometrician under psychologist's supervision (CPT codes [phone], [phone] units); 180 minutes for integration of patient data, interpretation of standardized test results and clinical data, clinical decision making, treatment planning and preparation of this report, and interactive feedback with review of results to the patient/family by psychologist (CPT codes [id number], [phone] units).      Thank you for your referral of Izaan Kingbird.. Please feel free to contact me if you have any questions or concerns regarding this report.

## 2018-01-27 ENCOUNTER — Encounter: Payer: Self-pay | Admitting: Psychology

## 2018-01-27 ENCOUNTER — Ambulatory Visit (INDEPENDENT_AMBULATORY_CARE_PROVIDER_SITE_OTHER): Payer: Medicare Other | Admitting: Psychology

## 2018-01-27 DIAGNOSIS — G3184 Mild cognitive impairment, so stated: Secondary | ICD-10-CM

## 2018-01-27 DIAGNOSIS — I6930 Unspecified sequelae of cerebral infarction: Secondary | ICD-10-CM

## 2018-01-27 DIAGNOSIS — F4321 Adjustment disorder with depressed mood: Secondary | ICD-10-CM

## 2018-02-01 ENCOUNTER — Encounter (HOSPITAL_COMMUNITY): Payer: Medicare Other

## 2018-02-04 ENCOUNTER — Ambulatory Visit (HOSPITAL_COMMUNITY)
Admission: RE | Admit: 2018-02-04 | Discharge: 2018-02-04 | Disposition: A | Discharge status: Home / Self Care | Payer: Medicare Other | Source: Ambulatory Visit | Attending: Cardiology | Admitting: Cardiology

## 2018-02-04 DIAGNOSIS — I6523 Occlusion and stenosis of bilateral carotid arteries: Secondary | ICD-10-CM | POA: Diagnosis not present

## 2018-02-07 ENCOUNTER — Encounter: Payer: Self-pay | Admitting: Neurology

## 2018-02-07 ENCOUNTER — Ambulatory Visit (INDEPENDENT_AMBULATORY_CARE_PROVIDER_SITE_OTHER): Payer: Medicare Other | Admitting: Neurology

## 2018-02-07 VITALS — BP 110/80 | HR 75 | Ht 70.0 in | Wt 192.1 lb

## 2018-02-07 DIAGNOSIS — I6930 Unspecified sequelae of cerebral infarction: Secondary | ICD-10-CM

## 2018-02-07 DIAGNOSIS — G3184 Mild cognitive impairment, so stated: Secondary | ICD-10-CM | POA: Diagnosis not present

## 2018-02-07 DIAGNOSIS — I6523 Occlusion and stenosis of bilateral carotid arteries: Secondary | ICD-10-CM

## 2018-02-07 NOTE — Progress Notes (Signed)
Follow-up Visit   Date: 02/07/18    Tracy Marshall. MRN: [id number] DOB: Aug 24, 1950   Interim History: Tracy Marshall. is a 67 y.o. right-handed Caucasian male with s/p AVR with mechanical valve (2001) on chronic anticoagulation with coumadin, GERD, melanoma and basal cell carcinoma of the skin, and carotid stenosis (L 40-59% and R 1-39%), and history of stroke x 2 (2016 and 2018), s/p PFO closure here for follow-up of memory changes.  The patient was accompanied to the clinic by wife who also provides collateral information.    History of present illness: In 2001, he developed an episode of slurred speech lasting a few minutes. This occurred several weeks after his valve replacement and was told he had a TIA. He had no residual deficits.  In December 2015, he was visiting his brother and a family member had noticed that he developed left facial droop.  MRI brain in January 2016 showed subacute right insula infarct with 2 foci of punctate left frontal cortex infarcts. There is evidence of old infarcts involving the right temporoparietal region also. These findings are most concerning for central embolic source. However, his TEE did not show any thrombus or unstable plaque of the aortic arch.   In February 2018, he was hospitalized for acute onset of left sided weakness, numbness, and vision changes.  CT showed hyperdense basilar and proximal right MCA.  CTA head showed large vessel occlusion at the right ICA terminus extending to the right M1 and A1 segments.  Due to his INR 2.3, IVtPA was not administered and he was managed with mechanical embolectomy at [facility]. He felt almost immediate improvement and was able to walk the following day.   Echo did not show any thrombus, EF 55-60% and small PFO.  LDL 58 and he was continued on pravastatin 40mg  and Tricor 145mg .  He has been taking aspirin 81mg  for the past year.  Etiology was thought to be secondary to central embolic source  in the setting of subtherapeutic INR.  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    His wife complains of worsening short-term memory.  He nearly always forgets where is placed his glasses, misses his medications so his wife manages it, and easily forgets details.  He is on a number of pain medications and wife thinks this may be the problem.  He does not sleep well at night because of pain and tends to nap during the day.  She also states that he is more irritable and grumpy.   UPDATE 02/07/2018:  He is here for follow-up visit and is doing well.  He had PFO closure in May and did well. In June he was hospitalized with syncope and melena.   He had cognitive testing with Dr. 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  Patient and wife are very relieved and have already started seeing memory improvement since knowing that he does not have dementia.  Overall, he is doing well with no new complaints.   Medications:  Current Outpatient Medications on File Prior to Visit  Medication Sig Dispense Refill  . acetaminophen (TYLENOL) 500 MG tablet Take 1,000 mg by mouth every 6 (six) hours as needed for moderate pain.    . Ascorbic Acid (VITAMIN C PO) Take 1 tablet by mouth daily.     [name] aspirin  81 MG tablet Take 81 mg by mouth daily.    . Aspirin-Calcium Carbonate 81-777 MG TABS Take by mouth.    . colchicine 0.6 MG tablet Take 0.6 mg by mouth daily as needed (gout flares).     . diazepam (VALIUM) 2 MG tablet Take 2 mg by mouth at bedtime as needed (for sleep).     . enoxaparin (LOVENOX) 80 MG/0.8ML injection enoxaparin 80 mg/0.8 mL subcutaneous syringe  INJECT 0.8 MLS (80 MG TOTAL) INTO THE SKIN EVERY 12 (TWELVE) HOURS.    . fenofibrate (TRICOR) 145 MG tablet Take 1 tablet (145 mg total) by mouth daily. 90 tablet 3  . ferrous sulfate 325 (65 FE) MG tablet  Take 325 mg by mouth 2 (two) times daily with a meal.    . fluticasone (FLONASE) 50 MCG/ACT nasal spray Place 2 sprays into both nostrils daily.    [name] gabapentin (NEURONTIN) 300 MG capsule Take 300 mg by mouth 3 (three) times daily.    [name] HYDROcodone-acetaminophen (NORCO) 10-325 MG tablet Take 1 tablet by mouth daily as needed (for back pain).     . hydrocortisone cream 1 % Apply 1 application topically 2 (two) times daily as needed for itching.     [name] lisinopril (PRINIVIL,ZESTRIL) 5 MG tablet Take by mouth.    . methocarbamol (ROBAXIN) 500 MG tablet methocarbamol 500 mg tablet    . Multiple Vitamin (MULTIVITAMIN) capsule Take by mouth.    . Multiple Vitamins-Minerals (ONE-A-DAY MENS 50+ ADVANTAGE) TABS Take 1 tablet by mouth daily.    . naloxegol oxalate (MOVANTIK) 25 MG TABS tablet TAKE 1 TABLET BY MOUTH EVERY DAY    . nortriptyline (PAMELOR) 25 MG capsule     . OXcarbazepine (TRILEPTAL) 150 MG tablet Take 300 mg by mouth 2 (two) times daily.     . pantoprazole (PROTONIX) 40 MG tablet Take 40 mg by mouth daily.    . pravastatin (PRAVACHOL) 40 MG tablet Take 1 tablet (40 mg total) by mouth daily. 90 tablet 3  . pregabalin (LYRICA) 150 MG capsule Lyrica 150 mg capsule    . ranitidine (ZANTAC) 75 MG tablet Take 75 mg by mouth daily as needed for heartburn.     . warfarin (COUMADIN) 3 MG tablet TAKE AS DIRECTED BY COUMADIN CLINIC (Patient taking differently: Take 3 mg by mouth daily on Sunday, Tuesday, Thursday and Saturday. Take 1.5 mg by mouth daily on Monday, Wednesday and Friday) 30 tablet 3   No current facility-administered medications on file prior to visit.     Allergies: No Known Allergies  Review of Systems:  CONSTITUTIONAL: No fevers, chills, night sweats, or weight loss.  EYES: No visual changes or eye pain ENT: No hearing changes.  No history of nose bleeds.   RESPIRATORY: No cough, wheezing and shortness of breath.   CARDIOVASCULAR: Negative for chest pain, and palpitations.     GI: Negative for abdominal discomfort, blood in stools or black stools.  No recent change in bowel habits.   GU:  No history of incontinence.   MUSCLOSKELETAL: +history of joint pain or swelling.  No myalgias.   SKIN: Negative for lesions, rash, and itching.   ENDOCRINE: Negative for cold or heat intolerance, polydipsia or goiter.   PSYCH:  + depression or anxiety symptoms.   NEURO: As Above.   Vital Signs:  BP 110/80   Pulse 75   Ht 5\' 10"  (1.778 m)   Wt 192 lb 2 oz (87.1 kg)   SpO2 96%  BMI 27.57 kg/m   General Medical Exam:   General:  Well appearing, comfortable  Eyes/ENT: see cranial nerve examination.   Neck: No masses appreciated.  Full range of motion without tenderness.  No carotid bruits. Respiratory:  Clear to auscultation, good air entry bilaterally.   Cardiac:  Regular rate and rhythm, no murmur.   Ext:  No edema, healing bruise over the left posterior lower leg  Neurological Exam: MENTAL STATUS including orientation to time, place, person, recent and remote memory, attention span and concentration, language, and fund of knowledge is intact.  Speech is not dysarthric.  CRANIAL NERVES:  No visual field defects. Pupils equal round and reactive to light.  Normal conjugate, extra-ocular eye movements in all directions of gaze.  No ptosis. Face is symmetric. Palate elevates symmetrically.  Tongue is midline.  MOTOR:  Motor strength is 5/5 in all extremities.  No pronator drift.  Tone is normal.    MSRs:  Reflexes are 2+/4 throughout.  SENSORY:  Vibration mildly reduced at ankles bilaterally  COORDINATION/GAIT:  Normal finger-to- nose-finger.  Intact rapid alternating movements bilaterally.  Gait narrow based and stable.   Data: MRI brain wo contrast 07/21/2014: 2 punctate foci of acute infarction in the left posterior frontal cortex without hemorrhage or mass effect. Late subacute infarction in the right insula and frontal opercular region. Old right  temporoparietal cortical and subcortical infarction. Infarctions of different age in both hemispheres suggest embolic infarctions, possibly from the heart or ascending aorta or both carotid bifurcation regions.   TEE 09/2014:  Low normal LV function; mild LAE; prosthetic aortic valve with mild AI; mild MR and TR; atrial septal aneurysm with positivesaline microcavitation study consistent with PFO.  Cardiac monitoring 12/2014:  Heart monitor with no PAF.  Sinus bradycardia as low as 40 bpm.  Normal sinus rhythm.  Sinus tachycardia as high as 120 bpm.  Frequent PAC's and PVC's, ventricular couplets, atrial couplets  IR Stroke 08/04/2016:  Right carotid terminus embolus removed with mechanical embolectomy. Intracranial atherosclerosis involving the distal right middle cerebral artery and posterior division origin.  Neurocognitive testing 01/27/2018: Clinical Impressions: Single-domain, non-amnestic mild cognitive impairment (reduced verbal fluency), likely an effect of prior CVA. 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   IMPRESSION/PLAN: Mild cognitive impairment in the setting of prior stroke and contributed by depression.  Reassured patient there is no evidence of dementia, which they are relieved with.  Encouraged him to stay active and engage in mentally stimulating activities.  History of ischemic stroke in the setting of mechanical aortic valve on coumadin.  Clinically with no residual deficits. - January 2016 - left facial weakness, imaging showed small embolic stroke on the right frontal cortex >> cardioembolic from subtherapeutic INR.  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  Given large vessel occlusion, again thrombus from mechanical valve is suspected in the setting of subtherapeutic INR. - s/p PFO closure in May 2019 - Continue coumadin and ASA - Continue pravastatin 40mg  and Tricor 145mg  daily - BP is well  controlled  Return to clinic in 9 months   Thank you for allowing me to participate in patient's care.  If I can answer any additional questions, I would be pleased to do so.    Sincerely,    Baylor Cortez K. Posey Pronto, DO

## 2018-02-07 NOTE — Patient Instructions (Signed)
Return to clinic in 9 months 

## 2018-02-16 ENCOUNTER — Ambulatory Visit (INDEPENDENT_AMBULATORY_CARE_PROVIDER_SITE_OTHER): Payer: Medicare Other | Admitting: *Deleted

## 2018-02-16 DIAGNOSIS — Z7901 Long term (current) use of anticoagulants: Secondary | ICD-10-CM

## 2018-02-16 DIAGNOSIS — Q211 Atrial septal defect: Secondary | ICD-10-CM

## 2018-02-16 DIAGNOSIS — Z952 Presence of prosthetic heart valve: Secondary | ICD-10-CM

## 2018-02-16 DIAGNOSIS — Q2112 Patent foramen ovale: Secondary | ICD-10-CM

## 2018-02-16 LAB — POCT INR: INR: 5.4 — AB (ref 2.0–3.0)

## 2018-02-16 NOTE — Patient Instructions (Addendum)
Description   Do not take any Coumadin today and No Coumadin tomorrow then continue taking 1 tablet daily except 1.5 tablets on Mondays, Wednesdays, and Fridays.  Recheck INR in 3 weeks after vacation per request.

## 2018-02-25 ENCOUNTER — Encounter: Payer: Self-pay | Admitting: Neurology

## 2018-03-07 DIAGNOSIS — M47814 Spondylosis without myelopathy or radiculopathy, thoracic region: Secondary | ICD-10-CM | POA: Diagnosis not present

## 2018-03-07 DIAGNOSIS — Z85820 Personal history of malignant melanoma of skin: Secondary | ICD-10-CM | POA: Diagnosis not present

## 2018-03-07 DIAGNOSIS — Z8673 Personal history of transient ischemic attack (TIA), and cerebral infarction without residual deficits: Secondary | ICD-10-CM | POA: Diagnosis not present

## 2018-03-09 ENCOUNTER — Ambulatory Visit (INDEPENDENT_AMBULATORY_CARE_PROVIDER_SITE_OTHER): Payer: Medicare Other | Admitting: Pharmacist

## 2018-03-09 DIAGNOSIS — Q211 Atrial septal defect: Secondary | ICD-10-CM

## 2018-03-09 DIAGNOSIS — Z7901 Long term (current) use of anticoagulants: Secondary | ICD-10-CM

## 2018-03-09 DIAGNOSIS — Z952 Presence of prosthetic heart valve: Secondary | ICD-10-CM

## 2018-03-09 DIAGNOSIS — Q2112 Patent foramen ovale: Secondary | ICD-10-CM

## 2018-03-09 LAB — POCT INR: INR: 5.8 — AB (ref 2.0–3.0)

## 2018-03-09 IMAGING — CT CT HEAD CODE STROKE
3 series · 15 of 47 positions shown, 18 images · non-contrast
Comparison: MRI 07/21/2014

CLINICAL DATA: Code stroke.

EXAM:
CT HEAD WITHOUT CONTRAST
TECHNIQUE: Contiguous axial images were obtained from the base of the skull
through the vertex without intravenous contrast.

[Series 2: head 5.0 st · axial · 0.46mm/px · z∈[-108,+52]mm · 9 of 38 slices shown, 12 images]
[im 3/38  brain]
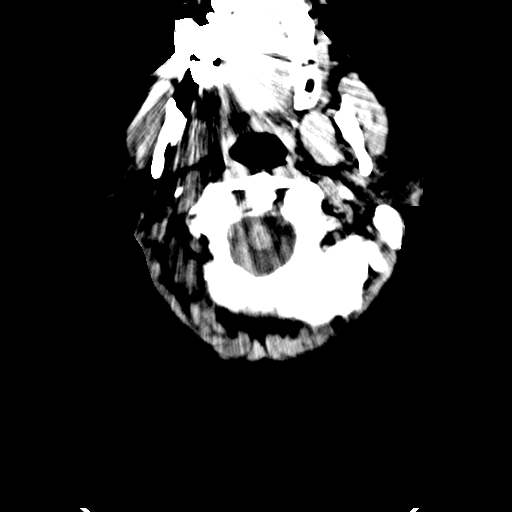
[im 3/38  bone]
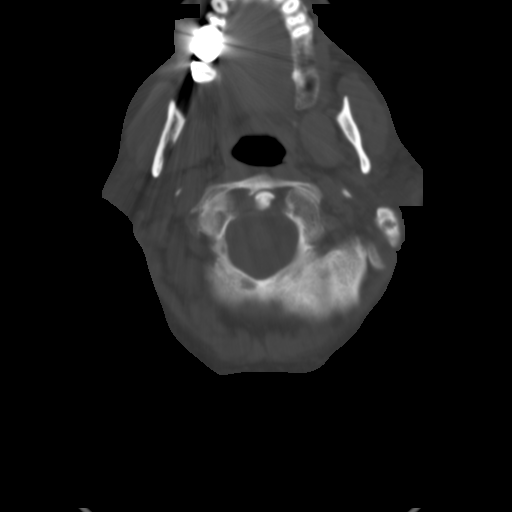
[im 7/38  brain]
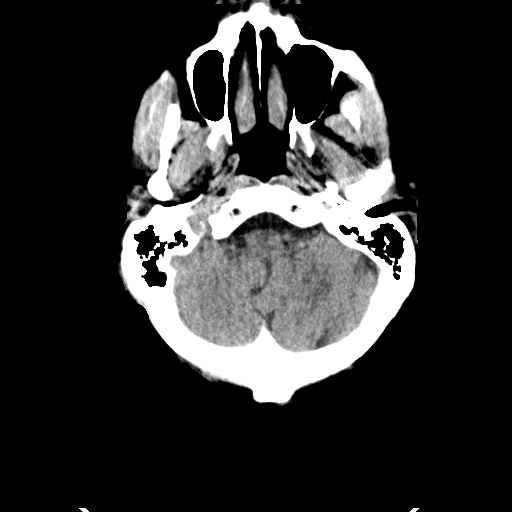
[im 11/38  brain]
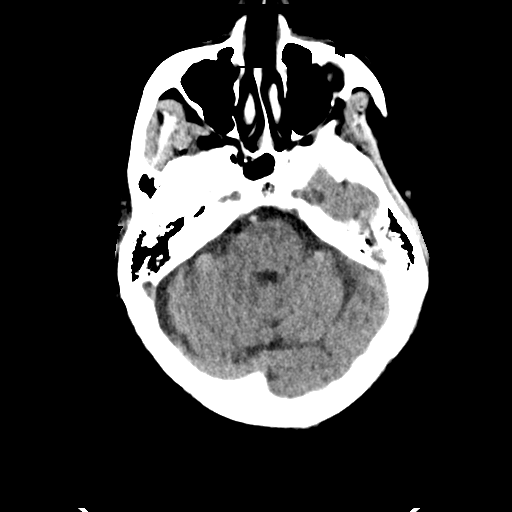
[im 15/38  brain]
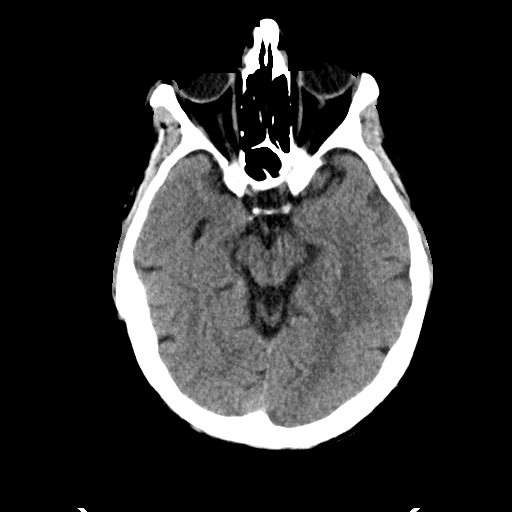
[im 20/38  brain]
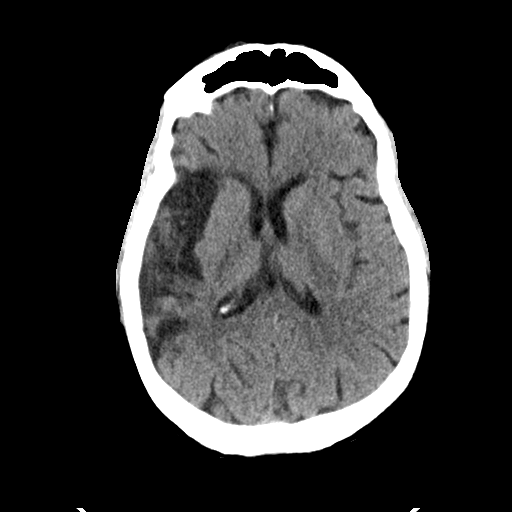
[im 20/38  bone]
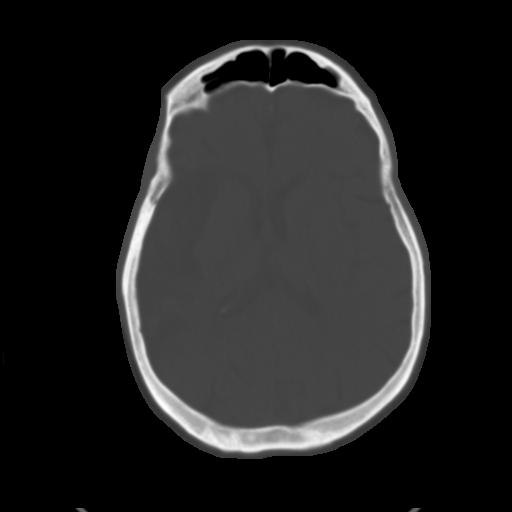
[im 23/38  brain]
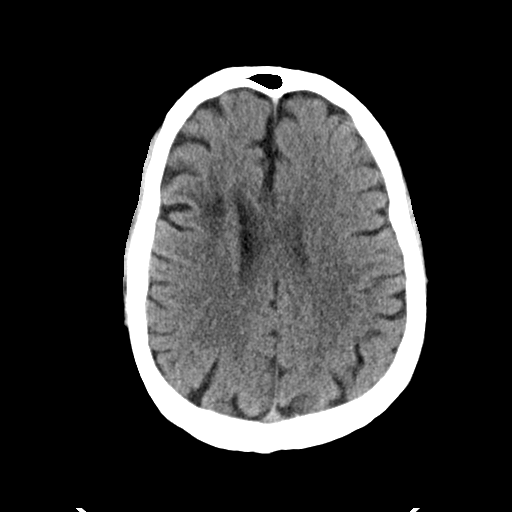
[im 27/38  brain]
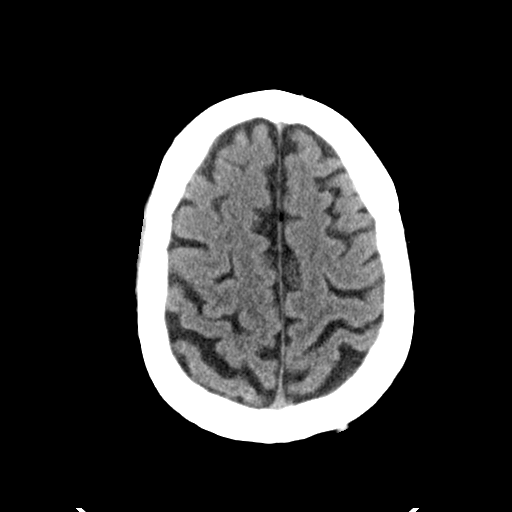
[im 31/38  brain]
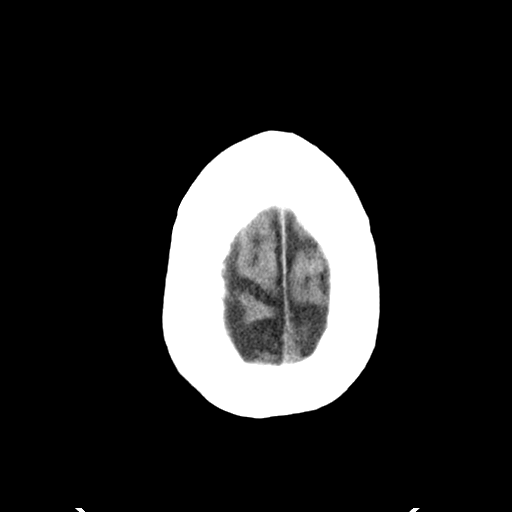
[im 35/38  brain]
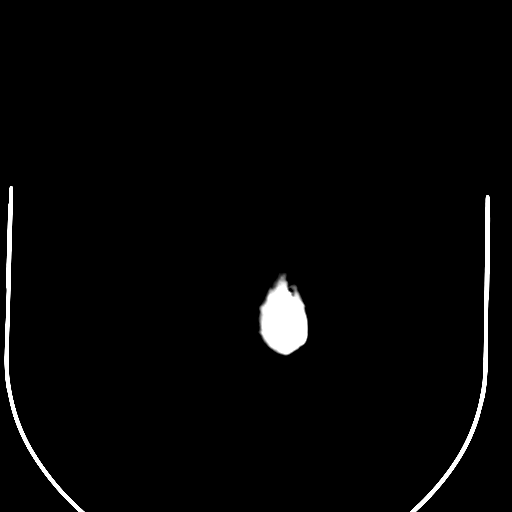
[im 35/38  bone]
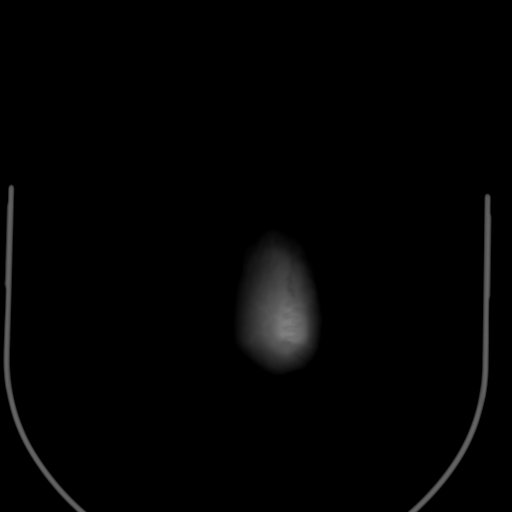

[Series 4: head 3.0 cor st · coronal · 0.38mm/px · 3 of 75 slices shown]
[im 25/75  brain]
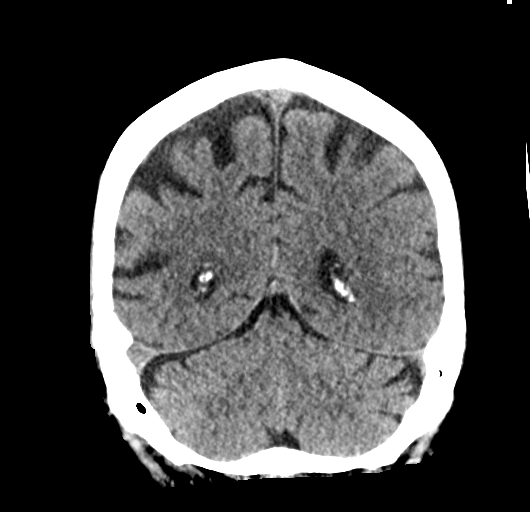
[im 33/75  brain]
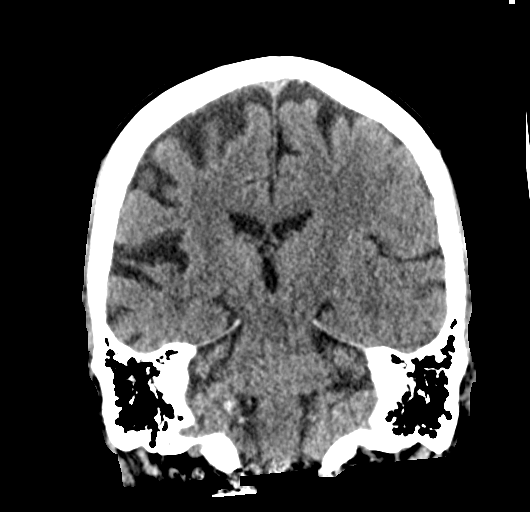
[im 42/75  brain]
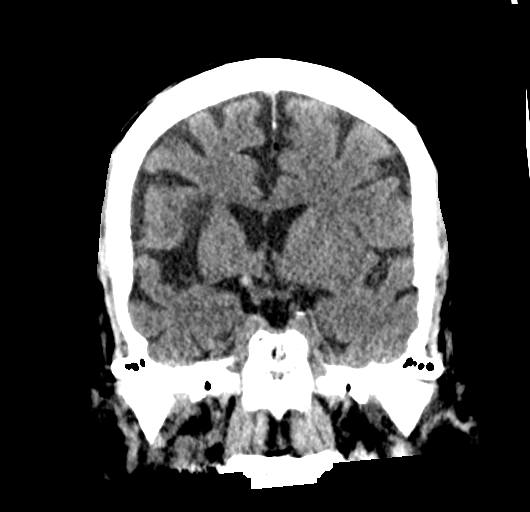

[Series 5: head 3.0 sag st · sagittal · 0.39mm/px · 3 of 67 slices shown]
[im 23/67  brain]
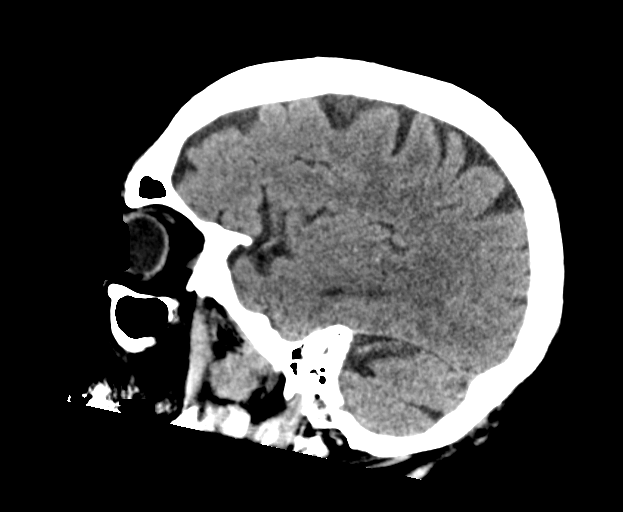
[im 34/67  brain]
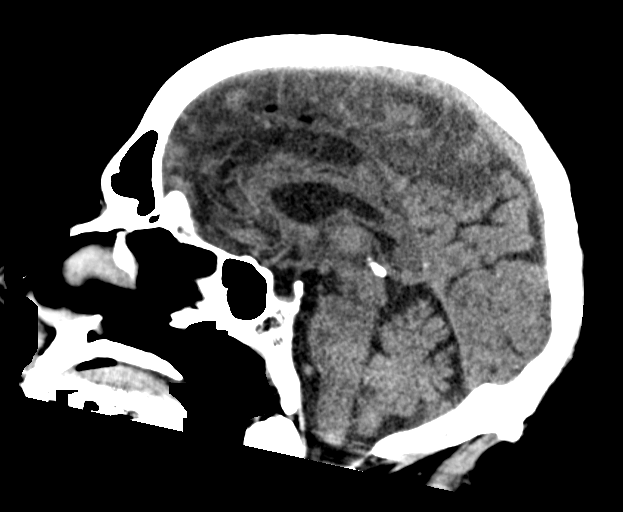
[im 45/67  brain]
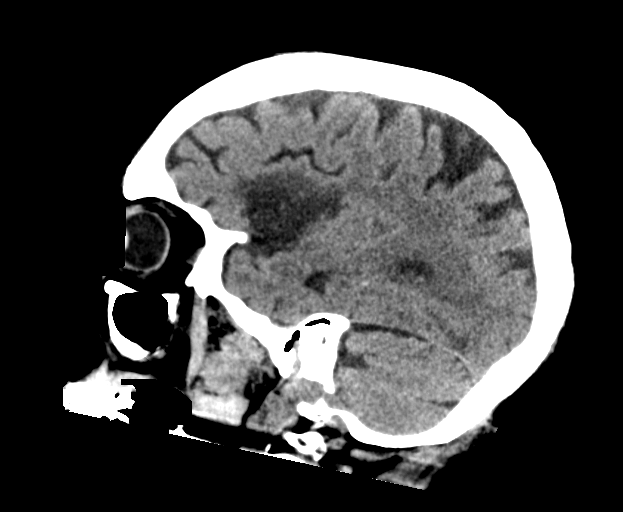

[15 of 47 positions shown; findings below may reference images not displayed]

FINDINGS: Brain: Chronic infarct in the right frontal operculum and right
insular cortex. Negative for acute infarct. Negative for hemorrhage
or mass. Ventricle size normal with mild atrophy. No shift of the
midline structures.

Vascular: Increased density in the mid basilar which could be due to
thrombosis. There is also focal increased density in the proximal
right M1 segment which could be due to thrombus. CTA head and neck
suggest for further evaluation.

Skull: Negative

Sinuses/Orbits: Negative

Other: None

ASPECTS (Alberta Stroke Program Early CT Score)

- Ganglionic level infarction (caudate, lentiform nuclei, internal
capsule, insula, M1-M3 cortex): 7

- Supraganglionic infarction (M4-M6 cortex): 3

Total score (0-10 with 10 being normal): 10
IMPRESSION: 1. Negative for intracranial hemorrhage.  No acute infarct by CT
2. ASPECTS is 10
3. Chronic right frontal operculum and insular infarct.
4. Hyperdense basilar and proximal right MCA. This could represent
acute thrombosis or chronic atherosclerotic disease. CTA head and
neck suggest for further evaluation.

These results were called by telephone at the time of interpretation
on 08/04/2016 at [DATE] to Dr. CHARALAMBIA SAPARILLA , who verbally
acknowledged these results.

## 2018-03-09 NOTE — Patient Instructions (Signed)
Description   Do not take any Coumadin today and tomorrow then take only 1/2 tablet on Friday then change dose to 1 tablet daily except 1.5 tablets on Mondays and Fridays. Recheck INR in 7 days.

## 2018-03-16 ENCOUNTER — Ambulatory Visit (INDEPENDENT_AMBULATORY_CARE_PROVIDER_SITE_OTHER): Payer: Medicare Other | Admitting: *Deleted

## 2018-03-16 DIAGNOSIS — Q2112 Patent foramen ovale: Secondary | ICD-10-CM

## 2018-03-16 DIAGNOSIS — Z952 Presence of prosthetic heart valve: Secondary | ICD-10-CM

## 2018-03-16 DIAGNOSIS — Q211 Atrial septal defect: Secondary | ICD-10-CM

## 2018-03-16 DIAGNOSIS — Z7901 Long term (current) use of anticoagulants: Secondary | ICD-10-CM

## 2018-03-16 LAB — POCT INR: INR: 3.2 — AB (ref 2.0–3.0)

## 2018-03-16 NOTE — Patient Instructions (Signed)
Description   Continue taking 1 tablet daily except 1.5 tablets on Mondays and Fridays. Recheck INR in 2 weeks.

## 2018-03-17 DIAGNOSIS — H2513 Age-related nuclear cataract, bilateral: Secondary | ICD-10-CM | POA: Diagnosis not present

## 2018-03-22 DIAGNOSIS — Z23 Encounter for immunization: Secondary | ICD-10-CM | POA: Diagnosis not present

## 2018-04-04 ENCOUNTER — Ambulatory Visit (INDEPENDENT_AMBULATORY_CARE_PROVIDER_SITE_OTHER): Payer: Medicare Other

## 2018-04-04 DIAGNOSIS — Z7901 Long term (current) use of anticoagulants: Secondary | ICD-10-CM

## 2018-04-04 DIAGNOSIS — Q2112 Patent foramen ovale: Secondary | ICD-10-CM

## 2018-04-04 DIAGNOSIS — Q211 Atrial septal defect: Secondary | ICD-10-CM

## 2018-04-04 DIAGNOSIS — Z952 Presence of prosthetic heart valve: Secondary | ICD-10-CM

## 2018-04-04 LAB — POCT INR: INR: 4.2 — AB (ref 2.0–3.0)

## 2018-04-04 NOTE — Patient Instructions (Signed)
Description   Skip today's dosage of Coumadin, then resume same dosage 1 tablet daily except 1.5 tablets on Mondays and Fridays. Recheck INR in 3 weeks.

## 2018-04-05 DIAGNOSIS — G894 Chronic pain syndrome: Secondary | ICD-10-CM | POA: Diagnosis not present

## 2018-04-05 DIAGNOSIS — Z5181 Encounter for therapeutic drug level monitoring: Secondary | ICD-10-CM | POA: Diagnosis not present

## 2018-04-05 DIAGNOSIS — Z79899 Other long term (current) drug therapy: Secondary | ICD-10-CM | POA: Diagnosis not present

## 2018-04-25 ENCOUNTER — Ambulatory Visit (INDEPENDENT_AMBULATORY_CARE_PROVIDER_SITE_OTHER): Payer: Medicare Other

## 2018-04-25 DIAGNOSIS — Z952 Presence of prosthetic heart valve: Secondary | ICD-10-CM | POA: Diagnosis not present

## 2018-04-25 DIAGNOSIS — Q211 Atrial septal defect: Secondary | ICD-10-CM

## 2018-04-25 DIAGNOSIS — Z7901 Long term (current) use of anticoagulants: Secondary | ICD-10-CM | POA: Diagnosis not present

## 2018-04-25 DIAGNOSIS — Q2112 Patent foramen ovale: Secondary | ICD-10-CM

## 2018-04-25 LAB — POCT INR: INR: 5.3 — AB (ref 2.0–3.0)

## 2018-04-25 NOTE — Patient Instructions (Signed)
Description   Skip today and tomorrow's dosage of Coumadin, then start taking 1 tablet daily.  Recheck INR in 10 days.

## 2018-05-05 ENCOUNTER — Ambulatory Visit (INDEPENDENT_AMBULATORY_CARE_PROVIDER_SITE_OTHER): Payer: Medicare Other

## 2018-05-05 DIAGNOSIS — Q211 Atrial septal defect: Secondary | ICD-10-CM

## 2018-05-05 DIAGNOSIS — Z952 Presence of prosthetic heart valve: Secondary | ICD-10-CM

## 2018-05-05 DIAGNOSIS — Z7901 Long term (current) use of anticoagulants: Secondary | ICD-10-CM | POA: Diagnosis not present

## 2018-05-05 DIAGNOSIS — Q2112 Patent foramen ovale: Secondary | ICD-10-CM

## 2018-05-05 LAB — POCT INR: INR: 3.1 — AB (ref 2.0–3.0)

## 2018-05-05 NOTE — Patient Instructions (Signed)
Description   Continue on same dosage 1 tablet daily.  Recheck INR in 2 weeks.

## 2018-05-09 ENCOUNTER — Ambulatory Visit: Payer: Medicare Other | Admitting: Cardiology

## 2018-05-27 DIAGNOSIS — L814 Other melanin hyperpigmentation: Secondary | ICD-10-CM | POA: Diagnosis not present

## 2018-05-27 DIAGNOSIS — Z85828 Personal history of other malignant neoplasm of skin: Secondary | ICD-10-CM | POA: Diagnosis not present

## 2018-05-27 DIAGNOSIS — C44319 Basal cell carcinoma of skin of other parts of face: Secondary | ICD-10-CM | POA: Diagnosis not present

## 2018-05-27 DIAGNOSIS — D229 Melanocytic nevi, unspecified: Secondary | ICD-10-CM | POA: Diagnosis not present

## 2018-05-27 DIAGNOSIS — D1801 Hemangioma of skin and subcutaneous tissue: Secondary | ICD-10-CM | POA: Diagnosis not present

## 2018-05-27 DIAGNOSIS — D485 Neoplasm of uncertain behavior of skin: Secondary | ICD-10-CM | POA: Diagnosis not present

## 2018-05-31 ENCOUNTER — Ambulatory Visit (INDEPENDENT_AMBULATORY_CARE_PROVIDER_SITE_OTHER): Payer: Medicare Other | Admitting: *Deleted

## 2018-05-31 DIAGNOSIS — M47814 Spondylosis without myelopathy or radiculopathy, thoracic region: Secondary | ICD-10-CM | POA: Diagnosis not present

## 2018-05-31 DIAGNOSIS — Q211 Atrial septal defect: Secondary | ICD-10-CM

## 2018-05-31 DIAGNOSIS — Q2112 Patent foramen ovale: Secondary | ICD-10-CM

## 2018-05-31 DIAGNOSIS — Z7901 Long term (current) use of anticoagulants: Secondary | ICD-10-CM | POA: Diagnosis not present

## 2018-05-31 DIAGNOSIS — Z952 Presence of prosthetic heart valve: Secondary | ICD-10-CM

## 2018-05-31 DIAGNOSIS — G894 Chronic pain syndrome: Secondary | ICD-10-CM | POA: Diagnosis not present

## 2018-05-31 DIAGNOSIS — M5414 Radiculopathy, thoracic region: Secondary | ICD-10-CM | POA: Diagnosis not present

## 2018-05-31 LAB — POCT INR: INR: 4.9 — AB (ref 2.0–3.0)

## 2018-05-31 NOTE — Patient Instructions (Signed)
Description   Do not take any Coumadin today and tomorrow take 1/2 tablet then continue on same dosage 1 tablet daily.  Recheck INR in 2 weeks.

## 2018-06-16 ENCOUNTER — Ambulatory Visit (INDEPENDENT_AMBULATORY_CARE_PROVIDER_SITE_OTHER): Payer: Medicare Other | Admitting: *Deleted

## 2018-06-16 DIAGNOSIS — Q211 Atrial septal defect: Secondary | ICD-10-CM

## 2018-06-16 DIAGNOSIS — Z952 Presence of prosthetic heart valve: Secondary | ICD-10-CM | POA: Diagnosis not present

## 2018-06-16 DIAGNOSIS — Z7901 Long term (current) use of anticoagulants: Secondary | ICD-10-CM | POA: Diagnosis not present

## 2018-06-16 DIAGNOSIS — Q2112 Patent foramen ovale: Secondary | ICD-10-CM

## 2018-06-16 LAB — POCT INR: INR: 4.6 — AB (ref 2.0–3.0)

## 2018-06-16 NOTE — Patient Instructions (Addendum)
Description   Do not take any Coumadin today then start taking 1 tablet daily except 1/2 tablet on Monday and Friday.  Recheck INR in 2 weeks.

## 2018-06-28 ENCOUNTER — Ambulatory Visit (INDEPENDENT_AMBULATORY_CARE_PROVIDER_SITE_OTHER): Payer: Medicare Other

## 2018-06-28 DIAGNOSIS — Z7901 Long term (current) use of anticoagulants: Secondary | ICD-10-CM | POA: Diagnosis not present

## 2018-06-28 DIAGNOSIS — Q211 Atrial septal defect: Secondary | ICD-10-CM

## 2018-06-28 DIAGNOSIS — Z952 Presence of prosthetic heart valve: Secondary | ICD-10-CM

## 2018-06-28 DIAGNOSIS — Q2112 Patent foramen ovale: Secondary | ICD-10-CM

## 2018-06-28 LAB — POCT INR: INR: 3.5 — AB (ref 2.0–3.0)

## 2018-06-28 NOTE — Patient Instructions (Signed)
Description   Continue on same dosage 1 tablet daily except 1/2 tablet on Monday and Friday.  Recheck INR in 3 weeks.

## 2018-06-29 DIAGNOSIS — C44319 Basal cell carcinoma of skin of other parts of face: Secondary | ICD-10-CM | POA: Diagnosis not present

## 2018-06-30 ENCOUNTER — Ambulatory Visit: Payer: Medicare Other | Admitting: Cardiology

## 2018-07-11 ENCOUNTER — Encounter: Payer: Self-pay | Admitting: Cardiology

## 2018-07-11 ENCOUNTER — Ambulatory Visit (INDEPENDENT_AMBULATORY_CARE_PROVIDER_SITE_OTHER): Payer: Medicare Other | Admitting: Cardiology

## 2018-07-11 VITALS — BP 118/82 | HR 83 | Ht 70.0 in | Wt 199.8 lb

## 2018-07-11 DIAGNOSIS — I359 Nonrheumatic aortic valve disorder, unspecified: Secondary | ICD-10-CM

## 2018-07-11 DIAGNOSIS — Z7901 Long term (current) use of anticoagulants: Secondary | ICD-10-CM | POA: Diagnosis not present

## 2018-07-11 NOTE — Patient Instructions (Signed)
Medication Instructions:  Your physician recommends that you continue on your current medications as directed. Please refer to the Current Medication list given to you today.  If you need a refill on your cardiac medications before your next appointment, please call your pharmacy.   Lab work: Today: CBC  If you have labs (blood work) drawn today and your tests are completely normal, you will receive your results only by: [name] MyChart Message (if you have MyChart) OR . A paper copy in the mail If you have any lab test that is abnormal or we need to change your treatment, we will call you to review the results.  Testing/Procedures: None  Follow-Up: At [facility], you and your health needs are our priority.  As part of our continuing mission to provide you with exceptional heart care, we have created designated Provider Care Teams.  These Care Teams include your primary Cardiologist (physician) and Advanced Practice Providers (APPs -  Physician Assistants and Nurse Practitioners) who all work together to provide you with the care you need, when you need it. You will need a follow up appointment in 1 years.  Please call our office 2 months in advance to schedule this appointment.  You may see Fransico Him, MD or one of the following Advanced Practice Providers on your designated Care Team:   [place], PA-C Melina Copa, PA-C . Ermalinda Barrios, PA-C

## 2018-07-11 NOTE — Progress Notes (Signed)
Cardiology Office Note:    Date:  07/11/2018   ID:  Tracy Malta., DOB 08-Oct-1950, MRN [id number]  PCP:  Orpah Melter, MD  Cardiologist:  Fransico Him, MD    Referring MD: Orpah Melter, MD   No chief complaint on file.   History of Present Illness:    Tracy Gehring. is a 68 y.o. male with a hx of  mechanical AVR remotely in 2001 (reason unknown to patient), mild LV dysfunction EF 45-50% and a MUGA was done to verify EF which was 45% and echo from 2012 was 52%. He also has an ascending thoracic aneurysm that is being followed by Dr. Roxan Hockey.  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    He was seen by Dr. Burt Knack in consultation forconsideration of transcatheter PFO closure. TEE from 2016 demonstrated a moderate to large PFO with strongly positive bubble study demonstrating R--->L shunt.  He underwent successful transcatheter PFO closure using a 25 mm Amplatzer PFO Occluder on 11/17/17. Follow up limited echo showed no obvious PFO by color doppler. He was started on ASA 81 mg daily to continued indefinitely if tolerated. Coumadin was resumed.   He was then readmitted 6/4-11/28/17 for near syncope and melena. He was transfused and underwent upper endoscopy and underwent EPI/saline submucosally injected with good blanching. Discharge Hg 10.6.  He is here today for followup and is doing well.  He denies any chest pain or pressure, SOB, DOE, PND, orthopnea, LE edema, dizziness, palpitations or syncope. He is compliant with his meds and is tolerating meds with no SE.    Past Medical History:  Diagnosis Date  . Anemia    iron  . Arthritis   . Atypical moles    melanomna  . Basal cell carcinoma   . Bilateral carotid artery stenosis 08/03/2014   1-39% right and 40-59% left carotid artery stenosis   . Chronic anticoagulation    for mechanical AVR  . Cluster headaches   . CVA (cerebral vascular accident) ([place]) 07/24/2014    MRI indicating 2 punctate foci of acute infarction.  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   . Diverticulosis   . Dizziness   . Episodic recurrent vertigo 2002 & 2005   carotid dopplers w no clinically significant stenosis  . Fatty liver    hx of elevated hepatic transaminases-negative workup in 2009 except for U/S suggesting fatty liver  . GERD (gastroesophageal reflux disease)   . Gout   . Heart murmur   . Hyperlipidemia   . Hyperlipidemia LDL goal <70 12/05/2015  . Joint pain   . LBBB (left bundle branch block)   . Melanoma ([place])    hx of melanoma and multiple basal cell carcinomas Dr Tonia Brooms  . PFO (patent foramen ovale) 12/05/2015  . Positive ANA (antinuclear antibody)   . S/P AVR (aortic valve replacement)    mechanical  . TIA (transient ischemic attack)     Past Surgical History:  Procedure Laterality Date  . APPENDECTOMY    . CARDIAC VALVE REPLACEMENT     mechanical  . CHOLECYSTECTOMY    . DENTAL SURGERY Left bone graft and extraction  . ESOPHAGOGASTRODUODENOSCOPY (EGD) WITH PROPOFOL N/A 11/16/2017   Procedure: ESOPHAGOGASTRODUODENOSCOPY (EGD) WITH PROPOFOL;  Surgeon: Wonda Horner, MD;  Location: [facility];  Service: Endoscopy;  Laterality: N/A;  . ESOPHAGOGASTRODUODENOSCOPY (EGD) WITH PROPOFOL N/A 11/25/2017   Procedure: ESOPHAGOGASTRODUODENOSCOPY (EGD) WITH PROPOFOL;  Surgeon: Wilford Corner, MD;  Location: [place];  Service: Endoscopy;  Laterality: N/A;  . MELANOMA EXCISION     x3  . PATENT FORAMEN OVALE(PFO) CLOSURE N/A 11/17/2017   Procedure: PATENT FORAMEN OVALE (PFO) CLOSURE;  Surgeon: Sherren Mocha, MD;  Location: [place] [facility];  Service: Cardiovascular;  Laterality: N/A;  . POLYPECTOMY  11/16/2017   Procedure: POLYPECTOMY;  Surgeon: Wonda Horner, MD;   Location: [facility];  Service: Endoscopy;;  . SUBMUCOSAL INJECTION  11/25/2017   Procedure: SUBMUCOSAL INJECTION of epinephrine;  Surgeon: Wilford Corner, MD;  Location: [place];  Service: Endoscopy;;  . TEE WITHOUT CARDIOVERSION N/A 07/31/2014   Procedure: TRANSESOPHAGEAL ECHOCARDIOGRAM (TEE);  Surgeon: Sueanne Margarita, MD;  Location: [facility] ENDOSCOPY;  Service: Cardiovascular;  Laterality: N/A;  . TEE WITHOUT CARDIOVERSION N/A 10/01/2014   Procedure: TRANSESOPHAGEAL ECHOCARDIOGRAM (TEE);  Surgeon: Lelon Perla, MD;  Location: [facility] ENDOSCOPY;  Service: Cardiovascular;  Laterality: N/A;    Current Medications: Current Meds  Medication Sig  . acetaminophen (TYLENOL) 500 MG tablet Take 1,000 mg by mouth every 6 (six) hours as needed for moderate pain.  [name] aspirin 81 MG tablet Take 81 mg by mouth daily.  . colchicine 0.6 MG tablet Take 0.6 mg by mouth daily as needed (gout flares).   . diazepam (VALIUM) 2 MG tablet Take 2 mg by mouth at bedtime as needed (for sleep).   . fenofibrate (TRICOR) 145 MG tablet Take 1 tablet (145 mg total) by mouth daily.  . ferrous sulfate 325 (65 FE) MG tablet Take 325 mg by mouth 2 (two) times daily with a meal.  . fluticasone (FLONASE) 50 MCG/ACT nasal spray Place 2 sprays into both nostrils daily.  [name] gabapentin (NEURONTIN) 300 MG capsule Take 300 mg by mouth 3 (three) times daily.  . methocarbamol (ROBAXIN) 500 MG tablet methocarbamol 500 mg tablet  . Multiple Vitamins-Minerals (ONE-A-DAY MENS 50+ ADVANTAGE) TABS Take 1 tablet by mouth daily.  . pantoprazole (PROTONIX) 40 MG tablet Take 40 mg by mouth daily.  . pravastatin (PRAVACHOL) 40 MG tablet Take 1 tablet (40 mg total) by mouth daily.  . ranitidine (ZANTAC) 75 MG tablet Take 75 mg by mouth daily as needed for heartburn.   . warfarin (COUMADIN) 3 MG tablet TAKE AS DIRECTED BY COUMADIN CLINIC (Patient taking differently: Take 3 mg by mouth daily on Sunday, Tuesday, Thursday and Saturday. Take 1.5 mg by  mouth daily on Monday, Wednesday and Friday)     Allergies:   Patient has no known allergies.   Social History   Socioeconomic History  . Marital status: Married    Spouse name: Not on file  . Number of children: 0  . Years of education: Not on file  . Highest education level: Not on file  Occupational History  . 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 Financial resource strain: Not on file  . Food insecurity:    Worry: Not on file    Inability: Not on file  . Transportation needs:    Medical: Not on file    Non-medical: Not on file  Tobacco Use  . Smoking status: Never Smoker  . Smokeless tobacco: Never Used  Substance and Sexual Activity  . Alcohol use: Yes    Alcohol/week: 0.0 standard drinks    Comment: moderate - 2-3 drinks  about 3 times per week of wine, liquor, beer  . Drug use: No  . Sexual activity: Not Currently  Lifestyle  . Physical activity:    Days per week: Not on file    Minutes per session: Not on file  . Stress: Not on file  Relationships  . Social connections:    Talks on phone: Not on file    Gets together: Not on file    Attends religious service: Not on file    Active member of club or organization: Not on file    Attends meetings of clubs or organizations: Not on file    Relationship status: Not on file  Other Topics Concern  . Not on file  Social History Narrative   Lives with wife in a one story home.  No children.     Retired from [employer].     Family History: The patient's family history includes Cancer in his father and mother; Melanoma in his unknown relative; Psoriasis in his unknown relative; Scoliosis in his sister; Valvular heart disease in his brother.  ROS:   Please see the history of present illness.    ROS  All other systems reviewed and negative.   EKGs/Labs/Other Studies Reviewed:    The following studies were reviewed today: none  EKG:  EKG is not ordered today.   Recent  Labs: 11/23/2017: ALT 16 11/26/2017: BUN 8; Creatinine, Ser 1.04; Potassium 3.4; Sodium 142 11/28/2017: Hemoglobin 10.6; Platelets 253   Recent Lipid Panel    Component Value Date/Time   CHOL 149 02/26/2016 1058   TRIG 115 02/26/2016 1058   HDL 67 02/26/2016 1058   CHOLHDL 2.2 02/26/2016 1058   VLDL 23 02/26/2016 1058   LDLCALC 59 02/26/2016 1058    Physical Exam:    VS:  BP 118/82   Pulse 83   Ht 5\' 10"  (1.778 m)   Wt 199 lb 12.8 oz (90.6 kg)   SpO2 95%   BMI 28.67 kg/m     Wt Readings from Last 3 Encounters:  07/11/18 199 lb 12.8 oz (90.6 kg)  02/07/18 192 lb 2 oz (87.1 kg)  12/15/17 189 lb (85.7 kg)     GEN:  Well nourished, well developed in no acute distress HEENT: Normal NECK: No JVD; No carotid bruits LYMPHATICS: No lymphadenopathy CARDIAC: RRR, no murmurs, rubs, gallops RESPIRATORY:  Clear to auscultation without rales, wheezing or rhonchi  ABDOMEN: Soft, non-tender, non-distended MUSCULOSKELETAL:  No edema; No deformity  SKIN: Warm and dry NEUROLOGIC:  Alert and oriented x 3 PSYCHIATRIC:  Normal affect   ASSESSMENT:    No diagnosis found. PLAN:    In order of problems listed above:  1.  PFO -  now s/p PFO closure. He is following SBE prophylaxis. He has to take prophylactic Abx for his mechanical heart valve indefinitely . Repeat limited echo with agitated saline 11/2018.  2.  Mechanical AVR - he is on chronic coumadin anticoagulation and ASA 81mg  daily  3.  GI BLeed - he has not had any further melena since his endoscopy and 11/2017.  He is followed by GI.  His last hemoglobin was 10 on 12/02/2017.  I will recheck a hemoglobin today.  4.  Dilated ascending aortic aneurysm - measured 4.4cm on Chest CT angio 11/2017 - followed by CVTS surgery.  Continue statin.  LDL was at goal at 37 on 08/10/2017.   Medication Adjustments/Labs and Tests Ordered: Current medicines are reviewed at length with the patient today.  Concerns regarding medicines are outlined above.   No orders of the defined types were placed in this encounter.  No orders of the defined types were placed in this encounter.   Signed, Fransico Him, MD  07/11/2018 1:04 PM    [place]

## 2018-07-12 LAB — CBC
HEMATOCRIT: 47 % (ref 37.5–51.0)
Hemoglobin: 15.9 g/dL (ref 13.0–17.7)
MCH: 30.4 pg (ref 26.6–33.0)
MCHC: 33.8 g/dL (ref 31.5–35.7)
MCV: 90 fL (ref 79–97)
Platelets: 306 10*3/uL (ref 150–450)
RBC: 5.23 x10E6/uL (ref 4.14–5.80)
RDW: 13.3 % (ref 11.6–15.4)
WBC: 6.9 10*3/uL (ref 3.4–10.8)

## 2018-07-19 ENCOUNTER — Ambulatory Visit (INDEPENDENT_AMBULATORY_CARE_PROVIDER_SITE_OTHER): Payer: Medicare Other | Admitting: Pharmacist

## 2018-07-19 DIAGNOSIS — Z7901 Long term (current) use of anticoagulants: Secondary | ICD-10-CM

## 2018-07-19 DIAGNOSIS — Z952 Presence of prosthetic heart valve: Secondary | ICD-10-CM

## 2018-07-19 LAB — POCT INR: INR: 3.2 — AB (ref 2.0–3.0)

## 2018-07-19 NOTE — Patient Instructions (Signed)
Description   Continue taking 1 tablet daily except 1/2 tablet on Mondays and Fridays.  Recheck INR in 3 weeks.

## 2018-07-27 ENCOUNTER — Other Ambulatory Visit: Payer: Self-pay

## 2018-07-27 ENCOUNTER — Encounter (HOSPITAL_COMMUNITY)
Admission: EM | Disposition: A | Discharge status: Home / Self Care | Payer: Self-pay | Source: Home / Self Care | Attending: Cardiovascular Disease

## 2018-07-27 ENCOUNTER — Inpatient Hospital Stay (HOSPITAL_COMMUNITY): Payer: Medicare Other

## 2018-07-27 ENCOUNTER — Inpatient Hospital Stay (HOSPITAL_COMMUNITY)
Admission: EM | Admit: 2018-07-27 | Discharge: 2018-07-30 | DRG: 250 | Disposition: A | Discharge status: Home / Self Care | Payer: Medicare Other | Attending: Cardiovascular Disease | Admitting: Cardiovascular Disease

## 2018-07-27 DIAGNOSIS — I255 Ischemic cardiomyopathy: Secondary | ICD-10-CM | POA: Diagnosis present

## 2018-07-27 DIAGNOSIS — M109 Gout, unspecified: Secondary | ICD-10-CM | POA: Diagnosis present

## 2018-07-27 DIAGNOSIS — I5021 Acute systolic (congestive) heart failure: Secondary | ICD-10-CM | POA: Diagnosis not present

## 2018-07-27 DIAGNOSIS — Z9049 Acquired absence of other specified parts of digestive tract: Secondary | ICD-10-CM

## 2018-07-27 DIAGNOSIS — I340 Nonrheumatic mitral (valve) insufficiency: Secondary | ICD-10-CM

## 2018-07-27 DIAGNOSIS — R011 Cardiac murmur, unspecified: Secondary | ICD-10-CM | POA: Diagnosis not present

## 2018-07-27 DIAGNOSIS — R0789 Other chest pain: Secondary | ICD-10-CM | POA: Diagnosis not present

## 2018-07-27 DIAGNOSIS — Z79899 Other long term (current) drug therapy: Secondary | ICD-10-CM

## 2018-07-27 DIAGNOSIS — K219 Gastro-esophageal reflux disease without esophagitis: Secondary | ICD-10-CM | POA: Diagnosis not present

## 2018-07-27 DIAGNOSIS — I499 Cardiac arrhythmia, unspecified: Secondary | ICD-10-CM | POA: Diagnosis not present

## 2018-07-27 DIAGNOSIS — I447 Left bundle-branch block, unspecified: Secondary | ICD-10-CM | POA: Diagnosis not present

## 2018-07-27 DIAGNOSIS — I2102 ST elevation (STEMI) myocardial infarction involving left anterior descending coronary artery: Secondary | ICD-10-CM | POA: Diagnosis not present

## 2018-07-27 DIAGNOSIS — Z86718 Personal history of other venous thrombosis and embolism: Secondary | ICD-10-CM

## 2018-07-27 DIAGNOSIS — R0902 Hypoxemia: Secondary | ICD-10-CM | POA: Diagnosis not present

## 2018-07-27 DIAGNOSIS — Z952 Presence of prosthetic heart valve: Secondary | ICD-10-CM | POA: Diagnosis not present

## 2018-07-27 DIAGNOSIS — Z9861 Coronary angioplasty status: Secondary | ICD-10-CM

## 2018-07-27 DIAGNOSIS — Z7982 Long term (current) use of aspirin: Secondary | ICD-10-CM

## 2018-07-27 DIAGNOSIS — Z7901 Long term (current) use of anticoagulants: Secondary | ICD-10-CM | POA: Diagnosis not present

## 2018-07-27 DIAGNOSIS — I2510 Atherosclerotic heart disease of native coronary artery without angina pectoris: Secondary | ICD-10-CM | POA: Diagnosis not present

## 2018-07-27 DIAGNOSIS — K760 Fatty (change of) liver, not elsewhere classified: Secondary | ICD-10-CM | POA: Diagnosis present

## 2018-07-27 DIAGNOSIS — E785 Hyperlipidemia, unspecified: Secondary | ICD-10-CM | POA: Diagnosis present

## 2018-07-27 DIAGNOSIS — I712 Thoracic aortic aneurysm, without rupture: Secondary | ICD-10-CM | POA: Diagnosis not present

## 2018-07-27 DIAGNOSIS — I213 ST elevation (STEMI) myocardial infarction of unspecified site: Secondary | ICD-10-CM | POA: Diagnosis not present

## 2018-07-27 DIAGNOSIS — I361 Nonrheumatic tricuspid (valve) insufficiency: Secondary | ICD-10-CM

## 2018-07-27 DIAGNOSIS — Z8673 Personal history of transient ischemic attack (TIA), and cerebral infarction without residual deficits: Secondary | ICD-10-CM | POA: Diagnosis not present

## 2018-07-27 DIAGNOSIS — Z5181 Encounter for therapeutic drug level monitoring: Secondary | ICD-10-CM | POA: Diagnosis not present

## 2018-07-27 DIAGNOSIS — Z8774 Personal history of (corrected) congenital malformations of heart and circulatory system: Secondary | ICD-10-CM

## 2018-07-27 DIAGNOSIS — E782 Mixed hyperlipidemia: Secondary | ICD-10-CM | POA: Diagnosis not present

## 2018-07-27 DIAGNOSIS — Z85820 Personal history of malignant melanoma of skin: Secondary | ICD-10-CM | POA: Diagnosis not present

## 2018-07-27 DIAGNOSIS — R079 Chest pain, unspecified: Secondary | ICD-10-CM | POA: Diagnosis not present

## 2018-07-27 HISTORY — PX: CORONARY ANGIOGRAPHY: CATH118303

## 2018-07-27 HISTORY — PX: CORONARY/GRAFT ACUTE MI REVASCULARIZATION: CATH118305

## 2018-07-27 LAB — BASIC METABOLIC PANEL
Anion gap: 9 (ref 5–15)
BUN: 14 mg/dL (ref 8–23)
CO2: 19 mmol/L — ABNORMAL LOW (ref 22–32)
Calcium: 7.7 mg/dL — ABNORMAL LOW (ref 8.9–10.3)
Chloride: 112 mmol/L — ABNORMAL HIGH (ref 98–111)
Creatinine, Ser: 0.82 mg/dL (ref 0.61–1.24)
GFR calc Af Amer: >60 mL/min (ref >60)
GFR calc non Af Amer: >60 mL/min (ref >60)
Glucose, Bld: 109 mg/dL — ABNORMAL HIGH (ref 70–99)
Potassium: 2.9 mmol/L — ABNORMAL LOW (ref 3.5–5.1)
Sodium: 140 mmol/L (ref 135–145)

## 2018-07-27 LAB — POCT I-STAT 7, (LYTES, BLD GAS, ICA,H+H)
Acid-base deficit: 12 mmol/L — ABNORMAL HIGH (ref 0.0–2.0)
Bicarbonate: 16.3 mmol/L — ABNORMAL LOW (ref 20.0–28.0)
Calcium, Ion: 1.1 mmol/L — ABNORMAL LOW (ref 1.15–1.40)
HCT: 46 % (ref 39.0–52.0)
Hemoglobin: 15.6 g/dL (ref 13.0–17.0)
O2 Saturation: 90 %
Potassium: 2.9 mmol/L — ABNORMAL LOW (ref 3.5–5.1)
Sodium: 107 mmol/L — CL (ref 135–145)
TCO2: 18 mmol/L — ABNORMAL LOW (ref 22–32)
pCO2 arterial: 43.3 mmHg (ref 32.0–48.0)
pH, Arterial: 7.185 — CL (ref 7.350–7.450)
pO2, Arterial: 73 mmHg — ABNORMAL LOW (ref 83.0–108.0)

## 2018-07-27 LAB — MRSA PCR SCREENING: MRSA BY PCR: NEGATIVE

## 2018-07-27 LAB — CBC
HCT: 42.8 % (ref 39.0–52.0)
Hemoglobin: 13.9 g/dL (ref 13.0–17.0)
MCH: 29.6 pg (ref 26.0–34.0)
MCHC: 32.5 g/dL (ref 30.0–36.0)
MCV: 91.1 fL (ref 80.0–100.0)
NRBC: 0 % (ref 0.0–0.2)
Platelets: 277 10*3/uL (ref 150–400)
RBC: 4.7 MIL/uL (ref 4.22–5.81)
RDW: 12.7 % (ref 11.5–15.5)
WBC: 11 10*3/uL — ABNORMAL HIGH (ref 4.0–10.5)

## 2018-07-27 LAB — ECHOCARDIOGRAM COMPLETE
Height: 70 in
Weight: 3033.53 oz

## 2018-07-27 LAB — POCT I-STAT CREATININE: Creatinine, Ser: 0.6 mg/dL — ABNORMAL LOW (ref 0.61–1.24)

## 2018-07-27 LAB — TROPONIN I

## 2018-07-27 LAB — PROTIME-INR
INR: 3.18
Prothrombin Time: 32.1 seconds — ABNORMAL HIGH (ref 11.4–15.2)

## 2018-07-27 LAB — POCT ACTIVATED CLOTTING TIME: Activated Clotting Time: 422 seconds

## 2018-07-27 SURGERY — CORONARY ANGIOGRAPHY (CATH LAB)
Anesthesia: LOCAL

## 2018-07-27 MED ORDER — GABAPENTIN 300 MG PO CAPS
300.0000 mg | ORAL_CAPSULE | Freq: Three times a day (TID) | ORAL | Status: DC
  Administered 2018-07-27 – 2018-07-28 (×5): 300 mg via ORAL
  Filled 2018-07-27 (×6): qty 1

## 2018-07-27 MED ORDER — LIDOCAINE HCL (PF) 1 % IJ SOLN
INTRAMUSCULAR | Status: AC
  Filled 2018-07-27: qty 30

## 2018-07-27 MED ORDER — FENTANYL CITRATE (PF) 100 MCG/2ML IJ SOLN
INTRAMUSCULAR | Status: DC | PRN
  Administered 2018-07-27: 25 ug via INTRAVENOUS

## 2018-07-27 MED ORDER — LIDOCAINE HCL (PF) 1 % IJ SOLN
INTRAMUSCULAR | Status: DC | PRN
  Administered 2018-07-27: 2 mL

## 2018-07-27 MED ORDER — TICAGRELOR 90 MG PO TABS
ORAL_TABLET | ORAL | Status: AC
  Filled 2018-07-27: qty 1

## 2018-07-27 MED ORDER — MIDAZOLAM HCL 2 MG/2ML IJ SOLN
INTRAMUSCULAR | Status: AC
  Filled 2018-07-27: qty 2

## 2018-07-27 MED ORDER — TIROFIBAN (AGGRASTAT) BOLUS VIA INFUSION
INTRAVENOUS | Status: DC | PRN
  Administered 2018-07-27: 2150 ug via INTRAVENOUS

## 2018-07-27 MED ORDER — PERFLUTREN LIPID MICROSPHERE
1.0000 mL | INTRAVENOUS | Status: AC | PRN
  Administered 2018-07-27: 2 mL via INTRAVENOUS
  Filled 2018-07-27 (×2): qty 10

## 2018-07-27 MED ORDER — TIROFIBAN HCL IN NACL 5-0.9 MG/100ML-% IV SOLN
INTRAVENOUS | Status: AC
  Filled 2018-07-27: qty 100

## 2018-07-27 MED ORDER — NITROGLYCERIN 1 MG/10 ML FOR IR/CATH LAB
INTRA_ARTERIAL | Status: AC
  Filled 2018-07-27: qty 10

## 2018-07-27 MED ORDER — HEPARIN (PORCINE) IN NACL 1000-0.9 UT/500ML-% IV SOLN
INTRAVENOUS | Status: AC
  Filled 2018-07-27: qty 1000

## 2018-07-27 MED ORDER — HEPARIN SODIUM (PORCINE) 1000 UNIT/ML IJ SOLN
INTRAMUSCULAR | Status: DC | PRN
  Administered 2018-07-27: 10000 [IU] via INTRAVENOUS

## 2018-07-27 MED ORDER — SODIUM CHLORIDE 0.9% FLUSH: 3.0000 mL | INTRAVENOUS | Status: DC | PRN

## 2018-07-27 MED ORDER — TIROFIBAN HCL IN NACL 5-0.9 MG/100ML-% IV SOLN
INTRAVENOUS | Status: AC | PRN
  Administered 2018-07-27: 0.15 ug/kg/min via INTRAVENOUS

## 2018-07-27 MED ORDER — FERROUS SULFATE 325 (65 FE) MG PO TABS
325.0000 mg | ORAL_TABLET | Freq: Two times a day (BID) | ORAL | Status: DC
  Administered 2018-07-27 – 2018-07-30 (×6): 325 mg via ORAL
  Filled 2018-07-27 (×6): qty 1

## 2018-07-27 MED ORDER — SODIUM CHLORIDE 0.9% FLUSH
3.0000 mL | Freq: Two times a day (BID) | INTRAVENOUS | Status: DC
  Administered 2018-07-27 – 2018-07-30 (×5): 3 mL via INTRAVENOUS

## 2018-07-27 MED ORDER — METOPROLOL TARTRATE 12.5 MG HALF TABLET
25.0000 mg | ORAL_TABLET | Freq: Two times a day (BID) | ORAL | Status: DC
  Administered 2018-07-27 (×2): 25 mg via ORAL
  Filled 2018-07-27 (×2): qty 2

## 2018-07-27 MED ORDER — ACETAMINOPHEN 325 MG PO TABS
650.0000 mg | ORAL_TABLET | ORAL | Status: DC | PRN
  Administered 2018-07-28 (×2): 650 mg via ORAL
  Filled 2018-07-27 (×2): qty 2

## 2018-07-27 MED ORDER — PANTOPRAZOLE SODIUM 40 MG PO TBEC
40.0000 mg | DELAYED_RELEASE_TABLET | Freq: Every day | ORAL | Status: DC
  Administered 2018-07-27 – 2018-07-30 (×4): 40 mg via ORAL
  Filled 2018-07-27 (×4): qty 1

## 2018-07-27 MED ORDER — PRAVASTATIN SODIUM 40 MG PO TABS
40.0000 mg | ORAL_TABLET | Freq: Every day | ORAL | Status: DC
  Administered 2018-07-27: 40 mg via ORAL
  Filled 2018-07-27: qty 1

## 2018-07-27 MED ORDER — TICAGRELOR 90 MG PO TABS
90.0000 mg | ORAL_TABLET | Freq: Two times a day (BID) | ORAL | Status: DC
  Administered 2018-07-27: 90 mg via ORAL
  Filled 2018-07-27: qty 1

## 2018-07-27 MED ORDER — IOHEXOL 350 MG/ML SOLN
INTRAVENOUS | Status: DC | PRN
  Administered 2018-07-27: 160 mL via INTRACARDIAC

## 2018-07-27 MED ORDER — TIROFIBAN HCL IN NACL 5-0.9 MG/100ML-% IV SOLN: 0.1500 ug/kg/min | INTRAVENOUS | Status: AC

## 2018-07-27 MED ORDER — ONDANSETRON HCL 4 MG/2ML IJ SOLN: 4.0000 mg | Freq: Four times a day (QID) | INTRAMUSCULAR | Status: DC | PRN

## 2018-07-27 MED ORDER — SODIUM CHLORIDE 0.9 % IV SOLN: 250.0000 mL | INTRAVENOUS | Status: DC | PRN

## 2018-07-27 MED ORDER — VERAPAMIL HCL 2.5 MG/ML IV SOLN
INTRAVENOUS | Status: DC | PRN
  Administered 2018-07-27: 10 mL via INTRA_ARTERIAL

## 2018-07-27 MED ORDER — FLUTICASONE PROPIONATE 50 MCG/ACT NA SUSP
2.0000 | Freq: Every day | NASAL | Status: DC
  Administered 2018-07-28 – 2018-07-30 (×3): 2 via NASAL
  Filled 2018-07-27: qty 16

## 2018-07-27 MED ORDER — FENOFIBRATE 54 MG PO TABS
54.0000 mg | ORAL_TABLET | Freq: Every day | ORAL | Status: DC
  Filled 2018-07-27: qty 1

## 2018-07-27 MED ORDER — VERAPAMIL HCL 2.5 MG/ML IV SOLN
INTRAVENOUS | Status: AC
  Filled 2018-07-27: qty 2

## 2018-07-27 MED ORDER — HEPARIN SODIUM (PORCINE) 1000 UNIT/ML IJ SOLN
INTRAMUSCULAR | Status: AC
  Filled 2018-07-27: qty 1

## 2018-07-27 MED ORDER — POTASSIUM CHLORIDE CRYS ER 20 MEQ PO TBCR
40.0000 meq | EXTENDED_RELEASE_TABLET | ORAL | Status: AC
  Administered 2018-07-27 (×2): 40 meq via ORAL
  Filled 2018-07-27 (×2): qty 2

## 2018-07-27 MED ORDER — HEPARIN (PORCINE) IN NACL 1000-0.9 UT/500ML-% IV SOLN
INTRAVENOUS | Status: DC | PRN
  Administered 2018-07-27 (×2): 500 mL

## 2018-07-27 MED ORDER — HYDRALAZINE HCL 20 MG/ML IJ SOLN: 5.0000 mg | INTRAMUSCULAR | Status: AC | PRN

## 2018-07-27 MED ORDER — MIDAZOLAM HCL 2 MG/2ML IJ SOLN
INTRAMUSCULAR | Status: DC | PRN
  Administered 2018-07-27: 1 mg via INTRAVENOUS

## 2018-07-27 MED ORDER — ASPIRIN EC 81 MG PO TBEC
81.0000 mg | DELAYED_RELEASE_TABLET | Freq: Every day | ORAL | Status: DC
  Administered 2018-07-28 – 2018-07-30 (×3): 81 mg via ORAL
  Filled 2018-07-27 (×3): qty 1

## 2018-07-27 MED ORDER — SODIUM CHLORIDE 0.9 % IV SOLN: INTRAVENOUS | Status: AC

## 2018-07-27 MED ORDER — TICAGRELOR 90 MG PO TABS
ORAL_TABLET | ORAL | Status: DC | PRN
  Administered 2018-07-27: 180 mg via ORAL

## 2018-07-27 MED ORDER — LABETALOL HCL 5 MG/ML IV SOLN: 10.0000 mg | INTRAVENOUS | Status: AC | PRN

## 2018-07-27 MED ORDER — FENTANYL CITRATE (PF) 100 MCG/2ML IJ SOLN
INTRAMUSCULAR | Status: AC
  Filled 2018-07-27: qty 2

## 2018-07-27 SURGICAL SUPPLY — 18 items
BALLN EMERGE MR 2.0X12 (BALLOONS) ×2
BALLN EMERGE MR 2.5X12 (BALLOONS) ×2
BALLOON EMERGE MR 2.0X12 (BALLOONS) ×1 IMPLANT
BALLOON EMERGE MR 2.5X12 (BALLOONS) ×1 IMPLANT
CATH INFINITI 5 FR AR1 MOD (CATHETERS) ×2 IMPLANT
CATH INFINITI JR4 5F (CATHETERS) ×2 IMPLANT
CATH VISTA GUIDE 6FR XBLAD3.5 (CATHETERS) ×2 IMPLANT
DEVICE RAD COMP TR BAND LRG (VASCULAR PRODUCTS) ×2 IMPLANT
ELECT DEFIB PAD ADLT CADENCE (PAD) ×2 IMPLANT
GLIDESHEATH SLEND SS 6F .021 (SHEATH) ×2 IMPLANT
GUIDEWIRE INQWIRE 1.5J.035X260 (WIRE) ×1 IMPLANT
INQWIRE 1.5J .035X260CM (WIRE) ×2
KIT ENCORE 26 ADVANTAGE (KITS) ×2 IMPLANT
KIT HEART LEFT (KITS) ×2 IMPLANT
PACK CARDIAC CATHETERIZATION (CUSTOM PROCEDURE TRAY) ×2 IMPLANT
TRANSDUCER W/STOPCOCK (MISCELLANEOUS) ×2 IMPLANT
TUBING CIL FLEX 10 FLL-RA (TUBING) ×2 IMPLANT
WIRE COUGAR XT STRL 190CM (WIRE) ×4 IMPLANT

## 2018-07-27 NOTE — Care Management (Signed)
#  2.  S/W  [facility]   @   [employer]  RX # [phone]   BRILINTA  90 MG BID  COVER- YES CO-PAY- $ 3,089.35 TIER- 2 DRUG PRIOR APPROVAL- N0  DEDUCTIBLE : NOT MET COINSURANCE : NOT MET  PREFERRED PHARMACY : YES [facility]  M/O

## 2018-07-27 NOTE — H&P (Signed)
Cardiology Admission History and Physical:   Patient ID: Tracy Marshall. MRN: [id number]; DOB: 1950/07/16   Admission date: 07/27/2018  Primary Care Provider: Orpah Melter, MD Primary Cardiologist: Fransico Him, MD    Chief Complaint:  Chest pain, anterior STEMI  Patient Profile:   Tracy Marshall. is a 68 y.o. male with complex medical history including anemia, PFO closure, mechanical AVR on coumadin, thoracic aortic aneurysm, carotid artery disease, GERD, HLD, LBBB, prior CVA x 2, DVT presenting with c/o left chest and left arm pain. Pain started 2 hours prior to arrival by EMS. EKG with LBBB with clear ST elevations anterior leads, different than prior EKG.   History of Present Illness:   Tracy Marshall 68 y.o. male with complex medical history including anemia, PFO closure, mechanical AVR on coumadin, thoracic aortic aneurysm, carotid artery disease, GERD, HLD, LBBB, prior CVA x 2, DVT presenting with c/o left chest and left arm pain. Pain started 2 hours prior to arrival by EMS. Code STEMI called for anterior ST elevation. Ongoing pain upon arrival to ED.    Past Medical History:  Diagnosis Date  . Anemia    iron  . Arthritis   . Atypical moles    melanomna  . Basal cell carcinoma   . Bilateral carotid artery stenosis 08/03/2014   1-39% right and 40-59% left carotid artery stenosis  . Chronic anticoagulation    for mechanical AVR  . Cluster headaches   . CVA (cerebral vascular accident) ([place]) 07/24/2014    MRI indicating 2 punctate foci of acute infarction.  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   . Diverticulosis   . Dizziness   . Episodic recurrent vertigo 2002 & 2005   carotid dopplers w no clinically significant stenosis  . Fatty liver    hx of elevated hepatic transaminases-negative workup in 2009 except for U/S suggesting fatty liver  . GERD (gastroesophageal reflux disease)   . Gout   . Heart  murmur   . Hyperlipidemia   . Hyperlipidemia LDL goal <70 12/05/2015  . Joint pain   . LBBB (left bundle branch block)   . Melanoma ([place])    hx of melanoma and multiple basal cell carcinomas Dr Tonia Brooms  . PFO (patent foramen ovale) 12/05/2015  . Positive ANA (antinuclear antibody)   . S/P AVR (aortic valve replacement)    mechanical  . TIA (transient ischemic attack)     Past Surgical History:  Procedure Laterality Date  . APPENDECTOMY    . CARDIAC VALVE REPLACEMENT     mechanical  . CHOLECYSTECTOMY    . DENTAL SURGERY Left bone graft and extraction  . ESOPHAGOGASTRODUODENOSCOPY (EGD) WITH PROPOFOL N/A 11/16/2017   Procedure: ESOPHAGOGASTRODUODENOSCOPY (EGD) WITH PROPOFOL;  Surgeon: Wonda Horner, MD;  [facility];  Service: Endoscopy;  Laterality: N/A;  . ESOPHAGOGASTRODUODENOSCOPY (EGD) WITH PROPOFOL N/A 11/25/2017   Procedure: ESOPHAGOGASTRODUODENOSCOPY (EGD) WITH PROPOFOL;  Surgeon: Wilford Corner, MD;  Location: [place];  Service: Endoscopy;  Laterality: N/A;  . MELANOMA EXCISION     x3  . PATENT FORAMEN OVALE(PFO) CLOSURE N/A 11/17/2017   Procedure: PATENT FORAMEN OVALE (PFO) CLOSURE;  Surgeon: Sherren Mocha, MD;  Location: [place] [facility];  Service: Cardiovascular;  Laterality: N/A;  . POLYPECTOMY  11/16/2017   Procedure: POLYPECTOMY;  Surgeon: Wonda Horner, MD;  [facility];  Service: Endoscopy;;  . SUBMUCOSAL INJECTION  11/25/2017   Procedure: SUBMUCOSAL INJECTION of epinephrine;  Surgeon:  Wilford Corner, MD;  Location: [place];  Service: Endoscopy;;  . TEE WITHOUT CARDIOVERSION N/A 07/31/2014   Procedure: TRANSESOPHAGEAL ECHOCARDIOGRAM (TEE);  Surgeon: Sueanne Margarita, MD;  Location[facility] ENDOSCOPY;  Service: Cardiovascular;  Laterality: N/A;  . TEE WITHOUT CARDIOVERSION N/A 10/01/2014   Procedure: TRANSESOPHAGEAL ECHOCARDIOGRAM (TEE);  Surgeon: Lelon Perla, MD;  Location: [facility] ENDOSCOPY;  Service: Cardiovascular;  Laterality: N/A;      Medications Prior to Admission: Prior to Admission medications   Medication Sig Start Date End Date Taking? Authorizing Provider  acetaminophen (TYLENOL) 500 MG tablet Take 1,000 mg by mouth every 6 (six) hours as needed for moderate pain.    [provider]  aspirin 81 MG tablet Take 81 mg by mouth daily.    [provider]  colchicine 0.6 MG tablet Take 0.6 mg by mouth daily as needed (gout flares).     [provider]  diazepam (VALIUM) 2 MG tablet Take 2 mg by mouth at bedtime as needed (for sleep).  11/03/15   [provider]  fenofibrate (TRICOR) 145 MG tablet Take 1 tablet (145 mg total) by mouth daily. 01/06/16   Sueanne Margarita, MD  ferrous sulfate 325 (65 FE) MG tablet Take 325 mg by mouth 2 (two) times daily with a meal.    [provider]  fluticasone (FLONASE) 50 MCG/ACT nasal spray Place 2 sprays into both nostrils daily.    [provider]  gabapentin (NEURONTIN) 300 MG capsule Take 300 mg by mouth 3 (three) times daily. 11/30/17 11/30/18  [provider]  methocarbamol (ROBAXIN) 500 MG tablet methocarbamol 500 mg tablet 11/17/15   [provider]  Multiple Vitamins-Minerals (ONE-A-DAY MENS 50+ ADVANTAGE) TABS Take 1 tablet by mouth daily.    [provider]  pantoprazole (PROTONIX) 40 MG tablet Take 40 mg by mouth daily. 01/26/11   [provider]  pravastatin (PRAVACHOL) 40 MG tablet Take 1 tablet (40 mg total) by mouth daily. 01/21/15   Narda Amber K, DO  ranitidine (ZANTAC) 75 MG tablet Take 75 mg by mouth daily as needed for heartburn.     [provider]  warfarin (COUMADIN) 3 MG tablet TAKE AS DIRECTED BY COUMADIN CLINIC Patient taking differently: Take 3 mg by mouth daily on Sunday, Tuesday, Thursday and Saturday. Take 1.5 mg by mouth daily on Monday, Wednesday and Friday 06/02/17   Sueanne Margarita, MD     Allergies:   No Known Allergies  Social History:   Social History    Socioeconomic History  . Marital status: Married    Spouse name: Not on file  . Number of children: 0  . Years of education: Not on file  . Highest education level: Not on file  Occupational History  . 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 Financial resource strain: Not on file  . Food insecurity:    Worry: Not on file    Inability: Not on file  . Transportation needs:    Medical: Not on file    Non-medical: Not on file  Tobacco Use  . Smoking status: Never Smoker  . Smokeless tobacco: Never Used  Substance and Sexual Activity  . Alcohol use: Yes    Alcohol/week: 0.0 standard drinks    Comment: moderate - 2-3 drinks about 3 times per week of wine, liquor, beer  . Drug use: No  . Sexual activity: Not Currently  Lifestyle  . Physical activity:  Days per week: Not on file    Minutes per session: Not on file  . Stress: Not on file  Relationships  . Social connections:    Talks on phone: Not on file    Gets together: Not on file    Attends religious service: Not on file    Active member of club or organization: Not on file    Attends meetings of clubs or organizations: Not on file    Relationship status: Not on file  . Intimate partner violence:    Fear of current or ex partner: Not on file    Emotionally abused: Not on file    Physically abused: Not on file    Forced sexual activity: Not on file  Other Topics Concern  . Not on file  Social History Narrative   Lives with wife in a one story home.  No children.     Retired from [employer].    Family History:   The patient's family history includes Cancer in his father and mother; Melanoma in his unknown relative; Psoriasis in his unknown relative; Scoliosis in his sister; Valvular heart disease in his brother.    ROS:  Please see the history of present illness.  All other ROS reviewed and negative.     Physical Exam/Data:   Vitals:   07/27/18 1048 07/27/18 1053 07/27/18  1058 07/27/18 1059  BP: (!) 141/93 (!) 142/94 (!) 145/93 (!) 135/94  Pulse: 70 69 71 71  Resp: 13 11 11  (!) 9  SpO2: 97% 99% 100% 100%   No intake or output data in the 24 hours ending 07/27/18 1129 Last 3 Weights 07/11/2018 02/07/2018 12/15/2017  Weight (lbs) 199 lb 12.8 oz 192 lb 2 oz 189 lb  Weight (kg) 90.629 kg 87.147 kg 85.73 kg     There is no height or weight on file to calculate BMI.  General:  Well nourished, well developed, in no acute distress HEENT: normal Lymph: no adenopathy Neck: no JVD Endocrine:  No thryomegaly Vascular: No carotid bruits; FA pulses 2+ bilaterally without bruits  Cardiac:  normal S1, S2; RRR; mechanical valve click.  Lungs:  clear to auscultation bilaterally, no wheezing, rhonchi or rales  Abd: soft, nontender, no hepatomegaly  Ext: no LE edema Musculoskeletal:  No deformities, BUE and BLE strength normal and equal Skin: warm and dry  Neuro:  CNs 2-12 intact, no focal abnormalities noted Psych:  Normal affect    EKG:  The ECG that was done  was personally reviewed and demonstrates sinus with LBBB, anterior ST elevation  Laboratory Data:  ChemistryNo results for input(s): NA, K, CL, CO2, GLUCOSE, BUN, CREATININE, CALCIUM, GFRNONAA, GFRAA, ANIONGAP in the last 168 hours.  No results for input(s): PROT, ALBUMIN, AST, ALT, ALKPHOS, BILITOT in the last 168 hours. HematologyNo results for input(s): WBC, RBC, HGB, HCT, MCV, MCH, MCHC, RDW, PLT in the last 168 hours. Cardiac EnzymesNo results for input(s): TROPONINI in the last 168 hours. No results for input(s): TROPIPOC in the last 168 hours.  BNPNo results for input(s): BNP, PROBNP in the last 168 hours.  DDimer No results for input(s): DDIMER in the last 168 hours.  Radiology/Studies:  No results found.  Assessment and Plan:   1. Acute anterior STEMI: Plan emergent cardiac cath. Pt on coumadin with INR over 3.0. Likely PCI. Further plans to follow after cath.   Severity of Illness: The  appropriate patient status for this patient is INPATIENT. Inpatient status is judged to be  reasonable and necessary in order to provide the required intensity of service to ensure the patient's safety. The patient's presenting symptoms, physical exam findings, and initial radiographic and laboratory data in the context of their chronic comorbidities is felt to place them at high risk for further clinical deterioration. Furthermore, it is not anticipated that the patient will be medically stable for discharge from the hospital within 2 midnights of admission. The following factors support the patient status of inpatient.   " The patient's presenting symptoms include chest pain. " The worrisome physical exam findings include EKG with anterior ST elevation " The initial radiographic and laboratory data are worrisome because of  " The chronic co-morbidities include see above (CVA, aortic aneurysm, HLD)   * I certify that at the point of admission it is my clinical judgment that the patient will require inpatient hospital care spanning beyond 2 midnights from the point of admission due to high intensity of service, high risk for further deterioration and high frequency of surveillance required.*    For questions or updates, please contact [place] Please consult [email or web address] for contact info under        Signed, Lauree Chandler, MD  07/27/2018 11:29 AM

## 2018-07-27 NOTE — Care Management (Signed)
[employer] benefits check sent and pending.  Liba Hulsey RN, BSN, NCM-BC, ACM-RN [phone] 

## 2018-07-27 NOTE — Progress Notes (Signed)
  Echocardiogram 2D Echocardiogram has been performed.  Tracy Marshall 07/27/2018, 3:47 PM

## 2018-07-27 NOTE — Plan of Care (Signed)
  Problem: Activity: Goal: Ability to return to baseline activity level will improve Outcome: Progressing   Problem: Cardiovascular: Goal: Ability to achieve and maintain adequate cardiovascular perfusion will improve Outcome: Progressing   Problem: Cardiovascular: Goal: Vascular access site(s) Level 0-1 will be maintained Outcome: Progressing   Problem: Clinical Measurements: Goal: Ability to maintain clinical measurements within normal limits will improve Outcome: Progressing   Problem: Clinical Measurements: Goal: Respiratory complications will improve Outcome: Progressing   Problem: Clinical Measurements: Goal: Cardiovascular complication will be avoided Outcome: Progressing   Problem: Nutrition: Goal: Adequate nutrition will be maintained Outcome: Progressing   Problem: Coping: Goal: Level of anxiety will decrease Outcome: Progressing   Problem: Elimination: Goal: Will not experience complications related to urinary retention Outcome: Progressing   Problem: Pain Managment: Goal: General experience of comfort will improve Outcome: Progressing   Problem: Safety: Goal: Ability to remain free from injury will improve Outcome: Progressing   Problem: Skin Integrity: Goal: Risk for impaired skin integrity will decrease Outcome: Progressing

## 2018-07-27 NOTE — Progress Notes (Signed)
   07/27/18 1000  Clinical Encounter Type  Visited With Family;Patient not available  Visit Type Initial;Code (STEMI)  Referral From Chaplain  Spiritual Encounters  Spiritual Needs Emotional  Stress Factors  Patient Stress Factors Not reviewed  Family Stress Factors Loss of control   Took over from Chaplain Ray to walk pt's spouse up to [place] waiting, let Cath Lab know she was there, and let spouse know that someone from med team will be out to talk w/ her at some pt.    Myra Gianotti resident, [phone]

## 2018-07-28 ENCOUNTER — Encounter (HOSPITAL_COMMUNITY): Payer: Self-pay | Admitting: Cardiovascular Disease

## 2018-07-28 DIAGNOSIS — Z7901 Long term (current) use of anticoagulants: Secondary | ICD-10-CM

## 2018-07-28 DIAGNOSIS — E785 Hyperlipidemia, unspecified: Secondary | ICD-10-CM

## 2018-07-28 DIAGNOSIS — I255 Ischemic cardiomyopathy: Secondary | ICD-10-CM

## 2018-07-28 DIAGNOSIS — Z952 Presence of prosthetic heart valve: Secondary | ICD-10-CM

## 2018-07-28 LAB — BASIC METABOLIC PANEL
Anion gap: 13 (ref 5–15)
BUN: 13 mg/dL (ref 8–23)
CO2: 20 mmol/L — ABNORMAL LOW (ref 22–32)
Calcium: 9.5 mg/dL (ref 8.9–10.3)
Chloride: 107 mmol/L (ref 98–111)
Creatinine, Ser: 1.12 mg/dL (ref 0.61–1.24)
GFR calc Af Amer: >60 mL/min (ref >60)
GFR calc non Af Amer: >60 mL/min (ref >60)
Glucose, Bld: 114 mg/dL — ABNORMAL HIGH (ref 70–99)
Potassium: 4.1 mmol/L (ref 3.5–5.1)
Sodium: 140 mmol/L (ref 135–145)

## 2018-07-28 LAB — PROTIME-INR
INR: 2.37
Prothrombin Time: 25.6 seconds — ABNORMAL HIGH (ref 11.4–15.2)

## 2018-07-28 LAB — CBC
HCT: 48.5 % (ref 39.0–52.0)
Hemoglobin: 16.1 g/dL (ref 13.0–17.0)
MCH: 29.8 pg (ref 26.0–34.0)
MCHC: 33.2 g/dL (ref 30.0–36.0)
MCV: 89.6 fL (ref 80.0–100.0)
Platelets: 320 10*3/uL (ref 150–400)
RBC: 5.41 MIL/uL (ref 4.22–5.81)
RDW: 13.1 % (ref 11.5–15.5)
WBC: 11.1 10*3/uL — ABNORMAL HIGH (ref 4.0–10.5)
nRBC: 0 % (ref 0.0–0.2)

## 2018-07-28 LAB — TROPONIN I

## 2018-07-28 MED ORDER — ATORVASTATIN CALCIUM 80 MG PO TABS
80.0000 mg | ORAL_TABLET | Freq: Every day | ORAL | Status: DC
  Administered 2018-07-28 – 2018-07-29 (×2): 80 mg via ORAL
  Filled 2018-07-28 (×2): qty 1

## 2018-07-28 MED ORDER — CARVEDILOL 3.125 MG PO TABS
3.1250 mg | ORAL_TABLET | Freq: Two times a day (BID) | ORAL | Status: DC
  Administered 2018-07-28 – 2018-07-30 (×5): 3.125 mg via ORAL
  Filled 2018-07-28 (×5): qty 1

## 2018-07-28 MED ORDER — CLOPIDOGREL BISULFATE 300 MG PO TABS
300.0000 mg | ORAL_TABLET | Freq: Once | ORAL | Status: AC
  Administered 2018-07-28: 300 mg via ORAL
  Filled 2018-07-28: qty 1

## 2018-07-28 MED ORDER — WARFARIN SODIUM 3 MG PO TABS
6.0000 mg | ORAL_TABLET | Freq: Once | ORAL | Status: AC
  Administered 2018-07-28: 6 mg via ORAL
  Filled 2018-07-28: qty 2

## 2018-07-28 MED ORDER — CLOPIDOGREL BISULFATE 75 MG PO TABS
75.0000 mg | ORAL_TABLET | Freq: Every day | ORAL | Status: DC
  Administered 2018-07-29 – 2018-07-30 (×2): 75 mg via ORAL
  Filled 2018-07-28 (×2): qty 1

## 2018-07-28 MED ORDER — WARFARIN - PHARMACIST DOSING INPATIENT
Freq: Every day | Status: DC
  Administered 2018-07-28: 17:00:00

## 2018-07-28 NOTE — Progress Notes (Signed)
ANTICOAGULATION CONSULT NOTE - Initial Consult  Pharmacy Consult for Warfarin Indication: Mechanical AVR + CVA   No Known Allergies  Patient Measurements: Height: 5\' 10"  (177.8 cm) Weight: 189 lb 9.5 oz (86 kg) IBW/kg (Calculated) : 73  Vital Signs: Temp: 98.5 F (36.9 C) (02/06 0400) Temp Source: Oral (02/06 0400) BP: 106/77 (02/06 1000)  Labs: Recent Labs    07/27/18 1030  07/27/18 1047 07/27/18 1730 07/27/18 1827 07/28/18 0044 07/28/18 0532 07/28/18 0919  HGB  --    < > 15.6 13.9  --   --  16.1  --   HCT  --   --  46.0 42.8  --   --  48.5  --   PLT  --   --   --  277  --   --  320  --   LABPROT  --   --   --  32.1*  --   --   --  25.6*  INR  --   --   --  3.18  --   --   --  2.37  CREATININE 0.60*  --   --  0.82  --   --  1.12  --   TROPONINI  --   --   --  >65.00* >65.00* >65.00*  --   --    < > = values in this interval not displayed.    Estimated Creatinine Clearance: 67 mL/min (by C-G formula based on SCr of 1.12 mg/dL).   Medical History: Past Medical History:  Diagnosis Date  . Anemia    iron  . Arthritis   . Atypical moles    melanomna  . Basal cell carcinoma   . Bilateral carotid artery stenosis 08/03/2014   1-39% right and 40-59% left carotid artery stenosis  . Chronic anticoagulation    for mechanical AVR  . Cluster headaches   . CVA (cerebral vascular accident) ([place]) 07/24/2014    MRI indicating 2 punctate foci of acute infarction.  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   . Diverticulosis   . Dizziness   . Episodic recurrent vertigo 2002 & 2005   carotid dopplers w no clinically significant stenosis  . Fatty liver    hx of elevated hepatic transaminases-negative workup in 2009 except for U/S suggesting fatty liver  . GERD (gastroesophageal reflux disease)   . Gout   . Heart murmur   . Hyperlipidemia   . Hyperlipidemia LDL goal <70 12/05/2015  . Joint pain   . LBBB (left  bundle branch block)   . Melanoma ([place])    hx of melanoma and multiple basal cell carcinomas Dr Tonia Brooms  . PFO (patent foramen ovale) 12/05/2015  . Positive ANA (antinuclear antibody)   . S/P AVR (aortic valve replacement)    mechanical  . TIA (transient ischemic attack)      Assessment: 67yom with Hx mechanical AVR and CVA x2 presenting with STEMI.  INR on admit 3.2 - warfarin held last pm post cath and will resume today.  INR fell overnight 2.3 < goal.  Will boost today but may need heparin bridge if remains < goal tomorrow.  CBC stable, no bleeding Received ticagrelor load 2/5 and tirofiban and clopidogrel load today with plan to switch for decrease risk of bleeding in the long run.    Goal of Therapy:  INR 3-3.5  Monitor platelets by anticoagulation protocol: Yes   Plan:  Warfarin 6mg  x1  Daily INR,  CBC If INR not increasing tomorrow - may need to bridge with heparin drip   Bonnita Nasuti Pharm.D. CPP, BCPS Clinical Pharmacist [phone] 07/28/2018 12:02 PM  '

## 2018-07-28 NOTE — Progress Notes (Signed)
Progress Note  Patient Name: Tracy Marshall. Date of Encounter: 07/28/2018  Primary Cardiologist: Radford Pax  Subjective   No recurent chest pain; had mild SOB last evening  Inpatient Medications    Scheduled Meds: . aspirin EC  81 mg Oral Daily  . fenofibrate  54 mg Oral Daily  . ferrous sulfate  325 mg Oral BID WC  . fluticasone  2 spray Each Nare Daily  . gabapentin  300 mg Oral TID  . metoprolol tartrate  25 mg Oral BID  . pantoprazole  40 mg Oral Daily  . pravastatin  40 mg Oral q1800  . sodium chloride flush  3 mL Intravenous Q12H  . ticagrelor  90 mg Oral BID   Continuous Infusions: . sodium chloride     PRN Meds: sodium chloride, acetaminophen, ondansetron (ZOFRAN) IV, sodium chloride flush   Vital Signs    Vitals:   07/28/18 0300 07/28/18 0400 07/28/18 0500 07/28/18 0600  BP: 106/74 106/70 106/74 103/76  Pulse:      Resp: 13 12 11 15   Temp:  98.5 F (36.9 C)    TempSrc:  Oral    SpO2: 99% 98% 98% 100%  Weight:      Height:        Intake/Output Summary (Last 24 hours) at 07/28/2018 0751 Last data filed at 07/27/2018 2300 Gross per 24 hour  Intake 757.5 ml  Output 1150 ml  Net -392.5 ml    I/O since admission: -392  Filed Weights   07/27/18 1130 07/27/18 1200  Weight: 86 kg 86 kg    Telemetry    Sinus in the 70s - Personally Reviewed  ECG    07/28/2018 ECG (independently read by me): NSR 66 LBBB more pronounced T wave inversion V2-6, I,aVL  07/27/2018 ECG (independently read by me): NSR 65; LBBB  Physical Exam   BP 103/76   Pulse (!) 59   Temp 98.5 F (36.9 C) (Oral)   Resp 15   Ht 5\' 10"  (1.778 m)   Wt 86 kg   SpO2 100%   BMI 27.20 kg/m  General: Alert, oriented, no distress.  Skin: normal turgor, no rashes, warm and dry HEENT: Normocephalic, atraumatic. Pupils equal round and reactive to light; sclera anicteric; extraocular muscles intact;  Nose without nasal septal hypertrophy Mouth/Parynx benign; Mallinpatti scale 3 Neck: No  JVD, no carotid bruits; normal carotid upstroke Lungs: clear to ausculatation and percussion; no wheezing or rales Chest wall: without tenderness to palpitation Heart: PMI not displaced, RRR, s1 s2 normal, 2/6 systolic murmur, crisp click with mechano diastolic murmur, no rubs, gallops, thrills, or heaves Abdomen: soft, nontender; no hepatosplenomehaly, BS+; abdominal aorta nontender and not dilated by palpation. Back: no CVA tenderness Pulses 2+ Musculoskeletal: full range of motion, normal strength, no joint deformities Extremities: no clubbing cyanosis or edema, Homan's sign negative  Neurologic: grossly nonfocal; Cranial nerves grossly wnl Psychologic: Normal mood and affect   Labs    Chemistry Recent Labs  Lab 07/27/18 1030 07/27/18 1047 07/27/18 1730 07/28/18 0532  NA  --  107* 140 140  K  --  2.9* 2.9* 4.1  CL  --   --  112* 107  CO2  --   --  19* 20*  GLUCOSE  --   --  109* 114*  BUN  --   --  14 13  CREATININE 0.60*  --  0.82 1.12  CALCIUM  --   --  7.7* 9.5  GFRNONAA  --   --  >  60 >60  GFRAA  --   --  >60 >60  ANIONGAP  --   --  9 13     Hematology Recent Labs  Lab 07/27/18 1047 07/27/18 1730 07/28/18 0532  WBC  --  11.0* 11.1*  RBC  --  4.70 5.41  HGB 15.6 13.9 16.1  HCT 46.0 42.8 48.5  MCV  --  91.1 89.6  MCH  --  29.6 29.8  MCHC  --  32.5 33.2  RDW  --  12.7 13.1  PLT  --  277 320    Cardiac Enzymes Recent Labs  Lab 07/27/18 1730 07/27/18 1827 07/28/18 0044  TROPONINI >65.00* >65.00* >65.00*   No results for input(s): TROPIPOC in the last 168 hours.   BNPNo results for input(s): BNP, PROBNP in the last 168 hours.   DDimer No results for input(s): DDIMER in the last 168 hours.   Lipid Panel     Component Value Date/Time   CHOL 149 02/26/2016 1058   TRIG 115 02/26/2016 1058   HDL 67 02/26/2016 1058   CHOLHDL 2.2 02/26/2016 1058   VLDL 23 02/26/2016 1058   LDLCALC 59 02/26/2016 1058     Radiology    No results found.  Cardiac  Studies    Prox LAD lesion is 99% stenosed.  Ost 1st Diag lesion is 99% stenosed.  Ost Cx lesion is 20% stenosed.  Balloon angioplasty was performed using a BALLOON EMERGE MR 2.5X12.  Post intervention, there is a 0% residual stenosis.  Balloon angioplasty was performed using a BALLOON EMERGE MR 2.0X12.  Post intervention, there is a 0% residual stenosis.   1. Acute anterior STEMI secondary to thrombotic occlusion of the mid LAD and Diagonal branch. I treated both branches with balloon angioplasty with an excellent angiographic result. No stents placed. As the thrombosis was suspected to be a primary thrombotic vs embolic event rather than true plaque rupture, I elected to treat him with medical therapy alone following balloon angioplasty.  2. Mild plaque ostial Circumflex 3. No disease noted in the RCA 4. Mechanical aortic valve so this was not crossed for pressures  Recommendations: Admit to ICU. Will continue Aggrastat for 2 hours. He was loaded with Brilinta in the cath lab. I will give an additional dose of Brilinta tonight. Will review with IC team. May be best to consider ASA/Plavix and coumadin at time of discharge. Will start a beta blocker. Continue statin. Echo tomorrow to assess LV systolic function.        07/27/2018 ECHO IMPRESSIONS   1. There is akinesis of the entire apical left ventricular segment.  2. The left ventricle has severely reduced systolic function of 28-00%. The cavity size is normal. There is moderately increased left ventricular wall thickness. Echo evidence of impaired diastolic relaxation.  3. The right ventricle has normal systolic function. The cavity in normal in size. There is no increase in right ventricular wall thickness.  4. There is moderate dilatation of the ascending aorta.  5. Definity used; akinesis of the distal septum and apex with overall severely reduced LV function; swirling noted at LV apex but no obvious thrombus; mild diastolic  dysfunction; moderate LVH; s/p AVR with mean gradient 13 mmHg and trace AI; moderately  dilated ascending aorta (46 mm); mild MR.  FINDINGS  Left Ventricle: The left ventricle has severely reduced systolic function of 34-91%. The cavity size is normal. There is moderately increased left ventricular wall thickness. Echo evidence of impaired diastolic relaxation There  is akinesis of the entire  apical left ventricular segment. Definity contrast agent was given IV to delineate the left ventricular endocardial borders. Right Ventricle: The right ventricle has normal systolic function. The cavity in normal in size. There is no increase in right ventricular wall thickness. Left Atrium: is normal in size Right Atrium: is normal in size Interatrial Septum: No atrial level shunt detected by color flow Doppler.  Pericardium: There is no evidence of pericardial effusion. Mitral Valve: The mitral valve is normal in structure There is mild thickening. There is mild mitral annular calcification present. Mitral valve regurgitation is mild by color flow Doppler. Tricuspid Valve: The tricuspid valve is normal in structure. Tricuspid valve regurgitation is mild by color flow Doppler. Aortic Valve: The aortic valve has been repaired/replaced. Pulmonic Valve: The pulmonic valve is normal in structure. Pulmonic valve regurgitation is trivial by color flow Doppler. Aorta: There is moderate dilatation of the ascending aorta measuring 46 mm. Venous: The inferior vena cava was normal in size with greater than 50% respiratory variability.  Patient Profile     Tracy Marshall. is a 68 y.o. male with complex medical history including anemia, PFO closure, mechanical AVR on coumadin, thoracic aortic aneurysm, carotid artery disease, GERD, HLD, LBBB, prior CVA x 2, DVT presenting on 07/27/2018  with c/o left chest and left arm pain. Pain started 2 hours prior to arrival by EMS. EKG with LBBB with clear ST elevations  anterior leads, different than prior EKG.   Assessment & Plan    1. Day 1 s/p Anterior STEMI rx with PTCA of LAD/DX without stenting. Felt to be trombotic vs embolic occlusion rather than plaque rupture.  2. Acute ischemic cardiomyopathy;  New EF 25% on echo with LAD wall motion abl.  (EF 50-55% in 2016)  Will initiate carvediolol today at 3.125 and dc metoprolol;  As BP allows add ACE-I/ARB probably tomorrow and possibly aldostertone blockade.  Hopefully LV fxn will recover.  3. Mechanical AVR.  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  No thrombus noted on valve on echo.  ? If etiology for potential embolic MI;  INR 3.0 on presentation  4. Hyperlipidemia: has been on long term pravastatin and fenofibrate. ? Mixed hypelipidemia.   Will check lipid panel. Change to atorvastatin 80 mg; dc fenofibrate and if TG elevated >135 add vascepa with Reduce-it trial data.  5. Warfarin anticoagulation.  Will dc brilinta; load with plavix 300 mg and start 75 mg tomorrow; INR with warfarin 3.0 - 3.5  Especially with prior stroke history.  6. Moderately dilated Ao root  Signed, Troy Sine, MD, [facility] 07/28/2018, 7:51 AM

## 2018-07-28 NOTE — Progress Notes (Signed)
CARDIAC REHAB PHASE I   PRE:  Rate/Rhythm: 71 SR    BP: sitting 96/73    SaO2: 98 RA  MODE:  Ambulation: 370 ft   POST:  Rate/Rhythm: 83 SR    BP: sitting 104/76     SaO2: 100 RA  Pt sts he has felt SOB lying flat. Wears O2 to help. Able to ambulate without major c/o, steady pace. VSS. To recliner and discussed ed with pt and wife. Discussed MI, Plavix, PTCA, restrictions, diet, low sodium, daily wts for signs of HF (gave booklet). Also discussed CRPII and will refer to [place]. Pt and wife made me aware that they have a 17 day trip scheduled 2/23 to [place] (small river cruise, 3 leg flight). Encouraged to discuss with MD. They unfortunately do not have travel insurance. Will f/u tomorrow. [place], ACSM 07/28/2018 2:33 PM

## 2018-07-28 NOTE — Progress Notes (Signed)
Pt arrived to 4e from [facility] 2h. Pt and pt's wife oriented to room and staff. Vitals obtained. Telemetry box applied and CCMD notified x2. Pt denies needs at this time. Will continue current plan of care.   Ara Kussmaul BSN, RN

## 2018-07-29 DIAGNOSIS — E782 Mixed hyperlipidemia: Secondary | ICD-10-CM

## 2018-07-29 DIAGNOSIS — Z5181 Encounter for therapeutic drug level monitoring: Secondary | ICD-10-CM

## 2018-07-29 LAB — BASIC METABOLIC PANEL
Anion gap: 9 (ref 5–15)
BUN: 16 mg/dL (ref 8–23)
CO2: 23 mmol/L (ref 22–32)
Calcium: 9.5 mg/dL (ref 8.9–10.3)
Chloride: 106 mmol/L (ref 98–111)
Creatinine, Ser: 1.17 mg/dL (ref 0.61–1.24)
GFR calc Af Amer: >60 mL/min (ref >60)
GFR calc non Af Amer: >60 mL/min (ref >60)
Glucose, Bld: 111 mg/dL — ABNORMAL HIGH (ref 70–99)
Potassium: 3.7 mmol/L (ref 3.5–5.1)
Sodium: 138 mmol/L (ref 135–145)

## 2018-07-29 LAB — CBC
HCT: 46.5 % (ref 39.0–52.0)
HEMOGLOBIN: 15.9 g/dL (ref 13.0–17.0)
MCH: 30.6 pg (ref 26.0–34.0)
MCHC: 34.2 g/dL (ref 30.0–36.0)
MCV: 89.6 fL (ref 80.0–100.0)
Platelets: 302 10*3/uL (ref 150–400)
RBC: 5.19 MIL/uL (ref 4.22–5.81)
RDW: 13.2 % (ref 11.5–15.5)
WBC: 9.3 10*3/uL (ref 4.0–10.5)
nRBC: 0 % (ref 0.0–0.2)

## 2018-07-29 LAB — LIPID PANEL
CHOL/HDL RATIO: 3.3 ratio
Cholesterol: 136 mg/dL (ref 0–200)
HDL: 41 mg/dL (ref >40)
LDL Cholesterol: 65 mg/dL (ref 0–99)
Triglycerides: 150 mg/dL — ABNORMAL HIGH (ref <150)
VLDL: 30 mg/dL (ref 0–40)

## 2018-07-29 LAB — PROTIME-INR
INR: 2.47
PROTHROMBIN TIME: 26.4 s — AB (ref 11.4–15.2)

## 2018-07-29 LAB — MAGNESIUM: MAGNESIUM: 1.9 mg/dL (ref 1.7–2.4)

## 2018-07-29 MED ORDER — GABAPENTIN 300 MG PO CAPS
900.0000 mg | ORAL_CAPSULE | Freq: Three times a day (TID) | ORAL | Status: DC
  Administered 2018-07-29 – 2018-07-30 (×4): 900 mg via ORAL
  Filled 2018-07-29 (×4): qty 3

## 2018-07-29 MED ORDER — WARFARIN SODIUM 3 MG PO TABS
6.0000 mg | ORAL_TABLET | Freq: Once | ORAL | Status: AC
  Administered 2018-07-29: 6 mg via ORAL
  Filled 2018-07-29: qty 2

## 2018-07-29 NOTE — Care Management Important Message (Signed)
Important Message  Patient Details  Name: Tracy Marshall. MRN: [id number] Date of Birth: 20-Jul-1950   Medicare Important Message Given:  Yes    Delorse Lek 07/29/2018, 3:55 PM

## 2018-07-29 NOTE — Progress Notes (Addendum)
Progress Note  Patient Name: Tracy Marshall. Date of Encounter: 07/29/2018  Primary Cardiologist: Fransico Him, MD   Subjective   Feeling well today. No chest pain. Ambulated without difficulty.   Inpatient Medications    Scheduled Meds: . aspirin EC  81 mg Oral Daily  . atorvastatin  80 mg Oral q1800  . carvedilol  3.125 mg Oral BID WC  . clopidogrel  75 mg Oral Daily  . ferrous sulfate  325 mg Oral BID WC  . fluticasone  2 spray Each Nare Daily  . gabapentin  900 mg Oral TID  . pantoprazole  40 mg Oral Daily  . sodium chloride flush  3 mL Intravenous Q12H  . Warfarin - Pharmacist Dosing Inpatient   Does not apply q1800   Continuous Infusions: . sodium chloride     PRN Meds: sodium chloride, acetaminophen, ondansetron (ZOFRAN) IV, sodium chloride flush   Vital Signs    Vitals:   07/28/18 1953 07/28/18 2327 07/29/18 0336 07/29/18 0806  BP: 100/72 98/69 103/79 104/68  Pulse: 74 73  77  Resp: 20 20 20 17   Temp: 98.3 F (36.8 C) 97.7 F (36.5 C) 98.3 F (36.8 C) 98.9 F (37.2 C)  TempSrc: Oral Oral Oral Oral  SpO2: 98% 96% 96% 95%  Weight:      Height:        Intake/Output Summary (Last 24 hours) at 07/29/2018 0919 Last data filed at 07/29/2018 0857 Gross per 24 hour  Intake 843 ml  Output 475 ml  Net 368 ml   Last 3 Weights 07/27/2018 07/27/2018 07/11/2018  Weight (lbs) 189 lb 9.5 oz 189 lb 9.5 oz 199 lb 12.8 oz  Weight (kg) 86 kg 86 kg 90.629 kg      Telemetry    SR - Personally Reviewed  ECG    SR with LBBB- Personally Reviewed  Physical Exam   GEN: No acute distress.   Neck: No JVD Cardiac: RRR, no murmurs, rubs, or gallops.  Respiratory: Clear to auscultation bilaterally. GI: Soft, nontender, non-distended  MS: No edema; No deformity. Radial cath site stable.  Neuro:  Nonfocal  Psych: Normal affect   Labs    Chemistry Recent Labs  Lab 07/27/18 1730 07/28/18 0532 07/29/18 0322  NA 140 140 138  K 2.9* 4.1 3.7  CL 112* 107 106  CO2  19* 20* 23  GLUCOSE 109* 114* 111*  BUN 14 13 16   CREATININE 0.82 1.12 1.17  CALCIUM 7.7* 9.5 9.5  GFRNONAA >60 >60 >60  GFRAA >60 >60 >60  ANIONGAP 9 13 9      Hematology Recent Labs  Lab 07/27/18 1730 07/28/18 0532 07/29/18 0322  WBC 11.0* 11.1* 9.3  RBC 4.70 5.41 5.19  HGB 13.9 16.1 15.9  HCT 42.8 48.5 46.5  MCV 91.1 89.6 89.6  MCH 29.6 29.8 30.6  MCHC 32.5 33.2 34.2  RDW 12.7 13.1 13.2  PLT 277 320 302    Cardiac Enzymes Recent Labs  Lab 07/27/18 1730 07/27/18 1827 07/28/18 0044  TROPONINI >65.00* >65.00* >65.00*   No results for input(s): TROPIPOC in the last 168 hours.   BNPNo results for input(s): BNP, PROBNP in the last 168 hours.   DDimer No results for input(s): DDIMER in the last 168 hours.    Lipid Panel     Component Value Date/Time   CHOL 136 07/29/2018 0322   TRIG 150 (H) 07/29/2018 0322   HDL 41 07/29/2018 0322   CHOLHDL 3.3 07/29/2018 0322  VLDL 30 07/29/2018 0322   LDLCALC 65 07/29/2018 0322   Radiology    No results found.  Cardiac Studies    Prox LAD lesion is 99% stenosed.  Ost 1st Diag lesion is 99% stenosed.  Ost Cx lesion is 20% stenosed.  Balloon angioplasty was performed using a BALLOON EMERGE MR 2.5X12.  Post intervention, there is a 0% residual stenosis.  Balloon angioplasty was performed using a BALLOON EMERGE MR 2.0X12.  Post intervention, there is a 0% residual stenosis.  1. Acute anterior STEMI secondary to thrombotic occlusion of the mid LAD and Diagonal branch. I treated both branches with balloon angioplasty with an excellent angiographic result. No stents placed. As the thrombosis was suspected to be a primary thrombotic vs embolic event rather than true plaque rupture, I elected to treat him with medical therapy alone following balloon angioplasty.  2. Mild plaque ostial Circumflex 3. No disease noted in the RCA 4. Mechanical aortic valve so this was not crossed for pressures  Recommendations: Admit to  ICU. Will continue Aggrastat for 2 hours. He was loaded with Brilinta in the cath lab. I will give an additional dose of Brilinta tonight. Will review with IC team. May be best to consider ASA/Plavix and coumadin at time of discharge. Will start a beta blocker. Continue statin. Echo tomorrow to assess LV systolic function.        07/27/2018 ECHO IMPRESSIONS  1. There is akinesis of the entire apical left ventricular segment. 2. The left ventricle has severely reduced systolic function of 67-12%. The cavity size is normal. There is moderately increased left ventricular wall thickness. Echo evidence of impaired diastolic relaxation. 3. The right ventricle has normal systolic function. The cavity in normal in size. There is no increase in right ventricular wall thickness. 4. There is moderate dilatation of the ascending aorta. 5. Definity used; akinesis of the distal septum and apex with overall severely reduced LV function; swirling noted at LV apex but no obvious thrombus; mild diastolic dysfunction; moderate LVH; s/p AVR with mean gradient 13 mmHg and trace AI; moderately  dilated ascending aorta (46 mm); mild MR.  FINDINGS Left Ventricle: The left ventricle has severely reduced systolic function of 45-80%. The cavity size is normal. There is moderately increased left ventricular wall thickness. Echo evidence of impaired diastolic relaxation There is akinesis of the entire apical left ventricular segment. Definity contrast agent was given IV to delineate the left ventricular endocardial borders. Right Ventricle: The right ventricle has normal systolic function. The cavity in normal in size. There is no increase in right ventricular wall thickness. Left Atrium: is normal in size Right Atrium: is normal in size Interatrial Septum: No atrial level shunt detected by color flow Doppler.  Pericardium: There is no evidence of pericardial effusion. Mitral Valve: The mitral valve is  normal in structure There is mild thickening. There is mild mitral annular calcification present. Mitral valve regurgitation is mild by color flow Doppler. Tricuspid Valve: The tricuspid valve is normal in structure. Tricuspid valve regurgitation is mild by color flow Doppler. Aortic Valve: The aortic valve has been repaired/replaced. Pulmonic Valve: The pulmonic valve is normal in structure. Pulmonic valve regurgitation is trivial by color flow Doppler. Aorta: There is moderate dilatation of the ascending aorta measuring 46 mm. Venous: The inferior vena cava was normal in size with greater than 50% respiratory variability.  Patient Profile     69 y.o. male with complex medical history including anemia, PFO closure, mechanical AVR on coumadin,  thoracic aortic aneurysm, carotid artery disease, GERD, HLD, LBBB, prior CVA x 2, DVT presenting on 07/27/2018  with c/o left chest and left arm pain. Pain started 2 hours prior to arrival by EMS. EKG with LBBB with clear ST elevations anterior leads, different than prior EKG.  Assessment & Plan    1. Anterior STEMI rx with PTCA of LAD/DX without stenting: Felt to be trombotic vs embolic occlusion rather than plaque rupture. Initially loaded with Brilinta, but switched to plavix with load given the need for [facility] with his AVR. -- tolerating BB addition. Continue ASA, statin  2. Acute ischemic cardiomyopathy:  New EF 25% on echo with LAD wall motion abl.  (EF 50-55% in 2016). No signs of volume overload.  -- started on coreg yesterday, tolerating but BP is soft. Consider adding ARB as blood pressure tolerates. -- consider repeat echo prior to discharge. No thrombus reported, but noted swirling in the LV apex.   3. 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  No thrombus noted on valve on echo. Possible embolic though INR 3.1 on presentation.  -- INR 2.4 today. Coumadin has  been restarted.   4. Hyperlipidemia: has been on long term pravastatin and fenofibrate prior to admission.  -- switched to high dose atorvastatin and fenofibrate dc'ed.  5. Warfarin anticoagulation:  Now on triple therapy with ASA, plavix and coumadin.  -- INR 2.4 today. PharmD to dose.   6. Moderately dilated Ao root  For questions or updates, please contact [place] Please consult [email or web address] for contact info under      Signed, Reino Bellis, NP  07/29/2018, 9:19 AM     Patient seen and examined. Agree with assessment and plan. No recurrent chest pain.  He has ambulated and felt well without recurrent symptoms.  Concern is that patient developed thrombotic versus embolic occlusion while INR therapeutic at 3.1.  Remotely, he had had history of prior small stroke on warfarin and his INR goal has been increased to 3-3.5.  Carvedilol has been added to his medical regimen EF on echo at 25%.  Blood pressure remains soft.  Probably tomorrow further titrate carvedilol to 6.25 mg twice a day and consider adding low-dose ARB therapy.  I have discontinued his pravastatin and fenofibrate and have started him on atorvastatin 80 mg.  Triglycerides 150.  At discharge would add Vascepa 2 capsules p.o. twice daily per "Reduce-it" trial data with additional cardiovascular outcome benefit since triglycerides greater than 135 as is in the study.  Patient stated today that he has a tentative overseas trip planned with departure date August 14, 2026 to go on a 17-day trip to [place] in [place] with a cruise on the [place].  With his departure date only 18 days following his following his acute coronary syndrome I have suggested that defer going on this long distance trip.  We will reinitiate warfarin.  Recommend triple drug therapy for 1 month and then aspirin can be discontinued with plans to continue Plavix/warfarin.  Consider follow-up hematologic assessment of potential coagulation  abnormalities.   Troy Sine, MD, [facility] 07/29/2018 4:28 PM

## 2018-07-29 NOTE — Progress Notes (Signed)
CARDIAC REHAB PHASE I   PRE:  Rate/Rhythm: 67 SR    BP: sitting 112/75    SaO2:   MODE:  Ambulation: 860 ft   POST:  Rate/Rhythm: 85 SR    BP: sitting 119/80     SaO2:   Tolerated well, no c/o. Ed finished with pt and wife (wife asks that she be there for all information due to pts memory issues with CVA). Good reception. Encouraged more walking today. [id number]   [place], ACSM 07/29/2018 11:06 AM

## 2018-07-29 NOTE — Progress Notes (Signed)
ANTICOAGULATION CONSULT NOTE  Pharmacy Consult for Warfarin Indication: Mechanical AVR + CVA   No Known Allergies  Patient Measurements: Height: 5\' 10"  (177.8 cm) Weight: 189 lb 9.5 oz (86 kg) IBW/kg (Calculated) : 73  Vital Signs: Temp: 98.9 F (37.2 C) (02/07 0806) Temp Source: Oral (02/07 0806) BP: 104/68 (02/07 0806) Pulse Rate: 77 (02/07 0806)  Labs: Recent Labs    07/27/18 1730 07/27/18 1827 07/28/18 0044 07/28/18 0532 07/28/18 0919 07/29/18 0322  HGB 13.9  --   --  16.1  --  15.9  HCT 42.8  --   --  48.5  --  46.5  PLT 277  --   --  320  --  302  LABPROT 32.1*  --   --   --  25.6* 26.4*  INR 3.18  --   --   --  2.37 2.47  CREATININE 0.82  --   --  1.12  --  1.17  TROPONINI >65.00* >65.00* >65.00*  --   --   --     Estimated Creatinine Clearance: 64.1 mL/min (by C-G formula based on SCr of 1.17 mg/dL).   Medical History: Past Medical History:  Diagnosis Date  . Anemia    iron  . Arthritis   . Atypical moles    melanomna  . Basal cell carcinoma   . Bilateral carotid artery stenosis 08/03/2014   1-39% right and 40-59% left carotid artery stenosis  . Chronic anticoagulation    for mechanical AVR  . Cluster headaches   . CVA (cerebral vascular accident) ([place]) 07/24/2014    MRI indicating 2 punctate foci of acute infarction.  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   . Diverticulosis   . Dizziness   . Episodic recurrent vertigo 2002 & 2005   carotid dopplers w no clinically significant stenosis  . Fatty liver    hx of elevated hepatic transaminases-negative workup in 2009 except for U/S suggesting fatty liver  . GERD (gastroesophageal reflux disease)   . Gout   . Heart murmur   . Hyperlipidemia   . Hyperlipidemia LDL goal <70 12/05/2015  . Joint pain   . LBBB (left bundle branch block)   . Melanoma ([place])    hx of melanoma and multiple basal cell carcinomas Dr Tonia Brooms  . PFO (patent  foramen ovale) 12/05/2015  . Positive ANA (antinuclear antibody)   . S/P AVR (aortic valve replacement)    mechanical  . TIA (transient ischemic attack)      Assessment: 67yom with Hx mechanical AVR and CVA x2 presenting with STEMI.  INR on admit 3.2 - warfarin held last pm post cath and resumed 2/6.    INR trending up to 2.4 this morning, will boost today but may need heparin bridge if remains < goal tomorrow.  CBC stable, no bleeding Received ticagrelor load 2/5 and tirofiban and clopidogrel load 2/6 with plan to switch for decrease risk of bleeding in the long run.    Note high INR goal, may need to be adjusted as outpatient given addition of Asprin and plavix.   Goal of Therapy:  INR 3-3.5  Monitor platelets by anticoagulation protocol: Yes   Plan:  Warfarin 6mg  again tonight Daily INR, CBC  Erin Hearing PharmD., BCPS Clinical Pharmacist 07/29/2018 10:32 AM

## 2018-07-29 NOTE — Plan of Care (Signed)
  Problem: Education: Goal: Understanding of CV disease, CV risk reduction, and recovery process will improve Outcome: Progressing Goal: Individualized Educational Video(s) Outcome: Progressing   Problem: Activity: Goal: Ability to return to baseline activity level will improve Outcome: Progressing   Problem: Cardiovascular: Goal: Ability to achieve and maintain adequate cardiovascular perfusion will improve Outcome: Progressing Goal: Vascular access site(s) Level 0-1 will be maintained Outcome: Progressing   Problem: Health Behavior/Discharge Planning: Goal: Ability to safely manage health-related needs after discharge will improve Outcome: Progressing   Problem: Health Behavior/Discharge Planning: Goal: Ability to manage health-related needs will improve Outcome: Progressing   Problem: Clinical Measurements: Goal: Ability to maintain clinical measurements within normal limits will improve Outcome: Progressing Goal: Will remain free from infection Outcome: Progressing Goal: Diagnostic test results will improve Outcome: Progressing Goal: Respiratory complications will improve Outcome: Progressing Goal: Cardiovascular complication will be avoided Outcome: Progressing

## 2018-07-30 LAB — PROTIME-INR
INR: 3.33
Prothrombin Time: 33.3 seconds — ABNORMAL HIGH (ref 11.4–15.2)

## 2018-07-30 LAB — BASIC METABOLIC PANEL
Anion gap: 13 (ref 5–15)
BUN: 15 mg/dL (ref 8–23)
CO2: 25 mmol/L (ref 22–32)
Calcium: 9.9 mg/dL (ref 8.9–10.3)
Chloride: 101 mmol/L (ref 98–111)
Creatinine, Ser: 1.27 mg/dL — ABNORMAL HIGH (ref 0.61–1.24)
GFR calc Af Amer: >60 mL/min (ref >60)
GFR, EST NON AFRICAN AMERICAN: 58 mL/min — AB (ref >60)
GLUCOSE: 112 mg/dL — AB (ref 70–99)
Potassium: 3.9 mmol/L (ref 3.5–5.1)
Sodium: 139 mmol/L (ref 135–145)

## 2018-07-30 LAB — CBC
HCT: 46.3 % (ref 39.0–52.0)
HEMOGLOBIN: 15.7 g/dL (ref 13.0–17.0)
MCH: 30.4 pg (ref 26.0–34.0)
MCHC: 33.9 g/dL (ref 30.0–36.0)
MCV: 89.6 fL (ref 80.0–100.0)
Platelets: 301 10*3/uL (ref 150–400)
RBC: 5.17 MIL/uL (ref 4.22–5.81)
RDW: 13 % (ref 11.5–15.5)
WBC: 8.7 10*3/uL (ref 4.0–10.5)
nRBC: 0 % (ref 0.0–0.2)

## 2018-07-30 MED ORDER — WARFARIN SODIUM 1 MG PO TABS
1.5000 mg | ORAL_TABLET | Freq: Once | ORAL | Status: DC
  Filled 2018-07-30: qty 1

## 2018-07-30 MED ORDER — CARVEDILOL 3.125 MG PO TABS: 3.1250 mg | ORAL_TABLET | Freq: Two times a day (BID) | ORAL | 6 refills | Status: DC

## 2018-07-30 MED ORDER — LISINOPRIL 2.5 MG PO TABS: 2.5000 mg | ORAL_TABLET | Freq: Every day | ORAL | Status: DC

## 2018-07-30 MED ORDER — LISINOPRIL 2.5 MG PO TABS: 2.5000 mg | ORAL_TABLET | Freq: Every day | ORAL | 6 refills | Status: DC

## 2018-07-30 MED ORDER — ATORVASTATIN CALCIUM 80 MG PO TABS: 80.0000 mg | ORAL_TABLET | Freq: Every day | ORAL | 6 refills | Status: DC

## 2018-07-30 MED ORDER — CLOPIDOGREL BISULFATE 75 MG PO TABS: 75.0000 mg | ORAL_TABLET | Freq: Every day | ORAL | 11 refills | Status: DC

## 2018-07-30 NOTE — Progress Notes (Signed)
Discharged to home with family office visits in place teaching done  

## 2018-07-30 NOTE — Progress Notes (Signed)
Progress Note  Patient Name: Tracy Marshall. Date of Encounter: 07/30/2018  Primary Cardiologist: Fransico Him, MD   Subjective   No complaints, has ambulated halls multiple times without issues.   Inpatient Medications    Scheduled Meds: . aspirin EC  81 mg Oral Daily  . atorvastatin  80 mg Oral q1800  . carvedilol  3.125 mg Oral BID WC  . clopidogrel  75 mg Oral Daily  . ferrous sulfate  325 mg Oral BID WC  . fluticasone  2 spray Each Nare Daily  . gabapentin  900 mg Oral TID  . pantoprazole  40 mg Oral Daily  . sodium chloride flush  3 mL Intravenous Q12H  . warfarin  1.5 mg Oral ONCE-1800  . Warfarin - Pharmacist Dosing Inpatient   Does not apply q1800   Continuous Infusions: . sodium chloride     PRN Meds: sodium chloride, acetaminophen, ondansetron (ZOFRAN) IV, sodium chloride flush   Vital Signs    Vitals:   07/29/18 1934 07/29/18 2351 07/30/18 0314 07/30/18 0759  BP: 103/86 112/72 100/74 100/71  Pulse: (!) 59 60 73 72  Resp: 15 16 16 18   Temp: 98.5 F (36.9 C) 98.6 F (37 C) 99.1 F (37.3 C) 98.6 F (37 C)  TempSrc: Oral Oral Oral Oral  SpO2: 98% 96% 98% 93%  Weight:      Height:        Intake/Output Summary (Last 24 hours) at 07/30/2018 1249 Last data filed at 07/30/2018 0800 Gross per 24 hour  Intake 360 ml  Output 900 ml  Net -540 ml   Last 3 Weights 07/27/2018 07/27/2018 07/11/2018  Weight (lbs) 189 lb 9.5 oz 189 lb 9.5 oz 199 lb 12.8 oz  Weight (kg) 86 kg 86 kg 90.629 kg      Telemetry    SR LBBB - Personally Reviewed  ECG    na  Physical Exam   GEN: No acute distress.   Neck: No JVD Cardiac: INO,6/7 systolic murmur rusbs, rubs, or gallops.  Respiratory: Clear to auscultation bilaterally. GI: Soft, nontender, non-distended  MS: No edema; No deformity. Neuro:  Nonfocal  Psych: Normal affect   Labs    Chemistry Recent Labs  Lab 07/28/18 0532 07/29/18 0322 07/30/18 0356  NA 140 138 139  K 4.1 3.7 3.9  CL 107 106 101    CO2 20* 23 25  GLUCOSE 114* 111* 112*  BUN 13 16 15   CREATININE 1.12 1.17 1.27*  CALCIUM 9.5 9.5 9.9  GFRNONAA >60 >60 58*  GFRAA >60 >60 >60  ANIONGAP 13 9 13      Hematology Recent Labs  Lab 07/28/18 0532 07/29/18 0322 07/30/18 0356  WBC 11.1* 9.3 8.7  RBC 5.41 5.19 5.17  HGB 16.1 15.9 15.7  HCT 48.5 46.5 46.3  MCV 89.6 89.6 89.6  MCH 29.8 30.6 30.4  MCHC 33.2 34.2 33.9  RDW 13.1 13.2 13.0  PLT 320 302 301    Cardiac Enzymes Recent Labs  Lab 07/27/18 1730 07/27/18 1827 07/28/18 0044  TROPONINI >65.00* >65.00* >65.00*   No results for input(s): TROPIPOC in the last 168 hours.   BNPNo results for input(s): BNP, PROBNP in the last 168 hours.   DDimer No results for input(s): DDIMER in the last 168 hours.   Radiology    No results found.  Cardiac Studies     Patient Profile     68 y.o. male with complex medical history including anemia, PFO closure, mechanical AVR  on coumadin, thoracic aortic aneurysm, carotid artery disease, GERD, HLD, LBBB, prior CVA x 2, DVT presentingon 2/5/2020with c/o left chest and left arm pain. Pain started 2 hours prior to arrival by EMS. EKG with LBBB with clear ST elevations anterior leads, different than prior EKG.  Assessment & Plan    1. Anterior STEMI - treated with PTCA of LAD/DX without stenting, felt to be thrombotic vs embolic as opposed to plaque rupture.  - medical therapy with ASA, plavix, coumadin. Would d/c aspirin at 1 month and continue just coumadin and plavix. Also on coreg 3.125mg  bid, atorva 80. Soft bp's have not added ACEI - echo LVEF 25-30%akinesis of the distal septum and apex (echo 2015 45-50% LVEF, 50-55% by TEE in 2016) - start lisinopril 2.5mg  daily.   2. Acute systolic HF - echo LVEF 03-83%FXOVANVB of the distal septum and apex (echo 2015 45-50% LVEF, 50-55% by TEE in 2016) - soft bp's limit medical therapy, would not tolerate entresto. STart lisinopril 2.5mg  daily.    3. History of AVR - on  coumadin at home. Goal has been 3-3.5 according to prior notes due to prior CVA on coumadin.  - INR 3.3 today  4. History of CVA - from notes prior CVA on coumadin, goal INR increased to 3 to 3.5  5. Hyperlipdemia - starated on atorvastatin this admission - consider vascepa as outpatient, worry about pill fatigue with multiple new meds though there is clinical benefit   Ok for discharge today    For questions or updates, please contact [facility] Please consult [email or web address] for contact info under        Signed, Carlyle Dolly, MD  07/30/2018, 12:49 PM

## 2018-07-30 NOTE — Progress Notes (Signed)
ANTICOAGULATION CONSULT NOTE  Pharmacy Consult for Warfarin Indication: Mechanical AVR + CVA   No Known Allergies  Patient Measurements: Height: 5\' 10"  (177.8 cm) Weight: 189 lb 9.5 oz (86 kg) IBW/kg (Calculated) : 73  Vital Signs: Temp: 98.6 F (37 C) (02/08 0759) Temp Source: Oral (02/08 0759) BP: 100/71 (02/08 0759) Pulse Rate: 72 (02/08 0759)  Labs: Recent Labs    07/27/18 1730 07/27/18 1827 07/28/18 0044 07/28/18 0532 07/28/18 0919 07/29/18 0322 07/30/18 0356  HGB 13.9  --   --  16.1  --  15.9 15.7  HCT 42.8  --   --  48.5  --  46.5 46.3  PLT 277  --   --  320  --  302 301  LABPROT 32.1*  --   --   --  25.6* 26.4* 33.3*  INR 3.18  --   --   --  2.37 2.47 3.33  CREATININE 0.82  --   --  1.12  --  1.17 1.27*  TROPONINI >65.00* >65.00* >65.00*  --   --   --   --     Estimated Creatinine Clearance: 59.1 mL/min (A) (by C-G formula based on SCr of 1.27 mg/dL (H)).   Medical History: Past Medical History:  Diagnosis Date  . Anemia    iron  . Arthritis   . Atypical moles    melanomna  . Basal cell carcinoma   . Bilateral carotid artery stenosis 08/03/2014   1-39% right and 40-59% left carotid artery stenosis  . Chronic anticoagulation    for mechanical AVR  . Cluster headaches   . CVA (cerebral vascular accident) ([place]) 07/24/2014    MRI indicating 2 punctate foci of acute infarction.  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   . Diverticulosis   . Dizziness   . Episodic recurrent vertigo 2002 & 2005   carotid dopplers w no clinically significant stenosis  . Fatty liver    hx of elevated hepatic transaminases-negative workup in 2009 except for U/S suggesting fatty liver  . GERD (gastroesophageal reflux disease)   . Gout   . Heart murmur   . Hyperlipidemia   . Hyperlipidemia LDL goal <70 12/05/2015  . Joint pain   . LBBB (left bundle branch block)   . Melanoma ([place])    hx of melanoma and  multiple basal cell carcinomas Dr Tonia Brooms  . PFO (patent foramen ovale) 12/05/2015  . Positive ANA (antinuclear antibody)   . S/P AVR (aortic valve replacement)    mechanical  . TIA (transient ischemic attack)      Assessment: 67yom with Hx mechanical AVR and CVA x2 presenting with STEMI.  INR on admit 3.2 - warfarin held last pm post cath and resumed 2/6.   -INR= 3.33 (trend up)   PTA warfarin 1.5mg  MF/3mg  AOD   Goal of Therapy:  INR 3-3.5  Monitor platelets by anticoagulation protocol: Yes   Plan:  -Warfarin 1.5mg  po today then would consider resuming home regimen -Daily INR -Note high INR goal, may need to be adjusted as outpatient given addition of Asprin and plavix  Hildred Laser, PharmD Clinical Pharmacist **Pharmacist phone directory can now be found on [email or web address] ([email or web address]).  Listed under [facility].

## 2018-07-30 NOTE — Discharge Summary (Signed)
Discharge Summary    Patient ID: Tracy Marshall. MRN: [id number]; DOB: 22-Mar-1951  Admit date: 07/27/2018 Discharge date: 07/30/2018  Primary Care Provider: Orpah Melter, MD  Primary Cardiologist: Fransico Him, MD   Discharge Diagnoses    Active Problems:   Acute ST elevation myocardial infarction (STEMI) involving left anterior descending (LAD) coronary artery [facility])   Allergies No Known Allergies  Diagnostic Studies/Procedures    Echo 07/27/18  1. There is akinesis of the entire apical left ventricular segment.  2. The left ventricle has severely reduced systolic function of 91-50%. The cavity size is normal. There is moderately increased left ventricular wall thickness. Echo evidence of impaired diastolic relaxation.  3. The right ventricle has normal systolic function. The cavity in normal in size. There is no increase in right ventricular wall thickness.  4. There is moderate dilatation of the ascending aorta.  5. Definity used; akinesis of the distal septum and apex with overall severely reduced LV function; swirling noted at LV apex but no obvious thrombus; mild diastolic dysfunction; moderate LVH; s/p AVR with mean gradient 13  CORONARY ANGIOGRAPHY (CATH LAB)  CORONARY/GRAFT ACUTE MI REVASCULARIZATION  Conclusion     Prox LAD lesion is 99% stenosed.  Ost 1st Diag lesion is 99% stenosed.  Ost Cx lesion is 20% stenosed.  Balloon angioplasty was performed using a BALLOON EMERGE MR 2.5X12.  Post intervention, there is a 0% residual stenosis.  Balloon angioplasty was performed using a BALLOON EMERGE MR 2.0X12.  Post intervention, there is a 0% residual stenosis.   1. Acute anterior STEMI secondary to thrombotic occlusion of the mid LAD and Diagonal branch. I treated both branches with balloon angioplasty with an excellent angiographic result. No stents placed. As the thrombosis was suspected to be a primary thrombotic vs embolic event rather than true plaque  rupture, I elected to treat him with medical therapy alone following balloon angioplasty.  2. Mild plaque ostial Circumflex 3. No disease noted in the RCA 4. Mechanical aortic valve so this was not crossed for pressures  Recommendations: Admit to ICU. Will continue Aggrastat for 2 hours. He was loaded with Brilinta in the cath lab. I will give an additional dose of Brilinta tonight. Will review with IC team. May be best to consider ASA/Plavix and coumadin at time of discharge. Will start a beta blocker. Continue statin. Echo tomorrow to assess LV systolic function.       History of Present Illness     68 y.o.malewith complex medical history including anemia, PFO closure, mechanical AVR on coumadin, thoracic aortic aneurysm, carotid artery disease, GERD, HLD, LBBB, prior CVA x 2, DVT presentiedon 2/5/2020with c/o left chest and left arm pain. Pain started 2 hours prior to arrival by EMS. EKG with LBBB with clear ST elevations anterior leads, different than prior EKG.  Hospital Course     Consultants: None  1. Acute anterior STEMI: secondary to thrombotic occlusion of the mid LAD and Diagonal branch. Teated both branches with balloon angioplasty with an excellent angiographic result. No stenting. Felt to be thrombotic vs embolic as opposed to plaque rupture. Troponin peaked >65. Medical therapy with ASA, plavix, coumadin. Would d/c aspirin at 1 month and continue just coumadin and plavix. No recurrent symptoms. Lipitor increased to 80mg  qd.   2. Acute systolic CHF/ICM - Echo showed LVEF 25-30% with akinesis of the distal septum and apex (echo 2015 45-50% LVEF, 50-55% by TEE in 2016).  - Soft bp's limit medical therapy, would not tolerate  entresto. Started lisinopril 2.5mg  daily. Continue BB.  Consider spironolactone as outpatient.   3. History of AVR - on coumadin at home. Goal has been 3-3.5 according to prior notes due to prior CVA on coumadin.  INR 3.3 at date of discharge.   4.  HLD - 07/29/2018: Cholesterol 136; HDL 41; LDL Cholesterol 65; Triglycerides 150; VLDL 30  - Lipitor increased to 80mg  daily.  -  Lipid panel and LFTS in 6-8 weeks   The patient been seen by Dr. Harl Bowie today and deemed ready for discharge home. All follow-up appointments have been scheduled. Discharge medications are listed below.   Discharge Vitals Blood pressure 101/74, pulse 96, temperature 97.9 F (36.6 C), temperature source Oral, resp. rate 18, height 5\' 10"  (1.778 m), weight 86 kg, SpO2 95 %.  Filed Weights   07/27/18 1130 07/27/18 1200  Weight: 86 kg 86 kg    Labs & Radiologic Studies    CBC Recent Labs    07/29/18 0322 07/30/18 0356  WBC 9.3 8.7  HGB 15.9 15.7  HCT 46.5 46.3  MCV 89.6 89.6  PLT 302 361   Basic Metabolic Panel Recent Labs    07/29/18 0322 07/30/18 0356  NA 138 139  K 3.7 3.9  CL 106 101  CO2 23 25  GLUCOSE 111* 112*  BUN 16 15  CREATININE 1.17 1.27*  CALCIUM 9.5 9.9  MG 1.9  --    Cardiac Enzymes Recent Labs    07/27/18 1730 07/27/18 1827 07/28/18 0044  TROPONINI >65.00* >65.00* >65.00*   BFasting Lipid Panel Recent Labs    07/29/18 0322  CHOL 136  HDL 41  LDLCALC 65  TRIG 150*  CHOLHDL 3.3   Thyroid Function Tests No results for input(s): TSH, T4TOTAL, T3FREE, THYROIDAB in the last 72 hours.  Invalid input(s): FREET3 _____________  No results found. Disposition   Pt is being discharged home today in good condition.  Follow-up Plans & Appointments    Follow-up Information    Sueanne Margarita, MD Follow up.   Specialty:  Cardiology Why:  office will call with time and date  Contact information: [address] [place] [address] [phone]        [place] [facility] Follow up.   Specialty:  Cardiology Why:  coumadin check early next week Contact information: [address] [place] [phone]         Discharge Instructions    Amb  Referral to Cardiac Rehabilitation   Complete by:  As directed    Diagnosis:   STEMI PTCA     Diet - low sodium heart healthy   Complete by:  As directed    Discharge instructions   Complete by:  As directed    No driving for 2 weeks. No lifting over 10 lbs for 4 weeks. No sexual activity for 4 weeks. You may not return to work until cleared by your cardiologist.  Keep procedure site clean & dry. If you notice increased pain, swelling, bleeding or pus, call/return!  You may shower, but no soaking baths/hot tubs/pools for 1 week.   Take 1.5mg  of coumadin tonight and then resume home dose starting tomorrow. INR check next week   Increase activity slowly   Complete by:  As directed       Discharge Medications   Allergies as of 07/30/2018   No Known Allergies     Medication List    STOP taking  these medications   pravastatin 40 MG tablet Commonly known as:  PRAVACHOL     TAKE these medications   acetaminophen 500 MG tablet Commonly known as:  TYLENOL Take 1,000 mg by mouth every 6 (six) hours as needed for moderate pain.   aspirin 81 MG tablet Take 81 mg by mouth at bedtime.   atorvastatin 80 MG tablet Commonly known as:  LIPITOR Take 1 tablet (80 mg total) by mouth daily at 6 PM.   carvedilol 3.125 MG tablet Commonly known as:  COREG Take 1 tablet (3.125 mg total) by mouth 2 (two) times daily with a meal.   clopidogrel 75 MG tablet Commonly known as:  PLAVIX Take 1 tablet (75 mg total) by mouth daily. Start taking on:  July 31, 2018   colchicine 0.6 MG tablet Take 0.6 mg by mouth daily as needed (gout flares).   diazepam 2 MG tablet Commonly known as:  VALIUM Take 2 mg by mouth at bedtime as needed (for sleep).   fenofibrate 145 MG tablet Commonly known as:  TRICOR Take 1 tablet (145 mg total) by mouth daily.   ferrous sulfate 325 (65 FE) MG tablet Take 325 mg by mouth 2 (two) times daily with a meal.   fluticasone 50 MCG/ACT nasal spray Commonly known  as:  FLONASE Place 2 sprays into both nostrils daily.   lisinopril 2.5 MG tablet Commonly known as:  PRINIVIL,ZESTRIL Take 1 tablet (2.5 mg total) by mouth daily. Start taking on:  July 31, 2018   methocarbamol 500 MG tablet Commonly known as:  ROBAXIN Take 500 mg by mouth every 6 (six) hours as needed for muscle spasms.   NEURONTIN 300 MG capsule Generic drug:  gabapentin Take 900 mg by mouth 3 (three) times daily.   ONE-A-DAY MENS 50+ ADVANTAGE Tabs Take 1 tablet by mouth daily.   pantoprazole 40 MG tablet Commonly known as:  PROTONIX Take 40 mg by mouth daily.   ranitidine 75 MG tablet Commonly known as:  ZANTAC Take 75 mg by mouth daily as needed for heartburn.   warfarin 3 MG tablet Commonly known as:  COUMADIN Take as directed. If you are unsure how to take this medication, talk to your nurse or doctor. Original instructions:  TAKE AS DIRECTED BY COUMADIN CLINIC What changed:  See the new instructions.        Acute coronary syndrome (MI, NSTEMI, STEMI, etc) this admission?: Yes.     AHA/ACC Clinical Performance & Quality Measures: 1. Aspirin prescribed? - Yes 2. ADP Receptor Inhibitor (Plavix/Clopidogrel, Brilinta/Ticagrelor or Effient/Prasugrel) prescribed (includes medically managed patients)? - Yes 3. Beta Blocker prescribed? - Yes 4. High Intensity Statin (Lipitor 40-80mg  or Crestor 20-40mg ) prescribed? - Yes 5. EF assessed during THIS hospitalization? - Yes 6. For EF <40%, was ACEI/ARB prescribed? - Yes 7. For EF <40%, Aldosterone Antagonist (Spironolactone or Eplerenone) prescribed? - No - Reason:  Will plan to add as outpatient 8. Cardiac Rehab Phase II ordered (Included Medically managed Patients)? - Yes     Outstanding Labs/Studies   BEMT at follow up Lipid panel and LFTS at 6 weeks.   Duration of Discharge Encounter   Greater than 30 minutes including physician time.  Jarrett Soho, PA 07/30/2018, 1:43 PM

## 2018-07-30 NOTE — H&P (View-Only) (Signed)
Progress Note  Patient Name: Tracy Marshall. Date of Encounter: 07/30/2018  Primary Cardiologist: Fransico Him, MD   Subjective   No complaints, has ambulated halls multiple times without issues.   Inpatient Medications    Scheduled Meds: . aspirin EC  81 mg Oral Daily  . atorvastatin  80 mg Oral q1800  . carvedilol  3.125 mg Oral BID WC  . clopidogrel  75 mg Oral Daily  . ferrous sulfate  325 mg Oral BID WC  . fluticasone  2 spray Each Nare Daily  . gabapentin  900 mg Oral TID  . pantoprazole  40 mg Oral Daily  . sodium chloride flush  3 mL Intravenous Q12H  . warfarin  1.5 mg Oral ONCE-1800  . Warfarin - Pharmacist Dosing Inpatient   Does not apply q1800   Continuous Infusions: . sodium chloride     PRN Meds: sodium chloride, acetaminophen, ondansetron (ZOFRAN) IV, sodium chloride flush   Vital Signs    Vitals:   07/29/18 1934 07/29/18 2351 07/30/18 0314 07/30/18 0759  BP: 103/86 112/72 100/74 100/71  Pulse: (!) 59 60 73 72  Resp: 15 16 16 18   Temp: 98.5 F (36.9 C) 98.6 F (37 C) 99.1 F (37.3 C) 98.6 F (37 C)  TempSrc: Oral Oral Oral Oral  SpO2: 98% 96% 98% 93%  Weight:      Height:        Intake/Output Summary (Last 24 hours) at 07/30/2018 1249 Last data filed at 07/30/2018 0800 Gross per 24 hour  Intake 360 ml  Output 900 ml  Net -540 ml   Last 3 Weights 07/27/2018 07/27/2018 07/11/2018  Weight (lbs) 189 lb 9.5 oz 189 lb 9.5 oz 199 lb 12.8 oz  Weight (kg) 86 kg 86 kg 90.629 kg      Telemetry    SR LBBB - Personally Reviewed  ECG    na  Physical Exam   GEN: No acute distress.   Neck: No JVD Cardiac: BWG,6/6 systolic murmur rusbs, rubs, or gallops.  Respiratory: Clear to auscultation bilaterally. GI: Soft, nontender, non-distended  MS: No edema; No deformity. Neuro:  Nonfocal  Psych: Normal affect   Labs    Chemistry Recent Labs  Lab 07/28/18 0532 07/29/18 0322 07/30/18 0356  NA 140 138 139  K 4.1 3.7 3.9  CL 107 106 101    CO2 20* 23 25  GLUCOSE 114* 111* 112*  BUN 13 16 15   CREATININE 1.12 1.17 1.27*  CALCIUM 9.5 9.5 9.9  GFRNONAA >60 >60 58*  GFRAA >60 >60 >60  ANIONGAP 13 9 13      Hematology Recent Labs  Lab 07/28/18 0532 07/29/18 0322 07/30/18 0356  WBC 11.1* 9.3 8.7  RBC 5.41 5.19 5.17  HGB 16.1 15.9 15.7  HCT 48.5 46.5 46.3  MCV 89.6 89.6 89.6  MCH 29.8 30.6 30.4  MCHC 33.2 34.2 33.9  RDW 13.1 13.2 13.0  PLT 320 302 301    Cardiac Enzymes Recent Labs  Lab 07/27/18 1730 07/27/18 1827 07/28/18 0044  TROPONINI >65.00* >65.00* >65.00*   No results for input(s): TROPIPOC in the last 168 hours.   BNPNo results for input(s): BNP, PROBNP in the last 168 hours.   DDimer No results for input(s): DDIMER in the last 168 hours.   Radiology    No results found.  Cardiac Studies     Patient Profile     68 y.o. male with complex medical history including anemia, PFO closure, mechanical AVR  on coumadin, thoracic aortic aneurysm, carotid artery disease, GERD, HLD, LBBB, prior CVA x 2, DVT presentingon 2/5/2020with c/o left chest and left arm pain. Pain started 2 hours prior to arrival by EMS. EKG with LBBB with clear ST elevations anterior leads, different than prior EKG.  Assessment & Plan    1. Anterior STEMI - treated with PTCA of LAD/DX without stenting, felt to be thrombotic vs embolic as opposed to plaque rupture.  - medical therapy with ASA, plavix, coumadin. Would d/c aspirin at 1 month and continue just coumadin and plavix. Also on coreg 3.125mg  bid, atorva 80. Soft bp's have not added ACEI - echo LVEF 25-30%akinesis of the distal septum and apex (echo 2015 45-50% LVEF, 50-55% by TEE in 2016) - start lisinopril 2.5mg  daily.   2. Acute systolic HF - echo LVEF 34-35%WYSHUOHF of the distal septum and apex (echo 2015 45-50% LVEF, 50-55% by TEE in 2016) - soft bp's limit medical therapy, would not tolerate entresto. STart lisinopril 2.5mg  daily.    3. History of AVR - on  coumadin at home. Goal has been 3-3.5 according to prior notes due to prior CVA on coumadin.  - INR 3.3 today  4. History of CVA - from notes prior CVA on coumadin, goal INR increased to 3 to 3.5  5. Hyperlipdemia - starated on atorvastatin this admission - consider vascepa as outpatient, worry about pill fatigue with multiple new meds though there is clinical benefit   Ok for discharge today    For questions or updates, please contact [facility] Please consult [email or web address] for contact info under        Signed, Carlyle Dolly, MD  07/30/2018, 12:49 PM

## 2018-08-01 ENCOUNTER — Telehealth: Payer: Self-pay | Admitting: Cardiology

## 2018-08-01 DIAGNOSIS — D6859 Other primary thrombophilia: Secondary | ICD-10-CM

## 2018-08-01 NOTE — Telephone Encounter (Signed)
New Message   Pt was seen in the ER for a heart attack last week 02/05 and would like to see Dr Radford Pax ASAP. He doesn't want to see a PA  I scheduled the next available with Dr Radford Pax for 02/20 but PT would like something sooner. Please call

## 2018-08-01 NOTE — Telephone Encounter (Addendum)
I want him to see Hematology before trip to see what they recommend since Acute MI likely embolic from hyperoagulable state.  I would also like him set up for TEE this week to make sure AVR looks ok.  He had a big MI and needs cancel trip

## 2018-08-01 NOTE — Telephone Encounter (Signed)
I will route to Dr. Theodosia Blender nurse Trey Sailors, RN.Marland KitchenMarland Kitchen

## 2018-08-01 NOTE — Telephone Encounter (Signed)
Spoke with the patient, he accepted his appointment. He wanted to know what Dr. Radford Pax thought about him going on his 15 day cruise in [place] on 08/14/18. He also wanted to know if he needed additional testing post-hospital.

## 2018-08-02 ENCOUNTER — Ambulatory Visit (INDEPENDENT_AMBULATORY_CARE_PROVIDER_SITE_OTHER): Payer: Medicare Other

## 2018-08-02 DIAGNOSIS — Q211 Atrial septal defect: Secondary | ICD-10-CM | POA: Diagnosis not present

## 2018-08-02 DIAGNOSIS — Q2112 Patent foramen ovale: Secondary | ICD-10-CM

## 2018-08-02 DIAGNOSIS — Z952 Presence of prosthetic heart valve: Secondary | ICD-10-CM

## 2018-08-02 DIAGNOSIS — Z7901 Long term (current) use of anticoagulants: Secondary | ICD-10-CM

## 2018-08-02 LAB — POCT INR: INR: 4.1 — AB (ref 2.0–3.0)

## 2018-08-02 NOTE — Telephone Encounter (Signed)
Spoke with the patient, he expressed understanding about the instructions for the TEE. They had no further questions.

## 2018-08-02 NOTE — Telephone Encounter (Signed)
Cancel trip and do both

## 2018-08-02 NOTE — Telephone Encounter (Addendum)
Should the patient go see hematology or just cancel his trip and have a TEE? Or both?

## 2018-08-02 NOTE — Addendum Note (Signed)
Addended by: Sarina Ill on: 08/02/2018 10:27 AM   Modules accepted: Orders

## 2018-08-02 NOTE — Addendum Note (Signed)
Addended by: Sarina Ill on: 08/02/2018 10:49 AM   Modules accepted: Orders

## 2018-08-02 NOTE — Patient Instructions (Signed)
Continue taking 1 tablet daily except 1/2 tablet on Mondays and Fridays.  Recheck INR in 2 weeks.

## 2018-08-02 NOTE — Telephone Encounter (Signed)
Spoke with the patient's wife advised that the patient should not go on the cruise. They accepted a TEE on 08/05/18 at 8:15 am with Dr. Margaretann Loveless. He also was referred to Dr. Irene Limbo, which he has seen in the past, for his hypercoagulability state. Printed patient instructions and will discuss with the patient, when he comes in to the [facility] today for INR check.

## 2018-08-04 ENCOUNTER — Other Ambulatory Visit: Payer: Self-pay | Admitting: Cardiology

## 2018-08-04 ENCOUNTER — Telehealth: Payer: Self-pay | Admitting: Hematology

## 2018-08-04 ENCOUNTER — Telehealth (HOSPITAL_COMMUNITY): Payer: Self-pay

## 2018-08-04 DIAGNOSIS — I639 Cerebral infarction, unspecified: Secondary | ICD-10-CM

## 2018-08-04 NOTE — Telephone Encounter (Signed)
Pt insurance is active and benefits verified through Medicare A/B. Co-pay $0.00, DED $198.00/$198.00 met, out of pocket $0.00/$0.00 met, co-insurance 20%. No pre-authorization required. Passport, 08/04/2018 @ 12:26PM, REF# [id number]  2ndary insurance is active and benefits verified through [employer] [place].[name] Co-pay $0.00, DED $0.00/$0.00 met, out of pocket $0.00/$0.00 met, co-insurance 0. No pre-authorization required.  Will contact patient to see if he is interested in the Cardiac Rehab Program. If interested, patient will need to complete follow up appt. Once completed, patient will be contacted for scheduling upon review by the RN Navigator.

## 2018-08-04 NOTE — Anesthesia Preprocedure Evaluation (Addendum)
Anesthesia Evaluation  Patient identified by MRN, date of birth, ID band Patient awake    Reviewed: Allergy & Precautions, NPO status , Patient's Chart, lab work & pertinent test results, reviewed documented beta blocker date and time   Airway Mallampati: III  TM Distance: >3 FB Neck ROM: Full    Dental no notable dental hx.    Pulmonary neg pulmonary ROS,    Pulmonary exam normal breath sounds clear to auscultation       Cardiovascular + CAD, + Past MI and +CHF  Normal cardiovascular exam+ Valvular Problems/Murmurs  Rhythm:Regular Rate:Normal  ECG: rate 66. Normal sinus rhythm Left bundle branch block  Sees cardiologist ([facility])   ECHO: 1. There is akinesis of the entire apical left ventricular segment. 2. The left ventricle has severely reduced systolic function of 41-58%. The cavity size is normal. There is moderately increased left ventricular wall thickness. Echo evidence of impaired diastolic relaxation. 3. The right ventricle has normal systolic function. The cavity in normal in size. There is no increase in right ventricular wall thickness. 4. There is moderate dilatation of the ascending aorta. 5. Definity used; akinesis of the distal septum and apex with overall severely reduced LV function; swirling noted at LV apex but no obvious thrombus; mild diastolic dysfunction; moderate LVH; s/p AVR with mean gradient 13 mmHg and trace AI; moderately  dilated ascending aorta (46 mm); mild MR.     Neuro/Psych  Headaches, TIACVA    GI/Hepatic Neg liver ROS, GERD  Medicated and Controlled,  Endo/Other  negative endocrine ROS  Renal/GU negative Renal ROS     Musculoskeletal negative musculoskeletal ROS (+)   Abdominal   Peds  Hematology HLD   Anesthesia Other Findings S/p AVR stemi  Reproductive/Obstetrics                            Anesthesia Physical Anesthesia Plan  ASA:  III  Anesthesia Plan: MAC   Post-op Pain Management:    Induction: Intravenous  PONV Risk Score and Plan: 1 and Propofol infusion and Treatment may vary due to age or medical condition  Airway Management Planned: Nasal Cannula  Additional Equipment:   Intra-op Plan:   Post-operative Plan:   Informed Consent: I have reviewed the patients History and Physical, chart, labs and discussed the procedure including the risks, benefits and alternatives for the proposed anesthesia with the patient or authorized representative who has indicated his/her understanding and acceptance.     Dental advisory given  Plan Discussed with: CRNA  Anesthesia Plan Comments:         Anesthesia Quick Evaluation

## 2018-08-04 NOTE — Telephone Encounter (Signed)
Scheduled appt per 2/13 schmessage  - unable tot reach patient - left message with appt date and time

## 2018-08-05 ENCOUNTER — Ambulatory Visit (HOSPITAL_COMMUNITY): Payer: Medicare Other | Admitting: Anesthesiology

## 2018-08-05 ENCOUNTER — Other Ambulatory Visit: Payer: Self-pay

## 2018-08-05 ENCOUNTER — Encounter (HOSPITAL_COMMUNITY)
Admission: RE | Disposition: A | Discharge status: Home / Self Care | Payer: Self-pay | Source: Home / Self Care | Attending: Internal Medicine

## 2018-08-05 ENCOUNTER — Ambulatory Visit (HOSPITAL_COMMUNITY)
Admission: RE | Admit: 2018-08-05 | Discharge: 2018-08-05 | Disposition: A | Discharge status: Home / Self Care | Payer: Medicare Other | Attending: Internal Medicine | Admitting: Internal Medicine

## 2018-08-05 ENCOUNTER — Encounter (HOSPITAL_COMMUNITY): Payer: Self-pay | Admitting: *Deleted

## 2018-08-05 ENCOUNTER — Ambulatory Visit (HOSPITAL_BASED_OUTPATIENT_CLINIC_OR_DEPARTMENT_OTHER): Payer: Medicare Other

## 2018-08-05 ENCOUNTER — Telehealth: Payer: Self-pay | Admitting: Pharmacist

## 2018-08-05 DIAGNOSIS — I351 Nonrheumatic aortic (valve) insufficiency: Secondary | ICD-10-CM | POA: Diagnosis not present

## 2018-08-05 DIAGNOSIS — Z7901 Long term (current) use of anticoagulants: Secondary | ICD-10-CM | POA: Insufficient documentation

## 2018-08-05 DIAGNOSIS — Z952 Presence of prosthetic heart valve: Secondary | ICD-10-CM

## 2018-08-05 DIAGNOSIS — E785 Hyperlipidemia, unspecified: Secondary | ICD-10-CM | POA: Insufficient documentation

## 2018-08-05 DIAGNOSIS — Z8673 Personal history of transient ischemic attack (TIA), and cerebral infarction without residual deficits: Secondary | ICD-10-CM | POA: Insufficient documentation

## 2018-08-05 DIAGNOSIS — I5021 Acute systolic (congestive) heart failure: Secondary | ICD-10-CM | POA: Insufficient documentation

## 2018-08-05 DIAGNOSIS — I083 Combined rheumatic disorders of mitral, aortic and tricuspid valves: Secondary | ICD-10-CM | POA: Diagnosis not present

## 2018-08-05 DIAGNOSIS — I639 Cerebral infarction, unspecified: Secondary | ICD-10-CM

## 2018-08-05 DIAGNOSIS — I340 Nonrheumatic mitral (valve) insufficiency: Secondary | ICD-10-CM | POA: Insufficient documentation

## 2018-08-05 DIAGNOSIS — I2109 ST elevation (STEMI) myocardial infarction involving other coronary artery of anterior wall: Secondary | ICD-10-CM | POA: Diagnosis not present

## 2018-08-05 DIAGNOSIS — I2102 ST elevation (STEMI) myocardial infarction involving left anterior descending coronary artery: Secondary | ICD-10-CM | POA: Diagnosis not present

## 2018-08-05 DIAGNOSIS — Z7982 Long term (current) use of aspirin: Secondary | ICD-10-CM | POA: Insufficient documentation

## 2018-08-05 DIAGNOSIS — Z7902 Long term (current) use of antithrombotics/antiplatelets: Secondary | ICD-10-CM | POA: Insufficient documentation

## 2018-08-05 HISTORY — PX: TEE WITHOUT CARDIOVERSION: SHX5443

## 2018-08-05 LAB — PROTIME-INR
INR: 2.12
Prothrombin Time: 23.5 seconds — ABNORMAL HIGH (ref 11.4–15.2)

## 2018-08-05 SURGERY — ECHOCARDIOGRAM, TRANSESOPHAGEAL
Anesthesia: Monitor Anesthesia Care

## 2018-08-05 MED ORDER — PROPOFOL 500 MG/50ML IV EMUL
INTRAVENOUS | Status: DC | PRN
  Administered 2018-08-05: 200 ug/kg/min via INTRAVENOUS
  Administered 2018-08-05: 09:00:00 via INTRAVENOUS

## 2018-08-05 MED ORDER — SODIUM CHLORIDE 0.9 % IV SOLN
INTRAVENOUS | Status: DC
  Administered 2018-08-05 (×2): via INTRAVENOUS

## 2018-08-05 MED ORDER — BUTAMBEN-TETRACAINE-BENZOCAINE 2-2-14 % EX AERO
INHALATION_SPRAY | CUTANEOUS | Status: DC | PRN
  Administered 2018-08-05: 2 via TOPICAL

## 2018-08-05 MED ORDER — EPHEDRINE SULFATE-NACL 50-0.9 MG/10ML-% IV SOSY
PREFILLED_SYRINGE | INTRAVENOUS | Status: DC | PRN
  Administered 2018-08-05: 15 mg via INTRAVENOUS
  Administered 2018-08-05: 10 mg via INTRAVENOUS
  Administered 2018-08-05: 15 mg via INTRAVENOUS
  Administered 2018-08-05: 10 mg via INTRAVENOUS

## 2018-08-05 MED ORDER — PHENYLEPHRINE 40 MCG/ML (10ML) SYRINGE FOR IV PUSH (FOR BLOOD PRESSURE SUPPORT)
PREFILLED_SYRINGE | INTRAVENOUS | Status: DC | PRN
  Administered 2018-08-05 (×3): 80 ug via INTRAVENOUS
  Administered 2018-08-05: 40 ug via INTRAVENOUS
  Administered 2018-08-05 (×2): 80 ug via INTRAVENOUS
  Administered 2018-08-05: 120 ug via INTRAVENOUS

## 2018-08-05 NOTE — Telephone Encounter (Signed)
Received call from Endoscopy as they checked INR this morning. INR subtherapeutic at 2.1. Advised to have pt take 3mg  today and 4.5mg  tomorrow then resume usual dosage. Moved next INR check to next week with follow up with Dr. Radford Pax.

## 2018-08-05 NOTE — Progress Notes (Signed)
  Echocardiogram Echocardiogram Transesophageal has been performed.  Jannett Celestine 08/05/2018, 9:36 AM

## 2018-08-05 NOTE — Anesthesia Postprocedure Evaluation (Signed)
Anesthesia Post Note  Patient: Tracy Marshall.  Procedure(s) Performed: TRANSESOPHAGEAL ECHOCARDIOGRAM (TEE) (N/A ) BUBBLE STUDY     Patient location during evaluation: PACU Anesthesia Type: MAC Level of consciousness: awake and alert Pain management: pain level controlled Vital Signs Assessment: post-procedure vital signs reviewed and stable Respiratory status: spontaneous breathing, nonlabored ventilation, respiratory function stable and patient connected to nasal cannula oxygen Cardiovascular status: stable and blood pressure returned to baseline Postop Assessment: no apparent nausea or vomiting Anesthetic complications: no    Last Vitals:  Vitals:   08/05/18 0935 08/05/18 0950  BP: (!) 82/55 (!) 89/46  Pulse: (!) 51 (!) 48  Resp: 18 16  Temp:    SpO2: 98% 100%    Last Pain:  Vitals:   08/05/18 0950  TempSrc:   PainSc: 0-No pain                 Ryan P Ellender

## 2018-08-05 NOTE — CV Procedure (Signed)
INDICATIONS: previous stroke, mechanical AVR, PFO closure device, recent MI  PROCEDURE:   Informed consent was obtained prior to the procedure. The risks, benefits and alternatives for the procedure were discussed and the patient comprehended these risks.  Risks include, but are not limited to, cough, sore throat, vomiting, nausea, somnolence, esophageal and stomach trauma or perforation, bleeding, low blood pressure, aspiration, pneumonia, infection, trauma to the teeth and death.    After a procedural time-out, the oropharynx was anesthetized with 20% benzocaine spray.   During this procedure the patient was administered propofol to achieve and maintain moderate sedation per anesthesia.  The patient's heart rate, blood pressure, and oxygen saturationweare monitored continuously during the procedure. The period of conscious sedation was 35 minutes, of which I was present face-to-face 100% of this time.  The transesophageal probe was inserted in the esophagus and stomach without difficulty and multiple views were obtained.  The patient was kept under observation until the patient left the procedure room.  The patient left the procedure room in stable condition.   Agitated microbubble saline contrast was administered.  COMPLICATIONS:    There were no immediate complications.  FINDINGS:  No definite findings to suggest a thromboembolic cardiac source of stroke and MI.  RECOMMENDATIONS:    Can be dismissed when alert.   Time Spent Directly with the Patient:  60 minutes   Tracy Marshall 08/05/2018, 9:19 AM

## 2018-08-05 NOTE — Transfer of Care (Signed)
Immediate Anesthesia Transfer of Care Note  Patient: Tracy Marshall.  Procedure(s) Performed: TRANSESOPHAGEAL ECHOCARDIOGRAM (TEE) (N/A ) BUBBLE STUDY  Patient Location: Endoscopy Unit  Anesthesia Type:MAC  Level of Consciousness: awake, alert , oriented and patient cooperative  Airway & Oxygen Therapy: Patient Spontanous Breathing and Patient connected to nasal cannula oxygen  Post-op Assessment: Report given to RN, Post -op Vital signs reviewed and stable and Patient moving all extremities  Post vital signs: Reviewed and stable  Last Vitals:  Vitals Value Taken Time  BP 79/51 08/05/2018  9:23 AM  Temp 36.5 C 08/05/2018  9:23 AM  Pulse 54 08/05/2018  9:25 AM  Resp 16 08/05/2018  9:25 AM  SpO2 97 % 08/05/2018  9:25 AM  Vitals shown include unvalidated device data.  Last Pain:  Vitals:   08/05/18 0923  TempSrc: Oral  PainSc: 0-No pain         Complications: No apparent anesthesia complications

## 2018-08-05 NOTE — Discharge Instructions (Signed)
TEE  YOU HAD AN CARDIAC PROCEDURE TODAY: Refer to the procedure report and other information in the discharge instructions given to you for any specific questions about what was found during the examination. If this information does not answer your questions, please call [facility] office at [phone] to clarify.   DIET: Your first meal following the procedure should be a light meal and then it is ok to progress to your normal diet. A half-sandwich or bowl of soup is an example of a good first meal. Heavy or fried foods are harder to digest and may make you feel nauseous or bloated. Drink plenty of fluids but you should avoid alcoholic beverages for 24 hours. If you had a esophageal dilation, please see attached instructions for diet.   ACTIVITY: Your care partner should take you home directly after the procedure. You should plan to take it easy, moving slowly for the rest of the day. You can resume normal activity the day after the procedure however YOU SHOULD NOT DRIVE, use power tools, machinery or perform tasks that involve climbing or major physical exertion for 24 hours (because of the sedation medicines used during the test).   SYMPTOMS TO REPORT IMMEDIATELY: A cardiologist can be reached at any hour. Please call [phone] for any of the following symptoms:  Vomiting of blood or coffee ground material  New, significant abdominal pain  New, significant chest pain or pain under the shoulder blades  Painful or persistently difficult swallowing  New shortness of breath  Black, tarry-looking or red, bloody stools  FOLLOW UP:  Please also call with any specific questions about appointments or follow up tests.  Coumadin instructions:  Take whole tablet tonight (3mg ) and 1.5 tablets tomorrow (4.5mg ), then go back to normal dosing on Sunday.  They will recheck your INR on 2/20 following your appointment with Dr. Radford Pax.

## 2018-08-05 NOTE — Interval H&P Note (Signed)
History and Physical Interval Note:  08/05/2018 8:18 AM  Tracy Marshall.  has presented today for surgery, with the diagnosis of AVR, stemi  The various methods of treatment have been discussed with the patient and family. After consideration of risks, benefits and other options for treatment, the patient has consented to  Procedure(s): TRANSESOPHAGEAL ECHOCARDIOGRAM (TEE) (N/A) as a surgical intervention .  The patient's history has been reviewed, patient examined, no change in status, stable for surgery.  I have reviewed the patient's chart and labs.  Questions were answered to the patient's satisfaction.     Tracy Marshall   INR prior to procedure 2.1

## 2018-08-07 ENCOUNTER — Encounter (HOSPITAL_COMMUNITY): Payer: Self-pay | Admitting: Internal Medicine

## 2018-08-09 DIAGNOSIS — Z0000 Encounter for general adult medical examination without abnormal findings: Secondary | ICD-10-CM | POA: Diagnosis not present

## 2018-08-09 DIAGNOSIS — I2510 Atherosclerotic heart disease of native coronary artery without angina pectoris: Secondary | ICD-10-CM | POA: Diagnosis not present

## 2018-08-09 DIAGNOSIS — Z1159 Encounter for screening for other viral diseases: Secondary | ICD-10-CM | POA: Diagnosis not present

## 2018-08-09 DIAGNOSIS — Z952 Presence of prosthetic heart valve: Secondary | ICD-10-CM | POA: Diagnosis not present

## 2018-08-09 DIAGNOSIS — K219 Gastro-esophageal reflux disease without esophagitis: Secondary | ICD-10-CM | POA: Diagnosis not present

## 2018-08-09 DIAGNOSIS — I712 Thoracic aortic aneurysm, without rupture: Secondary | ICD-10-CM | POA: Diagnosis not present

## 2018-08-09 DIAGNOSIS — E7800 Pure hypercholesterolemia, unspecified: Secondary | ICD-10-CM | POA: Diagnosis not present

## 2018-08-09 DIAGNOSIS — I10 Essential (primary) hypertension: Secondary | ICD-10-CM | POA: Diagnosis not present

## 2018-08-09 DIAGNOSIS — J309 Allergic rhinitis, unspecified: Secondary | ICD-10-CM | POA: Diagnosis not present

## 2018-08-09 DIAGNOSIS — M109 Gout, unspecified: Secondary | ICD-10-CM | POA: Diagnosis not present

## 2018-08-09 DIAGNOSIS — Z125 Encounter for screening for malignant neoplasm of prostate: Secondary | ICD-10-CM | POA: Diagnosis not present

## 2018-08-09 DIAGNOSIS — Z7901 Long term (current) use of anticoagulants: Secondary | ICD-10-CM | POA: Diagnosis not present

## 2018-08-09 DIAGNOSIS — F419 Anxiety disorder, unspecified: Secondary | ICD-10-CM | POA: Diagnosis not present

## 2018-08-10 ENCOUNTER — Encounter: Payer: Self-pay | Admitting: Cardiology

## 2018-08-10 DIAGNOSIS — I2510 Atherosclerotic heart disease of native coronary artery without angina pectoris: Secondary | ICD-10-CM

## 2018-08-10 HISTORY — DX: Atherosclerotic heart disease of native coronary artery without angina pectoris: I25.10

## 2018-08-10 NOTE — Progress Notes (Signed)
Cardiology Office Note:    Date:  08/11/2018   ID:  Tracy Marshall., DOB January 23, 1951, MRN [id number]  PCP:  Tracy Melter, MD  Cardiologist:  Tracy Him, MD    Referring MD: Tracy Melter, MD   Chief Complaint  Patient presents with  . Follow-up    PFO, mechanical AVR, thoracic aortic aneurysm, anterior STEMI, dilated cardiomyopathy    History of Present Illness:    Tracy Marshall. is a 68 y.o. male with a hx of anemia, PFO closure, mechanical AVR on coumadin, thoracic aortic aneurysm, carotid artery disease, GERD, HLD, LBBB, prior CVA x 2, DVT presented on 2/5/2020with anterior STEMI and cath showed thrombotic occlusion of the mid LAD and diag and underwent PTCA with no stent.  Trop peaked at >65.  It was felt that this was related to thrombotic event and not plaque rupture.  Echo showed EF 25-30% with AK of the distal septum and apex.  He was started on ACE I and BB but BP too soft for Entresto.  Discharged on ASA/Plavix and coumadin with plans to stop ASA after 1 month.  Lipitor was increased to 71m daily. He then underwent TEE outpt to rule out source of embolism which showed stable mechanical AVR with mild MR, moderate to severe LV dysfunction with EF 30-35%, mild to moderate MR and mildly dilated ascending aorta at 17mm.    He is here today for followup and is doing well.  He denies any chest pain or pressure, SOB, DOE, PND, orthopnea, LE edema, palpitations or syncope.  He tells me that he does have some problems with dizziness occasionally when he is going from sitting to standing but has not had any syncope.  He is compliant with his meds and is tolerating meds with no SE.    Past Medical History:  Diagnosis Date  . Anemia    iron  . Arthritis   . Atypical moles    melanomna  . Basal cell carcinoma   . Bilateral carotid artery stenosis 08/03/2014   1-39% right and 40-59% left carotid artery stenosis  . CAD (coronary artery disease), native coronary artery  08/10/2018   S/P anterior STEMI secondary to thrombotic occlusion of the mid LAD and diag s/p PTCA with no stent 07/2018  . Chronic anticoagulation    for mechanical AVR  . Cluster headaches   . CVA (cerebral vascular accident) ([place]) 07/24/2014    MRI indicating 2 punctate foci of acute infarction.  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   . Diverticulosis   . Dizziness   . Episodic recurrent vertigo 2002 & 2005   carotid dopplers w no clinically significant stenosis  . Fatty liver    hx of elevated hepatic transaminases-negative workup in 2009 except for U/S suggesting fatty liver  . GERD (gastroesophageal reflux disease)   . Gout   . Heart murmur   . Hyperlipidemia   . Hyperlipidemia LDL goal <70 12/05/2015  . Joint pain   . LBBB (left bundle branch block)   . Melanoma ([place])    hx of melanoma and multiple basal cell carcinomas Dr Tonia Brooms  . PFO (patent foramen ovale) 12/05/2015  . Positive ANA (antinuclear antibody)   . S/P AVR (aortic valve replacement)    mechanical  . TIA (transient ischemic attack)     Past Surgical History:  Procedure Laterality Date  . APPENDECTOMY    . CARDIAC VALVE REPLACEMENT  mechanical  . CHOLECYSTECTOMY    . CORONARY ANGIOGRAPHY N/A 07/27/2018   Procedure: CORONARY ANGIOGRAPHY (CATH LAB);  Surgeon: Burnell Blanks, MD;  Location: [facility];  Service: Cardiovascular;  Laterality: N/A;  . CORONARY/GRAFT ACUTE MI REVASCULARIZATION N/A 07/27/2018   Procedure: CORONARY/GRAFT ACUTE MI REVASCULARIZATION;  Surgeon: Burnell Blanks, MD;  Location: [facility];  Service: Cardiovascular;  Laterality: N/A;  . DENTAL SURGERY Left bone graft and extraction  . ESOPHAGOGASTRODUODENOSCOPY (EGD) WITH PROPOFOL N/A 11/16/2017   Procedure: ESOPHAGOGASTRODUODENOSCOPY (EGD) WITH PROPOFOL;  Surgeon: Wonda Horner, MD;  Location: [facility];  Service: Endoscopy;  Laterality: N/A;   . ESOPHAGOGASTRODUODENOSCOPY (EGD) WITH PROPOFOL N/A 11/25/2017   Procedure: ESOPHAGOGASTRODUODENOSCOPY (EGD) WITH PROPOFOL;  Surgeon: Wilford Corner, MD;  Location: [facility];  Service: Endoscopy;  Laterality: N/A;  . MELANOMA EXCISION     x3  . PATENT FORAMEN OVALE(PFO) CLOSURE N/A 11/17/2017   Procedure: PATENT FORAMEN OVALE (PFO) CLOSURE;  Surgeon: Sherren Mocha, MD;  Location: [facility];  Service: Cardiovascular;  Laterality: N/A;  . POLYPECTOMY  11/16/2017   Procedure: POLYPECTOMY;  Surgeon: Wonda Horner, MD;  Location: [facility];  Service: Endoscopy;;  . SUBMUCOSAL INJECTION  11/25/2017   Procedure: SUBMUCOSAL INJECTION of epinephrine;  Surgeon: Wilford Corner, MD;  Location: [facility];  Service: Endoscopy;;  . TEE WITHOUT CARDIOVERSION N/A 07/31/2014   Procedure: TRANSESOPHAGEAL ECHOCARDIOGRAM (TEE);  Surgeon: Sueanne Margarita, MD;  Location: [facility];  Service: Cardiovascular;  Laterality: N/A;  . TEE WITHOUT CARDIOVERSION N/A 10/01/2014   Procedure: TRANSESOPHAGEAL ECHOCARDIOGRAM (TEE);  Surgeon: Lelon Perla, MD;  Location: [facility]OSCOPY;  Service: Cardiovascular;  Laterality: N/A;  . TEE WITHOUT CARDIOVERSION N/A 08/05/2018   Procedure: TRANSESOPHAGEAL ECHOCARDIOGRAM (TEE);  Surgeon: Elouise Munroe, MD;  Location: [place];  Service: Cardiology;  Laterality: N/A;    Current Medications: Current Meds  Medication Sig  . acetaminophen (TYLENOL) 500 MG tablet Take 1,000 mg by mouth every 6 (six) hours as needed for moderate pain.  [name] aspirin 81 MG tablet Take 81 mg by mouth at bedtime.   [name] atorvastatin (LIPITOR) 80 MG tablet Take 1 tablet (80 mg total) by mouth daily at 6 PM.  . Carboxymethylcellul-Glycerin (LUBRICATING EYE DROPS OP) Place 1 drop into both eyes daily as needed (dry eyes).  . clopidogrel (PLAVIX) 75 MG tablet Take 1 tablet (75 mg total) by mouth daily.  . colchicine 0.6 MG tablet Take 0.6 mg by mouth daily as needed (gout flares).   .  diazepam (VALIUM) 2 MG tablet Take 4 mg by mouth at bedtime as needed for anxiety or sedation.   . fenofibrate (TRICOR) 145 MG tablet Take 1 tablet (145 mg total) by mouth daily. (Patient taking differently: Take 145 mg by mouth every evening. )  . ferrous sulfate 325 (65 FE) MG tablet Take 325 mg by mouth 2 (two) times daily with a meal.  . fluticasone (FLONASE) 50 MCG/ACT nasal spray Place 1-2 sprays into both nostrils daily as needed for allergies.   [name] gabapentin (NEURONTIN) 300 MG capsule Take 900 mg by mouth 3 (three) times daily.   [name] lisinopril (PRINIVIL,ZESTRIL) 2.5 MG tablet Take 1 tablet (2.5 mg total) by mouth daily.  . methocarbamol (ROBAXIN) 500 MG tablet Take 500 mg by mouth every 6 (six) hours as needed for muscle spasms.   . Multiple Vitamins-Minerals (ONE-A-DAY MENS 50+ ADVANTAGE) TABS Take 1 tablet by mouth daily.  . pantoprazole (PROTONIX) 40 MG tablet Take  40 mg by mouth daily.  . ranitidine (ZANTAC) 75 MG tablet Take 75 mg by mouth daily as needed for heartburn.   . warfarin (COUMADIN) 3 MG tablet TAKE AS DIRECTED BY COUMADIN CLINIC (Patient taking differently: Take 1.5-3 mg by mouth See admin instructions. Take 1.5 mg in the evening on Mon and Fri, take 3 mg in the evening on Tues, Wed, Thur, Sat, and Sun)  . [DISCONTINUED] carvedilol (COREG) 3.125 MG tablet Take 1 tablet (3.125 mg total) by mouth 2 (two) times daily with a meal.     Allergies:   Patient has no known allergies.   Social History   Socioeconomic History  . Marital status: Married    Spouse name: Not on file  . Number of children: 0  . Years of education: Not on file  . Highest education level: Not on file  Occupational History  . 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 Financial resource strain: Not on file  . Food insecurity:    Worry: Not on file    Inability: Not on file  . Transportation needs:    Medical: Not on file    Non-medical: Not on file  Tobacco Use  .  Smoking status: Never Smoker  . Smokeless tobacco: Never Used  Substance and Sexual Activity  . Alcohol use: Yes    Alcohol/week: 0.0 standard drinks    Comment: moderate - 2-3 drinks about 3 times per week of wine, liquor, beer  . Drug use: No  . Sexual activity: Not Currently  Lifestyle  . Physical activity:    Days per week: Not on file    Minutes per session: Not on file  . Stress: Not on file  Relationships  . Social connections:    Talks on phone: Not on file    Gets together: Not on file    Attends religious service: Not on file    Active member of club or organization: Not on file    Attends meetings of clubs or organizations: Not on file    Relationship status: Not on file  Other Topics Concern  . Not on file  Social History Narrative   Lives with wife in a one story home.  No children.     Retired from [employer].     Family History: The patient's  family history includes Cancer in his father and mother; Melanoma in an other family member; Psoriasis in an other family member; Scoliosis in his sister; Valvular heart disease in his brother.  ROS:   Please see the history of present illness.    ROS  All other systems reviewed and negative.   EKGs/Labs/Other Studies Reviewed:    The following studies were reviewed today: 2D echo and TEE, cardiac cath  EKG:  EKG is not ordered today.    Recent Labs: 11/23/2017: ALT 16 07/29/2018: Magnesium 1.9 07/30/2018: BUN 15; Creatinine, Ser 1.27; Hemoglobin 15.7; Platelets 301; Potassium 3.9; Sodium 139   Recent Lipid Panel    Component Value Date/Time   CHOL 136 07/29/2018 0322   TRIG 150 (H) 07/29/2018 0322   HDL 41 07/29/2018 0322   CHOLHDL 3.3 07/29/2018 0322   VLDL 30 07/29/2018 0322   LDLCALC 65 07/29/2018 0322    Physical Exam:    VS:  BP 98/60   Pulse (!) 53   Ht 5\' 10"  (1.778 m)   Wt 196 lb (88.9 kg)   SpO2 99%   BMI 28.12 kg/m  Wt Readings from Last 3 Encounters:  08/11/18 196 lb (88.9 kg)    08/05/18 190 lb (86.2 kg)  07/27/18 189 lb 9.5 oz (86 kg)     GEN:  Well nourished, well developed in no acute distress HEENT: Normal NECK: No JVD; No carotid bruits LYMPHATICS: No lymphadenopathy CARDIAC: RRR, no murmurs, rubs, gallops RESPIRATORY:  Clear to auscultation without rales, wheezing or rhonchi  ABDOMEN: Soft, non-tender, non-distended MUSCULOSKELETAL:  No edema; No deformity  SKIN: Warm and dry NEUROLOGIC:  Alert and oriented x 3 PSYCHIATRIC:  Normal affect   ASSESSMENT:    1. Aortic valve disorder   2. Coronary artery disease involving native coronary artery of native heart without angina pectoris   3. PFO (patent foramen ovale)   4. Thoracic aortic aneurysm without rupture ([place])   5. Bilateral carotid artery stenosis   6. Hyperlipidemia LDL goal <70   7. DCM (dilated cardiomyopathy) ([place])    PLAN:    In order of problems listed above:  1.  H/O Mechanical AVR - Recent TEE showed normal functioning AVR with no periprosthetic AI and no evidence of thrombus.   2.  ASCAD - s/p recent anterior MI with occluded mid LAD and Diag felt secondary to embolic event.  He has been doing well since then with no chest pain.  Continue ASA, Plavix and Coumadin until 3/5 and then stop ASA.  Now on high dose statin and BB.    3.  PFO - s/p CVA felt secondary to paradoxical embolism from DVT across PFO.  S/P PFO closure.   4.  Thoracic aortic aneurysm - recent TEE showed a diameter of 33mm similar to CT findings 11/2017 at 55mm.  Repeat Chest CT angio in 1 year to reasess.  Continue statin.  BP controlled.   5.  Bilateral carotid artery stenosis - minimal plaquing by dopplers 01/2018.  6.  Hyperlipidemia - Lipitor increased at time of MI.  Check FLP and ALT in 4 weeks.   7.  Hypercoagulable state - he has had severe DVTs, cardioembolic CVA and now embolic STEMI.  TEE with normal functioning mechanical AVR.  Refer to Hematology for hypercoagulable work-up.  Continue warfarin.   8.   Ischemic dilated cardiomyopathy -he was found to have LV dysfunction status post MI with EF 25 to 30% with akinesis of the distal septum and apex.  Repeat TEE to assess heart monitor showed EF 30 to 35%.  He is on maximum guideline directed therapy for heart failure unfortunately his BP is soft which prevents further up titration of his heart failure medicines.  He is also having some orthostatic problems and is bradycardic so likely having some chronotropic incompetence.  I recommended he change his carvedilol to once daily 3.125 mg nightly.  We will continue on very low-dose ACE inhibitor.  I will repeat an echo in 2 months to see if LV function has improved further.  Medication Adjustments/Labs and Tests Ordered: Current medicines are reviewed at length with the patient today.  Concerns regarding medicines are outlined above.  Orders Placed This Encounter  Procedures  . ECHOCARDIOGRAM COMPLETE   Meds ordered this encounter  Medications  . carvedilol (COREG) 3.125 MG tablet    Sig: Take 1 tablet (3.125 mg total) by mouth at bedtime.    Dispense:  90 tablet    Refill:  3    Dose decrease, do not fill the patient will call.  . nitroGLYCERIN (NITROSTAT) 0.4 MG SL tablet    Sig:  Place 1 tablet (0.4 mg total) under the tongue every 5 (five) minutes as needed for chest pain.    Dispense:  15 tablet    Refill:  3    Signed, Tracy Him, MD  08/11/2018 1:54 PM    [place]

## 2018-08-11 ENCOUNTER — Ambulatory Visit (INDEPENDENT_AMBULATORY_CARE_PROVIDER_SITE_OTHER): Payer: Medicare Other | Admitting: Cardiology

## 2018-08-11 ENCOUNTER — Ambulatory Visit (INDEPENDENT_AMBULATORY_CARE_PROVIDER_SITE_OTHER): Payer: Medicare Other | Admitting: Pharmacist

## 2018-08-11 ENCOUNTER — Telehealth: Payer: Self-pay | Admitting: Cardiology

## 2018-08-11 ENCOUNTER — Encounter: Payer: Self-pay | Admitting: Cardiology

## 2018-08-11 VITALS — BP 98/60 | HR 53 | Ht 70.0 in | Wt 196.0 lb

## 2018-08-11 DIAGNOSIS — Q2112 Patent foramen ovale: Secondary | ICD-10-CM

## 2018-08-11 DIAGNOSIS — I6523 Occlusion and stenosis of bilateral carotid arteries: Secondary | ICD-10-CM | POA: Diagnosis not present

## 2018-08-11 DIAGNOSIS — I712 Thoracic aortic aneurysm, without rupture, unspecified: Secondary | ICD-10-CM

## 2018-08-11 DIAGNOSIS — I420 Dilated cardiomyopathy: Secondary | ICD-10-CM

## 2018-08-11 DIAGNOSIS — Z952 Presence of prosthetic heart valve: Secondary | ICD-10-CM

## 2018-08-11 DIAGNOSIS — Q211 Atrial septal defect: Secondary | ICD-10-CM

## 2018-08-11 DIAGNOSIS — E785 Hyperlipidemia, unspecified: Secondary | ICD-10-CM

## 2018-08-11 DIAGNOSIS — I359 Nonrheumatic aortic valve disorder, unspecified: Secondary | ICD-10-CM | POA: Diagnosis not present

## 2018-08-11 DIAGNOSIS — I2510 Atherosclerotic heart disease of native coronary artery without angina pectoris: Secondary | ICD-10-CM

## 2018-08-11 DIAGNOSIS — Z7901 Long term (current) use of anticoagulants: Secondary | ICD-10-CM

## 2018-08-11 LAB — POCT INR: INR: 3.4 — AB (ref 2.0–3.0)

## 2018-08-11 MED ORDER — NITROGLYCERIN 0.4 MG SL SUBL: 0.4000 mg | SUBLINGUAL_TABLET | SUBLINGUAL | 3 refills | Status: DC | PRN

## 2018-08-11 MED ORDER — CARVEDILOL 3.125 MG PO TABS: 3.1250 mg | ORAL_TABLET | Freq: Every day | ORAL | 3 refills | Status: DC

## 2018-08-11 NOTE — Telephone Encounter (Signed)
Spoke with Lovey Newcomer, advised her that we wanted to know if there was an underlying clotting disorder that could explain his recent STEMI and CVA. She had no further questions.

## 2018-08-11 NOTE — Patient Instructions (Signed)
Description   Continue taking 1 tablet daily except 1/2 tablet on Mondays and Fridays.  Recheck INR in 2 weeks.

## 2018-08-11 NOTE — Telephone Encounter (Signed)
New Message   [place] with Dr. Irene Limbo with the [facility] is calling to obtain additional information on the referral that was sent to them. They want to know the specifics. Please call to discuss.

## 2018-08-11 NOTE — Patient Instructions (Addendum)
Medication Instructions:  Stop: ASA 81 mg, stop on 08/25/18. Decrease: Carvedilol 3/125 mg, at night, by mouth If you need a refill on your cardiac medications before your next appointment, please call your pharmacy.   Lab work: None If you have labs (blood work) drawn today and your tests are completely normal, you will receive your results only by: [name] MyChart Message (if you have MyChart) OR . A paper copy in the mail If you have any lab test that is abnormal or we need to change your treatment, we will call you to review the results.  Testing/Procedures: Your physician has requested that you have an echocardiogram schedule around 10/10/18. Echocardiography is a painless test that uses sound waves to create images of your heart. It provides your doctor with information about the size and shape of your heart and how well your heart's chambers and valves are working. This procedure takes approximately one hour. There are no restrictions for this procedure.  Follow-Up:Your physician recommends that you schedule a follow-up appointment in: 3 months with Dr. Radford Pax

## 2018-08-11 NOTE — Progress Notes (Signed)
[name]    HEMATOLOGY/ONCOLOGY CLINIC NOTE  Date of Service: 08/12/2018  Patient Care Team: Orpah Melter, MD as PCP - General (Family Medicine) Sueanne Margarita, MD as PCP - Cardiology (Cardiology)  CHIEF COMPLAINTS/PURPOSE OF CONSULTATION:  Anticoagulation recommendations for possible nerve ablation procedure for pain control   HISTORY OF PRESENTING ILLNESS:  Tracy Marshall. is a wonderful 68 y.o. male who has been referred to [place] by Dr Olen Pel, Annie Main, MD  for anticoagulation recommendation for possible nerve ablation procedure in his thoracic spine for pain management.  Patient has a history of hypertension, dyslipidemia, gout, systolic cardiac myopathy with an ejection fraction of 4 45-50% , thoracic aortic aneurysm being followed by Dr. Roxan Hockey ,mechanical aortic valve replacement on chronic anticoagulation with Coumadin, PFO, fatty liver.  He has had a CVA presenting with left-sided weakness and requiring an embolectomy with resultant full neurologic recovery. In reviewing his outside medical records and from a neurology input from Dr. Narda Amber the CVA appeared to be likely cardioembolic from his mechanical aortic valve in the setting of subtherapeutic Coumadin levels. Chance of an isolated large clot from his PFO was thought to be less likely.  Patient is following up with his pain management physician in [facility] for persistent thoracic radiculopathy for consideration of possible nerve ablation procedure. His primary care physician would like [place] to in regarding anticoagulation recommendations.  I had a detailed discussion with the patient and his wife regarding the inherent risk of taking him off anticoagulation with regards to recurrent thromboembolic events especially since he had a significant potentially debilitating CVA only a few months back in the setting of borderline subtherapeutic INR is on Coumadin.  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 This is an additional element of risk of anticoagulation.  Since cardiology/Dr. Radford Pax was debating the need for PFO closure we discussed and she recommended doing a hypercoagulable workup since the presence of a thrombophilia would be a relative contraindication for PFO closure device. This was done and was unrevealing.  Interval History:   Tracy Marshall. returns today for management and evaluation of his hypercoagulable state. The patient's last visit with [place] was on 10/08/16. He is accompanied today by his wife. The pt reports that he is doing well overall.  The pt had an acute anterior STEMI secondary to thrombotic occlusion of the mid LAD and diagonal branch on 07/27/18. The pt is taking Aspirin, Plavix and Coumadin. INR at 3.18 on 07/27/18. Also since our last visit, the pt had a PFO closure in May 2019, and ulcerated polyps in stomach for which he went off his coumadin and developed bleeding and was bridged with lovenox and was in ICU for 5 days due to this bleeding. The pt had an ECHO TEE on 08/05/18.  The pt reports that prior to his most recent STEMI, he was on aspirin and coumadin, not plavix. The pt notes that his INR levels have "fluctutaed quite a bit," both higher and lower than therapeutic goal. He notes that over the past 3 months, his lowest INR was 2.1 and up to 4.1.  The pt continues with regular follow ups with her dermatologist regard his history of melanoma. He notes that he is up to speed with his cancer screening. His mother had ovarian cancer in age 25s, and father with  prostate cancer in his late 32s. Several aunts and uncles on maternal side died in age 47s due to cardiac events, but may have been smoking as well. Also reports mother and brother with aortic valve issues. Denies family or personal  history of autoimmune processes.  Lab results (07/30/18) of CBC and BMP is as follows: all values are WNL except for Glucose at 112, Creatinine at 1.27, GFR at 58.  On review of systems, pt reports stable energy levels, and denies leg pain or swelling, testicular pain or swelling, tingling or numbness in hands or feet, neurological symptoms, unexpected weight loss, abdominal pains, and any other symptoms.   MEDICAL HISTORY:  Past Medical History:  Diagnosis Date  . Anemia    iron  . Arthritis   . Atypical moles    melanomna  . Basal cell carcinoma   . Bilateral carotid artery stenosis 08/03/2014   1-39% right and 40-59% left carotid artery stenosis  . CAD (coronary artery disease), native coronary artery 08/10/2018   S/P anterior STEMI secondary to thrombotic occlusion of the mid LAD and diag s/p PTCA with no stent 07/2018  . Chronic anticoagulation    for mechanical AVR  . Cluster headaches   . CVA (cerebral vascular accident) ([place]) 07/24/2014    MRI indicating 2 punctate foci of acute infarction.  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   . Diverticulosis   . Dizziness   . Episodic recurrent vertigo 2002 & 2005   carotid dopplers w no clinically significant stenosis  . Fatty liver    hx of elevated hepatic transaminases-negative workup in 2009 except for U/S suggesting fatty liver  . GERD (gastroesophageal reflux disease)   . Gout   . Heart murmur   . Hyperlipidemia   . Hyperlipidemia LDL goal <70 12/05/2015  . Joint pain   . LBBB (left bundle branch block)   . Melanoma ([place])    hx of melanoma and multiple basal cell carcinomas Dr Tonia Brooms  . PFO (patent foramen ovale) 12/05/2015  . Positive ANA (antinuclear antibody)   . S/P AVR (aortic valve replacement)    mechanical  . TIA (transient ischemic attack)     SURGICAL HISTORY: Past Surgical History:  Procedure Laterality Date  . APPENDECTOMY    . CARDIAC VALVE  REPLACEMENT     mechanical  . CHOLECYSTECTOMY    . CORONARY ANGIOGRAPHY N/A 07/27/2018   Procedure: CORONARY ANGIOGRAPHY (CATH LAB);  Surgeon: Burnell Blanks, MD;  Location: [place] CV LAB;  Service: Cardiovascular;  Laterality: N/A;  . CORONARY/GRAFT ACUTE MI REVASCULARIZATION N/A 07/27/2018   Procedure: CORONARY/GRAFT ACUTE MI REVASCULARIZATION;  Surgeon: Burnell Blanks, MD;  Location: [place] CV LAB;  Service: Cardiovascular;  Laterality: N/A;  . DENTAL SURGERY Left bone graft and extraction  . ESOPHAGOGASTRODUODENOSCOPY (EGD) WITH PROPOFOL N/A 11/16/2017   Procedure: ESOPHAGOGASTRODUODENOSCOPY (EGD) WITH PROPOFOL;  Surgeon: Wonda Horner, MD;  Location: [facility];  Service: Endoscopy;  Laterality: N/A;  . ESOPHAGOGASTRODUODENOSCOPY (EGD) WITH PROPOFOL N/A 11/25/2017   Procedure: ESOPHAGOGASTRODUODENOSCOPY (EGD) WITH PROPOFOL;  Surgeon: Wilford Corner, MD;  Location: [place];  Service: Endoscopy;  Laterality: N/A;  . MELANOMA EXCISION     x3  . PATENT FORAMEN OVALE(PFO) CLOSURE N/A 11/17/2017   Procedure: PATENT FORAMEN OVALE (PFO) CLOSURE;  Surgeon: Sherren Mocha, MD;  Location: [place] CV LAB;  Service: Cardiovascular;  Laterality: N/A;  . POLYPECTOMY  11/16/2017   Procedure: POLYPECTOMY;  Surgeon: Anson Fret  F, MD;  Location: [facility];  Service: Endoscopy;;  . SUBMUCOSAL INJECTION  11/25/2017   Procedure: SUBMUCOSAL INJECTION of epinephrine;  Surgeon: Wilford Corner, MD;  Location: [place];  Service: Endoscopy;;  . TEE WITHOUT CARDIOVERSION N/A 07/31/2014   Procedure: TRANSESOPHAGEAL ECHOCARDIOGRAM (TEE);  Surgeon: Sueanne Margarita, MD;  Location: [facility] ENDOSCOPY;  Service: Cardiovascular;  Laterality: N/A;  . TEE WITHOUT CARDIOVERSION N/A 10/01/2014   Procedure: TRANSESOPHAGEAL ECHOCARDIOGRAM (TEE);  Surgeon: Lelon Perla, MD;  Location[facility] ENDOSCOPY;  Service: Cardiovascular;  Laterality: N/A;  . TEE WITHOUT CARDIOVERSION N/A 08/05/2018    Procedure: TRANSESOPHAGEAL ECHOCARDIOGRAM (TEE);  Surgeon: Elouise Munroe, MD;  Location: [place];  Service: Cardiology;  Laterality: N/A;    SOCIAL HISTORY: Social History   Socioeconomic History  . Marital status: Married    Spouse name: Not on file  . Number of children: 0  . Years of education: Not on file  . Highest education level: Not on file  Occupational History  . 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 Financial resource strain: Not on file  . Food insecurity:    Worry: Not on file    Inability: Not on file  . Transportation needs:    Medical: Not on file    Non-medical: Not on file  Tobacco Use  . Smoking status: Never Smoker  . Smokeless tobacco: Never Used  Substance and Sexual Activity  . Alcohol use: Yes    Alcohol/week: 0.0 standard drinks    Comment: moderate - 2-3 drinks about 3 times per week of wine, liquor, beer  . Drug use: No  . Sexual activity: Not Currently  Lifestyle  . Physical activity:    Days per week: Not on file    Minutes per session: Not on file  . Stress: Not on file  Relationships  . Social connections:    Talks on phone: Not on file    Gets together: Not on file    Attends religious service: Not on file    Active member of club or organization: Not on file    Attends meetings of clubs or organizations: Not on file    Relationship status: Not on file  . Intimate partner violence:    Fear of current or ex partner: Not on file    Emotionally abused: Not on file    Physically abused: Not on file    Forced sexual activity: Not on file  Other Topics Concern  . Not on file  Social History Narrative   Lives with wife in a one story home.  No children.     Retired from [employer].    FAMILY HISTORY: Family History  Problem Relation Age of Onset  . Cancer Mother        melanoma, ovarian  . Cancer Father        prostate  . Melanoma Other   . Psoriasis Other   . Valvular heart disease  Brother   . Scoliosis Sister     ALLERGIES:  has No Known Allergies.  MEDICATIONS:  Current Outpatient Medications  Medication Sig Dispense Refill  . acetaminophen (TYLENOL) 500 MG tablet Take 1,000 mg by mouth every 6 (six) hours as needed for moderate pain.    [name] aspirin 81 MG tablet Take 81 mg by mouth at bedtime.     [name] atorvastatin (LIPITOR) 80 MG tablet Take 1 tablet (80 mg total) by mouth daily at 6 PM. 30 tablet 6  .  Carboxymethylcellul-Glycerin (LUBRICATING EYE DROPS OP) Place 1 drop into both eyes daily as needed (dry eyes).    . carvedilol (COREG) 3.125 MG tablet Take 1 tablet (3.125 mg total) by mouth at bedtime. 90 tablet 3  . clopidogrel (PLAVIX) 75 MG tablet Take 1 tablet (75 mg total) by mouth daily. 30 tablet 11  . colchicine 0.6 MG tablet Take 0.6 mg by mouth daily as needed (gout flares).     . diazepam (VALIUM) 2 MG tablet Take 4 mg by mouth at bedtime as needed for anxiety or sedation.     . fenofibrate (TRICOR) 145 MG tablet Take 1 tablet (145 mg total) by mouth daily. (Patient taking differently: Take 145 mg by mouth every evening. ) 90 tablet 3  . ferrous sulfate 325 (65 FE) MG tablet Take 325 mg by mouth 2 (two) times daily with a meal.    . fluticasone (FLONASE) 50 MCG/ACT nasal spray Place 1-2 sprays into both nostrils daily as needed for allergies.     [name] gabapentin (NEURONTIN) 300 MG capsule Take 900 mg by mouth 3 (three) times daily.     [name] lisinopril (PRINIVIL,ZESTRIL) 2.5 MG tablet Take 1 tablet (2.5 mg total) by mouth daily. 30 tablet 6  . methocarbamol (ROBAXIN) 500 MG tablet Take 500 mg by mouth every 6 (six) hours as needed for muscle spasms.     . Multiple Vitamins-Minerals (ONE-A-DAY MENS 50+ ADVANTAGE) TABS Take 1 tablet by mouth daily.    . nitroGLYCERIN (NITROSTAT) 0.4 MG SL tablet Place 1 tablet (0.4 mg total) under the tongue every 5 (five) minutes as needed for chest pain. 15 tablet 3  . pantoprazole (PROTONIX) 40 MG tablet Take 40 mg by mouth daily.     . ranitidine (ZANTAC) 75 MG tablet Take 75 mg by mouth daily as needed for heartburn.     . warfarin (COUMADIN) 3 MG tablet TAKE AS DIRECTED BY COUMADIN CLINIC (Patient taking differently: Take 1.5-3 mg by mouth See admin instructions. Take 1.5 mg in the evening on Mon and Fri, take 3 mg in the evening on Tues, Wed, Thur, Sat, and Sun) 30 tablet 3   No current facility-administered medications for this visit.     REVIEW OF SYSTEMS:    A 10+ POINT REVIEW OF SYSTEMS WAS OBTAINED including neurology, dermatology, psychiatry, cardiac, respiratory, lymph, extremities, GI, GU, Musculoskeletal, constitutional, breasts, reproductive, HEENT.  All pertinent positives are noted in the HPI.  All others are negative.   PHYSICAL EXAMINATION: ECOG PERFORMANCE STATUS: 1 - Symptomatic but completely ambulatory  . Vitals:   08/12/18 0842  BP: 100/69  Pulse: (!) 54  Resp: 17  Temp: 97.8 F (36.6 C)  SpO2: 99%   Filed Weights   08/12/18 0842  Weight: 197 lb 12.8 oz (89.7 kg)   .Body mass index is 28.38 kg/m.  GENERAL:alert, in no acute distress and comfortable SKIN: no acute rashes, no significant lesions EYES: conjunctiva are pink and non-injected, sclera anicteric OROPHARYNX: MMM, no exudates, no oropharyngeal erythema or ulceration NECK: supple, no JVD LYMPH:  no palpable lymphadenopathy in the cervical, axillary or inguinal regions LUNGS: clear to auscultation b/l with normal respiratory effort HEART: regular rate & rhythm ABDOMEN:  normoactive bowel sounds , non tender, not distended. No palpable hepatosplenomegaly.  Extremity: no pedal edema PSYCH: alert & oriented x 3 with fluent speech NEURO: no focal motor/sensory deficits   LABORATORY DATA:  I have reviewed the data as listed  . CBC Latest Ref Rng &  Units 08/12/2018 07/30/2018 07/29/2018  WBC 4.0 - 10.5 K/uL 4.9 8.7 9.3  Hemoglobin 13.0 - 17.0 g/dL 14.5 15.7 15.9  Hematocrit 39.0 - 52.0 % 44.0 46.3 46.5  Platelets 150 - 400 K/uL  336 301 302    . CMP Latest Ref Rng & Units 08/12/2018 07/30/2018 07/29/2018  Glucose 70 - 99 mg/dL 103(H) 112(H) 111(H)  BUN 8 - 23 mg/dL 19 15 16   Creatinine 0.61 - 1.24 mg/dL 1.17 1.27(H) 1.17  Sodium 135 - 145 mmol/L 139 139 138  Potassium 3.5 - 5.1 mmol/L 4.0 3.9 3.7  Chloride 98 - 111 mmol/L 107 101 106  CO2 22 - 32 mmol/L 22 25 23   Calcium 8.9 - 10.3 mg/dL 9.6 9.9 9.5  Total Protein 6.5 - 8.1 g/dL 7.3 - -  Total Bilirubin 0.3 - 1.2 mg/dL 0.5 - -  Alkaline Phos 38 - 126 U/L 38 - -  AST 15 - 41 U/L 29 - -  ALT 0 - 44 U/L 25 - -   Factor 5 leiden  Order: [id number]  Status:  Final result Visible to patient:  No (Not Released) Next appt:  08/16/2017 at 11:30 AM in Neurology (PATEL, DONIKA, DO) Dx:  Hypercoagulable state ([place])   7d ago  Factor V Leiden Comment   Comments: Result: Negative (no mutation found)        Prothrombin gene mutation  Order: [id number]  Status:  Final result Visible to patient:  No (Not Released) Next appt:  08/16/2017 at 11:30 AM in Neurology (PATEL, DONIKA, DO) Dx:  Hypercoagulable state ([place])   7d ago  Factor II, DNA Analysis Comment   Comments: NEGATIVE  No mutation identified.        Component     Latest Ref Rng & Units 10/16/2016  PTT-LA     0.0 - 51.9 sec 35.3  DRVVT     0.0 - 47.0 sec 46.4  Lupus Anticoag Interp      Comment:  Anticardiolipin Ab,IgG,Qn     0 - 14 GPL U/mL 9  Anticardiolipin Ab,IgM,Qn     0 - 12 MPL U/mL <9  Anticardiolipin Ab,IgA,Qn     0 - 11 APL U/mL 14 (H)  Beta-2 Glycoprotein I Ab, IgG     0 - 20 GPI IgG units <9  Beta-2 Glyco 1 IgA     0 - 25 GPI IgA units <9  Beta-2 Glyco 1 IgM     0 - 32 GPI IgM units <9  Antithrombin Activity     75 - 135 % 109    RADIOGRAPHIC STUDIES: I have personally reviewed the radiological images as listed and agreed with the findings in the report.  [place] ext veins 10/13/2016: Evidence of chronic, non occlusive, deep venous thrombus in the right mid femoral vein. Evidence of  non occlusive, deep venous thrombus, of uncertain age in the right above-the-knee popliteal vein.  No evidence of left lower extremity deep or superficial venous thrombus. No evidence of lower extermity venous incompetence, bilaterally.    ASSESSMENT & PLAN:   68 y.o. man with multiple medical comorbidities as noted above including previous (6948) embolic large residual CVA thought to be from his mechanical aortic valve in the setting of borderline subtherapeutic INR levels. Patient also had a PFO with ultrasound evidence of chronic DVT. He had a hypercoagulable workup as noted above which is unrevealing. He continues to be on Coumadin with a recommendation of being at an INR level of 3-3.5.  1. Recent  STEMI on 07/27/18 -Reviewed pt's most recent labwork from 07/30/18; no increase in PLT with PLT at 301k, normal WBC at 8.7k, HGB normal at 15.7 as well -Discussed that his mechanical aortic valve serves as an ongoing risk factor, even while taking coumadin -Discussed again the results of our previous hypercoagulable work up which was unremarkable, no lupus anticoagulant, no anti-phospholipid antibodies, no Factor V Leiden mutation, no prothrombin gene mutation -Have not checked Protein C and Protein S levels, as these would not be accurate while on coumadin -No indication of essential thrombocytosis nor polycythemia vera -Pt not aware of family history of clotting or blood disorders, and did not have blood clots earlier in his life, making significant inherited blood clotting disorder also less likely -No indication of vasculitis or endothelial dysfunction -Discussed that I cannot rule out a rare clotting disorder, but his non-early-in-life-presentation suggests against this -Therefore, I do suspect that his mechanical vale serves as the most likely explanation of his STEMI, as well as his reported fluctuations in INR levels. -Though his INR levels were appropriate on the 07/27/18 presentation to  hospital, it is quite possible that there were INR fluctuations prior to his arrival which would serve as a risk factor -Nevertheless will take additional labs today to rule out a newly acquired antiphospholipid antibody syndrome and will check Chromogenic Factor X levels as well -Continue on PO Iron while on Aspirin, Coumadin and Plavix, due to the increased risk of blood loss -Xarelto and Eliquis not appropriate in setting of valves -Recommend continuing to minimize variations in INR while on Coumadin -Recommend being aware of drug interactions, steady diet and being aware of Vitamin K -Discussed that it would be appropriate for the pt to seek a second opinion if he desires, possibly with Dr. Sterling Big, Dr. Floreen [place], or Dr. Gareth Morgan, all at [place] staying up to date with age appropriate cancer screening with PCP -Will see the pt back as needed based on labs   Labs today RTC with Dr Irene Limbo based on labs   All of the patients questions were answered with apparent satisfaction. The patient knows to call the clinic with any problems, questions or concerns.  The total time spent in the appt was 45 minutes and more than 50% was on counseling and direct patient cares.    Sullivan Lone MD MS AAHIVMS [facility] Hematology/Oncology Physician [facility]  (Office):       [phone] (Work cell):  [phone] (Fax):           [phone]  08/12/2018 9:49 AM  I, [place], am acting as a scribe for Dr. Sullivan Lone.   .I have reviewed the above documentation for accuracy and completeness, and I agree with the above. Brunetta Genera MD

## 2018-08-12 ENCOUNTER — Inpatient Hospital Stay: Payer: Medicare Other

## 2018-08-12 ENCOUNTER — Encounter: Payer: Self-pay | Admitting: Hematology

## 2018-08-12 ENCOUNTER — Telehealth: Payer: Self-pay | Admitting: Hematology

## 2018-08-12 ENCOUNTER — Inpatient Hospital Stay: Payer: Medicare Other | Attending: Hematology | Admitting: Hematology

## 2018-08-12 VITALS — BP 100/69 | HR 54 | Temp 97.8°F | Resp 17 | Ht 70.0 in | Wt 197.8 lb

## 2018-08-12 DIAGNOSIS — Z952 Presence of prosthetic heart valve: Secondary | ICD-10-CM | POA: Diagnosis not present

## 2018-08-12 DIAGNOSIS — Z7901 Long term (current) use of anticoagulants: Secondary | ICD-10-CM | POA: Insufficient documentation

## 2018-08-12 DIAGNOSIS — K760 Fatty (change of) liver, not elsewhere classified: Secondary | ICD-10-CM | POA: Insufficient documentation

## 2018-08-12 DIAGNOSIS — Z8673 Personal history of transient ischemic attack (TIA), and cerebral infarction without residual deficits: Secondary | ICD-10-CM | POA: Insufficient documentation

## 2018-08-12 DIAGNOSIS — E785 Hyperlipidemia, unspecified: Secondary | ICD-10-CM | POA: Insufficient documentation

## 2018-08-12 DIAGNOSIS — Z7982 Long term (current) use of aspirin: Secondary | ICD-10-CM | POA: Diagnosis not present

## 2018-08-12 DIAGNOSIS — I213 ST elevation (STEMI) myocardial infarction of unspecified site: Secondary | ICD-10-CM | POA: Insufficient documentation

## 2018-08-12 DIAGNOSIS — I749 Embolism and thrombosis of unspecified artery: Secondary | ICD-10-CM

## 2018-08-12 DIAGNOSIS — I82531 Chronic embolism and thrombosis of right popliteal vein: Secondary | ICD-10-CM | POA: Insufficient documentation

## 2018-08-12 DIAGNOSIS — I82511 Chronic embolism and thrombosis of right femoral vein: Secondary | ICD-10-CM | POA: Diagnosis not present

## 2018-08-12 DIAGNOSIS — I712 Thoracic aortic aneurysm, without rupture: Secondary | ICD-10-CM

## 2018-08-12 DIAGNOSIS — G7289 Other specified myopathies: Secondary | ICD-10-CM | POA: Diagnosis not present

## 2018-08-12 DIAGNOSIS — D6859 Other primary thrombophilia: Secondary | ICD-10-CM

## 2018-08-12 DIAGNOSIS — I10 Essential (primary) hypertension: Secondary | ICD-10-CM

## 2018-08-12 DIAGNOSIS — D6869 Other thrombophilia: Secondary | ICD-10-CM | POA: Diagnosis not present

## 2018-08-12 LAB — CMP (CANCER CENTER ONLY)
ALT: 25 U/L (ref 0–44)
ANION GAP: 10 (ref 5–15)
AST: 29 U/L (ref 15–41)
Albumin: 4.2 g/dL (ref 3.5–5.0)
Alkaline Phosphatase: 38 U/L (ref 38–126)
BUN: 19 mg/dL (ref 8–23)
CO2: 22 mmol/L (ref 22–32)
Calcium: 9.6 mg/dL (ref 8.9–10.3)
Chloride: 107 mmol/L (ref 98–111)
Creatinine: 1.17 mg/dL (ref 0.61–1.24)
GFR, Estimated: >60 mL/min (ref >60)
Glucose, Bld: 103 mg/dL — ABNORMAL HIGH (ref 70–99)
Potassium: 4 mmol/L (ref 3.5–5.1)
Sodium: 139 mmol/L (ref 135–145)
Total Bilirubin: 0.5 mg/dL (ref 0.3–1.2)
Total Protein: 7.3 g/dL (ref 6.5–8.1)

## 2018-08-12 LAB — CBC WITH DIFFERENTIAL/PLATELET
Abs Immature Granulocytes: 0.03 10*3/uL (ref 0.00–0.07)
Basophils Absolute: 0 10*3/uL (ref 0.0–0.1)
Basophils Relative: 1 %
Eosinophils Absolute: 0.1 10*3/uL (ref 0.0–0.5)
Eosinophils Relative: 3 %
HCT: 44 % (ref 39.0–52.0)
HEMOGLOBIN: 14.5 g/dL (ref 13.0–17.0)
IMMATURE GRANULOCYTES: 1 %
Lymphocytes Relative: 31 %
Lymphs Abs: 1.5 10*3/uL (ref 0.7–4.0)
MCH: 30 pg (ref 26.0–34.0)
MCHC: 33 g/dL (ref 30.0–36.0)
MCV: 91.1 fL (ref 80.0–100.0)
Monocytes Absolute: 0.5 10*3/uL (ref 0.1–1.0)
Monocytes Relative: 11 %
Neutro Abs: 2.6 10*3/uL (ref 1.7–7.7)
Neutrophils Relative %: 53 %
Platelets: 336 10*3/uL (ref 150–400)
RBC: 4.83 MIL/uL (ref 4.22–5.81)
RDW: 12.8 % (ref 11.5–15.5)
WBC: 4.9 10*3/uL (ref 4.0–10.5)
nRBC: 0 % (ref 0.0–0.2)

## 2018-08-12 LAB — PROTIME-INR
INR: 2.83
PROTHROMBIN TIME: 29.4 s — AB (ref 11.4–15.2)

## 2018-08-12 LAB — SEDIMENTATION RATE: Sed Rate: 1 mm/hr (ref 0–16)

## 2018-08-12 NOTE — Telephone Encounter (Signed)
Gave avs and calendar ° °

## 2018-08-13 LAB — CARDIOLIPIN ANTIBODIES, IGG, IGM, IGA
Anticardiolipin IgA: 32 APL U/mL — ABNORMAL HIGH (ref 0–11)
Anticardiolipin IgG: 16 GPL U/mL — ABNORMAL HIGH (ref 0–14)
Anticardiolipin IgM: <9 MPL U/mL (ref 0–12)

## 2018-08-14 LAB — LUPUS ANTICOAGULANT PANEL
DRVVT: 56.9 s — ABNORMAL HIGH (ref 0.0–47.0)
PTT Lupus Anticoagulant: 38.8 s (ref 0.0–51.9)

## 2018-08-14 LAB — DRVVT MIX: dRVVT Mix: 41 s (ref 0.0–47.0)

## 2018-08-16 LAB — BETA-2-GLYCOPROTEIN I ABS, IGG/M/A
Beta-2 Glyco I IgG: <9 GPI IgG units (ref 0–20)
Beta-2-Glycoprotein I IgA: <9 GPI IgA units (ref 0–25)
Beta-2-Glycoprotein I IgM: <9 GPI IgM units (ref 0–32)

## 2018-08-16 LAB — MISC LABCORP TEST (SEND OUT)
LabCorp test name: 1
Labcorp test code: 500309

## 2018-08-18 ENCOUNTER — Telehealth: Payer: Self-pay | Admitting: *Deleted

## 2018-08-18 ENCOUNTER — Telehealth: Payer: Self-pay | Admitting: Hematology

## 2018-08-18 NOTE — Telephone Encounter (Signed)
Contacted patient regarding test results per Dr. Grier Mitts directions: Please schedule patient for a clinic f/u next available to discuss his testing results. Patient asked if this caller could share results, advised him that scheduling would be contacting him shortly to set up appt to discuss with MD. Patient verbalized understanding.

## 2018-08-18 NOTE — Telephone Encounter (Signed)
Scheduled appt per 2/27 sch message - unable to reach patient - left message with appt date and time

## 2018-08-22 NOTE — Telephone Encounter (Signed)
Pt called to schedule for CR, patient will come in for orientation on 09/13/2018 @ 830AM and will attend the 1:15PM exercise class. Went over insurance, patient verbalized understanding.   Mailed homework package.

## 2018-08-23 LAB — JAK2 (INCLUDING V617F AND EXON 12), MPL,& CALR-NEXT GEN SEQ

## 2018-08-24 ENCOUNTER — Telehealth (HOSPITAL_COMMUNITY): Payer: Self-pay | Admitting: *Deleted

## 2018-08-24 NOTE — Telephone Encounter (Signed)
-----   Message from Sueanne Margarita, MD sent at 08/23/2018  3:51 PM EST ----- Regarding: RE: AAA clearance and BP parameters Rest < 140/65mmHg and exercise < 170/84mmHg Traci ----- Message ----- From: Rowe Pavy, RN Sent: 08/23/2018  11:10 AM EST To: Sueanne Margarita, MD Subject: RE: AAA clearance and BP parameters            Thanks any bp parameters rest and exertional ----- Message ----- From: Sueanne Margarita, MD Sent: 08/23/2018   9:36 AM EST To: Rowe Pavy, RN Subject: RE: AAA clearance and BP parameters            No upper body weight lifting  Traci ----- Message ----- From: Rowe Pavy, RN Sent: 08/23/2018   8:57 AM EST To: Sueanne Margarita, MD Subject: AAA clearance and BP parameters                 Dr. Radford Pax,  The above pt s/p 07/27/28 STEMI 2nd emboli and PTCA to the LAD and D1.  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 The maximal transverse diameter is 4.4 cm, slightly enlarged compared to 4.2 cm previously in 2018.  Any restrictions of activity or bp parameters to observe?   Thanks of much for your input Psychologist, clinical, BSN Cardiac and Pulmonary Rehab Nurse Navigator

## 2018-08-25 ENCOUNTER — Ambulatory Visit (INDEPENDENT_AMBULATORY_CARE_PROVIDER_SITE_OTHER): Payer: Medicare Other | Admitting: Pharmacist

## 2018-08-25 DIAGNOSIS — Z952 Presence of prosthetic heart valve: Secondary | ICD-10-CM

## 2018-08-25 DIAGNOSIS — Q2112 Patent foramen ovale: Secondary | ICD-10-CM

## 2018-08-25 DIAGNOSIS — Q211 Atrial septal defect: Secondary | ICD-10-CM

## 2018-08-25 DIAGNOSIS — I639 Cerebral infarction, unspecified: Secondary | ICD-10-CM

## 2018-08-25 DIAGNOSIS — Z7901 Long term (current) use of anticoagulants: Secondary | ICD-10-CM

## 2018-08-25 LAB — POCT INR: INR: 3 (ref 2.0–3.0)

## 2018-08-25 NOTE — Patient Instructions (Signed)
Description   Continue taking 1 tablet daily except 1/2 tablet on Mondays and Fridays.  Recheck INR in 2 weeks.

## 2018-08-29 NOTE — Progress Notes (Signed)
[name]    HEMATOLOGY/ONCOLOGY CLINIC NOTE  Date of Service: 08/30/2018  Patient Care Team: Orpah Melter, MD as PCP - General (Family Medicine) Sueanne Margarita, MD as PCP - Cardiology (Cardiology)  CHIEF COMPLAINTS/PURPOSE OF CONSULTATION:  Hypercoagulable state   HISTORY OF PRESENTING ILLNESS:  Tracy Kau. is a wonderful 68 y.o. male who has been referred to [place] by Dr Olen Pel, Annie Main, MD  for anticoagulation recommendation for possible nerve ablation procedure in his thoracic spine for pain management.  Patient has a history of hypertension, dyslipidemia, gout, systolic cardiac myopathy with an ejection fraction of 4 45-50% , thoracic aortic aneurysm being followed by Dr. Roxan Hockey ,mechanical aortic valve replacement on chronic anticoagulation with Coumadin, PFO, fatty liver.  He has had a CVA presenting with left-sided weakness and requiring an embolectomy with resultant full neurologic recovery. In reviewing his outside medical records and from a neurology input from Dr. Narda Amber the CVA appeared to be likely cardioembolic from his mechanical aortic valve in the setting of subtherapeutic Coumadin levels. Chance of an isolated large clot from his PFO was thought to be less likely.  Patient is following up with his pain management physician in [facility] for persistent thoracic radiculopathy for consideration of possible nerve ablation procedure. His primary care physician would like [place] to in regarding anticoagulation recommendations.  I had a detailed discussion with the patient and his wife regarding the inherent risk of taking him off anticoagulation with regards to recurrent thromboembolic events especially since he had a significant potentially debilitating CVA only a few months back in the setting of borderline subtherapeutic INR is on Coumadin.  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 This is an additional element of risk of anticoagulation.  Since cardiology/Dr. Radford Pax was debating the need for PFO closure we discussed and she recommended doing a hypercoagulable workup since the presence of a thrombophilia would be a relative contraindication for PFO closure device. This was done and was unrevealing.  Interval History:   Tracy Marshall. returns today for management and evaluation of his hypercoagulable state. The patient's last visit with [place] was on 08/12/18. He is accompanied today by his wife. The pt reports that he is doing well overall.   The pt reports that he has not developed any new concerns in the brief interim. His INR goal is between 3.0 and 3.5. The pt denies concerns for bleeding and continues on aspirin, warfarin and plavix.  Lab results (08/12/18) of CBC w/diff and CMP is as follows: all values are WNL except for Glucose at 103. 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  On review of systems, pt reports stable energy levels, and denies concerns for bleeding, and any other symptoms.  MEDICAL HISTORY:  Past Medical History:  Diagnosis Date  . Anemia    iron  . Arthritis   . Atypical moles    melanomna  . Basal cell carcinoma   . Bilateral carotid artery stenosis 08/03/2014   1-39% right and 40-59% left carotid artery stenosis  . CAD (coronary artery disease), native coronary artery 08/10/2018  S/P anterior STEMI secondary to thrombotic occlusion of the mid LAD and diag s/p PTCA with no stent 07/2018  . Chronic anticoagulation    for mechanical AVR  . Cluster headaches   . CVA  (cerebral vascular accident) ([place]) 07/24/2014    MRI indicating 2 punctate foci of acute infarction.  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   . Diverticulosis   . Dizziness   . Episodic recurrent vertigo 2002 & 2005   carotid dopplers w no clinically significant stenosis  . Fatty liver    hx of elevated hepatic transaminases-negative workup in 2009 except for U/S suggesting fatty liver  . GERD (gastroesophageal reflux disease)   . Gout   . Heart murmur   . Hyperlipidemia   . Hyperlipidemia LDL goal <70 12/05/2015  . Joint pain   . LBBB (left bundle branch block)   . Melanoma ([place])    hx of melanoma and multiple basal cell carcinomas Dr Tonia Brooms  . PFO (patent foramen ovale) 12/05/2015  . Positive ANA (antinuclear antibody)   . S/P AVR (aortic valve replacement)    mechanical  . TIA (transient ischemic attack)     SURGICAL HISTORY: Past Surgical History:  Procedure Laterality Date  . APPENDECTOMY    . CARDIAC VALVE REPLACEMENT     mechanical  . CHOLECYSTECTOMY    . CORONARY ANGIOGRAPHY N/A 07/27/2018   Procedure: CORONARY ANGIOGRAPHY (CATH LAB);  Surgeon: Burnell Blanks, MD;  Location: [place] [facility];  Service: Cardiovascular;  Laterality: N/A;  . CORONARY/GRAFT ACUTE MI REVASCULARIZATION N/A 07/27/2018   Procedure: CORONARY/GRAFT ACUTE MI REVASCULARIZATION;  Surgeon: Burnell Blanks, MD;  Location: [place] [facility];  Service: Cardiovascular;  Laterality: N/A;  . DENTAL SURGERY Left bone graft and extraction  . ESOPHAGOGASTRODUODENOSCOPY (EGD) WITH PROPOFOL N/A 11/16/2017   Procedure: ESOPHAGOGASTRODUODENOSCOPY (EGD) WITH PROPOFOL;  Surgeon: Wonda Horner, MD;  Location: [facility];  Service: Endoscopy;  Laterality: N/A;  . ESOPHAGOGASTRODUODENOSCOPY (EGD) WITH PROPOFOL N/A 11/25/2017   Procedure: ESOPHAGOGASTRODUODENOSCOPY (EGD) WITH PROPOFOL;  Surgeon: Wilford Corner, MD;  Location: [place];  Service: Endoscopy;  Laterality: N/A;  . MELANOMA EXCISION     x3  . PATENT FORAMEN OVALE(PFO) CLOSURE N/A 11/17/2017   Procedure: PATENT FORAMEN OVALE (PFO) CLOSURE;  Surgeon: Sherren Mocha, MD;  Location: [place] [facility];  Service: Cardiovascular;  Laterality: N/A;  . POLYPECTOMY  11/16/2017   Procedure: POLYPECTOMY;  Surgeon: Wonda Horner, MD;  Location: [facility];  Service: Endoscopy;;  . SUBMUCOSAL INJECTION  11/25/2017   Procedure: SUBMUCOSAL INJECTION of epinephrine;  Surgeon: Wilford Corner, MD;  Location: [place];  Service: Endoscopy;;  . TEE WITHOUT CARDIOVERSION N/A 07/31/2014   Procedure: TRANSESOPHAGEAL ECHOCARDIOGRAM (TEE);  Surgeon: Sueanne Margarita, MD;  Location: [facility] ENDOSCOPY;  Service: Cardiovascular;  Laterality: N/A;  . TEE WITHOUT CARDIOVERSION N/A 10/01/2014   Procedure: TRANSESOPHAGEAL ECHOCARDIOGRAM (TEE);  Surgeon: Lelon Perla, MD;  Location: [facility] ENDOSCOPY;  Service: Cardiovascular;  Laterality: N/A;  . TEE WITHOUT CARDIOVERSION N/A 08/05/2018   Procedure: TRANSESOPHAGEAL ECHOCARDIOGRAM (TEE);  Surgeon: Elouise Munroe, MD;  Location: [place];  Service: Cardiology;  Laterality: N/A;    SOCIAL HISTORY: Social History   Socioeconomic History  . Marital status: Married    Spouse name: Not on file  . Number of children: 0  . Years of education: Not on file  . Highest education level: Not on file  Occupational History  . Occupation: retired  Employer: [employer]  Social Needs  . Financial resource strain: Not on file  . Food insecurity:    Worry: Not on file    Inability: Not on file  . Transportation needs:    Medical: Not on file    Non-medical: Not on file  Tobacco Use  . Smoking status: Never Smoker  . Smokeless tobacco: Never Used  Substance and Sexual Activity  . Alcohol use: Yes    Alcohol/week: 0.0 standard drinks    Comment: moderate - 2-3 drinks about 3 times per week of wine, liquor, beer  . Drug use:  No  . Sexual activity: Not Currently  Lifestyle  . Physical activity:    Days per week: Not on file    Minutes per session: Not on file  . Stress: Not on file  Relationships  . Social connections:    Talks on phone: Not on file    Gets together: Not on file    Attends religious service: Not on file    Active member of club or organization: Not on file    Attends meetings of clubs or organizations: Not on file    Relationship status: Not on file  . Intimate partner violence:    Fear of current or ex partner: Not on file    Emotionally abused: Not on file    Physically abused: Not on file    Forced sexual activity: Not on file  Other Topics Concern  . Not on file  Social History Narrative   Lives with wife in a one story home.  No children.     Retired from [employer].    FAMILY HISTORY: Family History  Problem Relation Age of Onset  . Cancer Mother        melanoma, ovarian  . Cancer Father        prostate  . Melanoma Other   . Psoriasis Other   . Valvular heart disease Brother   . Scoliosis Sister     ALLERGIES:  has No Known Allergies.  MEDICATIONS:  Current Outpatient Medications  Medication Sig Dispense Refill  . acetaminophen (TYLENOL) 500 MG tablet Take 1,000 mg by mouth every 6 (six) hours as needed for moderate pain.    [name] aspirin 81 MG tablet Take 81 mg by mouth at bedtime.     [name] atorvastatin (LIPITOR) 80 MG tablet Take 1 tablet (80 mg total) by mouth daily at 6 PM. 30 tablet 6  . Carboxymethylcellul-Glycerin (LUBRICATING EYE DROPS OP) Place 1 drop into both eyes daily as needed (dry eyes).    . carvedilol (COREG) 3.125 MG tablet Take 1 tablet (3.125 mg total) by mouth at bedtime. 90 tablet 3  . clopidogrel (PLAVIX) 75 MG tablet Take 1 tablet (75 mg total) by mouth daily. 30 tablet 11  . colchicine 0.6 MG tablet Take 0.6 mg by mouth daily as needed (gout flares).     . diazepam (VALIUM) 2 MG tablet Take 4 mg by mouth at bedtime as needed for anxiety or  sedation.     . fenofibrate (TRICOR) 145 MG tablet Take 1 tablet (145 mg total) by mouth daily. (Patient taking differently: Take 145 mg by mouth every evening. ) 90 tablet 3  . ferrous sulfate 325 (65 FE) MG tablet Take 325 mg by mouth 2 (two) times daily with a meal.    . fluticasone (FLONASE) 50 MCG/ACT nasal spray Place 1-2 sprays into both nostrils daily as needed for allergies.     [name]  gabapentin (NEURONTIN) 300 MG capsule Take 900 mg by mouth 3 (three) times daily.     [name] lisinopril (PRINIVIL,ZESTRIL) 2.5 MG tablet Take 1 tablet (2.5 mg total) by mouth daily. 30 tablet 6  . methocarbamol (ROBAXIN) 500 MG tablet Take 500 mg by mouth every 6 (six) hours as needed for muscle spasms.     . Multiple Vitamins-Minerals (ONE-A-DAY MENS 50+ ADVANTAGE) TABS Take 1 tablet by mouth daily.    . nitroGLYCERIN (NITROSTAT) 0.4 MG SL tablet Place 1 tablet (0.4 mg total) under the tongue every 5 (five) minutes as needed for chest pain. 15 tablet 3  . pantoprazole (PROTONIX) 40 MG tablet Take 40 mg by mouth daily.    . ranitidine (ZANTAC) 75 MG tablet Take 75 mg by mouth daily as needed for heartburn.     . warfarin (COUMADIN) 3 MG tablet TAKE AS DIRECTED BY COUMADIN CLINIC (Patient taking differently: Take 1.5-3 mg by mouth See admin instructions. Take 1.5 mg in the evening on Mon and Fri, take 3 mg in the evening on Tues, Wed, Thur, Sat, and Sun) 30 tablet 3   No current facility-administered medications for this visit.     REVIEW OF SYSTEMS:    A 10+ POINT REVIEW OF SYSTEMS WAS OBTAINED including neurology, dermatology, psychiatry, cardiac, respiratory, lymph, extremities, GI, GU, Musculoskeletal, constitutional, breasts, reproductive, HEENT.  All pertinent positives are noted in the HPI.  All others are negative.  PHYSICAL EXAMINATION: ECOG PERFORMANCE STATUS: 1 - Symptomatic but completely ambulatory  . Vitals:   08/30/18 0853  BP: 101/63  Pulse: (!) 49  Resp: 18  Temp: 98.2 F (36.8 C)  SpO2:  98%   Filed Weights   08/30/18 0853  Weight: 197 lb 9.6 oz (89.6 kg)   .Body mass index is 28.35 kg/m.  GENERAL:alert, in no acute distress and comfortable SKIN: no acute rashes, no significant lesions EYES: conjunctiva are pink and non-injected, sclera anicteric OROPHARYNX: MMM, no exudates, no oropharyngeal erythema or ulceration NECK: supple, no JVD LYMPH:  no palpable lymphadenopathy in the cervical, axillary or inguinal regions LUNGS: clear to auscultation b/l with normal respiratory effort HEART: regular rate & rhythm ABDOMEN:  normoactive bowel sounds , non tender, not distended. No palpable hepatosplenomegaly.  Extremity: no pedal edema PSYCH: alert & oriented x 3 with fluent speech NEURO: no focal motor/sensory deficits   LABORATORY DATA:  I have reviewed the data as listed  . CBC Latest Ref Rng & Units 08/12/2018 07/30/2018 07/29/2018  WBC 4.0 - 10.5 K/uL 4.9 8.7 9.3  Hemoglobin 13.0 - 17.0 g/dL 14.5 15.7 15.9  Hematocrit 39.0 - 52.0 % 44.0 46.3 46.5  Platelets 150 - 400 K/uL 336 301 302    . CMP Latest Ref Rng & Units 08/12/2018 07/30/2018 07/29/2018  Glucose 70 - 99 mg/dL 103(H) 112(H) 111(H)  BUN 8 - 23 mg/dL 19 15 16   Creatinine 0.61 - 1.24 mg/dL 1.17 1.27(H) 1.17  Sodium 135 - 145 mmol/L 139 139 138  Potassium 3.5 - 5.1 mmol/L 4.0 3.9 3.7  Chloride 98 - 111 mmol/L 107 101 106  CO2 22 - 32 mmol/L 22 25 23   Calcium 8.9 - 10.3 mg/dL 9.6 9.9 9.5  Total Protein 6.5 - 8.1 g/dL 7.3 - -  Total Bilirubin 0.3 - 1.2 mg/dL 0.5 - -  Alkaline Phos 38 - 126 U/L 38 - -  AST 15 - 41 U/L 29 - -  ALT 0 - 44 U/L 25 - -   Factor 5  leiden  Order: [id number]  Status:  Final result Visible to patient:  No (Not Released) Next appt:  08/16/2017 at 11:30 AM in Neurology (PATEL, DONIKA, DO) Dx:  Hypercoagulable state ([place])   7d ago  Factor V Leiden Comment   Comments: Result: Negative (no mutation found)        Prothrombin gene mutation  Order: [id number]  Status:  Final result  Visible to patient:  No (Not Released) Next appt:  08/16/2017 at 11:30 AM in Neurology (PATEL, DONIKA, DO) Dx:  Hypercoagulable state ([place])   7d ago  Factor II, DNA Analysis Comment   Comments: NEGATIVE  No mutation identified.        Component     Latest Ref Rng & Units 10/16/2016  PTT-LA     0.0 - 51.9 sec 35.3  DRVVT     0.0 - 47.0 sec 46.4  Lupus Anticoag Interp      Comment:  Anticardiolipin Ab,IgG,Qn     0 - 14 GPL U/mL 9  Anticardiolipin Ab,IgM,Qn     0 - 12 MPL U/mL <9  Anticardiolipin Ab,IgA,Qn     0 - 11 APL U/mL 14 (H)  Beta-2 Glycoprotein I Ab, IgG     0 - 20 GPI IgG units <9  Beta-2 Glyco 1 IgA     0 - 25 GPI IgA units <9  Beta-2 Glyco 1 IgM     0 - 32 GPI IgM units <9  Antithrombin Activity     75 - 135 % 109           RADIOGRAPHIC STUDIES: I have personally reviewed the radiological images as listed and agreed with the findings in the report.  [place] ext veins 10/13/2016: Evidence of chronic, non occlusive, deep venous thrombus in the right mid femoral vein. Evidence of non occlusive, deep venous thrombus, of uncertain age in the right above-the-knee popliteal vein.  No evidence of left lower extremity deep or superficial venous thrombus. No evidence of lower extermity venous incompetence, bilaterally.    ASSESSMENT & PLAN:   68 y.o. man with multiple medical comorbidities as noted above including previous (9476) embolic large residual CVA thought to be from his mechanical aortic valve in the setting of borderline subtherapeutic INR levels. Patient also had a PFO with ultrasound evidence of chronic DVT. He had a hypercoagulable workup as noted above which is unrevealing. He continues to be on Coumadin with a recommendation of being at an INR level of 3-3.5.  1. Recent STEMI on 07/27/18 -Discussed pt labwork from 08/12/18; blood counts and chemistries normal. Cardiolipin antibodies borderline positive, but not definitively positive, other antiphospholipid  antibodies were negative. 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 -Discussed the impact of excess PA Inhibitors on the shaping of clots adequately, which is associated with early heart disease and inflammation in blood vessels, providing a possible additional explanation for his clotting history -Discussed that I believe that the patient's mechanical valve continues to be his primary risk factor, even while taking coumadin, as the pt did not have early-in-life blood clotting events with his inherited PAI-1 Gene Polymorphism -Discussed that the  recommendation for this pt continues to be lifelong blood thinners. Pt is currently on aspirin, warfarin, and plavix. -Have not checked Protein C and Protein S levels, as these would not be accurate while on coumadin -No indication of essential thrombocytosis nor polycythemia vera -Pt not aware of family history of clotting or blood disorders, and did not have blood clots earlier in his life, making significant inherited blood clotting disorder also less likely -No indication of vasculitis or endothelial dysfunction -Therefore, I do suspect that his mechanical vale serves as the most likely explanation of his STEMI, as well as his reported fluctuations in INR levels. -Though his INR levels were appropriate on the 07/27/18 presentation to hospital, it is quite possible that there were INR fluctuations prior to his arrival which would serve as a risk factor -Continue on PO Iron while on Aspirin, Coumadin and Plavix,  due to the increased risk of blood loss -Xarelto and Eliquis not appropriate in setting of valves -Recommend continuing to minimize variations in INR while on Coumadin -Recommend being aware of drug interactions, steady diet and being aware of Vitamin K -Discussed that it would be appropriate for the pt to seek a second opinion if he desires, possibly with Dr. Sterling Big, Dr. Floreen [place], or Dr. Gareth Morgan, all at [place] looking into anti-angiogenic diet as it pertains to heart disease, stroke, and vascular disease risk reduction. Avoiding processed foods. -Recommend staying active, avoiding dehydration, using compression socks while on travel, avoiding alcohol and caffeine while on long distance travel, and ambulating intermittently while on long distance travel. -Continue staying up to date with age appropriate cancer screening with PCP -Will see the pt back as needed   RTC with Dr Irene Limbo as needed   All of the patients questions were answered with apparent satisfaction. The patient knows to call the clinic with any problems, questions or concerns.  The total time spent in the appt was 30 minutes and more than 50% was on counseling and direct patient cares.    Tracy Lone MD MS AAHIVMS [facility] Hematology/Oncology Physician [facility]  (Office):       [phone] (Work cell):  [phone] (Fax):           [phone]  08/30/2018 9:43 AM  I, Tracy Marshall, am acting as a scribe for Dr. Sullivan Marshall.   .I have reviewed the above documentation for accuracy and completeness, and I agree with the above. Tracy Genera MD

## 2018-08-30 ENCOUNTER — Telehealth: Payer: Self-pay | Admitting: Hematology

## 2018-08-30 ENCOUNTER — Encounter: Payer: Self-pay | Admitting: Hematology

## 2018-08-30 ENCOUNTER — Inpatient Hospital Stay: Payer: Medicare Other | Attending: Hematology | Admitting: Hematology

## 2018-08-30 ENCOUNTER — Other Ambulatory Visit: Payer: Self-pay

## 2018-08-30 VITALS — BP 101/63 | HR 49 | Temp 98.2°F | Resp 18 | Ht 70.0 in | Wt 197.6 lb

## 2018-08-30 DIAGNOSIS — Z85820 Personal history of malignant melanoma of skin: Secondary | ICD-10-CM | POA: Diagnosis not present

## 2018-08-30 DIAGNOSIS — Z7982 Long term (current) use of aspirin: Secondary | ICD-10-CM | POA: Diagnosis not present

## 2018-08-30 DIAGNOSIS — I213 ST elevation (STEMI) myocardial infarction of unspecified site: Secondary | ICD-10-CM | POA: Diagnosis not present

## 2018-08-30 DIAGNOSIS — Z952 Presence of prosthetic heart valve: Secondary | ICD-10-CM

## 2018-08-30 DIAGNOSIS — D6859 Other primary thrombophilia: Secondary | ICD-10-CM | POA: Diagnosis not present

## 2018-08-30 DIAGNOSIS — Z8042 Family history of malignant neoplasm of prostate: Secondary | ICD-10-CM | POA: Diagnosis not present

## 2018-08-30 DIAGNOSIS — Z7901 Long term (current) use of anticoagulants: Secondary | ICD-10-CM | POA: Diagnosis not present

## 2018-08-30 NOTE — Patient Instructions (Signed)
Thank you for choosing [facility] to provide your oncology and hematology care.  To afford each patient quality time with our providers, please arrive 30 minutes before your scheduled appointment time.  If you arrive late for your appointment, you may be asked to reschedule.  We strive to give you quality time with our providers, and arriving late affects you and other patients whose appointments are after yours.    If you are a no show for multiple scheduled visits, you may be dismissed from the clinic at the providers discretion.     Again, thank you for choosing [facility], our hope is that these requests will decrease the amount of time that you wait before being seen by our physicians.  ______________________________________________________________________   Should you have questions after your visit to the [facility], please contact our office at [phone] between the hours of 8:30 and 4:30 p.m.    Voicemails left after 4:30p.m will not be returned until the following business day.     For prescription refill requests, please have your pharmacy contact us directly.  Please also try to allow 48 hours for prescription requests.     Please contact the scheduling department for questions regarding scheduling.  For scheduling of procedures such as PET scans, CT scans, MRI, Ultrasound, etc please contact central scheduling at [phone].     Resources For Cancer Patients and Caregivers:    Oncolink.org:  A wonderful resource for patients and healthcare providers for information regarding your disease, ways to tract your treatment, what to expect, etc.      American Cancer Society:  800-227-2345  Can help patients locate various types of support and financial assistance   Cancer Care: 1-800-813-HOPE (4673) Provides financial assistance, online support groups, medication/co-pay assistance.     [place] DSS:  336-641-3447 Where to apply  for food stamps, Medicaid, and utility assistance   Medicare Rights Center: 800-333-4114 Helps people with Medicare understand their rights and benefits, navigate the Medicare system, and secure the quality healthcare they deserve   SCAT: 336-333-6589 [place] [employer]'s shared-ride transportation service for eligible riders who have a disability that prevents them from riding the fixed route bus.     For additional information on assistance programs please contact our social worker:   Abigail Elmore:  [phone]  

## 2018-08-30 NOTE — Telephone Encounter (Signed)
Per 3/10 los RTC with Dr Irene Limbo as needed

## 2018-09-05 ENCOUNTER — Telehealth (HOSPITAL_COMMUNITY): Payer: Self-pay

## 2018-09-05 NOTE — Telephone Encounter (Signed)
Called pt and let him know that the department is closing the next 2 weeks left vm

## 2018-09-13 ENCOUNTER — Ambulatory Visit (HOSPITAL_COMMUNITY): Payer: Medicare Other

## 2018-09-13 ENCOUNTER — Telehealth: Payer: Self-pay

## 2018-09-13 NOTE — Telephone Encounter (Signed)

## 2018-09-15 ENCOUNTER — Encounter: Payer: Self-pay | Admitting: Hematology

## 2018-09-15 ENCOUNTER — Ambulatory Visit (INDEPENDENT_AMBULATORY_CARE_PROVIDER_SITE_OTHER): Payer: Medicare Other | Admitting: Pharmacist

## 2018-09-15 ENCOUNTER — Other Ambulatory Visit: Payer: Self-pay

## 2018-09-15 DIAGNOSIS — Z952 Presence of prosthetic heart valve: Secondary | ICD-10-CM

## 2018-09-15 DIAGNOSIS — Z7901 Long term (current) use of anticoagulants: Secondary | ICD-10-CM

## 2018-09-15 DIAGNOSIS — Q2112 Patent foramen ovale: Secondary | ICD-10-CM

## 2018-09-15 DIAGNOSIS — Z1589 Genetic susceptibility to other disease: Secondary | ICD-10-CM | POA: Insufficient documentation

## 2018-09-15 DIAGNOSIS — Q211 Atrial septal defect: Secondary | ICD-10-CM

## 2018-09-15 LAB — POCT INR: INR: 4 — AB (ref 2.0–3.0)

## 2018-09-19 ENCOUNTER — Ambulatory Visit (HOSPITAL_COMMUNITY): Payer: Medicare Other

## 2018-09-21 ENCOUNTER — Ambulatory Visit (HOSPITAL_COMMUNITY): Payer: Medicare Other

## 2018-09-21 ENCOUNTER — Other Ambulatory Visit: Payer: Self-pay | Admitting: Thoracic Surgery (Cardiothoracic Vascular Surgery)

## 2018-09-21 DIAGNOSIS — I712 Thoracic aortic aneurysm, without rupture, unspecified: Secondary | ICD-10-CM

## 2018-09-22 DIAGNOSIS — M546 Pain in thoracic spine: Secondary | ICD-10-CM | POA: Diagnosis not present

## 2018-09-22 DIAGNOSIS — M47814 Spondylosis without myelopathy or radiculopathy, thoracic region: Secondary | ICD-10-CM | POA: Diagnosis not present

## 2018-09-22 DIAGNOSIS — G8929 Other chronic pain: Secondary | ICD-10-CM | POA: Diagnosis not present

## 2018-09-22 DIAGNOSIS — G894 Chronic pain syndrome: Secondary | ICD-10-CM | POA: Diagnosis not present

## 2018-09-23 ENCOUNTER — Ambulatory Visit (HOSPITAL_COMMUNITY): Payer: Medicare Other

## 2018-09-26 ENCOUNTER — Ambulatory Visit (HOSPITAL_COMMUNITY): Payer: Medicare Other

## 2018-09-28 ENCOUNTER — Ambulatory Visit (HOSPITAL_COMMUNITY): Payer: Medicare Other

## 2018-09-28 ENCOUNTER — Telehealth: Payer: Self-pay

## 2018-09-28 NOTE — Telephone Encounter (Signed)

## 2018-09-29 ENCOUNTER — Other Ambulatory Visit: Payer: Self-pay

## 2018-09-29 ENCOUNTER — Ambulatory Visit (INDEPENDENT_AMBULATORY_CARE_PROVIDER_SITE_OTHER): Payer: Medicare Other | Admitting: Pharmacist

## 2018-09-29 DIAGNOSIS — Z7901 Long term (current) use of anticoagulants: Secondary | ICD-10-CM

## 2018-09-29 DIAGNOSIS — Z952 Presence of prosthetic heart valve: Secondary | ICD-10-CM

## 2018-09-29 DIAGNOSIS — Q211 Atrial septal defect: Secondary | ICD-10-CM | POA: Diagnosis not present

## 2018-09-29 DIAGNOSIS — Q2112 Patent foramen ovale: Secondary | ICD-10-CM

## 2018-09-29 LAB — POCT INR: INR: 4.7 — AB (ref 2.0–3.0)

## 2018-09-30 ENCOUNTER — Ambulatory Visit (HOSPITAL_COMMUNITY): Payer: Medicare Other

## 2018-10-03 ENCOUNTER — Ambulatory Visit (HOSPITAL_COMMUNITY): Payer: Medicare Other

## 2018-10-03 NOTE — Progress Notes (Signed)
Virtual Visit via Video Note The purpose of this virtual visit is to provide medical care while limiting exposure to the novel coronavirus.    Consent was obtained for video visit:  Yes.   Answered questions that patient had about telehealth interaction:  Yes.   I discussed the limitations, risks, security and privacy concerns of performing an evaluation and management service by telemedicine. I also discussed with the patient that there may be a patient responsible charge related to this service. The patient expressed understanding and agreed to proceed.  Pt location: Home Physician Location: office Name of referring provider:  Orpah Melter, MD I connected with Tracy Marshall. at patients initiation/request on 10/04/2018 at  9:00 AM EDT by video enabled telemedicine application and verified that I am speaking with the correct person using two identifiers. Pt MRN:  [id number] Pt DOB:  29-Oct-1950 Video Participants:  Tracy Marshall.   History of Present Illness: This is a 68 y.o. male with s/p AVR with mechanical valve (2001) on chronic anticoagulation with coumadin, GERD, melanoma and basal cell carcinoma of the skin, and carotid stenosis (L 40-59% and R 1-39%), and history of stroke x 2 (2016 and 2018), s/p PFO closure returning for follow-up of mild cognitive impairment and history of stroke.    His neuropsychological testing showed mild cognitive changes, likely due to history of stroke and depression.  Since his last visit, he has not noticed any marked changes in memory.  He remains highly independent with all IADLs and ADLs. No problems with managing medications, finances, or driving.   In February 2020, he suffered acute MI and was treated with thrombectomy. In addition to aspirin and coumadin, plavix was added to his regimen.   He has been evaluated by Dr. Irene Limbo in hematology for underlying clotting disorder, which did not suggest underlying hereditary disorder.    He has not  had any new interval numbness/tingling or limb weakness.    Past Medical History:  Diagnosis Date  . Anemia    iron  . Arthritis   . Atypical moles    melanomna  . Basal cell carcinoma   . Bilateral carotid artery stenosis 08/03/2014   1-39% right and 40-59% left carotid artery stenosis  . CAD (coronary artery disease), native coronary artery 08/10/2018   S/P anterior STEMI secondary to thrombotic occlusion of the mid LAD and diag s/p PTCA with no stent 07/2018  . Chronic anticoagulation    for mechanical AVR  . Cluster headaches   . CVA (cerebral vascular accident) ([place]) 07/24/2014    MRI indicating 2 punctate foci of acute infarction.  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   . Diverticulosis   . Dizziness   . Episodic recurrent vertigo 2002 & 2005   carotid dopplers w no clinically significant stenosis  . Fatty liver    hx of elevated hepatic transaminases-negative workup in 2009 except for U/S suggesting fatty liver  . GERD (gastroesophageal reflux disease)   . Gout   . Heart murmur   . Hyperlipidemia   . Hyperlipidemia LDL goal <70 12/05/2015  . Joint pain   . LBBB (left bundle branch block)   . Melanoma ([place])    hx of melanoma and multiple basal cell carcinomas Dr Tonia Brooms  . PFO (patent foramen ovale) 12/05/2015  . Positive ANA (antinuclear antibody)   . S/P AVR (aortic valve replacement)    mechanical  . TIA (transient ischemic attack)  Current Outpatient Medications on File Prior to Visit  Medication Sig Dispense Refill  . acetaminophen (TYLENOL) 500 MG tablet Take 1,000 mg by mouth every 6 (six) hours as needed for moderate pain.    [name] aspirin 81 MG tablet Take 81 mg by mouth at bedtime.     [name] atorvastatin (LIPITOR) 80 MG tablet Take 1 tablet (80 mg total) by mouth daily at 6 PM. 30 tablet 6  . Carboxymethylcellul-Glycerin (LUBRICATING EYE DROPS OP) Place 1 drop into both eyes daily as needed (dry  eyes).    . carvedilol (COREG) 3.125 MG tablet Take 1 tablet (3.125 mg total) by mouth at bedtime. 90 tablet 3  . clopidogrel (PLAVIX) 75 MG tablet Take 1 tablet (75 mg total) by mouth daily. 30 tablet 11  . colchicine 0.6 MG tablet Take 0.6 mg by mouth daily as needed (gout flares).     . diazepam (VALIUM) 2 MG tablet Take 4 mg by mouth at bedtime as needed for anxiety or sedation.     . fenofibrate (TRICOR) 145 MG tablet Take 1 tablet (145 mg total) by mouth daily. (Patient taking differently: Take 145 mg by mouth every evening. ) 90 tablet 3  . ferrous sulfate 325 (65 FE) MG tablet Take 325 mg by mouth 2 (two) times daily with a meal.    . fluticasone (FLONASE) 50 MCG/ACT nasal spray Place 1-2 sprays into both nostrils daily as needed for allergies.     [name] gabapentin (NEURONTIN) 300 MG capsule Take 900 mg by mouth 3 (three) times daily.     [name] lisinopril (PRINIVIL,ZESTRIL) 2.5 MG tablet Take 1 tablet (2.5 mg total) by mouth daily. 30 tablet 6  . methocarbamol (ROBAXIN) 500 MG tablet Take 500 mg by mouth every 6 (six) hours as needed for muscle spasms.     . Multiple Vitamins-Minerals (ONE-A-DAY MENS 50+ ADVANTAGE) TABS Take 1 tablet by mouth daily.    . nitroGLYCERIN (NITROSTAT) 0.4 MG SL tablet Place 1 tablet (0.4 mg total) under the tongue every 5 (five) minutes as needed for chest pain. 15 tablet 3  . pantoprazole (PROTONIX) 40 MG tablet Take 40 mg by mouth daily.    . ranitidine (ZANTAC) 75 MG tablet Take 75 mg by mouth daily as needed for heartburn.     . warfarin (COUMADIN) 3 MG tablet TAKE AS DIRECTED BY COUMADIN CLINIC (Patient taking differently: Take 1.5-3 mg by mouth See admin instructions. Take 1.5 mg in the evening on Mon and Fri, take 3 mg in the evening on Tues, Wed, Thur, Sat, and Sun) 30 tablet 3   No current facility-administered medications on file prior to visit.      Review of Systems:  CONSTITUTIONAL: No fevers, chills, night sweats, or weight loss.  EYES: No visual  changes or eye pain ENT: No hearing changes.  No history of nose bleeds.   RESPIRATORY: No cough, wheezing and shortness of breath.   CARDIOVASCULAR: Negative for chest pain, and palpitations.   GI: Negative for abdominal discomfort, blood in stools or black stools.  No recent change in bowel habits.   GU:  No history of incontinence.   MUSCLOSKELETAL: No history of joint pain or swelling.  No myalgias.   SKIN: Negative for lesions, rash, and itching.   ENDOCRINE: Negative for cold or heat intolerance, polydipsia or goiter.   PSYCH:  No depression or anxiety symptoms.   NEURO: As Above.  Observations/Objective:   BP 103/71 Patient is awake, alert, and appears comfortable.  Oriented x 4.  Extraocular muscles are intact. No ptosis.  Face is symmetric.  Speech is not dysarthric. Tongue is midline. Antigravity in all extremities.  No pronator drift. Gait appears normal   Assessment and Plan:  1.  Mild cognitive impairment in the setting of prior stroke, contributed by depression.  Stable without progression and remains highly independent with all IADLs and ADLs.  No evidence of dementia.   He was encouraged to stay active both physically and mentally. He is walking up to 1 mile daily and will restart cardiac rehab when safe to do so.   2.  History of ischemic stroke in the setting of mechanical aortic valve on coumadin s/p PFO closure (2019).  No residual deficits.  No interval stroke.   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  Suspected subtherapeutic INR  - US carotids 2019 did not show significant stenosis  - Continue coumadin and ASA.  Plavix 75mg  was added for cardiac indication due to recent MI  - Continue atorvastatin 80mg  daily and T[place] 145mg  daily   Follow Up Instructions:   I discussed the  assessment and treatment plan with the patient. The patient was provided an opportunity to ask questions and all were answered. The patient agreed with the plan and demonstrated an understanding of the instructions.   The patient was advised to call back or seek an in-person evaluation if the symptoms worsen or if the condition fails to improve as anticipated.  Follow-up in 1 year   Alda Berthold, DO

## 2018-10-04 ENCOUNTER — Other Ambulatory Visit: Payer: Self-pay

## 2018-10-04 ENCOUNTER — Encounter: Payer: Self-pay | Admitting: *Deleted

## 2018-10-04 ENCOUNTER — Telehealth (INDEPENDENT_AMBULATORY_CARE_PROVIDER_SITE_OTHER): Payer: Medicare Other | Admitting: Neurology

## 2018-10-04 DIAGNOSIS — Z8673 Personal history of transient ischemic attack (TIA), and cerebral infarction without residual deficits: Secondary | ICD-10-CM | POA: Diagnosis not present

## 2018-10-04 DIAGNOSIS — G3184 Mild cognitive impairment, so stated: Secondary | ICD-10-CM

## 2018-10-05 ENCOUNTER — Ambulatory Visit (HOSPITAL_COMMUNITY): Payer: Medicare Other

## 2018-10-06 ENCOUNTER — Telehealth: Payer: Self-pay | Admitting: Internal Medicine

## 2018-10-06 NOTE — Telephone Encounter (Signed)
Reviewed chart Echo ordered to follow up LVEF   Will forward to T Turner for review Will postpone echo for now given corona virus concerns and restrictions Resched for June or until restrictions ease

## 2018-10-07 ENCOUNTER — Ambulatory Visit (HOSPITAL_COMMUNITY): Payer: Medicare Other

## 2018-10-07 ENCOUNTER — Telehealth: Payer: Self-pay | Admitting: Cardiology

## 2018-10-07 NOTE — Telephone Encounter (Signed)
New  Message:   Patient calling concerning his appt that has been changed twice and he would like a understanding on what he should do. please call patient.

## 2018-10-07 NOTE — Telephone Encounter (Signed)
I spoke to the patient who is questioning why his Echo continues to get moved around, canceled by Dr Harrington Challenger for 4/20.  He is ok and understands the [place] Virus situation, but at the same time is wondering how his heart is doing after the heart attack.    He would like a call from [place] after he discusses this more with Dr Radford Pax and hopefully gets a set date.  He was thankful for the call.

## 2018-10-08 NOTE — Telephone Encounter (Signed)
Please reassure patient that it is fine to hold off on echo for now as long as he is feeling fine.  He is at high risk for complications if he contracts COVID 19 due to underlying heart disease.  Will reschedule echo for June when it is safe

## 2018-10-10 ENCOUNTER — Ambulatory Visit (HOSPITAL_COMMUNITY): Payer: Medicare Other

## 2018-10-10 NOTE — Telephone Encounter (Signed)
Spoke with the patient, he expressed understanding about delaying to June time.

## 2018-10-11 ENCOUNTER — Telehealth: Payer: Self-pay

## 2018-10-11 NOTE — Telephone Encounter (Signed)

## 2018-10-12 ENCOUNTER — Telehealth (HOSPITAL_COMMUNITY): Payer: Self-pay | Admitting: Cardiac Rehabilitation

## 2018-10-12 ENCOUNTER — Ambulatory Visit (HOSPITAL_COMMUNITY): Payer: Medicare Other

## 2018-10-12 NOTE — Telephone Encounter (Signed)
Phone call to patient to discuss outpatient cardiac rehab.  Pt is interested in participating once program reopens from [place] 19 precautions.  Pt is eager to learn lifestyle modifications to reduce risk factors. Pt exercise at home includes walking.  Pt is eager to have repeat echocardiogram to evaluate LV function.  Although pt reports symptoms are greatly improved.    Pt admits to eating moderate heart healthy diet.   Pt is following Covid prevention measures. Pt offered emotional support and reassurance. Pt expressed appreciation for the call.  Andi Hence, RN, BSN Cardiac Pulmonary Rehab

## 2018-10-13 ENCOUNTER — Ambulatory Visit (INDEPENDENT_AMBULATORY_CARE_PROVIDER_SITE_OTHER): Payer: Medicare Other | Admitting: *Deleted

## 2018-10-13 ENCOUNTER — Other Ambulatory Visit: Payer: Self-pay

## 2018-10-13 DIAGNOSIS — Q211 Atrial septal defect: Secondary | ICD-10-CM | POA: Diagnosis not present

## 2018-10-13 DIAGNOSIS — Z952 Presence of prosthetic heart valve: Secondary | ICD-10-CM | POA: Diagnosis not present

## 2018-10-13 DIAGNOSIS — Z7901 Long term (current) use of anticoagulants: Secondary | ICD-10-CM | POA: Diagnosis not present

## 2018-10-13 DIAGNOSIS — Q2112 Patent foramen ovale: Secondary | ICD-10-CM

## 2018-10-13 LAB — POCT INR: INR: 3.5 — AB (ref 2.0–3.0)

## 2018-10-13 NOTE — Patient Instructions (Signed)
Description   Spoke with pt and instructed pt to continue taking 1 tablet daily except 1/2 tablet on Mondays, Wednesdays, and Fridays.  Recheck INR in 3 weeks.

## 2018-10-14 ENCOUNTER — Ambulatory Visit (HOSPITAL_COMMUNITY): Payer: Medicare Other

## 2018-10-17 ENCOUNTER — Ambulatory Visit (HOSPITAL_COMMUNITY): Payer: Medicare Other

## 2018-10-19 ENCOUNTER — Ambulatory Visit (HOSPITAL_COMMUNITY): Payer: Medicare Other

## 2018-10-20 DIAGNOSIS — M47814 Spondylosis without myelopathy or radiculopathy, thoracic region: Secondary | ICD-10-CM | POA: Diagnosis not present

## 2018-10-20 DIAGNOSIS — G894 Chronic pain syndrome: Secondary | ICD-10-CM | POA: Diagnosis not present

## 2018-10-21 ENCOUNTER — Ambulatory Visit (HOSPITAL_COMMUNITY): Payer: Medicare Other

## 2018-10-24 ENCOUNTER — Ambulatory Visit (HOSPITAL_COMMUNITY): Payer: Medicare Other

## 2018-10-26 ENCOUNTER — Ambulatory Visit (HOSPITAL_COMMUNITY): Payer: Medicare Other

## 2018-10-28 ENCOUNTER — Ambulatory Visit (HOSPITAL_COMMUNITY): Payer: Medicare Other

## 2018-10-31 ENCOUNTER — Ambulatory Visit (HOSPITAL_COMMUNITY): Payer: Medicare Other

## 2018-11-01 ENCOUNTER — Telehealth (HOSPITAL_COMMUNITY): Payer: Self-pay | Admitting: *Deleted

## 2018-11-01 NOTE — Telephone Encounter (Signed)

## 2018-11-02 ENCOUNTER — Ambulatory Visit (HOSPITAL_COMMUNITY): Payer: Medicare Other

## 2018-11-02 ENCOUNTER — Telehealth: Payer: Self-pay

## 2018-11-02 NOTE — Telephone Encounter (Signed)
lmom for prescreen  

## 2018-11-03 ENCOUNTER — Other Ambulatory Visit: Payer: Self-pay

## 2018-11-03 ENCOUNTER — Other Ambulatory Visit: Payer: Self-pay | Admitting: Cardiology

## 2018-11-03 ENCOUNTER — Ambulatory Visit (INDEPENDENT_AMBULATORY_CARE_PROVIDER_SITE_OTHER): Payer: Medicare Other | Admitting: *Deleted

## 2018-11-03 ENCOUNTER — Other Ambulatory Visit (HOSPITAL_COMMUNITY): Payer: Medicare Other

## 2018-11-03 ENCOUNTER — Ambulatory Visit (HOSPITAL_COMMUNITY): Payer: Medicare Other | Attending: Physician Assistant

## 2018-11-03 DIAGNOSIS — Q211 Atrial septal defect: Secondary | ICD-10-CM

## 2018-11-03 DIAGNOSIS — I420 Dilated cardiomyopathy: Secondary | ICD-10-CM | POA: Insufficient documentation

## 2018-11-03 DIAGNOSIS — Z952 Presence of prosthetic heart valve: Secondary | ICD-10-CM | POA: Diagnosis not present

## 2018-11-03 DIAGNOSIS — Z7901 Long term (current) use of anticoagulants: Secondary | ICD-10-CM | POA: Diagnosis not present

## 2018-11-03 DIAGNOSIS — Q2112 Patent foramen ovale: Secondary | ICD-10-CM

## 2018-11-03 LAB — POCT INR: INR: 3.8 — AB (ref 2.0–3.0)

## 2018-11-03 MED ORDER — PERFLUTREN LIPID MICROSPHERE
1.0000 mL | INTRAVENOUS | Status: AC | PRN
  Administered 2018-11-03: 2 mL via INTRAVENOUS

## 2018-11-03 NOTE — Patient Instructions (Signed)
Description   Spoke with pt and instructed pt to take 1/2 tablet today then continue taking 1 tablet daily except 1/2 tablet on Mondays, Wednesdays, and Fridays.  Recheck INR in 3 weeks.

## 2018-11-04 ENCOUNTER — Telehealth (INDEPENDENT_AMBULATORY_CARE_PROVIDER_SITE_OTHER): Payer: Medicare Other | Admitting: Physician Assistant

## 2018-11-04 ENCOUNTER — Ambulatory Visit (HOSPITAL_COMMUNITY): Payer: Medicare Other

## 2018-11-04 VITALS — BP 99/68 | HR 62 | Wt 190.0 lb

## 2018-11-04 DIAGNOSIS — Z952 Presence of prosthetic heart valve: Secondary | ICD-10-CM

## 2018-11-04 DIAGNOSIS — I712 Thoracic aortic aneurysm, without rupture, unspecified: Secondary | ICD-10-CM

## 2018-11-04 DIAGNOSIS — K922 Gastrointestinal hemorrhage, unspecified: Secondary | ICD-10-CM

## 2018-11-04 DIAGNOSIS — Z8774 Personal history of (corrected) congenital malformations of heart and circulatory system: Secondary | ICD-10-CM

## 2018-11-04 DIAGNOSIS — I255 Ischemic cardiomyopathy: Secondary | ICD-10-CM

## 2018-11-04 NOTE — Progress Notes (Addendum)
[facility]   MULTIDISCIPLINARY HEART VALVE TEAM     Virtual Visit via Video Note   This visit type was conducted due to national recommendations for restrictions regarding the COVID-19 Pandemic (e.g. social distancing) in an effort to limit this patient's exposure and mitigate transmission in our community.  Due to his co-morbid illnesses, this patient is at least at moderate risk for complications without adequate follow up.  This format is felt to be most appropriate for this patient at this time.  All issues noted in this document were discussed and addressed.  A limited physical exam was performed with this format.  Please refer to the patient's chart for his consent to telehealth for [facility].   Evaluation Performed:  Follow-up visit  Date:  11/04/2018   ID:  Tracy Malta., DOB 21-Jul-1950, MRN [id number]  Patient Location: Home Provider Location: Office  PCP:  Orpah Melter, MD  Cardiologist:  Fransico Him, MD  Electrophysiologist:  None   Chief Complaint:  1 year s/p PFO closure  History of Present Illness:    Tracy Burtch. is a 68 y.o. 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  The patient does not have symptoms concerning for COVID-19 infection (fever, chills, cough, or new shortness of breath).   He has a hx of bicuspid aortic stenosis and underwent mechanical aortic valve replacement in 2001.Since that time he has had recurrent TIA/stroke episodes. Initially he had slurred speech a few weeks after his valve replacement and he was felt to have had a TIA. He had some recurrent neurologic symptoms in 2015/2016 in the setting of an illness and MRI imaging of the brain was done.This demonstrated multiple small infarcts, with subacute infarcts in the right insula and punctate infarcts in the left frontal  cortex as well as old infarcts in the right temporoparietal region.Findings were felt to be consistent with a central embolic source. A TEE at that time did not show any plaque in the aortic arch or any thrombus on the patient's mechanical valve. He was noted to have a PFO. Recommendations were made to continue oral anticoagulation as well as antiplatelet therapy with aspirin. The patient then presented in February 2018 with acute onset left-sided weakness, numbness, and vision changes he was found to have a proximal right MCA infarct with large vessel occlusion at the right ICA terminus extending to the right M1 and A1 segments.His INR was 2.3. He was managed with mechanical embolectomy and improved immediately. A surface echocardiogram demonstrated a PFO but no other significant abnormalities. He was bridged with Lovenox until his INR was back in the range of 2.5-3.5. When he was seen back in April 2018, a venous duplex study was performed because of his recurrent stroke and known PFO. Despite therapeutic anticoagulation, he was diagnosed with chronic DVT in the right mid femoral vein and age-indeterminate DVT in the right above-knee popliteal vein.   He was seen by Dr. Burt Knack in consultation forconsideration of transcatheter PFO closure. Hereviewed his TEE from 2016 which demonstrated a moderate to large PFO with strongly positive bubble study demonstrating R--->L shunt.Dr. Burt Knack felt like he would be a candidate for PFO closure given his known PFOwith recurrent TIA/stroke in patient with mechanical aortic prosthesis on long-term anticoagulation and documented DVT.This was set up in 07/2017 but then cancelled because pre admission labs revealed a Hg  of 7. He was sent to Dr.Ganemwith GIand underwent an extensive GI work up. No etiology of this anemia was found, but he did have polyps that required removal.His hemoglobin improvedto ~12and he was set up for endoscopy and polypectomy on 5/28  followed by PFO closure the following day 5/29. Coumadin was bridged with Lovenox.   He underwent successful transcatheter PFO closure using a 25 mm Amplatzer PFO Occluder on 11/17/17. Follow up limited echo showed no obvious PFO by color doppler. He was started on ASA 81 mg daily to continued indefinitely if tolerated. Coumadin was resumed.   He was then readmitted 6/4-11/28/17 for near syncope and melena. He was transfused and underwent upper endoscopy and underwent EPI/saline submucosally injected with good blanching. Discharge Hg 10.6.  He was admitted 2/8-07/30/18 for anterior STEMI secondary to thrombotic occlusion of the mid LAD and Diagonal branch. Teated both branches with balloon angioplasty and no stenting. Felt to be thrombotic vs embolic as opposed to plaque rupture. Troponin peaked >65. He was started on triple therapy x 1 month with ASA, plavix, coumadin. Echo showed EF 25-30% with akinesis of the distal septum and apex. Lipitor was increased to 46m daily. He then underwent TEE outpt to rule out source of embolism which showed stable mechanical AVR with mild MR, moderate to severe LV dysfunction with EF 30-35%, mild to moderate MR and mildly dilated ascending aorta at 52mm. Medical therapy for ischemic CM has been limited by hypotension. He was referred to hematology for hypercoagulable work up given recurrent issues with thrombosis. Hypercoagulable workup was unrevealing. He continues to be on Coumadin with a recommendation of being at an INR level of 3-3.5. Dr. Irene Limbo felt his mechanical valve in the setting of INR fluctuations was the most likely explanation for his STEMI.   Today he presents for follow up of his PFO closure. No CP or SOB. No LE edema, orthopnea or PND. He has orthostatic dizziness but no syncope. No blood in stool or urine. No palpitations. He remains quite active with no issues and walks every day. Sometimes he even jogs. He is disappointed that his heart still remains weak but  thankful he is not limited by heart failure.    Past Medical History:  Diagnosis Date   Anemia    iron   Arthritis    Atypical moles    melanomna   Basal cell carcinoma    Bilateral carotid artery stenosis 08/03/2014   1-39% right and 40-59% left carotid artery stenosis   CAD (coronary artery disease), native coronary artery 08/10/2018   S/P anterior STEMI secondary to thrombotic occlusion of the mid LAD and diag s/p PTCA with no stent 07/2018   Chronic anticoagulation    for mechanical AVR   Cluster headaches    CVA (cerebral vascular accident) ([place]) 07/24/2014    MRI indicating 2 punctate foci of acute infarction.  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    Diverticulosis    Dizziness    Episodic recurrent vertigo 2002 & 2005   carotid dopplers w no clinically significant stenosis   Fatty liver    hx of elevated hepatic transaminases-negative workup in 2009 except for U/S suggesting fatty liver   GERD (gastroesophageal reflux disease)    Gout    Heart murmur    Hyperlipidemia    Hyperlipidemia LDL goal <70 12/05/2015   Joint pain    LBBB (left bundle branch block)    Melanoma ([place])  hx of melanoma and multiple basal cell carcinomas Dr Tonia Brooms   PFO (patent foramen ovale) 12/05/2015   Positive ANA (antinuclear antibody)    S/P AVR (aortic valve replacement)    mechanical   TIA (transient ischemic attack)    Past Surgical History:  Procedure Laterality Date   APPENDECTOMY     CARDIAC VALVE REPLACEMENT     mechanical   CHOLECYSTECTOMY     CORONARY ANGIOGRAPHY N/A 07/27/2018   Procedure: CORONARY ANGIOGRAPHY (CATH LAB);  Surgeon: Burnell Blanks, MD;  Location: [place] [facility];  Service: Cardiovascular;  Laterality: N/A;   CORONARY/GRAFT ACUTE MI REVASCULARIZATION N/A 07/27/2018   Procedure: CORONARY/GRAFT ACUTE MI REVASCULARIZATION;  Surgeon: Burnell Blanks, MD;   Location: [place] [facility];  Service: Cardiovascular;  Laterality: N/A;   DENTAL SURGERY Left bone graft and extraction   ESOPHAGOGASTRODUODENOSCOPY (EGD) WITH PROPOFOL N/A 11/16/2017   Procedure: ESOPHAGOGASTRODUODENOSCOPY (EGD) WITH PROPOFOL;  Surgeon: Wonda Horner, MD;  Location: [facility];  Service: Endoscopy;  Laterality: N/A;   ESOPHAGOGASTRODUODENOSCOPY (EGD) WITH PROPOFOL N/A 11/25/2017   Procedure: ESOPHAGOGASTRODUODENOSCOPY (EGD) WITH PROPOFOL;  Surgeon: Wilford Corner, MD;  Location: [place];  Service: Endoscopy;  Laterality: N/A;   MELANOMA EXCISION     x3   PATENT FORAMEN OVALE(PFO) CLOSURE N/A 11/17/2017   Procedure: PATENT FORAMEN OVALE (PFO) CLOSURE;  Surgeon: Sherren Mocha, MD;  Location: [place] [facility];  Service: Cardiovascular;  Laterality: N/A;   POLYPECTOMY  11/16/2017   Procedure: POLYPECTOMY;  Surgeon: Wonda Horner, MD;  Location: [facility];  Service: Endoscopy;;   SUBMUCOSAL INJECTION  11/25/2017   Procedure: SUBMUCOSAL INJECTION of epinephrine;  Surgeon: Wilford Corner, MD;  Location: [place];  Service: Endoscopy;;   TEE WITHOUT CARDIOVERSION N/A 07/31/2014   Procedure: TRANSESOPHAGEAL ECHOCARDIOGRAM (TEE);  Surgeon: Sueanne Margarita, MD;  Location: [facility];  Service: Cardiovascular;  Laterality: N/A;   TEE WITHOUT CARDIOVERSION N/A 10/01/2014   Procedure: TRANSESOPHAGEAL ECHOCARDIOGRAM (TEE);  Surgeon: Lelon Perla, MD;  Location: [facility] ENDOSCOPY;  Service: Cardiovascular;  Laterality: N/A;   TEE WITHOUT CARDIOVERSION N/A 08/05/2018   Procedure: TRANSESOPHAGEAL ECHOCARDIOGRAM (TEE);  Surgeon: Elouise Munroe, MD;  Location: [place];  Service: Cardiology;  Laterality: N/A;     Current Meds  Medication Sig   acetaminophen (TYLENOL) 500 MG tablet Take 1,000 mg by mouth every 6 (six) hours as needed for moderate pain.   atorvastatin (LIPITOR) 80 MG tablet Take 1 tablet (80 mg total) by mouth daily at 6 PM.    Carboxymethylcellul-Glycerin (LUBRICATING EYE DROPS OP) Place 1 drop into both eyes daily as needed (dry eyes).   carvedilol (COREG) 3.125 MG tablet Take 1 tablet (3.125 mg total) by mouth at bedtime. (Patient taking differently: Take 3.125 mg by mouth 2 (two) times daily with a meal. )   clopidogrel (PLAVIX) 75 MG tablet Take 1 tablet (75 mg total) by mouth daily.   colchicine 0.6 MG tablet Take 0.6 mg by mouth daily as needed (gout flares).    diazepam (VALIUM) 2 MG tablet Take 4 mg by mouth at bedtime as needed for anxiety or sedation.    fenofibrate (TRICOR) 145 MG tablet Take 1 tablet (145 mg total) by mouth daily. (Patient taking differently: Take 145 mg by mouth every evening. )   ferrous sulfate 325 (65 FE) MG tablet Take 325 mg by mouth 2 (two) times daily with a meal.   fluticasone (FLONASE) 50 MCG/ACT nasal spray Place 1-2 sprays into both nostrils daily  as needed for allergies.    gabapentin (NEURONTIN) 300 MG capsule Take 900 mg by mouth 3 (three) times daily.    lisinopril (PRINIVIL,ZESTRIL) 2.5 MG tablet Take 1 tablet (2.5 mg total) by mouth daily.   methocarbamol (ROBAXIN) 500 MG tablet Take 500 mg by mouth every 6 (six) hours as needed for muscle spasms.    Multiple Vitamins-Minerals (ONE-A-DAY MENS 50+ ADVANTAGE) TABS Take 1 tablet by mouth daily.   nitroGLYCERIN (NITROSTAT) 0.4 MG SL tablet Place 1 tablet (0.4 mg total) under the tongue every 5 (five) minutes as needed for chest pain.   pantoprazole (PROTONIX) 40 MG tablet Take 40 mg by mouth daily.   warfarin (COUMADIN) 3 MG tablet TAKE AS DIRECTED BY COUMADIN CLINIC (Patient taking differently: Take 1.5-3 mg by mouth See admin instructions. Take 1.5 mg in the evening on Mon and Fri, take 3 mg in the evening on Tues, Wed, Thur, Sat, and Sun)   [DISCONTINUED] aspirin 81 MG tablet Take 81 mg by mouth at bedtime.      Allergies:   Patient has no known allergies.   Social History   Tobacco Use   Smoking status:  Never Smoker   Smokeless tobacco: Never Used  Substance Use Topics   Alcohol use: Yes    Alcohol/week: 0.0 standard drinks    Comment: moderate - 2-3 drinks about 3 times per week of wine, liquor, beer   Drug use: No     Family Hx: The patient's family history includes Cancer in his father and mother; Melanoma in an other family member; Psoriasis in an other family member; Scoliosis in his sister; Valvular heart disease in his brother.  ROS:   Please see the history of present illness.    All other systems reviewed and are negative.   Prior CV studies:   The following studies were reviewed today:  11/17/17 Conclusion  Successful transcatheter PFO closure using a 25 mm Amplatzer PFO Occluder  Recommend:  ASA 81 mg daily indefinitely if tolerated  Warfarin - resume tonight per Coumadin Clinic instructions  Lovenox bridging - resume tonight  SBE prophylaxis indefinitely in setting mechanical aortic valve   _______________   Limited echo 11/17/17 Study Conclusions - HPI and indications: Limited study for PFO (patent foramen ovale) closure. - Procedure narrative: Transthoracic echocardiography. Image quality was poor. The study was technically difficult, as a result of restricted patient mobility. - Atrial septum: Off axis images. There is an amplatzer occluder device noted across the atrial septum. No obvious shunting by color doppler. Bubble study was not performed. Impressions: - Technically difficult limited study for PFO closure. Amplatzer device shown in position - no obvious PFO by color doppler, suggestive of successful PFO closure.  _______________   Echo 11/03/2018 IMPRESSIONS  1. The left ventricle has moderate-severely reduced systolic function, with an ejection fraction of 30-35%. The cavity size was normal. There is mildly increased left ventricular wall thickness. Left ventricular diastolic Doppler parameters are  consistent with  impaired relaxation.  2. The right ventricle has normal systolic function. The cavity was normal.  3. The mitral valve is grossly normal.  4. There is mild dilatation of the ascending aorta measuring 43 mm.  5. Left atrial size was moderately dilated.  6. Akinesis of the distal septum and apex with overall moderate to severe LV dysfunction; mild LVH; mild diastolic dysfunction; mildly dilated ascending aorta; s/p AVR with mean gradient 22 mmHg and trace AI; mild MR; moderate LAE; s/p PFO closure with no  residual shunt and negative saline microcavitation study.  Labs/Other Tests and Data Reviewed:    EKG:  No ECG reviewed.  Recent Labs: 07/29/2018: Magnesium 1.9 08/12/2018: ALT 25; BUN 19; Creatinine 1.17; Hemoglobin 14.5; Platelets 336; Potassium 4.0; Sodium 139   Recent Lipid Panel Lab Results  Component Value Date/Time   CHOL 136 07/29/2018 03:22 AM   TRIG 150 (H) 07/29/2018 03:22 AM   HDL 41 07/29/2018 03:22 AM   CHOLHDL 3.3 07/29/2018 03:22 AM   LDLCALC 65 07/29/2018 03:22 AM    Wt Readings from Last 3 Encounters:  11/04/18 190 lb (86.2 kg)  10/04/18 190 lb (86.2 kg)  08/30/18 197 lb 9.6 oz (89.6 kg)     Objective:    Vital Signs:  BP 99/68    Pulse 62    Wt 190 lb (86.2 kg)    BMI 27.26 kg/m    Well nourished, well developed male in no acute distress.   ASSESSMENT & PLAN:    PFO with recurrent stokes: Echo with bubble showed normally functioning PFO closure with no residual shunt and negative saline microcavitation study. No new neurologic sx. He is on triple therapy with aspirin, plavix and coumadin given recent STEMI felt to be thrombotic/embolic related. Per Dr. Theodosia Blender note said to stop aspirin 3/5, so he will stop that now.   Ischemic CM: EF remains ~ 30-35% on echo today. On maximally tolerated GDMT, titration limited by hypotension. Fortunately, he has no s/s CHF and is able to remain quite physically active with NYHA class I symptoms.   Mechanical AVR on  coumadin: INR goal 3.0-3.5 with recurrent thrombosis   GI bleeding: no melena. Recent blood counts stable in February.   TAA: followed by Dr. Radford Pax / Dr. Roxan Hockey ([facility])  STEMI/hypercoaguable state: he was referred to hematology for hypercoagulable work up given recurrent issues with thrombosis. Hypercoagulable workup was unrevealing. He continues to be on Coumadin with a recommendation of being at an INR level of 3-3.5. Dr. Irene Limbo felt his mechanical valve in the setting of INR fluctuations was the most likely explanation for his STEMI.   COVID-19 Education: the signs and symptoms of COVID-19 were discussed with the patient and how to seek care for testing (follow up with PCP or arrange E-visit).  The importance of social distancing was discussed today.  Time:   Today, I have spent 15 minutes with the patient with telehealth technology discussing the above problems.     Medication Adjustments/Labs and Tests Ordered: Current medicines are reviewed at length with the patient today.  Concerns regarding medicines are outlined above.   Tests Ordered: No orders of the defined types were placed in this encounter.   Medication Changes: STOP aspirin  Disposition:  Follow up PRN with structural heart clinic   Signed, Angelena Form, PA-C  11/04/2018 12:58 PM    [place]

## 2018-11-04 NOTE — Addendum Note (Signed)
Addended by: Eileen Stanford on: 11/04/2018 12:58 PM   Modules accepted: Orders

## 2018-11-06 NOTE — Progress Notes (Signed)
Tracy Marshall - this patient has a history of CVA in setting of PFO and mechanical valve and has a hypercoagulable state followed by Hematology.  He had a STEMI from [place] event.  His EF is still 30-35% with chronic LBBB despite max tolerated GDMT.  Due to soft BPs cannot titrate HF meds any further.  Needs thoughts on ICD for primary prevention

## 2018-11-07 ENCOUNTER — Ambulatory Visit (HOSPITAL_COMMUNITY): Payer: Medicare Other

## 2018-11-09 ENCOUNTER — Telehealth: Payer: Self-pay | Admitting: Cardiology

## 2018-11-09 ENCOUNTER — Ambulatory Visit (HOSPITAL_COMMUNITY): Payer: Medicare Other

## 2018-11-09 ENCOUNTER — Telehealth: Payer: Self-pay

## 2018-11-09 DIAGNOSIS — Q211 Atrial septal defect: Secondary | ICD-10-CM

## 2018-11-09 DIAGNOSIS — Z8774 Personal history of (corrected) congenital malformations of heart and circulatory system: Secondary | ICD-10-CM

## 2018-11-09 DIAGNOSIS — Z952 Presence of prosthetic heart valve: Secondary | ICD-10-CM

## 2018-11-09 DIAGNOSIS — Q2112 Patent foramen ovale: Secondary | ICD-10-CM

## 2018-11-09 NOTE — Telephone Encounter (Signed)
Spoke with the patient he expressed understanding about having an echo in 3 months. He had no further questions.

## 2018-11-09 NOTE — Telephone Encounter (Signed)
-----   Message from Sueanne Margarita, MD sent at 11/07/2018 12:56 PM EDT ----- Disregard the order for cardiac MRI - he has a mechanical AVR.  Just repeat 2D echo in 3 months.  Traci ----- Message ----- From: Thompson Grayer, MD Sent: 11/07/2018  12:42 PM EDT To: Sueanne Margarita, MD  With his hypercoagulability and goal INR of 3-3.5, he is at risk with the procedure. At risk for stroke with a lower INR... at risk for device pocket hematoma/ bleeding with a higher one...  I think we would have to be real careful. If CHF symptoms warrant CRT then we should do it... if not, then I dont think I would do it just for a primary prevention ICD.

## 2018-11-09 NOTE — Telephone Encounter (Signed)
Virtual Visit Pre-Appointment Phone Call  "(Name), I am calling you today to discuss your upcoming appointment. We are currently trying to limit exposure to the virus that causes COVID-19 by seeing patients at home rather than in the office."  1. "What is the BEST phone number to call the day of the visit?" - include this in appointment notes  2. Do you have or have access to (through a family member/friend) a smartphone with video capability that we can use for your visit?" a. If yes - list this number in appt notes as cell (if different from BEST phone #) and list the appointment type as a VIDEO visit in appointment notes b. If no - list the appointment type as a PHONE visit in appointment notes  3. Confirm consent - "In the setting of the current Covid19 crisis, you are scheduled for a (phone or video) visit with your provider on (date) at (time).  Just as we do with many in-office visits, in order for you to participate in this visit, we must obtain consent.  If you'd like, I can send this to your mychart (if signed up) or email for you to review.  Otherwise, I can obtain your verbal consent now.  All virtual visits are billed to your insurance company just like a normal visit would be.  By agreeing to a virtual visit, we'd like you to understand that the technology does not allow for your provider to perform an examination, and thus may limit your provider's ability to fully assess your condition. If your provider identifies any concerns that need to be evaluated in person, we will make arrangements to do so.  Finally, though the technology is pretty good, we cannot assure that it will always work on either your or our end, and in the setting of a video visit, we may have to convert it to a phone-only visit.  In either situation, we cannot ensure that we have a secure connection.  Are you willing to proceed?" STAFF: Did the patient verbally acknowledge consent to telehealth visit? Document  YES/NO here: [email or web address] [phone]/ verbal consent 11/09/18  4. Advise patient to be prepared - "Two hours prior to your appointment, go ahead and check your blood pressure, pulse, oxygen saturation, and your weight (if you have the equipment to check those) and write them all down. When your visit starts, your provider will ask you for this information. If you have an Apple Watch or Kardia device, please plan to have heart rate information ready on the day of your appointment. Please have a pen and paper handy nearby the day of the visit as well."  5. Give patient instructions for MyChart download to smartphone OR [email or web address] as below if video visit (depending on what platform provider is using)  6. Inform patient they will receive a phone call 15 minutes prior to their appointment time (may be from unknown caller ID) so they should be prepared to answer    TELEPHONE CALL NOTE  Tracy Malta. has been deemed a candidate for a follow-up tele-health visit to limit community exposure during the Covid-19 pandemic. I spoke with the patient via phone to ensure availability of phone/video source, confirm preferred email & phone number, and discuss instructions and expectations.  I reminded Tracy Malta. to be prepared with any vital sign and/or heart rhythm information that could potentially be obtained via home monitoring, at the time of his visit. I reminded Tracy Rota  Jr. to expect a phone call prior to his visit.  Tracy Marshall 11/09/2018 12:23 PM   INSTRUCTIONS FOR DOWNLOADING THE MYCHART APP TO SMARTPHONE  - The patient must first make sure to have activated MyChart and know their login information - If Apple, go to [employer] and type in MyChart in the search bar and download the app. If Android, ask patient to go to [employer] and type in [place] in the search bar and download the app. The app is free but as with any other app downloads, their phone  may require them to verify saved payment information or Apple/Android password.  - The patient will need to then log into the app with their MyChart username and password, and select [facility] as their healthcare provider to link the account. When it is time for your visit, go to the MyChart app, find appointments, and click Begin Video Visit. Be sure to Select Allow for your device to access the Microphone and Camera for your visit. You will then be connected, and your provider will be with you shortly.  **If they have any issues connecting, or need assistance please contact MyChart service desk [phone])**  **If using a computer, in order to ensure the best quality for their visit they will need to use either of the following Internet Browsers: [employer], or Google Chrome**  IF USING DOXIMITY or DOXY.ME - The patient will receive a link just prior to their visit by text.     FULL LENGTH CONSENT FOR TELE-HEALTH VISIT   I hereby voluntarily request, consent and authorize [facility] and its employed or contracted physicians, physician assistants, nurse practitioners or other licensed health care professionals (the Practitioner), to provide me with telemedicine health care services (the Services") as deemed necessary by the treating Practitioner. I acknowledge and consent to receive the Services by the Practitioner via telemedicine. I understand that the telemedicine visit will involve communicating with the Practitioner through live audiovisual communication technology and the disclosure of certain medical information by electronic transmission. I acknowledge that I have been given the opportunity to request an in-person assessment or other available alternative prior to the telemedicine visit and am voluntarily participating in the telemedicine visit.  I understand that I have the right to withhold or withdraw my consent to the use of telemedicine in the course of my  care at any time, without affecting my right to future care or treatment, and that the Practitioner or I may terminate the telemedicine visit at any time. I understand that I have the right to inspect all information obtained and/or recorded in the course of the telemedicine visit and may receive copies of available information for a reasonable fee.  I understand that some of the potential risks of receiving the Services via telemedicine include:   Delay or interruption in medical evaluation due to technological equipment failure or disruption;  Information transmitted may not be sufficient (e.g. poor resolution of images) to allow for appropriate medical decision making by the Practitioner; and/or   In rare instances, security protocols could fail, causing a breach of personal health information.  Furthermore, I acknowledge that it is my responsibility to provide information about my medical history, conditions and care that is complete and accurate to the best of my ability. I acknowledge that Practitioner's advice, recommendations, and/or decision may be based on factors not within their control, such as incomplete or inaccurate data provided by me or distortions of diagnostic images or  specimens that may result from electronic transmissions. I understand that the practice of medicine is not an exact science and that Practitioner makes no warranties or guarantees regarding treatment outcomes. I acknowledge that I will receive a copy of this consent concurrently upon execution via email to the email address I last provided but may also request a printed copy by calling the office of [facility].    I understand that my insurance will be billed for this visit.   I have read or had this consent read to me.  I understand the contents of this consent, which adequately explains the benefits and risks of the Services being provided via telemedicine.   I have been provided ample opportunity to ask  questions regarding this consent and the Services and have had my questions answered to my satisfaction.  I give my informed consent for the services to be provided through the use of telemedicine in my medical care  By participating in this telemedicine visit I agree to the above.

## 2018-11-11 ENCOUNTER — Ambulatory Visit (HOSPITAL_COMMUNITY): Payer: Medicare Other

## 2018-11-11 ENCOUNTER — Ambulatory Visit: Payer: Medicare Other | Admitting: Neurology

## 2018-11-15 ENCOUNTER — Telehealth (INDEPENDENT_AMBULATORY_CARE_PROVIDER_SITE_OTHER): Payer: Medicare Other | Admitting: Cardiology

## 2018-11-15 ENCOUNTER — Other Ambulatory Visit: Payer: Self-pay

## 2018-11-15 VITALS — BP 96/65 | HR 45 | Ht 70.0 in | Wt 190.0 lb

## 2018-11-15 DIAGNOSIS — Z952 Presence of prosthetic heart valve: Secondary | ICD-10-CM

## 2018-11-15 DIAGNOSIS — I6523 Occlusion and stenosis of bilateral carotid arteries: Secondary | ICD-10-CM

## 2018-11-15 DIAGNOSIS — Q211 Atrial septal defect: Secondary | ICD-10-CM | POA: Diagnosis not present

## 2018-11-15 DIAGNOSIS — E785 Hyperlipidemia, unspecified: Secondary | ICD-10-CM

## 2018-11-15 DIAGNOSIS — I2510 Atherosclerotic heart disease of native coronary artery without angina pectoris: Secondary | ICD-10-CM

## 2018-11-15 DIAGNOSIS — I420 Dilated cardiomyopathy: Secondary | ICD-10-CM

## 2018-11-15 DIAGNOSIS — Q2112 Patent foramen ovale: Secondary | ICD-10-CM

## 2018-11-15 DIAGNOSIS — I359 Nonrheumatic aortic valve disorder, unspecified: Secondary | ICD-10-CM | POA: Diagnosis not present

## 2018-11-15 NOTE — Patient Instructions (Signed)
Medication Instructions:  Stop: Carvedilol   If you need a refill on your cardiac medications before your next appointment, please call your pharmacy.   Lab work: None If you have labs (blood work) drawn today and your tests are completely normal, you will receive your results only by: [name] MyChart Message (if you have MyChart) OR . A paper copy in the mail If you have any lab test that is abnormal or we need to change your treatment, we will call you to review the results.  Testing/Procedures: Your physician has requested that you have an echocardiogram. Echocardiography is a painless test that uses sound waves to create images of your heart. It provides your doctor with information about the size and shape of your heart and how well your heart's chambers and valves are working. This procedure takes approximately one hour. There are no restrictions for this procedure.  Follow-Up: Schedule on 02/10/19 at 9:20 am.

## 2018-11-15 NOTE — Progress Notes (Signed)
Virtual Visit via Video Note   This visit type was conducted due to national recommendations for restrictions regarding the COVID-19 Pandemic (e.g. social distancing) in an effort to limit this patient's exposure and mitigate transmission in our community.  Due to his co-morbid illnesses, this patient is at least at moderate risk for complications without adequate follow up.  This format is felt to be most appropriate for this patient at this time.  All issues noted in this document were discussed and addressed.  A limited physical exam was performed with this format.  Please refer to the patient's chart for his consent to telehealth for [facility].  Evaluation Performed:  Follow-up visit  This visit type was conducted due to national recommendations for restrictions regarding the COVID-19 Pandemic (e.g. social distancing).  This format is felt to be most appropriate for this patient at this time.  All issues noted in this document were discussed and addressed.  No physical exam was performed (except for noted visual exam findings with Video Visits).  Please refer to the patient's chart (MyChart message for video visits and phone note for telephone visits) for the patient's consent to telehealth for [facility].  Date:  11/15/2018   ID:  Tracy Malta., DOB 1950-09-12, MRN [id number]  Patient Location:  Home  Provider location:   [place]   PCP:  Orpah Melter, MD  Cardiologist:  Fransico Him, MD  Electrophysiologist:  None   Chief Complaint:  PFO, ASCAD, AVR, thoracic aortic aneursym  History of Present Illness:    Tracy Besson. is a 68 y.o. male who presents via audio/video conferencing for a telehealth visit today.    Tracy Westall. is a 68 y.o. male with a hx of anemia, PFO closure, mechanical AVR on coumadin, thoracic aortic aneurysm, carotid artery disease, GERD, HLD, LBBB, prior CVA x 2, DVT presented on 2/5/2020with anterior STEMI and cath showed  thrombotic occlusion of the mid LAD and diag and underwent PTCA with no stent.  Trop peaked at >65.  It was felt that this was related to thrombotic event and not plaque rupture.  Echo showed EF 25-30% with AK of the distal septum and apex.  He was started on ACE I and BB but BP too soft for Entresto.  Discharged on ASA/Plavix and coumadin with plans to stop ASA after 1 month.  Lipitor was increased to 80mg  daily. He then underwent TEE outpt to rule out source of embolism which showed stable mechanical AVR with mild MR, moderate to severe LV dysfunction with EF 30-35%, mild to moderate MR and mildly dilated ascending aorta at 26mm.    He was seen back in structural heart clinic for PFO follow-up and was doing well.  A repeat echo was done which showed persistence of his LV dysfunction with EF 30 to 35%.  His ascending aorta was stable at 43 mm.  I did talk with Dr. Rayann Heman regarding possible ICD placement.  Unfortunately the patient has a significant hypercoagulable state and requires an INR of 3-3.5.  Dr. Rayann Heman felt that it was risky to put a device in someone with a history of thrombus formation below an INR 3 and if he put a ICD in above an INR of 3 to be increased risk of pocket bleed.  Therefore the plan right now was to hold tight since he is feeling well and continue to follow him and repeat an echo in July to see if LV function is approved  and further. HE is here today for followup and is doing well.  He denies any chest pain or pressure, SOB, DOE, PND, orthopnea, LE edema, dizziness, palpitations or syncope. He is compliant with his meds and is tolerating meds with no SE.     The patient does not have symptoms concerning for COVID-19 infection (fever, chills, cough, or new shortness of breath).    Prior CV studies:   The following studies were reviewed today:  2D echo  Past Medical History:  Diagnosis Date   Anemia    iron   Arthritis    Atypical moles    melanomna   Basal cell  carcinoma    Bilateral carotid artery stenosis 08/03/2014   1-39% right and 40-59% left carotid artery stenosis   CAD (coronary artery disease), native coronary artery 08/10/2018   S/P anterior STEMI secondary to thrombotic occlusion of the mid LAD and diag s/p PTCA with no stent 07/2018   Chronic anticoagulation    for mechanical AVR   Cluster headaches    CVA (cerebral vascular accident) ([place]) 07/24/2014    MRI indicating 2 punctate foci of acute infarction.  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    Diverticulosis    Dizziness    Episodic recurrent vertigo 2002 & 2005   carotid dopplers w no clinically significant stenosis   Fatty liver    hx of elevated hepatic transaminases-negative workup in 2009 except for U/S suggesting fatty liver   GERD (gastroesophageal reflux disease)    Gout    Heart murmur    Hyperlipidemia    Hyperlipidemia LDL goal <70 12/05/2015   Joint pain    LBBB (left bundle branch block)    Melanoma (HCC)    hx of melanoma and multiple basal cell carcinomas Dr Tonia Brooms   PFO (patent foramen ovale) 12/05/2015   Positive ANA (antinuclear antibody)    S/P AVR (aortic valve replacement)    mechanical   TIA (transient ischemic attack)    Past Surgical History:  Procedure Laterality Date   APPENDECTOMY     CARDIAC VALVE REPLACEMENT     mechanical   CHOLECYSTECTOMY     CORONARY ANGIOGRAPHY N/A 07/27/2018   Procedure: CORONARY ANGIOGRAPHY (CATH LAB);  Surgeon: Burnell Blanks, MD;  Location: [place] CV [facility];  Service: Cardiovascular;  Laterality: N/A;   CORONARY/GRAFT ACUTE MI REVASCULARIZATION N/A 07/27/2018   Procedure: CORONARY/GRAFT ACUTE MI REVASCULARIZATION;  Surgeon: Burnell Blanks, MD;  Location: [place] CV LAB;  Service: Cardiovascular;  Laterality: N/A;   DENTAL SURGERY Left bone graft and extraction   ESOPHAGOGASTRODUODENOSCOPY (EGD) WITH PROPOFOL  N/A 11/16/2017   Procedure: ESOPHAGOGASTRODUODENOSCOPY (EGD) WITH PROPOFOL;  Surgeon: Wonda Horner, MD;  Location: [facility];  Service: Endoscopy;  Laterality: N/A;   ESOPHAGOGASTRODUODENOSCOPY (EGD) WITH PROPOFOL N/A 11/25/2017   Procedure: ESOPHAGOGASTRODUODENOSCOPY (EGD) WITH PROPOFOL;  Surgeon: Wilford Corner, MD;  Location: [place];  Service: Endoscopy;  Laterality: N/A;   MELANOMA EXCISION     x3   PATENT FORAMEN OVALE(PFO) CLOSURE N/A 11/17/2017   Procedure: PATENT FORAMEN OVALE (PFO) CLOSURE;  Surgeon: Sherren Mocha, MD;  Location: [facility] CV [facility];  Service: Cardiovascular;  Laterality: N/A;   POLYPECTOMY  11/16/2017   Procedure: POLYPECTOMY;  Surgeon: Wonda Horner, MD;  Location: [facility];  Service: Endoscopy;;   SUBMUCOSAL INJECTION  11/25/2017   Procedure: SUBMUCOSAL INJECTION of epinephrine;  Surgeon: Wilford Corner, MD;  Location: [facility] [place];  Service: Endoscopy;;  TEE WITHOUT CARDIOVERSION N/A 07/31/2014   Procedure: TRANSESOPHAGEAL ECHOCARDIOGRAM (TEE);  Surgeon: Sueanne Margarita, MD;  Location: [facility] ENDOSCOPY;  Service: Cardiovascular;  Laterality: N/A;   TEE WITHOUT CARDIOVERSION N/A 10/01/2014   Procedure: TRANSESOPHAGEAL ECHOCARDIOGRAM (TEE);  Surgeon: Lelon Perla, MD;  Location: [facility] ENDOSCOPY;  Service: Cardiovascular;  Laterality: N/A;   TEE WITHOUT CARDIOVERSION N/A 08/05/2018   Procedure: TRANSESOPHAGEAL ECHOCARDIOGRAM (TEE);  Surgeon: Elouise Munroe, MD;  Location: [place];  Service: Cardiology;  Laterality: N/A;     Current Meds  Medication Sig   acetaminophen (TYLENOL) 500 MG tablet Take 1,000 mg by mouth every 6 (six) hours as needed for moderate pain.   atorvastatin (LIPITOR) 80 MG tablet Take 1 tablet (80 mg total) by mouth daily at 6 PM.   Carboxymethylcellul-Glycerin (LUBRICATING EYE DROPS OP) Place 1 drop into both eyes daily as needed (dry eyes).   carvedilol (COREG) 3.125 MG tablet Take 1 tablet (3.125 mg total) by  mouth at bedtime. (Patient taking differently: Take 3.125 mg by mouth 2 (two) times daily with a meal. )   clopidogrel (PLAVIX) 75 MG tablet Take 1 tablet (75 mg total) by mouth daily.   colchicine 0.6 MG tablet Take 0.6 mg by mouth daily as needed (gout flares).    diazepam (VALIUM) 2 MG tablet Take 4 mg by mouth at bedtime as needed for anxiety or sedation.    fenofibrate (TRICOR) 145 MG tablet Take 1 tablet (145 mg total) by mouth daily. (Patient taking differently: Take 145 mg by mouth every evening. )   ferrous sulfate 325 (65 FE) MG tablet Take 325 mg by mouth 2 (two) times daily with a meal.   fluticasone (FLONASE) 50 MCG/ACT nasal spray Place 1-2 sprays into both nostrils daily as needed for allergies.    gabapentin (NEURONTIN) 300 MG capsule Take 900 mg by mouth 3 (three) times daily.    lisinopril (PRINIVIL,ZESTRIL) 2.5 MG tablet Take 1 tablet (2.5 mg total) by mouth daily.   Multiple Vitamins-Minerals (ONE-A-DAY MENS 50+ ADVANTAGE) TABS Take 1 tablet by mouth daily.   nitroGLYCERIN (NITROSTAT) 0.4 MG SL tablet Place 1 tablet (0.4 mg total) under the tongue every 5 (five) minutes as needed for chest pain.   pantoprazole (PROTONIX) 40 MG tablet Take 40 mg by mouth daily.   ranitidine (ZANTAC) 75 MG tablet Take 75 mg by mouth 2 (two) times daily as needed for heartburn.   warfarin (COUMADIN) 3 MG tablet TAKE AS DIRECTED BY COUMADIN CLINIC (Patient taking differently: Take 1.5-3 mg by mouth See admin instructions. Take 1.5 mg in the evening on Mon and Fri, take 3 mg in the evening on Tues, Wed, Thur, Sat, and Sun)     Allergies:   Patient has no known allergies.   Social History   Tobacco Use   Smoking status: Never Smoker   Smokeless tobacco: Never Used  Substance Use Topics   Alcohol use: Yes    Alcohol/week: 0.0 standard drinks    Comment: moderate - 2-3 drinks about 3 times per week of wine, liquor, beer   Drug use: No     Family Hx: The patient's family  history includes Cancer in his father and mother; Melanoma in an other family member; Psoriasis in an other family member; Scoliosis in his sister; Valvular heart disease in his brother.  ROS:   Please see the history of present illness.     All other systems reviewed and are negative.   Labs/Other Tests and Data  Reviewed:    Recent Labs: 07/29/2018: Magnesium 1.9 08/12/2018: ALT 25; BUN 19; Creatinine 1.17; Hemoglobin 14.5; Platelets 336; Potassium 4.0; Sodium 139   Recent Lipid Panel Lab Results  Component Value Date/Time   CHOL 136 07/29/2018 03:22 AM   TRIG 150 (H) 07/29/2018 03:22 AM   HDL 41 07/29/2018 03:22 AM   CHOLHDL 3.3 07/29/2018 03:22 AM   LDLCALC 65 07/29/2018 03:22 AM    Wt Readings from Last 3 Encounters:  11/15/18 190 lb (86.2 kg)  11/04/18 190 lb (86.2 kg)  10/04/18 190 lb (86.2 kg)     Objective:    Vital Signs:  BP 96/65    Pulse (!) 45    Ht 5\' 10"  (1.778 m)    Wt 190 lb (86.2 kg)    BMI 27.26 kg/m    CONSTITUTIONAL:  Well nourished, well developed male in no acute distress.  EYES: anicteric MOUTH: oral mucosa is pink RESPIRATORY: Normal respiratory effort, symmetric expansion CARDIOVASCULAR: No peripheral edema SKIN: No rash, lesions or ulcers MUSCULOSKELETAL: no digital cyanosis NEURO: Cranial Nerves II-XII grossly intact, moves all extremities PSYCH: Intact judgement and insight.  A&O x 3, Mood/affect appropriate   ASSESSMENT & PLAN:    1.  H/O Mechanical AVR -his last 2D echo earlier this month showed stable AVR.  Will continue on Plavix and warfare.  2.  ASCAD - he is s/p NSTEMI in the setting of cardioembolic event/hypercoagulable state. This was a thrombotic occlusion of the mid LAD and diag and underwent PTCA with no stent.  Trop peaked at >65.  He has not had any chest pain or shortness of breath.  He will continue on Plavix 75 mg daily and warfarin.  3.  PFO -status post Amplatzer occluder device which is functioning normally on last  echo.  He will continue on warfarin and Plavix for his history of CVA in the setting of hypercoagulable state.  4.  Thoracic aortic aneurysm -this is stable by recent 2D echo at 43 mm.  His blood pressure is well controlled.  He will continue on statin therapy with atorvastatin 80 mg daily.  His LDL is at goal.  Ffollowed by Dr. Roxan Hockey with whom he has an appointment next month.  5.  Bilateral carotid stenosis - minimal plaque on dopplers 2019.   Continue on statin therapy.  6.  Hyperlipidemia -his LDL goal is less than 70.  His last LDL was 65 on 07/29/2018.  He will continue on atorvastatin 80 mg daily and TriCor 145 mg daily.  7.  Hypercoagulable state - followed by hematology.  He will continue on Coumadin therapy.  8.  Ischemic DCM -his EF has been 30 to 35% status post his thrombotic occlusion of the mid LAD.  He is on minimal heart failure therapy due to soft blood pressure and bradycardia.  He is having a lot of problems with getting dizzy when he stands up and feeling very sluggish and worn out.  His heart rate today is 45 bpm.  I recommended we stop carvedilol.  He will continue on lisinopril 2.5 mg daily.  His last creatinine was stable at 1.17.  I did discuss with EP in regards to device implantation since LV function has not improved in 2 months but given his need for high-dose warfare and he be at increased risk for pocket hematoma during the procedure and Dr. Rayann Heman felt it was too risky to hold or decrease Coumadin given that he has had an MI due to acute  thrombotic events on lower doses of Coumadin.  I will repeat echo in July.  9.  OVID-19 Education:The signs and symptoms of COVID-19 were discussed with the patient and how to seek care for testing (follow up with PCP or arrange E-visit).  The importance of social distancing was discussed today.  Patient Risk:   After full review of this patient's clinical status, I feel that they are at least moderate risk at this time.  Time:    Today, I have spent 15 minutes directly with the patient on video discussing medical problems including DCM, lipids, thoracic aorta, PFO, CAD.  We also reviewed the symptoms of COVID 19 and the ways to protect against contracting the virus with telehealth technology.  I spent an additional 5 minutes reviewing patient's chart including 2D echo.  Medication Adjustments/Labs and Tests Ordered: Current medicines are reviewed at length with the patient today.  Concerns regarding medicines are outlined above.  Tests Ordered: No orders of the defined types were placed in this encounter.  Medication Changes: No orders of the defined types were placed in this encounter.   Disposition:  Follow up in 3 month(s)  Signed, Fransico Him, MD  11/15/2018 1:06 PM    [facility]

## 2018-11-16 ENCOUNTER — Ambulatory Visit (HOSPITAL_COMMUNITY): Payer: Medicare Other

## 2018-11-18 ENCOUNTER — Ambulatory Visit (HOSPITAL_COMMUNITY): Payer: Medicare Other

## 2018-11-21 ENCOUNTER — Ambulatory Visit (HOSPITAL_COMMUNITY): Payer: Medicare Other

## 2018-11-21 ENCOUNTER — Telehealth: Payer: Self-pay

## 2018-11-21 NOTE — Telephone Encounter (Signed)

## 2018-11-23 ENCOUNTER — Ambulatory Visit (HOSPITAL_COMMUNITY): Payer: Medicare Other

## 2018-11-23 ENCOUNTER — Telehealth (HOSPITAL_COMMUNITY): Payer: Self-pay | Admitting: *Deleted

## 2018-11-23 NOTE — Telephone Encounter (Signed)
Pt called to inquire about participation in virtual cardiac rehab.  No answer. LMTCB.

## 2018-11-24 ENCOUNTER — Ambulatory Visit (INDEPENDENT_AMBULATORY_CARE_PROVIDER_SITE_OTHER): Payer: Medicare Other | Admitting: *Deleted

## 2018-11-24 ENCOUNTER — Telehealth (HOSPITAL_COMMUNITY): Payer: Self-pay

## 2018-11-24 ENCOUNTER — Other Ambulatory Visit: Payer: Self-pay

## 2018-11-24 ENCOUNTER — Encounter (HOSPITAL_COMMUNITY)
Admission: RE | Admit: 2018-11-24 | Discharge: 2018-11-24 | Disposition: A | Discharge status: Home / Self Care | Payer: Medicare Other | Source: Ambulatory Visit | Attending: Cardiology | Admitting: Cardiology

## 2018-11-24 DIAGNOSIS — Q2112 Patent foramen ovale: Secondary | ICD-10-CM

## 2018-11-24 DIAGNOSIS — Z952 Presence of prosthetic heart valve: Secondary | ICD-10-CM | POA: Diagnosis not present

## 2018-11-24 DIAGNOSIS — Z7901 Long term (current) use of anticoagulants: Secondary | ICD-10-CM

## 2018-11-24 DIAGNOSIS — Q211 Atrial septal defect: Secondary | ICD-10-CM

## 2018-11-24 LAB — POCT INR: INR: 2.7 (ref 2.0–3.0)

## 2018-11-24 NOTE — Progress Notes (Signed)
Pt is enrolled in virtual cardiac rehab program. Called to discuss heart healthy eating, desire for weight loss, and heart healthy snacking tips per pt request. No answer, left message, with contact information.   Laurina Bustle MS RD LDN 11/24/2018 2:10

## 2018-11-24 NOTE — Patient Instructions (Addendum)
Description   Take 1.5 tablets today and then continue taking 1 tablet daily except 1/2 tablet on Mondays, Wednesdays, and Fridays.  Recheck INR in 2 weeks.

## 2018-11-24 NOTE — Telephone Encounter (Signed)
Phone call made to PT to set up virtual cardiac rehab application. Pt was responsive and the app set was successful.

## 2018-11-24 NOTE — Telephone Encounter (Signed)
Called and spoke to pt regarding Virtual Cardiac Rehab.  Pt  was able to download the [employer] app on their smart device with no issues. Pt set up their account and received the following welcome message -"Welcome to the [facility] and Pulmonary Rehabilitation program. We hope that you will find the exercise program beneficial in your recovery process. Our staff is available to assist with any questions/concerns about your exercise routine. Best wishes". Brief orientation provided to with the advisement to watch the "Intro to Rehab" series located under the Resource tab. Pt verbalized understanding. Will continue to follow and monitor pt progress with feedback as needed.

## 2018-11-24 NOTE — Telephone Encounter (Signed)
         Confirm Consent - In the setting of the current Covid19 crisis, you are scheduled for a phone visit with your Cardiac or Pulmonary team member.  Just as we do with many in-gym visits, in order for you to participate in this visit, we must obtain consent.  If you'd like, I can send this to your mychart (if signed up) or email for you to review.  Otherwise, I can obtain your verbal consent now.  By agreeing to a telephone visit, we'd like you to understand that the technology does not allow for your Cardiac or Pulmonary Rehab team member to perform a physical assessment, and thus may limit their ability to fully assess your ability to perform exercise programs. If your provider identifies any concerns that need to be evaluated in person, we will make arrangements to do so.  Finally, though the technology is pretty good, we cannot assure that it will always work on either your or our end and we cannot ensure that we have a secure connection.  Cardiac and Pulmonary Rehab Telehealth visits and "At Home" cardiac and pulmonary rehab are provided at no cost to you.               Are you willing to proceed?" STAFF: Did the patient verbally acknowledge consent to telehealth visit? Document YES/NO here: YES      Deitra Mayo BS, ACSM CEP   Cardiac and Pulmonary Rehab Staff  11/24/2018 11:16 AM

## 2018-11-25 ENCOUNTER — Ambulatory Visit (HOSPITAL_COMMUNITY): Payer: Medicare Other

## 2018-11-28 ENCOUNTER — Ambulatory Visit (HOSPITAL_COMMUNITY): Payer: Medicare Other

## 2018-11-29 ENCOUNTER — Encounter (HOSPITAL_COMMUNITY)
Admission: RE | Admit: 2018-11-29 | Discharge: 2018-11-29 | Disposition: A | Discharge status: Home / Self Care | Payer: Medicare Other | Source: Ambulatory Visit | Attending: Cardiology | Admitting: Cardiology

## 2018-11-29 ENCOUNTER — Telehealth (HOSPITAL_COMMUNITY): Payer: Self-pay

## 2018-11-29 NOTE — Telephone Encounter (Signed)
Called pt to discuss heart healthy dietary pattern while participating in cardiac rehab. No answer. Left voicemail with contact information. Requested pt call RD back.   Laurina Bustle MS RD LDN 11/29/2018 9:51am

## 2018-11-29 NOTE — Telephone Encounter (Signed)
Called pt to discuss Heart Healthy eating and behaviors to engaging while he is participating in virtual cardiac rehab. Reviewed guidelines of heart healthy eating, label reading, how to build a healthy plate, snacks ideas, and eating on the go. Distributed educational handouts and encouraged pt to reach out with any additional questions  Spoke with patient 40 minutes today  Laurina Bustle MS RD LDN 11/29/18 2:16 pm

## 2018-11-30 ENCOUNTER — Encounter (HOSPITAL_COMMUNITY): Payer: Medicare Other

## 2018-11-30 ENCOUNTER — Other Ambulatory Visit (HOSPITAL_COMMUNITY): Payer: Medicare Other

## 2018-11-30 ENCOUNTER — Ambulatory Visit (HOSPITAL_COMMUNITY): Payer: Medicare Other

## 2018-11-30 ENCOUNTER — Other Ambulatory Visit: Payer: Self-pay

## 2018-12-02 ENCOUNTER — Ambulatory Visit (HOSPITAL_COMMUNITY): Payer: Medicare Other

## 2018-12-05 ENCOUNTER — Telehealth: Payer: Self-pay

## 2018-12-05 ENCOUNTER — Ambulatory Visit (HOSPITAL_COMMUNITY): Payer: Medicare Other

## 2018-12-05 NOTE — Telephone Encounter (Signed)

## 2018-12-05 NOTE — Telephone Encounter (Signed)
lmom for prescreen in office aware 

## 2018-12-07 ENCOUNTER — Ambulatory Visit (HOSPITAL_COMMUNITY): Payer: Medicare Other

## 2018-12-09 ENCOUNTER — Ambulatory Visit (HOSPITAL_COMMUNITY): Payer: Medicare Other

## 2018-12-09 ENCOUNTER — Ambulatory Visit (INDEPENDENT_AMBULATORY_CARE_PROVIDER_SITE_OTHER): Payer: Medicare Other | Admitting: *Deleted

## 2018-12-09 ENCOUNTER — Other Ambulatory Visit: Payer: Self-pay

## 2018-12-09 DIAGNOSIS — Z7901 Long term (current) use of anticoagulants: Secondary | ICD-10-CM

## 2018-12-09 DIAGNOSIS — Q211 Atrial septal defect: Secondary | ICD-10-CM

## 2018-12-09 DIAGNOSIS — Z952 Presence of prosthetic heart valve: Secondary | ICD-10-CM | POA: Diagnosis not present

## 2018-12-09 DIAGNOSIS — Q2112 Patent foramen ovale: Secondary | ICD-10-CM

## 2018-12-09 LAB — POCT INR: INR: 2.3 (ref 2.0–3.0)

## 2018-12-09 NOTE — Patient Instructions (Addendum)
Description   Take a whole tablet today and 1.5 tablets tomorrow, then increase dose to 1 tablet daily except for a half a tablet on Monday and Fridays. Recheck INR in 2-3 weeks.  Call [phone] if you have any changes in medications or upcoming procedures.

## 2018-12-12 ENCOUNTER — Ambulatory Visit (HOSPITAL_COMMUNITY): Payer: Medicare Other

## 2018-12-14 ENCOUNTER — Ambulatory Visit (HOSPITAL_COMMUNITY): Payer: Medicare Other

## 2018-12-15 DIAGNOSIS — G8929 Other chronic pain: Secondary | ICD-10-CM | POA: Diagnosis not present

## 2018-12-15 DIAGNOSIS — G894 Chronic pain syndrome: Secondary | ICD-10-CM | POA: Diagnosis not present

## 2018-12-15 DIAGNOSIS — M47814 Spondylosis without myelopathy or radiculopathy, thoracic region: Secondary | ICD-10-CM | POA: Diagnosis not present

## 2018-12-15 DIAGNOSIS — M546 Pain in thoracic spine: Secondary | ICD-10-CM | POA: Diagnosis not present

## 2018-12-16 ENCOUNTER — Ambulatory Visit (HOSPITAL_COMMUNITY): Payer: Medicare Other

## 2018-12-16 ENCOUNTER — Encounter (HOSPITAL_COMMUNITY)
Admission: RE | Admit: 2018-12-16 | Discharge: 2018-12-16 | Disposition: A | Discharge status: Home / Self Care | Payer: Self-pay | Source: Ambulatory Visit | Attending: Cardiology | Admitting: Cardiology

## 2018-12-16 ENCOUNTER — Telehealth (HOSPITAL_COMMUNITY): Payer: Self-pay | Admitting: *Deleted

## 2018-12-16 NOTE — Telephone Encounter (Signed)
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  Per previous communication with Dr. Radford Pax on 08/24/2018, when asked about lifting restriction for pt (he has 4.4cm TAA) Dr. 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  Pt does not recall any lifting restrictions and states that he does lift heavier objects like plywood and will ask for help if the items are heavier.  Pt does not report any dizziness or lightheadedness with SBP 80's-90's. He reports taking his medications as prescribed.  Requested pt to hold off on avoid lifting objects when volunteering until lifting restrictions are more concrete.  Pt verbalized understanding.  Will forward to Dr. Theodosia Blender nurse for review and f/u with patient.

## 2018-12-19 ENCOUNTER — Ambulatory Visit (HOSPITAL_COMMUNITY): Payer: Medicare Other

## 2018-12-19 NOTE — Telephone Encounter (Signed)
Dorothy Spark, MD  Jonael Paradiso, Mary Sella, RN; Sueanne Margarita, MD        I would continue weight lifting restriction of any objects over 20 pounds. For now I would continue his very low-dose of lisinopril 2.5 mg daily as his LVEF is decreased, however if his blood pressure remains in the 80-90s, especially if he is symptomatic, I would hold lisinopril.

## 2018-12-19 NOTE — Addendum Note (Signed)
Addended by: Sarina Ill on: 12/19/2018 04:57 PM   Modules accepted: Orders

## 2018-12-19 NOTE — Telephone Encounter (Signed)
Spoke with the patient, he expressed understanding about stopping lisinopril. He had no further questions.

## 2018-12-21 ENCOUNTER — Ambulatory Visit (HOSPITAL_COMMUNITY): Payer: Medicare Other

## 2018-12-21 ENCOUNTER — Telehealth: Payer: Self-pay

## 2018-12-21 NOTE — Telephone Encounter (Signed)
lmom for prescreen  

## 2018-12-22 ENCOUNTER — Other Ambulatory Visit: Payer: Medicare Other

## 2018-12-27 ENCOUNTER — Other Ambulatory Visit: Payer: Medicare Other

## 2018-12-27 ENCOUNTER — Ambulatory Visit: Payer: Medicare Other | Admitting: Thoracic Surgery (Cardiothoracic Vascular Surgery)

## 2018-12-28 ENCOUNTER — Other Ambulatory Visit: Payer: Self-pay

## 2018-12-28 ENCOUNTER — Ambulatory Visit (INDEPENDENT_AMBULATORY_CARE_PROVIDER_SITE_OTHER): Payer: Medicare Other | Admitting: *Deleted

## 2018-12-28 DIAGNOSIS — Z952 Presence of prosthetic heart valve: Secondary | ICD-10-CM | POA: Diagnosis not present

## 2018-12-28 DIAGNOSIS — Q211 Atrial septal defect: Secondary | ICD-10-CM | POA: Diagnosis not present

## 2018-12-28 DIAGNOSIS — Z7901 Long term (current) use of anticoagulants: Secondary | ICD-10-CM | POA: Diagnosis not present

## 2018-12-28 DIAGNOSIS — Q2112 Patent foramen ovale: Secondary | ICD-10-CM

## 2018-12-28 LAB — POCT INR: INR: 3.6 — AB (ref 2.0–3.0)

## 2018-12-28 NOTE — Patient Instructions (Signed)
Description   Today take 1/2 tablet then continue taking 1 tablet daily except for a 1/2 tablet on Monday and Fridays. Recheck INR in 2 weeks.  Call [phone] if you have any changes in medications or upcoming procedures.

## 2019-01-02 ENCOUNTER — Other Ambulatory Visit (HOSPITAL_COMMUNITY): Payer: Medicare Other

## 2019-01-02 DIAGNOSIS — G894 Chronic pain syndrome: Secondary | ICD-10-CM | POA: Diagnosis not present

## 2019-01-02 DIAGNOSIS — M47814 Spondylosis without myelopathy or radiculopathy, thoracic region: Secondary | ICD-10-CM | POA: Diagnosis not present

## 2019-01-02 DIAGNOSIS — I252 Old myocardial infarction: Secondary | ICD-10-CM | POA: Diagnosis not present

## 2019-01-02 DIAGNOSIS — Z8673 Personal history of transient ischemic attack (TIA), and cerebral infarction without residual deficits: Secondary | ICD-10-CM | POA: Diagnosis not present

## 2019-01-02 DIAGNOSIS — Z85820 Personal history of malignant melanoma of skin: Secondary | ICD-10-CM | POA: Diagnosis not present

## 2019-01-11 ENCOUNTER — Telehealth: Payer: Self-pay

## 2019-01-11 NOTE — Telephone Encounter (Signed)

## 2019-01-13 ENCOUNTER — Ambulatory Visit (HOSPITAL_COMMUNITY): Payer: Medicare Other | Attending: Cardiology

## 2019-01-13 ENCOUNTER — Ambulatory Visit (INDEPENDENT_AMBULATORY_CARE_PROVIDER_SITE_OTHER): Payer: Medicare Other | Admitting: *Deleted

## 2019-01-13 ENCOUNTER — Ambulatory Visit
Admission: RE | Admit: 2019-01-13 | Discharge: 2019-01-13 | Disposition: A | Discharge status: Home / Self Care | Payer: Medicare Other | Source: Ambulatory Visit | Attending: Thoracic Surgery (Cardiothoracic Vascular Surgery) | Admitting: Thoracic Surgery (Cardiothoracic Vascular Surgery)

## 2019-01-13 ENCOUNTER — Other Ambulatory Visit: Payer: Self-pay

## 2019-01-13 DIAGNOSIS — Z8774 Personal history of (corrected) congenital malformations of heart and circulatory system: Secondary | ICD-10-CM | POA: Insufficient documentation

## 2019-01-13 DIAGNOSIS — I712 Thoracic aortic aneurysm, without rupture, unspecified: Secondary | ICD-10-CM

## 2019-01-13 DIAGNOSIS — Q211 Atrial septal defect: Secondary | ICD-10-CM | POA: Diagnosis not present

## 2019-01-13 DIAGNOSIS — Z952 Presence of prosthetic heart valve: Secondary | ICD-10-CM

## 2019-01-13 DIAGNOSIS — Z7901 Long term (current) use of anticoagulants: Secondary | ICD-10-CM

## 2019-01-13 DIAGNOSIS — Q2112 Patent foramen ovale: Secondary | ICD-10-CM

## 2019-01-13 LAB — POCT INR: INR: 4.2 — AB (ref 2.0–3.0)

## 2019-01-13 MED ORDER — IOPAMIDOL (ISOVUE-370) INJECTION 76%
75.0000 mL | Freq: Once | INTRAVENOUS | Status: AC | PRN
  Administered 2019-01-13: 75 mL via INTRAVENOUS

## 2019-01-13 NOTE — Patient Instructions (Addendum)
  Description   Hold today, then continue taking 1 tablet daily except for a 1/2 tablet on Monday and Fridays. Recheck INR in 2 weeks.  Call [phone] if you have any changes in medications or upcoming procedures.

## 2019-01-16 ENCOUNTER — Other Ambulatory Visit: Payer: Self-pay | Admitting: *Deleted

## 2019-01-16 DIAGNOSIS — I359 Nonrheumatic aortic valve disorder, unspecified: Secondary | ICD-10-CM

## 2019-01-16 DIAGNOSIS — Z952 Presence of prosthetic heart valve: Secondary | ICD-10-CM

## 2019-01-17 ENCOUNTER — Encounter: Payer: Self-pay | Admitting: Thoracic Surgery (Cardiothoracic Vascular Surgery)

## 2019-01-17 ENCOUNTER — Ambulatory Visit (INDEPENDENT_AMBULATORY_CARE_PROVIDER_SITE_OTHER): Payer: Medicare Other | Admitting: Thoracic Surgery (Cardiothoracic Vascular Surgery)

## 2019-01-17 ENCOUNTER — Other Ambulatory Visit: Payer: Self-pay

## 2019-01-17 ENCOUNTER — Ambulatory Visit: Payer: Medicare Other | Admitting: Thoracic Surgery (Cardiothoracic Vascular Surgery)

## 2019-01-17 VITALS — BP 112/73 | HR 55 | Temp 97.8°F | Resp 16 | Ht 70.0 in | Wt 180.0 lb

## 2019-01-17 DIAGNOSIS — I712 Thoracic aortic aneurysm, without rupture, unspecified: Secondary | ICD-10-CM

## 2019-01-17 DIAGNOSIS — I255 Ischemic cardiomyopathy: Secondary | ICD-10-CM

## 2019-01-17 DIAGNOSIS — R911 Solitary pulmonary nodule: Secondary | ICD-10-CM

## 2019-01-17 NOTE — Progress Notes (Signed)
[address]       [place] [address]             [phone]     HPI: [name] returns for a scheduled follow-up visit  Tracy Marshall is a 68 year old man with a history of mechanical AVR in 2001, patent foramen ovale, stroke, gastric polypectomy, gastrointestinal bleed, CAD, MI, as well as numerous other problems.  He had a aortic valve replacement with mechanical valve in 2001 in [place].  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  It was measured at 4.5 cm although I felt it was actually smaller than that.  I been following him on an annual basis since then.  I last saw him in June 2019.  The aneurysm was read as 4.4 cm compared to 4.2 cm previously.  I did not think there was any significant difference in the scan.  In the interim since his last visit he had an MI in February.  They felt that was a clotting issue rather than coronary atherosclerosis.  He did not require any percutaneous intervention.  He has been feeling well since then.  He recently had an echocardiogram which showed some improvement in his left ventricular function.  He denies any chest pain, pressure, tightness, or shortness of breath.  He does not have orthopnea or peripheral edema.  Past Medical History:  Diagnosis Date  . Anemia    iron  . Arthritis   . Atypical moles    melanomna  . Basal cell carcinoma   . Bilateral carotid artery stenosis 08/03/2014   1-39% right and 40-59% left carotid artery stenosis  . CAD (coronary artery disease), native coronary artery 08/10/2018   S/P anterior STEMI secondary to thrombotic occlusion of the mid LAD and diag s/p PTCA with no stent 07/2018  . Chronic anticoagulation    for mechanical AVR  . Cluster headaches   . CVA (cerebral vascular accident) ([place]) 07/24/2014    MRI indicating 2 punctate foci of acute infarction.  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   .  Diverticulosis   . Dizziness   . Episodic recurrent vertigo 2002 & 2005   carotid dopplers w no clinically significant stenosis  . Fatty liver    hx of elevated hepatic transaminases-negative workup in 2009 except for U/S suggesting fatty liver  . GERD (gastroesophageal reflux disease)   . Gout   . Heart murmur   . Hyperlipidemia   . Hyperlipidemia LDL goal <70 12/05/2015  . Joint pain   . LBBB (left bundle branch block)   . Melanoma ([place])    hx of melanoma and multiple basal cell carcinomas Dr Tonia Brooms  . PFO (patent foramen ovale) 12/05/2015  . Positive ANA (antinuclear antibody)   . S/P AVR (aortic valve replacement)    mechanical  . TIA (transient ischemic attack)     Current Outpatient Medications  Medication Sig Dispense Refill  . acetaminophen (TYLENOL) 500 MG tablet Take 1,000 mg by mouth every 6 (six) hours as needed for moderate pain.    [name] atorvastatin (LIPITOR) 80 MG tablet Take 1 tablet (80 mg total) by mouth daily at 6 PM. 30 tablet 6  . Carboxymethylcellul-Glycerin (LUBRICATING EYE DROPS OP) Place 1 drop into both eyes daily as needed (dry eyes).    . clopidogrel (PLAVIX) 75 MG tablet Take 1 tablet (75 mg total) by mouth daily. [place]  tablet 11  . colchicine 0.6 MG tablet Take 0.6 mg by mouth daily as needed (gout flares).     . diazepam (VALIUM) 2 MG tablet Take 4 mg by mouth at bedtime as needed for anxiety or sedation.     . fenofibrate (TRICOR) 145 MG tablet Take 1 tablet (145 mg total) by mouth daily. (Patient taking differently: Take 145 mg by mouth every evening. ) 90 tablet 3  . ferrous sulfate 325 (65 FE) MG tablet Take 325 mg by mouth 2 (two) times daily with a meal.    . fluticasone (FLONASE) 50 MCG/ACT nasal spray Place 1-2 sprays into both nostrils daily as needed for allergies.     . Multiple Vitamins-Minerals (ONE-A-DAY MENS 50+ ADVANTAGE) TABS Take 1 tablet by mouth daily.    . nitroGLYCERIN (NITROSTAT) 0.4 MG SL tablet Place 1 tablet (0.4 mg total) under the  tongue every 5 (five) minutes as needed for chest pain. 15 tablet 3  . pantoprazole (PROTONIX) 40 MG tablet Take 40 mg by mouth daily.    . ranitidine (ZANTAC) 75 MG tablet Take 75 mg by mouth 2 (two) times daily as needed for heartburn.    . warfarin (COUMADIN) 3 MG tablet TAKE AS DIRECTED BY COUMADIN CLINIC (Patient taking differently: Take 1.5-3 mg by mouth See admin instructions. Take 1.5 mg in the evening on Mon and Fri, take 3 mg in the evening on Tues, Wed, Thur, Sat, and Sun) 30 tablet 3   No current facility-administered medications for this visit.     Physical Exam BP 112/73 (BP Location: Right Arm, Patient Position: Sitting, Cuff Size: Normal)   Pulse (!) 55   Temp 97.8 F (36.6 C) Comment: thermal  Resp 16   Ht 5\' 10"  (1.778 m)   Wt 180 lb (81.6 kg)   SpO2 95% Comment: RA  BMI 25.21 kg/m  68 year old man in no acute distress Alert and oriented x3 with no focal deficits Lungs clear with equal breath sounds bilaterally Cardiac regular rate and rhythm with faint murmur No peripheral edema No carotid bruits  Diagnostic Tests: CT ANGIOGRAPHY CHEST WITH CONTRAST  TECHNIQUE: Multidetector CT imaging of the chest was performed using the standard protocol during bolus administration of intravenous contrast. Multiplanar CT image reconstructions and MIPs were obtained to evaluate the vascular anatomy.  CONTRAST:  43mL ISOVUE-370 IOPAMIDOL (ISOVUE-370) INJECTION 76%  COMPARISON:  CT scan of December 14, 2017.  FINDINGS: Cardiovascular: Status post aortic valve replacement. There is no evidence of thoracic aortic dissection. Normal cardiac size. No pericardial effusion. Great vessels are widely patent without significant stenosis. 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 Transverse aortic arch measures 3 cm. Proximal descending thoracic aorta measures 2.5 cm.  Mediastinum/Nodes: No enlarged mediastinal, hilar, or axillary lymph nodes. Thyroid gland, trachea,  and esophagus demonstrate no significant findings.  Lungs/Pleura: No pneumothorax or pleural effusion is noted. Stable ill-defined ground-glass opacity is seen in inferior portion of right middle lobe. No other pulmonary abnormality is noted.  Upper Abdomen: No acute abnormality.  Musculoskeletal: No chest wall abnormality. No acute or significant osseous findings.  Review of the MIP images confirms the above findings.  IMPRESSION: 4.2 cm ascending thoracic aortic aneurysm is noted which is not significantly changed compared to prior exam. Recommend annual imaging followup by CTA or MRA. This recommendation follows 2010 ACCF/AHA/AATS/ACR/ASA/SCA/SCAI/SIR/STS/SVM Guidelines for the Diagnosis and Management of Patients with Thoracic Aortic Disease. Circulation. 2010; 121: C381-M403. Aortic aneurysm NOS (ICD10-I71.9).  Stable ill-defined ground-glass opacity  is noted in inferior portion of right middle lobe. Continued attention to this area on follow-up imaging is recommended.  Aortic aneurysm NOS (ICD10-I71.9).   Electronically Signed   By: Tracy Marshall M.D.   On: 01/13/2019 11:39 I personally reviewed the CT images and concur with the findings noted above  Impression: Tracy Marshall is a 68 year old man with complicated past medical history including mechanical AVR in 2001, patent foramen ovale, Amplatz occluder of PFO, stroke, CAD, MI, as well as numerous other problems.  He was found to have an ascending aneurysm back in 2017.  It has been relatively stable around 4.2 cm since that time.  He needs continued annual follow-up.  His blood pressure is well controlled currently.  He had an MI back in February.  He was on Coumadin.  Plavix has been added to that regimen.  No recent symptoms.  Recent echo showed ejection fraction of 35 to 40%.  He is scheduled to have another echocardiogram with contrast in the near future.  Plan: Return in 1 year with CT angiogram of  chest  Tracy Nakayama, MD [facility] [phone]

## 2019-01-26 ENCOUNTER — Other Ambulatory Visit: Payer: Self-pay

## 2019-01-26 ENCOUNTER — Ambulatory Visit (HOSPITAL_COMMUNITY): Payer: Medicare Other | Attending: Cardiology

## 2019-01-26 DIAGNOSIS — Z952 Presence of prosthetic heart valve: Secondary | ICD-10-CM

## 2019-01-26 DIAGNOSIS — I359 Nonrheumatic aortic valve disorder, unspecified: Secondary | ICD-10-CM | POA: Insufficient documentation

## 2019-01-26 MED ORDER — PERFLUTREN LIPID MICROSPHERE
1.0000 mL | INTRAVENOUS | Status: AC | PRN
  Administered 2019-01-26: 2 mL via INTRAVENOUS

## 2019-01-27 ENCOUNTER — Other Ambulatory Visit: Payer: Self-pay | Admitting: *Deleted

## 2019-01-27 ENCOUNTER — Ambulatory Visit (INDEPENDENT_AMBULATORY_CARE_PROVIDER_SITE_OTHER): Payer: Medicare Other | Admitting: Pharmacist

## 2019-01-27 ENCOUNTER — Other Ambulatory Visit: Payer: Self-pay

## 2019-01-27 DIAGNOSIS — Z952 Presence of prosthetic heart valve: Secondary | ICD-10-CM | POA: Diagnosis not present

## 2019-01-27 DIAGNOSIS — I77810 Thoracic aortic ectasia: Secondary | ICD-10-CM

## 2019-01-27 DIAGNOSIS — Z7901 Long term (current) use of anticoagulants: Secondary | ICD-10-CM

## 2019-01-27 DIAGNOSIS — Q211 Atrial septal defect: Secondary | ICD-10-CM | POA: Diagnosis not present

## 2019-01-27 DIAGNOSIS — Q2112 Patent foramen ovale: Secondary | ICD-10-CM

## 2019-01-27 LAB — PROTIME-INR
INR: 9.6 (ref 0.8–1.2)
Prothrombin Time: 101.7 s — ABNORMAL HIGH (ref 9.1–12.0)

## 2019-01-27 LAB — POCT INR: INR: 7.2 — AB (ref 2.0–3.0)

## 2019-01-27 NOTE — Patient Instructions (Signed)
Description   Do not take any Coumadin until we call you. Go to the ER with any bleeding.     

## 2019-02-01 ENCOUNTER — Other Ambulatory Visit: Payer: Self-pay

## 2019-02-01 ENCOUNTER — Ambulatory Visit (INDEPENDENT_AMBULATORY_CARE_PROVIDER_SITE_OTHER): Payer: Medicare Other

## 2019-02-01 DIAGNOSIS — Z7901 Long term (current) use of anticoagulants: Secondary | ICD-10-CM

## 2019-02-01 DIAGNOSIS — Z952 Presence of prosthetic heart valve: Secondary | ICD-10-CM | POA: Diagnosis not present

## 2019-02-01 DIAGNOSIS — Q211 Atrial septal defect: Secondary | ICD-10-CM

## 2019-02-01 DIAGNOSIS — Q2112 Patent foramen ovale: Secondary | ICD-10-CM

## 2019-02-01 LAB — POCT INR: INR: 1.6 — AB (ref 2.0–3.0)

## 2019-02-01 MED ORDER — ENOXAPARIN SODIUM 80 MG/0.8ML ~~LOC~~ SOLN: 80.0000 mg | Freq: Two times a day (BID) | SUBCUTANEOUS | 0 refills | Status: DC

## 2019-02-01 NOTE — Patient Instructions (Signed)
Description    Take 1.5 tablets x 3 days, then start taking 1 tablet daily except 1/2 tablet on Mondays, Wednesdays and Fridays.  Recheck in 1 week.

## 2019-02-01 NOTE — Progress Notes (Signed)
Patient nervous about being low but also concerned about his INR fluctuations. Patient denies changes in diet. Says he stopped taking multi vitamin. But this only had 35mcg, so I don't feel this contributed much to his INR of 9.6. Says he was on prednisone but this was several weeks ago. Didn't know the dose.

## 2019-02-02 MED ORDER — PERFLUTREN LIPID MICROSPHERE: 1.0000 mL | INTRAVENOUS | Status: AC | PRN

## 2019-02-06 ENCOUNTER — Ambulatory Visit (INDEPENDENT_AMBULATORY_CARE_PROVIDER_SITE_OTHER): Payer: Medicare Other | Admitting: *Deleted

## 2019-02-06 ENCOUNTER — Other Ambulatory Visit: Payer: Self-pay

## 2019-02-06 DIAGNOSIS — Q211 Atrial septal defect: Secondary | ICD-10-CM

## 2019-02-06 DIAGNOSIS — Z952 Presence of prosthetic heart valve: Secondary | ICD-10-CM

## 2019-02-06 DIAGNOSIS — Q2112 Patent foramen ovale: Secondary | ICD-10-CM

## 2019-02-06 DIAGNOSIS — Z7901 Long term (current) use of anticoagulants: Secondary | ICD-10-CM | POA: Diagnosis not present

## 2019-02-06 LAB — POCT INR: INR: 4.2 — AB (ref 2.0–3.0)

## 2019-02-06 NOTE — Patient Instructions (Addendum)
  Description   Hold coumadin today, then continue taking 1 tablet daily except for 1/2 a tablet on Monday, Wednesday and Friday. Recheck INR in 1 week. Call [phone] for any medication changes or upcoming procedures. Stop taking Lovenox.

## 2019-02-09 ENCOUNTER — Telehealth: Payer: Self-pay

## 2019-02-09 NOTE — Telephone Encounter (Signed)
LM for pt to call back. Appt with TT on 8/21 needs to be moved to 8/25 and switched to a VV. I will go over these instructions with the pt when he calls back.

## 2019-02-10 ENCOUNTER — Ambulatory Visit: Payer: Medicare Other | Admitting: Cardiology

## 2019-02-13 ENCOUNTER — Ambulatory Visit (INDEPENDENT_AMBULATORY_CARE_PROVIDER_SITE_OTHER): Payer: Medicare Other | Admitting: *Deleted

## 2019-02-13 ENCOUNTER — Other Ambulatory Visit: Payer: Self-pay | Admitting: *Deleted

## 2019-02-13 ENCOUNTER — Other Ambulatory Visit: Payer: Self-pay

## 2019-02-13 DIAGNOSIS — Z7901 Long term (current) use of anticoagulants: Secondary | ICD-10-CM | POA: Diagnosis not present

## 2019-02-13 DIAGNOSIS — Q211 Atrial septal defect: Secondary | ICD-10-CM

## 2019-02-13 DIAGNOSIS — I639 Cerebral infarction, unspecified: Secondary | ICD-10-CM

## 2019-02-13 DIAGNOSIS — Q2112 Patent foramen ovale: Secondary | ICD-10-CM

## 2019-02-13 DIAGNOSIS — Z952 Presence of prosthetic heart valve: Secondary | ICD-10-CM

## 2019-02-13 LAB — POCT INR: INR: 3.4 — AB (ref 2.0–3.0)

## 2019-02-13 NOTE — Patient Instructions (Addendum)
Description   Continue taking 1 tablet daily except for 1/2 a tablet on Monday, Wednesday and Friday. Recheck INR in 2 weeks. Call [phone] for any medication changes or upcoming procedures.

## 2019-02-14 ENCOUNTER — Telehealth (INDEPENDENT_AMBULATORY_CARE_PROVIDER_SITE_OTHER): Payer: Medicare Other | Admitting: Cardiology

## 2019-02-14 VITALS — BP 109/77 | HR 62 | Ht 70.0 in | Wt 182.0 lb

## 2019-02-14 DIAGNOSIS — I359 Nonrheumatic aortic valve disorder, unspecified: Secondary | ICD-10-CM | POA: Diagnosis not present

## 2019-02-14 DIAGNOSIS — Z952 Presence of prosthetic heart valve: Secondary | ICD-10-CM | POA: Diagnosis not present

## 2019-02-14 DIAGNOSIS — I712 Thoracic aortic aneurysm, without rupture, unspecified: Secondary | ICD-10-CM

## 2019-02-14 DIAGNOSIS — I2510 Atherosclerotic heart disease of native coronary artery without angina pectoris: Secondary | ICD-10-CM

## 2019-02-14 DIAGNOSIS — Q211 Atrial septal defect: Secondary | ICD-10-CM | POA: Diagnosis not present

## 2019-02-14 DIAGNOSIS — I6523 Occlusion and stenosis of bilateral carotid arteries: Secondary | ICD-10-CM | POA: Diagnosis not present

## 2019-02-14 DIAGNOSIS — Z79899 Other long term (current) drug therapy: Secondary | ICD-10-CM

## 2019-02-14 DIAGNOSIS — E785 Hyperlipidemia, unspecified: Secondary | ICD-10-CM

## 2019-02-14 DIAGNOSIS — Q2112 Patent foramen ovale: Secondary | ICD-10-CM

## 2019-02-14 NOTE — Progress Notes (Signed)
Virtual Visit via Video Note   This visit type was conducted due to national recommendations for restrictions regarding the COVID-19 Pandemic (e.g. social distancing) in an effort to limit this patient's exposure and mitigate transmission in our community.  Due to his co-morbid illnesses, this patient is at least at moderate risk for complications without adequate follow up.  This format is felt to be most appropriate for this patient at this time.  All issues noted in this document were discussed and addressed.  A limited physical exam was performed with this format.  Please refer to the patient's chart for his consent to telehealth for [facility].   Evaluation Performed:  Follow-up visit  This visit type was conducted due to national recommendations for restrictions regarding the COVID-19 Pandemic (e.g. social distancing).  This format is felt to be most appropriate for this patient at this time.  All issues noted in this document were discussed and addressed.  No physical exam was performed (except for noted visual exam findings with Video Visits).  Please refer to the patient's chart (MyChart message for video visits and phone note for telephone visits) for the patient's consent to telehealth for [facility].  Date:  02/14/2019   ID:  [name], DOB 1951-01-01, MRN [id number]  Patient Location:  Home  Provider location:   [place]  PCP:  Orpah Melter, MD  Cardiologist:  Fransico Him, MD  Electrophysiologist:  None   Chief Complaint:  PFO, CAD, AVR, thoracic aortic aneurysm  History of Present Illness:    Tracy Marshall. is a 68 y.o. male who presents via audio/video conferencing for a telehealth visit today.    Tracy Marshallis a 68 y.o.malewith a hx of anemia, PFO closure, mechanical AVR on coumadin, thoracic aortic aneurysm, carotid artery disease, GERD, HLD, LBBB, prior CVA x 2, DVT presented on 2/5/2020with anterior STEMI and cath showed  thrombotic occlusion of the mid LAD and diag and underwent PTCA with no stent. Trop peaked at >65. It was felt that this was related to thrombotic event and not plaque rupture. Echo showed EF 25-30% with AK of the distal septum and apex. He was started on ACE I and BB but BP too soft for Entresto. Discharged on ASA/Plavix and coumadin with plans to stop ASA after 1 month. Lipitor was increased to 80mg  daily. He then underwent TEE outpt to rule out source of embolism which showed stable mechanical AVR with mild MR, moderate to severe LV dysfunction with EF 30-35%, mild to moderate MR and mildly dilated ascending aorta at 41mm.   He was seen back in structural heart clinic for PFO follow-up and was doing well.  A repeat echo was done which showed persistence of his LV dysfunction with EF 30 to 35%.  His ascending aorta was stable at 43 mm.  I did talk with Dr. Rayann Heman regarding possible ICD placement.  Unfortunately the patient has a significant hypercoagulable state and requires an INR of 3-3.5.  Dr. Rayann Heman felt that it was risky to put a device in someone with a history of thrombus formation below an INR 3 and if he put a ICD in above an INR of 3 to be increased risk of pocket bleed.  Therefore the plan right now was to hold tight since he is feeling well and continue to follow him and repeat an echo in July to see if LV function is approved and further.Tracy Marshallis a 68 y.o.malewith a hx  of anemia, PFO closure, mechanical AVR on coumadin, thoracic aortic aneurysm, carotid artery disease, GERD, HLD, LBBB, prior CVA x 2, DVT presented on 2/5/2020with anterior STEMI and cath showed thrombotic occlusion of the mid LAD and diag and underwent PTCA with no stent. Trop peaked at >65. It was felt that this was related to thrombotic event and not plaque rupture. Echo showed EF 25-30% with AK of the distal septum and apex. He was started on ACE I and BB but BP too soft for Entresto. Discharged on  ASA/Plavix and coumadin with plans to stop ASA after 1 month. Lipitor was increased to 80mg  daily. He then underwent TEE outpt to rule out source of embolism which showed stable mechanical AVR with mild MR, moderate to severe LV dysfunction with EF 30-35%, mild to moderate MR and mildly dilated ascending aorta at 40mm.   He was seen back in structural heart clinic for PFO follow-up and was doing well.  A repeat echo was done which showed persistence of his LV dysfunction with EF 30 to 35%.  His ascending aorta was stable at 43 mm.  I did talk with Dr. Rayann Heman regarding possible ICD placement.  Unfortunately the patient has a significant hypercoagulable state and requires an INR of 3-3.5.  Dr. Rayann Heman felt that it was risky to put a device in someone with a history of thrombus formation below an INR 3 and if he put a ICD in above an INR of 3 to be increased risk of pocket bleed.  Therefore the plan right now was to hold tight since he is feeling well and continue to follow him and repeat an echo in July.  Repeat echo in July showed an EF of 35-40% and repeat limited echo in August showed 30-35%.  He is here today for followup and is doing well.  He denies any chest pain or pressure, SOB, DOE, PND, orthopnea, LE edema, dizziness, palpitations or syncope. He is compliant with his meds and is tolerating meds with no SE.  He is running and jogging daily for 1-2 miles.    The patient does not have symptoms concerning for COVID-19 infection (fever, chills, cough, or new shortness of breath).   Prior CV studies:   The following studies were reviewed today:  2D echo  Past Medical History:  Diagnosis Date   Anemia    iron   Arthritis    Atypical moles    melanomna   Basal cell carcinoma    Bilateral carotid artery stenosis 08/03/2014   1-39% right and 40-59% left carotid artery stenosis   CAD (coronary artery disease), native coronary artery 08/10/2018   S/P anterior STEMI secondary to thrombotic  occlusion of the mid LAD and diag s/p PTCA with no stent 07/2018   Chronic anticoagulation    for mechanical AVR   Cluster headaches    CVA (cerebral vascular accident) ([place]) 07/24/2014    MRI indicating 2 punctate foci of acute infarction.  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    Diverticulosis    Dizziness    Episodic recurrent vertigo 2002 & 2005   carotid dopplers w no clinically significant stenosis   Fatty liver    hx of elevated hepatic transaminases-negative workup in 2009 except for U/S suggesting fatty liver   GERD (gastroesophageal reflux disease)    Gout    Heart murmur    Hyperlipidemia    Hyperlipidemia LDL goal <70 12/05/2015   Joint pain  LBBB (left bundle branch block)    Melanoma (HCC)    hx of melanoma and multiple basal cell carcinomas Dr Tonia Brooms   PFO (patent foramen ovale) 12/05/2015   Positive ANA (antinuclear antibody)    S/P AVR (aortic valve replacement)    mechanical   TIA (transient ischemic attack)    Past Surgical History:  Procedure Laterality Date   APPENDECTOMY     CARDIAC VALVE REPLACEMENT     mechanical   CHOLECYSTECTOMY     CORONARY ANGIOGRAPHY N/A 07/27/2018   Procedure: CORONARY ANGIOGRAPHY (CATH LAB);  Surgeon: Burnell Blanks, MD;  Location: [place] CV LAB;  Service: Cardiovascular;  Laterality: N/A;   CORONARY/GRAFT ACUTE MI REVASCULARIZATION N/A 07/27/2018   Procedure: CORONARY/GRAFT ACUTE MI REVASCULARIZATION;  Surgeon: Burnell Blanks, MD;  Location: [place] CV LAB;  Service: Cardiovascular;  Laterality: N/A;   DENTAL SURGERY Left bone graft and extraction   ESOPHAGOGASTRODUODENOSCOPY (EGD) WITH PROPOFOL N/A 11/16/2017   Procedure: ESOPHAGOGASTRODUODENOSCOPY (EGD) WITH PROPOFOL;  Surgeon: Wonda Horner, MD;  Location: [facility] ENDOSCOPY;  Service: Endoscopy;  Laterality: N/A;   ESOPHAGOGASTRODUODENOSCOPY (EGD) WITH PROPOFOL N/A  11/25/2017   Procedure: ESOPHAGOGASTRODUODENOSCOPY (EGD) WITH PROPOFOL;  Surgeon: Wilford Corner, MD;  Location: [place];  Service: Endoscopy;  Laterality: N/A;   MELANOMA EXCISION     x3   PATENT FORAMEN OVALE(PFO) CLOSURE N/A 11/17/2017   Procedure: PATENT FORAMEN OVALE (PFO) CLOSURE;  Surgeon: Sherren Mocha, MD;  Location: [place] CV LAB;  Service: Cardiovascular;  Laterality: N/A;   POLYPECTOMY  11/16/2017   Procedure: POLYPECTOMY;  Surgeon: Wonda Horner, MD;  Location: [facility] ENDOSCOPY;  Service: Endoscopy;;   SUBMUCOSAL INJECTION  11/25/2017   Procedure: SUBMUCOSAL INJECTION of epinephrine;  Surgeon: Wilford Corner, MD;  Location: [place];  Service: Endoscopy;;   TEE WITHOUT CARDIOVERSION N/A 07/31/2014   Procedure: TRANSESOPHAGEAL ECHOCARDIOGRAM (TEE);  Surgeon: Sueanne Margarita, MD;  Location: [facility] ENDOSCOPY;  Service: Cardiovascular;  Laterality: N/A;   TEE WITHOUT CARDIOVERSION N/A 10/01/2014   Procedure: TRANSESOPHAGEAL ECHOCARDIOGRAM (TEE);  Surgeon: Lelon Perla, MD;  Location: [facility] ENDOSCOPY;  Service: Cardiovascular;  Laterality: N/A;   TEE WITHOUT CARDIOVERSION N/A 08/05/2018   Procedure: TRANSESOPHAGEAL ECHOCARDIOGRAM (TEE);  Surgeon: Elouise Munroe, MD;  Location: [place];  Service: Cardiology;  Laterality: N/A;     Current Meds  Medication Sig   acetaminophen (TYLENOL) 500 MG tablet Take 1,000 mg by mouth every 6 (six) hours as needed for moderate pain.   atorvastatin (LIPITOR) 80 MG tablet Take 1 tablet (80 mg total) by mouth daily at 6 PM.   Carboxymethylcellul-Glycerin (LUBRICATING EYE DROPS OP) Place 1 drop into both eyes daily as needed (dry eyes).   clopidogrel (PLAVIX) 75 MG tablet Take 1 tablet (75 mg total) by mouth daily.   colchicine 0.6 MG tablet Take 0.6 mg by mouth daily as needed (gout flares).    diazepam (VALIUM) 2 MG tablet Take 4 mg by mouth at bedtime as needed for anxiety or sedation.    fenofibrate (TRICOR) 145 MG  tablet Take 1 tablet (145 mg total) by mouth daily. (Patient taking differently: Take 145 mg by mouth every evening. )   ferrous sulfate 325 (65 FE) MG tablet Take 325 mg by mouth 2 (two) times daily with a meal.   fluticasone (FLONASE) 50 MCG/ACT nasal spray Place 1-2 sprays into both nostrils daily as needed for allergies.    gabapentin (NEURONTIN) 300 MG capsule Take 900 mg by mouth 3 (three) times  daily.   nitroGLYCERIN (NITROSTAT) 0.4 MG SL tablet Place 1 tablet (0.4 mg total) under the tongue every 5 (five) minutes as needed for chest pain.   pantoprazole (PROTONIX) 40 MG tablet Take 40 mg by mouth daily.   ranitidine (ZANTAC) 75 MG tablet Take 75 mg by mouth 2 (two) times daily as needed for heartburn.   warfarin (COUMADIN) 3 MG tablet TAKE AS DIRECTED BY COUMADIN CLINIC (Patient taking differently: Take 1.5-3 mg by mouth See admin instructions. Take 1.5 mg in the evening on Mon and Fri, take 3 mg in the evening on Tues, Wed, Thur, Sat, and Sun)     Allergies:   Patient has no known allergies.   Social History   Tobacco Use   Smoking status: Never Smoker   Smokeless tobacco: Never Used  Substance Use Topics   Alcohol use: Yes    Alcohol/week: 0.0 standard drinks    Comment: moderate - 2-3 drinks about 3 times per week of wine, liquor, beer   Drug use: No     Family Hx: The patient's family history includes Cancer in his father and mother; Melanoma in an other family member; Psoriasis in an other family member; Scoliosis in his sister; Valvular heart disease in his brother.  ROS:   Please see the history of present illness.     All other systems reviewed and are negative.   Labs/Other Tests and Data Reviewed:    Recent Labs: 07/29/2018: Magnesium 1.9 08/12/2018: ALT 25; BUN 19; Creatinine 1.17; Hemoglobin 14.5; Platelets 336; Potassium 4.0; Sodium 139   Recent Lipid Panel Lab Results  Component Value Date/Time   CHOL 136 07/29/2018 03:22 AM   TRIG 150 (H)  07/29/2018 03:22 AM   HDL 41 07/29/2018 03:22 AM   CHOLHDL 3.3 07/29/2018 03:22 AM   LDLCALC 65 07/29/2018 03:22 AM    Wt Readings from Last 3 Encounters:  02/14/19 182 lb (82.6 kg)  01/17/19 180 lb (81.6 kg)  11/15/18 190 lb (86.2 kg)     Objective:    Vital Signs:  BP 109/77    Pulse 62    Ht 5\' 10"  (1.778 m)    Wt 182 lb (82.6 kg)    BMI 26.11 kg/m    CONSTITUTIONAL:  Well nourished, well developed male in no acute distress.  EYES: anicteric MOUTH: oral mucosa is pink RESPIRATORY: Normal respiratory effort, symmetric expansion CARDIOVASCULAR: No peripheral edema SKIN: No rash, lesions or ulcers MUSCULOSKELETAL: no digital cyanosis NEURO: Cranial Nerves II-XII grossly intact, moves all extremities PSYCH: Intact judgement and insight.  A&O x 3, Mood/affect appropriate   ASSESSMENT & PLAN:    1. H/O Mechanical AVR -his last 2D echo earlier this month showed stable AVR.  Will continue on Plavix and warfarin.  2.  ASCAD - he is s/p NSTEMI in the setting of cardioembolic event/hypercoagulable state. This was a thrombotic occlusion of the mid LAD and diag and underwent PTCA with no stent. Trop peaked at >65.  He has not had any chest pain or shortness of breath.  He will continue on Plavix 75 mg daily and warfarin.  He walks and runs up to 1.5 miles daily.    3.  PFO -status post Amplatzer occluder device which is functioning normally on last echo.  He will continue on warfarin and Plavix for his history of CVA in the setting of hypercoagulable state.  4.  Thoracic aortic aneurysm -this is stable by recent 2D echo at 43 mm.  His blood  pressure is well controlled.  He will continue on statin therapy with atorvastatin 80 mg daily.  His LDL is at goal.  Ffollowed by Dr. Roxan Hockey with whom he has an appointment next month.  5.  Bilateral carotid stenosis - minimal plaque on dopplers 2019.   Continue on statin therapy.  6.  Hyperlipidemia -his LDL goal is less than 70.  His  last LDL was 65 on 07/29/2018.  He will continue on atorvastatin 80 mg daily and TriCor 145 mg daily.  I will repeat an FLp and ALT.    7.  Hypercoagulable state - followed by hematology.  He will continue on Coumadin therapy.  8.  Ischemic DCM -his EF has been 30 to 35% status post his thrombotic occlusion of the mid LAD.  He is on minimal heart failure therapy due to soft blood pressure and bradycardia.  At last OV he had a lot of problems with dizziness and weakness with low HR and his carvedilol and ACE I were stopped and his sx resolved .  I did discuss with EP in regards to device implantation since LV function has not improved in 2 months.  Dr. Rayann Heman felt that given his need for high-dose warfarin and his  increased risk for pocket hematoma during the procedure, it was too risky to hold or decrease Coumadin given that he has had an MI due to acute thrombotic events on lower doses of Coumadin.  Repeat echo in July showed EF 35-40% and limited echo in August was 30-35%.  I personally reviewed the echo from August and feel that the LVF is higher and likely 35-40%.  We again discussed the findings of the echo and the risks associated with pocket bleed if ICD done on anticoagulation and risk of recurrent CVA if anticoagulation held for ICD.  Given that EF is more likely at least 35% ICD would not be indicated at this time.  Cannot get MRI due to mechanical AVR.  Patient agrees with plan.  Unfortunately his BP is too soft for BB/ACE/ARB/spiro.  He is able to exercise without any problems and will continue in Cardiac rehab.  I have recommended that he stick to brisk walking and not running.   COVID-19 Education: The signs and symptoms of COVID-19 were discussed with the patient and how to seek care for testing (follow up with PCP or arrange E-visit).  The importance of social distancing was discussed today.  Patient Risk:   After full review of this patient's clinical status, I feel that they are at least  moderate risk at this time.  Time:   Today, I have spent 20 minutes directly with the patient on telemedicine discussing medical problems including aortic anduerysm, PFO, CAD, AVR, HLD.  We also reviewed the symptoms of COVID 19 and the ways to protect against contracting the virus with telehealth technology.  I spent an additional 5 minutes reviewing patient's chart including echo, labs.  Medication Adjustments/Labs and Tests Ordered: Current medicines are reviewed at length with the patient today.  Concerns regarding medicines are outlined above.  Tests Ordered: No orders of the defined types were placed in this encounter.  Medication Changes: No orders of the defined types were placed in this encounter.   Disposition:  Follow up in 6 month(s) with PA and 1 year with me  Signed, Fransico Him, MD  02/14/2019 11:00 AM    [place]

## 2019-02-14 NOTE — Patient Instructions (Signed)
Medication Instructions:  The current medical regimen is effective;  continue present plan and medications.  If you need a refill on your cardiac medications before your next appointment, please call your pharmacy.   Lab work: Please have fasting Lipid and ALT drawn. If you have labs (blood work) drawn today and your tests are completely normal, you will receive your results only by: [name] MyChart Message (if you have MyChart) OR . A paper copy in the mail If you have any lab test that is abnormal or we need to change your treatment, we will call you to review the results.  Testing/Procedures: Your physician has requested that you have an echocardiogram in February 2021.  You will be contacted to be scheduled for this test.  Please request it be completed a few days before your follow up appointment with Dr Radford Pax. Echocardiography is a painless test that uses sound waves to create images of your heart. It provides your doctor with information about the size and shape of your heart and how well your heart's chambers and valves are working. This procedure takes approximately one hour. There are no restrictions for this procedure.  Follow-Up: Follow up in 6 months with Dr. Radford Pax.  You will receive a letter in the mail 2 months before you are due.  Please call us when you receive this letter to schedule your follow up appointment.  Thank you for choosing [facility]!!

## 2019-02-16 ENCOUNTER — Other Ambulatory Visit: Payer: Self-pay | Admitting: Physician Assistant

## 2019-02-21 ENCOUNTER — Telehealth: Payer: Self-pay

## 2019-02-21 DIAGNOSIS — L570 Actinic keratosis: Secondary | ICD-10-CM | POA: Diagnosis not present

## 2019-02-21 DIAGNOSIS — D229 Melanocytic nevi, unspecified: Secondary | ICD-10-CM | POA: Diagnosis not present

## 2019-02-21 DIAGNOSIS — L814 Other melanin hyperpigmentation: Secondary | ICD-10-CM | POA: Diagnosis not present

## 2019-02-21 DIAGNOSIS — L821 Other seborrheic keratosis: Secondary | ICD-10-CM | POA: Diagnosis not present

## 2019-02-21 DIAGNOSIS — Z85828 Personal history of other malignant neoplasm of skin: Secondary | ICD-10-CM | POA: Diagnosis not present

## 2019-02-21 DIAGNOSIS — Z85820 Personal history of malignant melanoma of skin: Secondary | ICD-10-CM | POA: Diagnosis not present

## 2019-02-21 DIAGNOSIS — L819 Disorder of pigmentation, unspecified: Secondary | ICD-10-CM | POA: Diagnosis not present

## 2019-02-21 DIAGNOSIS — D1801 Hemangioma of skin and subcutaneous tissue: Secondary | ICD-10-CM | POA: Diagnosis not present

## 2019-02-21 NOTE — Telephone Encounter (Signed)
I spoke to the patient and gave him Dr Theodosia Blender recommendation.  He verbalized understanding.

## 2019-02-28 ENCOUNTER — Telehealth (HOSPITAL_COMMUNITY): Payer: Self-pay

## 2019-02-28 ENCOUNTER — Encounter (HOSPITAL_COMMUNITY)
Admission: RE | Admit: 2019-02-28 | Discharge: 2019-02-28 | Disposition: A | Discharge status: Home / Self Care | Payer: Medicare Other | Source: Ambulatory Visit | Attending: Cardiology | Admitting: Cardiology

## 2019-02-28 DIAGNOSIS — M1990 Unspecified osteoarthritis, unspecified site: Secondary | ICD-10-CM | POA: Insufficient documentation

## 2019-02-28 DIAGNOSIS — M109 Gout, unspecified: Secondary | ICD-10-CM | POA: Insufficient documentation

## 2019-02-28 DIAGNOSIS — K219 Gastro-esophageal reflux disease without esophagitis: Secondary | ICD-10-CM | POA: Insufficient documentation

## 2019-02-28 DIAGNOSIS — K760 Fatty (change of) liver, not elsewhere classified: Secondary | ICD-10-CM | POA: Insufficient documentation

## 2019-02-28 DIAGNOSIS — I447 Left bundle-branch block, unspecified: Secondary | ICD-10-CM | POA: Insufficient documentation

## 2019-02-28 DIAGNOSIS — D509 Iron deficiency anemia, unspecified: Secondary | ICD-10-CM | POA: Insufficient documentation

## 2019-02-28 DIAGNOSIS — Z23 Encounter for immunization: Secondary | ICD-10-CM | POA: Diagnosis not present

## 2019-02-28 DIAGNOSIS — Z7901 Long term (current) use of anticoagulants: Secondary | ICD-10-CM | POA: Insufficient documentation

## 2019-02-28 DIAGNOSIS — J449 Chronic obstructive pulmonary disease, unspecified: Secondary | ICD-10-CM | POA: Insufficient documentation

## 2019-02-28 DIAGNOSIS — E785 Hyperlipidemia, unspecified: Secondary | ICD-10-CM | POA: Insufficient documentation

## 2019-02-28 DIAGNOSIS — I2510 Atherosclerotic heart disease of native coronary artery without angina pectoris: Secondary | ICD-10-CM | POA: Insufficient documentation

## 2019-02-28 DIAGNOSIS — I110 Hypertensive heart disease with heart failure: Secondary | ICD-10-CM | POA: Insufficient documentation

## 2019-02-28 DIAGNOSIS — I2102 ST elevation (STEMI) myocardial infarction involving left anterior descending coronary artery: Secondary | ICD-10-CM | POA: Insufficient documentation

## 2019-02-28 DIAGNOSIS — Z7902 Long term (current) use of antithrombotics/antiplatelets: Secondary | ICD-10-CM | POA: Insufficient documentation

## 2019-02-28 DIAGNOSIS — Z79899 Other long term (current) drug therapy: Secondary | ICD-10-CM | POA: Insufficient documentation

## 2019-02-28 DIAGNOSIS — I509 Heart failure, unspecified: Secondary | ICD-10-CM | POA: Insufficient documentation

## 2019-02-28 DIAGNOSIS — Z9861 Coronary angioplasty status: Secondary | ICD-10-CM | POA: Insufficient documentation

## 2019-02-28 DIAGNOSIS — Z8673 Personal history of transient ischemic attack (TIA), and cerebral infarction without residual deficits: Secondary | ICD-10-CM | POA: Insufficient documentation

## 2019-02-28 DIAGNOSIS — F17210 Nicotine dependence, cigarettes, uncomplicated: Secondary | ICD-10-CM | POA: Insufficient documentation

## 2019-02-28 DIAGNOSIS — R011 Cardiac murmur, unspecified: Secondary | ICD-10-CM | POA: Insufficient documentation

## 2019-02-28 NOTE — Telephone Encounter (Signed)
Phone call to Pt to inquire about interest in CR program for in person rehab. Pt showed intrest through the virtual app. Pt information will be sent to support staff to get scheduled for orientation.

## 2019-03-01 ENCOUNTER — Ambulatory Visit (INDEPENDENT_AMBULATORY_CARE_PROVIDER_SITE_OTHER): Payer: Medicare Other

## 2019-03-01 ENCOUNTER — Other Ambulatory Visit: Payer: Self-pay

## 2019-03-01 DIAGNOSIS — Q2112 Patent foramen ovale: Secondary | ICD-10-CM

## 2019-03-01 DIAGNOSIS — Z7901 Long term (current) use of anticoagulants: Secondary | ICD-10-CM | POA: Diagnosis not present

## 2019-03-01 DIAGNOSIS — Z952 Presence of prosthetic heart valve: Secondary | ICD-10-CM | POA: Diagnosis not present

## 2019-03-01 DIAGNOSIS — I639 Cerebral infarction, unspecified: Secondary | ICD-10-CM | POA: Diagnosis not present

## 2019-03-01 DIAGNOSIS — Q211 Atrial septal defect: Secondary | ICD-10-CM

## 2019-03-01 LAB — POCT INR: INR: 3.2 — AB (ref 2.0–3.0)

## 2019-03-01 NOTE — Patient Instructions (Signed)
Description   Continue on same dosage 1 tablet daily except for 1/2 a tablet on Mondays, Wednesdays and Fridays. Recheck INR in 4 weeks. Call [phone] for any medication changes or upcoming procedures.

## 2019-03-08 ENCOUNTER — Telehealth (HOSPITAL_COMMUNITY): Payer: Self-pay

## 2019-03-08 NOTE — Telephone Encounter (Signed)
Cardiac Rehab Medication Review by a Pharmacist  Does the patient  feel that his/her medications are working for him/her?  yes  Has the patient been experiencing any side effects to the medications prescribed?  no  Does the patient measure his/her own blood pressure or blood glucose at home?  Yes. Patient reports taking BP daily, reports BP was 103/73 today 9/16.    Does the patient have any problems obtaining medications due to transportation or finances?   no  Understanding of regimen: good Understanding of indications: good Potential of compliance: good    Pharmacist comments: N/A    Henri Medal 03/08/2019 2:35 PM

## 2019-03-14 ENCOUNTER — Encounter (HOSPITAL_COMMUNITY)
Admission: RE | Admit: 2019-03-14 | Discharge: 2019-03-14 | Disposition: A | Discharge status: Home / Self Care | Payer: Medicare Other | Source: Ambulatory Visit | Attending: Cardiology | Admitting: Cardiology

## 2019-03-14 ENCOUNTER — Other Ambulatory Visit: Payer: Self-pay

## 2019-03-14 ENCOUNTER — Telehealth: Payer: Self-pay | Admitting: Cardiology

## 2019-03-14 ENCOUNTER — Encounter (HOSPITAL_COMMUNITY): Payer: Self-pay

## 2019-03-14 VITALS — BP 90/50 | HR 52 | Temp 97.3°F | Ht 70.0 in | Wt 184.1 lb

## 2019-03-14 DIAGNOSIS — Z9861 Coronary angioplasty status: Secondary | ICD-10-CM

## 2019-03-14 DIAGNOSIS — I213 ST elevation (STEMI) myocardial infarction of unspecified site: Secondary | ICD-10-CM

## 2019-03-14 HISTORY — DX: Atherosclerotic heart disease of native coronary artery without angina pectoris: I25.10

## 2019-03-14 NOTE — Progress Notes (Signed)
Patient here for orientation at cardiac rehab. Telemetry rhythm noted initially as Sinus brady 48 non sustained with a bundle branch block. [name] was then noted to have a period where he appeared to be in a junctional rhythm. Patient asymptomatic. Blood pressure 100/70. Oxygen saturation 97% on room air. Dr Theodosia Blender office called and notified. Spoke with Drue Novel RN. Will fax today's ECG tracings to Dr. Theodosia Blender office for review. Dr Radford Pax said to have the card master to have an hospital extender evaluate the patient. Cecilie Kicks NP paged and reviewed ECG tracings with Dr Ellyn Hack. No new orders received. Patient instructed to call Dr Theodosia Blender office if he develops symptoms. Patient left cardiac rehab without complaints and will return to exercise on Monday.Barnet Pall, RN,BSN 03/14/2019 3:36 PM

## 2019-03-14 NOTE — Telephone Encounter (Signed)
No other recs at this time

## 2019-03-14 NOTE — Progress Notes (Signed)
Cardiac Individual Treatment Plan  Patient Details  Name: Tracy Marshall. MRN: [id number] Date of Birth: July 04, 1950 Referring Provider:     CARDIAC REHAB PHASE II ORIENTATION from 03/14/2019 in [place]  Referring Provider  Fransico Him, MD      Initial Encounter Date:    CARDIAC REHAB PHASE II ORIENTATION from 03/14/2019 in [place]  Date  03/14/19      Visit Diagnosis: 07/27/18 STEMI  07/27/18 PTCA LAD & D1  Patient's Home Medications on Admission:  Current Outpatient Medications:  .  acetaminophen (TYLENOL) 500 MG tablet, Take 1,000 mg by mouth every 6 (six) hours as needed for moderate pain., Disp: , Rfl:  .  atorvastatin (LIPITOR) 80 MG tablet, TAKE 1 TABLET BY MOUTH DAILY AT 6PM, Disp: 90 tablet, Rfl: 3 .  Carboxymethylcellul-Glycerin (LUBRICATING EYE DROPS OP), Place 1 drop into both eyes daily as needed (dry eyes)., Disp: , Rfl:  .  clopidogrel (PLAVIX) 75 MG tablet, Take 1 tablet (75 mg total) by mouth daily., Disp: 30 tablet, Rfl: 11 .  colchicine 0.6 MG tablet, Take 0.6 mg by mouth daily as needed (gout flares). , Disp: , Rfl:  .  diazepam (VALIUM) 2 MG tablet, Take 4 mg by mouth at bedtime as needed for anxiety or sedation. , Disp: , Rfl:  .  fenofibrate (TRICOR) 145 MG tablet, Take 1 tablet (145 mg total) by mouth daily. (Patient taking differently: Take 145 mg by mouth every evening. ), Disp: 90 tablet, Rfl: 3 .  ferrous sulfate 325 (65 FE) MG tablet, Take 325 mg by mouth 2 (two) times daily with a meal., Disp: , Rfl:  .  fluticasone (FLONASE) 50 MCG/ACT nasal spray, Place 1-2 sprays into both nostrils daily as needed for allergies. , Disp: , Rfl:  .  gabapentin (NEURONTIN) 300 MG capsule, Take 900 mg by mouth 3 (three) times daily., Disp: , Rfl:  .  nitroGLYCERIN (NITROSTAT) 0.4 MG SL tablet, Place 1 tablet (0.4 mg total) under the tongue every 5 (five) minutes as needed for chest pain., Disp: 15  tablet, Rfl: 3 .  pantoprazole (PROTONIX) 40 MG tablet, Take 40 mg by mouth daily., Disp: , Rfl:  .  ranitidine (ZANTAC) 75 MG tablet, Take 75 mg by mouth 2 (two) times daily as needed for heartburn., Disp: , Rfl:  .  warfarin (COUMADIN) 3 MG tablet, TAKE AS DIRECTED BY COUMADIN CLINIC (Patient taking differently: Take 1.5-3 mg by mouth See admin instructions. 3mg  on Sunday, Tuesday, Thursday and Saturday and 1.5mg  on all other days), Disp: 30 tablet, Rfl: 3  Past Medical History: Past Medical History:  Diagnosis Date  . Anemia    iron  . Arthritis   . Atypical moles    melanomna  . Basal cell carcinoma   . Bilateral carotid artery stenosis 08/03/2014   1-39% right and 40-59% left carotid artery stenosis  . CAD (coronary artery disease), native coronary artery 08/10/2018   S/P anterior STEMI secondary to thrombotic occlusion of the mid LAD and diag s/p PTCA with no stent 07/2018  . Chronic anticoagulation    for mechanical AVR  . Cluster headaches   . Coronary artery disease   . CVA (cerebral vascular accident) ([place]) 07/24/2014    MRI indicating 2 punctate foci of acute infarction.  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   . Diverticulosis   . Dizziness   . Episodic  recurrent vertigo 2002 & 2005   carotid dopplers w no clinically significant stenosis  . Fatty liver    hx of elevated hepatic transaminases-negative workup in 2009 except for U/S suggesting fatty liver  . GERD (gastroesophageal reflux disease)   . Gout   . Heart murmur   . Hyperlipidemia   . Hyperlipidemia LDL goal <70 12/05/2015  . Joint pain   . LBBB (left bundle branch block)   . Melanoma ([place])    hx of melanoma and multiple basal cell carcinomas Dr Tonia Brooms  . PFO (patent foramen ovale) 12/05/2015  . Positive ANA (antinuclear antibody)   . S/P AVR (aortic valve replacement)    mechanical  . TIA (transient ischemic attack)     Tobacco Use: Social  History   Tobacco Use  Smoking Status Never Smoker  Smokeless Tobacco Never Used    Labs: Recent Review Flowsheet Data    Labs for ITP Cardiac and Pulmonary Rehab Latest Ref Rng & Units 12/06/2015 02/26/2016 08/04/2016 07/27/2018 07/29/2018   Cholestrol 0 - 200 mg/dL 202(H) 149 - - 136   LDLCALC 0 - 99 mg/dL 60 59 - - 65   HDL >40 mg/dL 76 67 - - 41   Trlycerides <150 mg/dL 331(H) 115 - - 150(H)   Hemoglobin A1c <5.7 % - - - - -   PHART 7.350 - 7.450 - - - 7.185(LL) -   PCO2ART 32.0 - 48.0 mmHg - - - 43.3 -   HCO3 20.0 - 28.0 mmol/L - - - 16.3(L) -   TCO2 22 - 32 mmol/L - - 24 18(L) -   ACIDBASEDEF 0.0 - 2.0 mmol/L - - - 12.0(H) -   O2SAT % - - - 90.0 -      Capillary Blood Glucose: Lab Results  Component Value Date   GLUCAP 117 (H) 08/04/2016     Exercise Target Goals: Exercise Program Goal: Individual exercise prescription set using results from initial 6 min walk test and THRR while considering  patient's activity barriers and safety.   Exercise Prescription Goal: Starting with aerobic activity 30 plus minutes a day, 3 days per week for initial exercise prescription. Provide home exercise prescription and guidelines that participant acknowledges understanding prior to discharge.  Activity Barriers & Risk Stratification: Activity Barriers & Cardiac Risk Stratification - 03/14/19 1349      Activity Barriers & Cardiac Risk Stratification   Activity Barriers  None    Cardiac Risk Stratification  High       6 Minute Walk: 6 Minute Walk    Row Name 03/14/19 1406         6 Minute Walk   Phase  Initial     Distance  2078 feet     Walk Time  6 minutes     # of Rest Breaks  0     MPH  3.94     METS  4.54     RPE  12     Perceived Dyspnea   0     VO2 Peak  15.89     Symptoms  No     Resting HR  52 bpm     Resting BP  90/50 100/68 recheck BP     Resting Oxygen Saturation   97 %     Exercise Oxygen Saturation  during 6 min walk  98 %     Max Ex. HR  94 bpm     Max  Ex. BP  132/82  2 Minute Post BP  98/68        Oxygen Initial Assessment:   Oxygen Re-Evaluation:   Oxygen Discharge (Final Oxygen Re-Evaluation):   Initial Exercise Prescription: Initial Exercise Prescription - 03/14/19 1500      Date of Initial Exercise RX and Referring Provider   Date  03/14/19    Referring Provider  Fransico Him, MD    Expected Discharge Date  05/12/19      Treadmill   MPH  3.2    Grade  1    Minutes  15    METs  3.89      NuStep   Level  4    SPM  85    Minutes  15    METs  3      Prescription Details   Frequency (times per week)  3    Duration  Progress to 30 minutes of continuous aerobic without signs/symptoms of physical distress      Intensity   THRR 40-80% of Max Heartrate  62-123    Ratings of Perceived Exertion  11-13    Perceived Dyspnea  0-4      Progression   Progression  Continue to progress workloads to maintain intensity without signs/symptoms of physical distress.      Resistance Training   Training Prescription  Yes    Weight  5lbs    Reps  10-15       Perform Capillary Blood Glucose checks as needed.  Exercise Prescription Changes:   Exercise Comments:   Exercise Goals and Review: Exercise Goals    Row Name 03/14/19 1437             Exercise Goals   Increase Physical Activity  Yes       Intervention  Provide advice, education, support and counseling about physical activity/exercise needs.;Develop an individualized exercise prescription for aerobic and resistive training based on initial evaluation findings, risk stratification, comorbidities and participant's personal goals.       Expected Outcomes  Short Term: Attend rehab on a regular basis to increase amount of physical activity.;Long Term: Exercising regularly at least 3-5 days a week.;Long Term: Add in home exercise to make exercise part of routine and to increase amount of physical activity.       Increase Strength and Stamina  Yes        Intervention  Provide advice, education, support and counseling about physical activity/exercise needs.;Develop an individualized exercise prescription for aerobic and resistive training based on initial evaluation findings, risk stratification, comorbidities and participant's personal goals.       Expected Outcomes  Short Term: Increase workloads from initial exercise prescription for resistance, speed, and METs.;Short Term: Perform resistance training exercises routinely during rehab and add in resistance training at home;Long Term: Improve cardiorespiratory fitness, muscular endurance and strength as measured by increased METs and functional capacity (6MWT)       Able to understand and use rate of perceived exertion (RPE) scale  Yes       Intervention  Provide education and explanation on how to use RPE scale       Expected Outcomes  Short Term: Able to use RPE daily in rehab to express subjective intensity level;Long Term:  Able to use RPE to guide intensity level when exercising independently       Knowledge and understanding of Target Heart Rate Range (THRR)  Yes       Intervention  Provide education and explanation of THRR including how the  numbers were predicted and where they are located for reference       Expected Outcomes  Short Term: Able to state/look up THRR;Long Term: Able to use THRR to govern intensity when exercising independently;Short Term: Able to use daily as guideline for intensity in rehab       Able to check pulse independently  Yes       Intervention  Provide education and demonstration on how to check pulse in carotid and radial arteries.;Review the importance of being able to check your own pulse for safety during independent exercise       Expected Outcomes  Short Term: Able to explain why pulse checking is important during independent exercise;Long Term: Able to check pulse independently and accurately       Understanding of Exercise Prescription  Yes       Intervention   Provide education, explanation, and written materials on patient's individual exercise prescription       Expected Outcomes  Short Term: Able to explain program exercise prescription;Long Term: Able to explain home exercise prescription to exercise independently          Exercise Goals Re-Evaluation :    Discharge Exercise Prescription (Final Exercise Prescription Changes):   Nutrition:  Target Goals: Understanding of nutrition guidelines, daily intake of sodium 1500mg , cholesterol 200mg , calories 30% from fat and 7% or less from saturated fats, daily to have 5 or more servings of fruits and vegetables.  Biometrics: Pre Biometrics - 03/14/19 1347      Pre Biometrics   Height  5\' 10"  (1.778 m)    Weight  83.5 kg    Waist Circumference  37.5 inches    Hip Circumference  40.5 inches    Waist to Hip Ratio  0.93 %    BMI (Calculated)  26.41    Triceps Skinfold  9 mm    % Body Fat  23.6 %    Grip Strength  48.5 kg    Flexibility  14.5 in    Single Leg Stand  30 seconds        Nutrition Therapy Plan and Nutrition Goals:   Nutrition Assessments:   Nutrition Goals Re-Evaluation:   Nutrition Goals Discharge (Final Nutrition Goals Re-Evaluation):   Psychosocial: Target Goals: Acknowledge presence or absence of significant depression and/or stress, maximize coping skills, provide positive support system. Participant is able to verbalize types and ability to use techniques and skills needed for reducing stress and depression.  Initial Review & Psychosocial Screening: Initial Psych Review & Screening - 03/14/19 1544      Initial Review   Current issues with  None Identified      Family Dynamics   Good Support System?  Yes   [name] has his wife for support     Barriers   Psychosocial barriers to participate in program  There are no identifiable barriers or psychosocial needs.      Screening Interventions   Interventions  Encouraged to exercise       Quality of Life  Scores: Quality of Life - 03/14/19 1517      Quality of Life   Select  Quality of Life      Quality of Life Scores   Health/Function Pre  23.3 %    Socioeconomic Pre  23.43 %    Psych/Spiritual Pre  22.5 %    Family Pre  24 %    GLOBAL Pre  23.24 %      Scores of 19 and below  usually indicate a poorer quality of life in these areas.  A difference of  2-3 points is a clinically meaningful difference.  A difference of 2-3 points in the total score of the Quality of Life Index has been associated with significant improvement in overall quality of life, self-image, physical symptoms, and general health in studies assessing change in quality of life.  PHQ-9: Recent Review Flowsheet Data    There is no flowsheet data to display.     Interpretation of Total Score  Total Score Depression Severity:  1-4 = Minimal depression, 5-9 = Mild depression, 10-14 = Moderate depression, 15-19 = Moderately severe depression, 20-27 = Severe depression   Psychosocial Evaluation and Intervention:   Psychosocial Re-Evaluation:   Psychosocial Discharge (Final Psychosocial Re-Evaluation):   Vocational Rehabilitation: Provide vocational rehab assistance to qualifying candidates.   Vocational Rehab Evaluation & Intervention: Vocational Rehab - 03/14/19 1549      Initial Vocational Rehab Evaluation & Intervention   Assessment shows need for Vocational Rehabilitation  No       Education: Education Goals: Education classes will be provided on a weekly basis, covering required topics. Participant will state understanding/return demonstration of topics presented.  Learning Barriers/Preferences: Learning Barriers/Preferences - 03/14/19 1355      Learning Barriers/Preferences   Learning Barriers  Sight;Hearing    Learning Preferences  None       Education Topics: Hypertension, Hypertension Reduction -Define heart disease and high blood pressure. Discus how high blood pressure affects the body  and ways to reduce high blood pressure.   Exercise and Your Heart -Discuss why it is important to exercise, the FITT principles of exercise, normal and abnormal responses to exercise, and how to exercise safely.   Angina -Discuss definition of angina, causes of angina, treatment of angina, and how to decrease risk of having angina.   Cardiac Medications -Review what the following cardiac medications are used for, how they affect the body, and side effects that may occur when taking the medications.  Medications include Aspirin, Beta blockers, calcium channel blockers, ACE Inhibitors, angiotensin receptor blockers, diuretics, digoxin, and antihyperlipidemics.   Congestive Heart Failure -Discuss the definition of CHF, how to live with CHF, the signs and symptoms of CHF, and how keep track of weight and sodium intake.   Heart Disease and Intimacy -Discus the effect sexual activity has on the heart, how changes occur during intimacy as we age, and safety during sexual activity.   Smoking Cessation / COPD -Discuss different methods to quit smoking, the health benefits of quitting smoking, and the definition of COPD.   Nutrition I: Fats -Discuss the types of cholesterol, what cholesterol does to the heart, and how cholesterol levels can be controlled.   Nutrition II: Labels -Discuss the different components of food labels and how to read food label   Heart Parts/Heart Disease and PAD -Discuss the anatomy of the heart, the pathway of blood circulation through the heart, and these are affected by heart disease.   Stress I: Signs and Symptoms -Discuss the causes of stress, how stress may lead to anxiety and depression, and ways to limit stress.   Stress II: Relaxation -Discuss different types of relaxation techniques to limit stress.   Warning Signs of Stroke / TIA -Discuss definition of a stroke, what the signs and symptoms are of a stroke, and how to identify when someone is  having stroke.   Knowledge Questionnaire Score: Knowledge Questionnaire Score - 03/14/19 1517      Knowledge  Questionnaire Score   Pre Score  21/24       Core Components/Risk Factors/Patient Goals at Admission: Personal Goals and Risk Factors at Admission - 03/14/19 1549      Core Components/Risk Factors/Patient Goals on Admission    Weight Management  Yes       Core Components/Risk Factors/Patient Goals Review:    Core Components/Risk Factors/Patient Goals at Discharge (Final Review):    ITP Comments: ITP Comments    Row Name 03/14/19 1344           ITP Comments  Medical Director- Dr. Fransico Him, MD          Comments: Patient attended orientation on 03/14/2019 to review rules and guidelines for program.  Completed 6 minute walk test, Intitial ITP, and exercise prescription. Initial blood pressure 90/50 with a recheck blood pressure of 100/68. Antony Madura, Sinus Rhythm with a bundle branch block, T wave inversion with possible period of junctional rhythm noted. Please see previous note in epic for specifics..  Asymptomatic. Patient given water. Safety measures and social distancing in place per CDC guidelines.Barnet Pall, RN,BSN 03/14/2019 3:58 PM

## 2019-03-14 NOTE — Telephone Encounter (Signed)
Pt presented for cardiac rehab, no complaints no dizziness or chest pain or SOB.  He did have rhythm with P wave going flat then inverted. HR only slightly slower.  On no BB, no rate slowing meds.  Pt walks twice a day and no issues.  Reviewed with Dr. Debara Pickett and since pt stable no further eval.   Will send note to Dr. Radford Pax to see if she has other recommendations.

## 2019-03-15 ENCOUNTER — Telehealth: Payer: Self-pay | Admitting: *Deleted

## 2019-03-15 NOTE — Telephone Encounter (Signed)
Left message to call back.  Per Mickel Baas it is OK to use her 4 PM spot on Monday

## 2019-03-15 NOTE — Telephone Encounter (Signed)
-----   Message from Isaiah Serge, NP sent at 03/15/2019  7:51 AM EDT ----- Please have pt seen in office to eval EKG and Junctional rhythm.  This week or next week.

## 2019-03-16 NOTE — Telephone Encounter (Signed)
lpmtcb 9/24

## 2019-03-20 ENCOUNTER — Other Ambulatory Visit: Payer: Self-pay

## 2019-03-20 ENCOUNTER — Encounter (HOSPITAL_COMMUNITY)
Admission: RE | Admit: 2019-03-20 | Discharge: 2019-03-20 | Disposition: A | Discharge status: Home / Self Care | Payer: Medicare Other | Source: Ambulatory Visit | Attending: Cardiology | Admitting: Cardiology

## 2019-03-20 ENCOUNTER — Encounter (HOSPITAL_COMMUNITY): Payer: Medicare Other

## 2019-03-20 DIAGNOSIS — I447 Left bundle-branch block, unspecified: Secondary | ICD-10-CM | POA: Diagnosis not present

## 2019-03-20 DIAGNOSIS — J449 Chronic obstructive pulmonary disease, unspecified: Secondary | ICD-10-CM | POA: Diagnosis not present

## 2019-03-20 DIAGNOSIS — F17210 Nicotine dependence, cigarettes, uncomplicated: Secondary | ICD-10-CM | POA: Diagnosis not present

## 2019-03-20 DIAGNOSIS — Z79899 Other long term (current) drug therapy: Secondary | ICD-10-CM | POA: Diagnosis not present

## 2019-03-20 DIAGNOSIS — D509 Iron deficiency anemia, unspecified: Secondary | ICD-10-CM | POA: Diagnosis not present

## 2019-03-20 DIAGNOSIS — I509 Heart failure, unspecified: Secondary | ICD-10-CM | POA: Diagnosis not present

## 2019-03-20 DIAGNOSIS — Z8673 Personal history of transient ischemic attack (TIA), and cerebral infarction without residual deficits: Secondary | ICD-10-CM | POA: Diagnosis not present

## 2019-03-20 DIAGNOSIS — R011 Cardiac murmur, unspecified: Secondary | ICD-10-CM | POA: Diagnosis not present

## 2019-03-20 DIAGNOSIS — K219 Gastro-esophageal reflux disease without esophagitis: Secondary | ICD-10-CM | POA: Diagnosis not present

## 2019-03-20 DIAGNOSIS — Z7901 Long term (current) use of anticoagulants: Secondary | ICD-10-CM | POA: Diagnosis not present

## 2019-03-20 DIAGNOSIS — M1990 Unspecified osteoarthritis, unspecified site: Secondary | ICD-10-CM | POA: Diagnosis not present

## 2019-03-20 DIAGNOSIS — I213 ST elevation (STEMI) myocardial infarction of unspecified site: Secondary | ICD-10-CM

## 2019-03-20 DIAGNOSIS — I110 Hypertensive heart disease with heart failure: Secondary | ICD-10-CM | POA: Diagnosis not present

## 2019-03-20 DIAGNOSIS — I2102 ST elevation (STEMI) myocardial infarction involving left anterior descending coronary artery: Secondary | ICD-10-CM | POA: Diagnosis not present

## 2019-03-20 DIAGNOSIS — Z9861 Coronary angioplasty status: Secondary | ICD-10-CM

## 2019-03-20 DIAGNOSIS — Z7902 Long term (current) use of antithrombotics/antiplatelets: Secondary | ICD-10-CM | POA: Diagnosis not present

## 2019-03-20 DIAGNOSIS — I2510 Atherosclerotic heart disease of native coronary artery without angina pectoris: Secondary | ICD-10-CM | POA: Diagnosis not present

## 2019-03-20 DIAGNOSIS — M109 Gout, unspecified: Secondary | ICD-10-CM | POA: Diagnosis not present

## 2019-03-20 DIAGNOSIS — E785 Hyperlipidemia, unspecified: Secondary | ICD-10-CM | POA: Diagnosis not present

## 2019-03-20 DIAGNOSIS — K760 Fatty (change of) liver, not elsewhere classified: Secondary | ICD-10-CM | POA: Diagnosis not present

## 2019-03-20 NOTE — Telephone Encounter (Signed)
I spoke to the patient and informed him that someone will contact him to schedule an appointment in the next few weeks.  He verbalized understanding.

## 2019-03-20 NOTE — Progress Notes (Signed)
Daily Session Note  Patient Details  Name: Tracy Marshall. MRN: [id number] Date of Birth: 06-16-1951 Referring Provider:     CARDIAC REHAB PHASE II ORIENTATION from 03/14/2019 in [place]  Referring Provider  Fransico Him, MD      Encounter Date: 03/20/2019  Check In: Session Check In - 03/20/19 1050      Check-In   Supervising physician immediately available to respond to emergencies  [facility] immediately available    Physician(s)  Dr. Marthenia Rolling    Location  [facility]    Staff Present  Jiles Garter, RN, Bjorn Loser, MS, Exercise Physiologist;Portia Rollene Rotunda, RN, Deland Pretty, MS, ACSM CEP, Exercise Physiologist;Brittany Durene Fruits, BS, ACSM CEP, Exercise Physiologist    Virtual Visit  No    Medication changes reported      No    Fall or balance concerns reported     No    Tobacco Cessation  No Change    Warm-up and Cool-down  Performed on first and last piece of equipment    Resistance Training Performed  Yes    VAD Patient?  No    PAD/SET Patient?  No      Pain Assessment   Currently in Pain?  No/denies    Multiple Pain Sites  No       Capillary Blood Glucose: No results found for this or any previous visit (from the past 24 hour(s)).    Social History   Tobacco Use  Smoking Status Never Smoker  Smokeless Tobacco Never Used    Goals Met:  Exercise tolerated well  Goals Unmet:  Not Applicable  Comments: Pt started cardiac rehab today.  Pt tolerated light exercise without difficulty. VSS, telemetry-SB, asymptomatic.  Medication list reconciled. Pt denies barriers to medicaiton compliance.  PSYCHOSOCIAL ASSESSMENT:  PHQ-0. Pt exhibits positive coping skills, hopeful outlook with supportive family. No psychosocial needs identified at this time, no psychosocial interventions necessary.  Pt oriented to exercise equipment and routine.    Understanding verbalized.    Dr. Fransico Him is Medical  Director for Cardiac Rehab at [facility].

## 2019-03-20 NOTE — Telephone Encounter (Signed)
Follow Up ° °Patient is returning call. Please give patient a call back.  °

## 2019-03-20 NOTE — Telephone Encounter (Signed)
lpmtcb 9/28

## 2019-03-22 ENCOUNTER — Encounter (HOSPITAL_COMMUNITY): Payer: Medicare Other

## 2019-03-22 ENCOUNTER — Other Ambulatory Visit: Payer: Self-pay

## 2019-03-22 ENCOUNTER — Encounter (HOSPITAL_COMMUNITY)
Admission: RE | Admit: 2019-03-22 | Discharge: 2019-03-22 | Disposition: A | Discharge status: Home / Self Care | Payer: Medicare Other | Source: Ambulatory Visit | Attending: Cardiology | Admitting: Cardiology

## 2019-03-22 DIAGNOSIS — D509 Iron deficiency anemia, unspecified: Secondary | ICD-10-CM | POA: Diagnosis not present

## 2019-03-22 DIAGNOSIS — I447 Left bundle-branch block, unspecified: Secondary | ICD-10-CM | POA: Diagnosis not present

## 2019-03-22 DIAGNOSIS — I2102 ST elevation (STEMI) myocardial infarction involving left anterior descending coronary artery: Secondary | ICD-10-CM | POA: Diagnosis not present

## 2019-03-22 DIAGNOSIS — I2510 Atherosclerotic heart disease of native coronary artery without angina pectoris: Secondary | ICD-10-CM | POA: Diagnosis not present

## 2019-03-22 DIAGNOSIS — Z9861 Coronary angioplasty status: Secondary | ICD-10-CM

## 2019-03-22 DIAGNOSIS — I509 Heart failure, unspecified: Secondary | ICD-10-CM | POA: Diagnosis not present

## 2019-03-22 DIAGNOSIS — I213 ST elevation (STEMI) myocardial infarction of unspecified site: Secondary | ICD-10-CM

## 2019-03-22 DIAGNOSIS — I110 Hypertensive heart disease with heart failure: Secondary | ICD-10-CM | POA: Diagnosis not present

## 2019-03-24 ENCOUNTER — Encounter (HOSPITAL_COMMUNITY): Payer: Medicare Other

## 2019-03-24 ENCOUNTER — Encounter (HOSPITAL_COMMUNITY)
Admission: RE | Admit: 2019-03-24 | Discharge: 2019-03-24 | Disposition: A | Discharge status: Home / Self Care | Payer: Medicare Other | Source: Ambulatory Visit | Attending: Cardiology | Admitting: Cardiology

## 2019-03-24 ENCOUNTER — Other Ambulatory Visit: Payer: Self-pay

## 2019-03-24 DIAGNOSIS — I2510 Atherosclerotic heart disease of native coronary artery without angina pectoris: Secondary | ICD-10-CM | POA: Diagnosis not present

## 2019-03-24 DIAGNOSIS — D509 Iron deficiency anemia, unspecified: Secondary | ICD-10-CM | POA: Diagnosis not present

## 2019-03-24 DIAGNOSIS — F17210 Nicotine dependence, cigarettes, uncomplicated: Secondary | ICD-10-CM | POA: Diagnosis not present

## 2019-03-24 DIAGNOSIS — J449 Chronic obstructive pulmonary disease, unspecified: Secondary | ICD-10-CM | POA: Insufficient documentation

## 2019-03-24 DIAGNOSIS — E785 Hyperlipidemia, unspecified: Secondary | ICD-10-CM | POA: Insufficient documentation

## 2019-03-24 DIAGNOSIS — Z79899 Other long term (current) drug therapy: Secondary | ICD-10-CM | POA: Insufficient documentation

## 2019-03-24 DIAGNOSIS — Z8673 Personal history of transient ischemic attack (TIA), and cerebral infarction without residual deficits: Secondary | ICD-10-CM | POA: Diagnosis not present

## 2019-03-24 DIAGNOSIS — I509 Heart failure, unspecified: Secondary | ICD-10-CM | POA: Diagnosis not present

## 2019-03-24 DIAGNOSIS — Z9861 Coronary angioplasty status: Secondary | ICD-10-CM | POA: Insufficient documentation

## 2019-03-24 DIAGNOSIS — Z7901 Long term (current) use of anticoagulants: Secondary | ICD-10-CM | POA: Insufficient documentation

## 2019-03-24 DIAGNOSIS — M109 Gout, unspecified: Secondary | ICD-10-CM | POA: Diagnosis not present

## 2019-03-24 DIAGNOSIS — I110 Hypertensive heart disease with heart failure: Secondary | ICD-10-CM | POA: Insufficient documentation

## 2019-03-24 DIAGNOSIS — I447 Left bundle-branch block, unspecified: Secondary | ICD-10-CM | POA: Insufficient documentation

## 2019-03-24 DIAGNOSIS — K760 Fatty (change of) liver, not elsewhere classified: Secondary | ICD-10-CM | POA: Insufficient documentation

## 2019-03-24 DIAGNOSIS — Z7902 Long term (current) use of antithrombotics/antiplatelets: Secondary | ICD-10-CM | POA: Insufficient documentation

## 2019-03-24 DIAGNOSIS — M1990 Unspecified osteoarthritis, unspecified site: Secondary | ICD-10-CM | POA: Diagnosis not present

## 2019-03-24 DIAGNOSIS — I2102 ST elevation (STEMI) myocardial infarction involving left anterior descending coronary artery: Secondary | ICD-10-CM | POA: Insufficient documentation

## 2019-03-24 DIAGNOSIS — R011 Cardiac murmur, unspecified: Secondary | ICD-10-CM | POA: Diagnosis not present

## 2019-03-24 DIAGNOSIS — I213 ST elevation (STEMI) myocardial infarction of unspecified site: Secondary | ICD-10-CM

## 2019-03-24 DIAGNOSIS — K219 Gastro-esophageal reflux disease without esophagitis: Secondary | ICD-10-CM | POA: Insufficient documentation

## 2019-03-27 ENCOUNTER — Encounter (HOSPITAL_COMMUNITY): Payer: Medicare Other

## 2019-03-27 ENCOUNTER — Other Ambulatory Visit: Payer: Self-pay

## 2019-03-27 ENCOUNTER — Encounter (HOSPITAL_COMMUNITY)
Admission: RE | Admit: 2019-03-27 | Discharge: 2019-03-27 | Disposition: A | Discharge status: Home / Self Care | Payer: Medicare Other | Source: Ambulatory Visit | Attending: Cardiology | Admitting: Cardiology

## 2019-03-27 DIAGNOSIS — I509 Heart failure, unspecified: Secondary | ICD-10-CM | POA: Diagnosis not present

## 2019-03-27 DIAGNOSIS — I2510 Atherosclerotic heart disease of native coronary artery without angina pectoris: Secondary | ICD-10-CM | POA: Diagnosis not present

## 2019-03-27 DIAGNOSIS — D509 Iron deficiency anemia, unspecified: Secondary | ICD-10-CM | POA: Diagnosis not present

## 2019-03-27 DIAGNOSIS — I447 Left bundle-branch block, unspecified: Secondary | ICD-10-CM | POA: Diagnosis not present

## 2019-03-27 DIAGNOSIS — I110 Hypertensive heart disease with heart failure: Secondary | ICD-10-CM | POA: Diagnosis not present

## 2019-03-27 DIAGNOSIS — Z9861 Coronary angioplasty status: Secondary | ICD-10-CM

## 2019-03-27 DIAGNOSIS — I2102 ST elevation (STEMI) myocardial infarction involving left anterior descending coronary artery: Secondary | ICD-10-CM | POA: Diagnosis not present

## 2019-03-27 DIAGNOSIS — I213 ST elevation (STEMI) myocardial infarction of unspecified site: Secondary | ICD-10-CM

## 2019-03-28 ENCOUNTER — Other Ambulatory Visit: Payer: Self-pay

## 2019-03-29 ENCOUNTER — Other Ambulatory Visit: Payer: Self-pay

## 2019-03-29 ENCOUNTER — Ambulatory Visit (INDEPENDENT_AMBULATORY_CARE_PROVIDER_SITE_OTHER): Payer: Medicare Other | Admitting: *Deleted

## 2019-03-29 ENCOUNTER — Encounter (HOSPITAL_COMMUNITY): Payer: Medicare Other

## 2019-03-29 ENCOUNTER — Encounter (HOSPITAL_COMMUNITY)
Admission: RE | Admit: 2019-03-29 | Discharge: 2019-03-29 | Disposition: A | Discharge status: Home / Self Care | Payer: Medicare Other | Source: Ambulatory Visit | Attending: Cardiology | Admitting: Cardiology

## 2019-03-29 DIAGNOSIS — M47814 Spondylosis without myelopathy or radiculopathy, thoracic region: Secondary | ICD-10-CM | POA: Diagnosis not present

## 2019-03-29 DIAGNOSIS — G894 Chronic pain syndrome: Secondary | ICD-10-CM | POA: Diagnosis not present

## 2019-03-29 DIAGNOSIS — I447 Left bundle-branch block, unspecified: Secondary | ICD-10-CM | POA: Diagnosis not present

## 2019-03-29 DIAGNOSIS — Z952 Presence of prosthetic heart valve: Secondary | ICD-10-CM

## 2019-03-29 DIAGNOSIS — I509 Heart failure, unspecified: Secondary | ICD-10-CM | POA: Diagnosis not present

## 2019-03-29 DIAGNOSIS — I2102 ST elevation (STEMI) myocardial infarction involving left anterior descending coronary artery: Secondary | ICD-10-CM | POA: Diagnosis not present

## 2019-03-29 DIAGNOSIS — Z7901 Long term (current) use of anticoagulants: Secondary | ICD-10-CM

## 2019-03-29 DIAGNOSIS — Q211 Atrial septal defect: Secondary | ICD-10-CM

## 2019-03-29 DIAGNOSIS — I213 ST elevation (STEMI) myocardial infarction of unspecified site: Secondary | ICD-10-CM

## 2019-03-29 DIAGNOSIS — Z9861 Coronary angioplasty status: Secondary | ICD-10-CM

## 2019-03-29 DIAGNOSIS — I2510 Atherosclerotic heart disease of native coronary artery without angina pectoris: Secondary | ICD-10-CM | POA: Diagnosis not present

## 2019-03-29 DIAGNOSIS — I110 Hypertensive heart disease with heart failure: Secondary | ICD-10-CM | POA: Diagnosis not present

## 2019-03-29 DIAGNOSIS — Q2112 Patent foramen ovale: Secondary | ICD-10-CM

## 2019-03-29 DIAGNOSIS — D509 Iron deficiency anemia, unspecified: Secondary | ICD-10-CM | POA: Diagnosis not present

## 2019-03-29 LAB — POCT INR: INR: 3 (ref 2.0–3.0)

## 2019-03-29 NOTE — Progress Notes (Signed)
Reviewed home exercise guidelines with patient including endpoints, temperature precautions, target heart rate and rate of perceived exertion. Pt is walking 45 minutes, 4 days/week as his mode of home exercise. Pt voices understanding of instructions given.  Sol Passer, MS, ACSM CEP

## 2019-03-29 NOTE — Patient Instructions (Signed)
Description   Continue on same dosage 1 tablet daily except for 1/2 a tablet on Mondays, Wednesdays and Fridays. Recheck INR in 5 weeks. Call [phone] for any medication changes or upcoming procedures.

## 2019-03-31 ENCOUNTER — Other Ambulatory Visit: Payer: Self-pay

## 2019-03-31 ENCOUNTER — Encounter (HOSPITAL_COMMUNITY): Payer: Medicare Other

## 2019-03-31 ENCOUNTER — Encounter: Payer: Self-pay | Admitting: Neurology

## 2019-03-31 ENCOUNTER — Encounter (HOSPITAL_COMMUNITY)
Admission: RE | Admit: 2019-03-31 | Discharge: 2019-03-31 | Disposition: A | Discharge status: Home / Self Care | Payer: Medicare Other | Source: Ambulatory Visit | Attending: Cardiology | Admitting: Cardiology

## 2019-03-31 DIAGNOSIS — I2102 ST elevation (STEMI) myocardial infarction involving left anterior descending coronary artery: Secondary | ICD-10-CM | POA: Diagnosis not present

## 2019-03-31 DIAGNOSIS — I447 Left bundle-branch block, unspecified: Secondary | ICD-10-CM | POA: Diagnosis not present

## 2019-03-31 DIAGNOSIS — D509 Iron deficiency anemia, unspecified: Secondary | ICD-10-CM | POA: Diagnosis not present

## 2019-03-31 DIAGNOSIS — I509 Heart failure, unspecified: Secondary | ICD-10-CM | POA: Diagnosis not present

## 2019-03-31 DIAGNOSIS — Z9861 Coronary angioplasty status: Secondary | ICD-10-CM

## 2019-03-31 DIAGNOSIS — I110 Hypertensive heart disease with heart failure: Secondary | ICD-10-CM | POA: Diagnosis not present

## 2019-03-31 DIAGNOSIS — I2510 Atherosclerotic heart disease of native coronary artery without angina pectoris: Secondary | ICD-10-CM | POA: Diagnosis not present

## 2019-03-31 DIAGNOSIS — I213 ST elevation (STEMI) myocardial infarction of unspecified site: Secondary | ICD-10-CM

## 2019-04-03 ENCOUNTER — Encounter (HOSPITAL_COMMUNITY): Payer: Medicare Other

## 2019-04-03 ENCOUNTER — Encounter (HOSPITAL_COMMUNITY)
Admission: RE | Admit: 2019-04-03 | Discharge: 2019-04-03 | Disposition: A | Discharge status: Home / Self Care | Payer: Medicare Other | Source: Ambulatory Visit | Attending: Cardiology | Admitting: Cardiology

## 2019-04-03 ENCOUNTER — Other Ambulatory Visit: Payer: Self-pay

## 2019-04-03 DIAGNOSIS — D509 Iron deficiency anemia, unspecified: Secondary | ICD-10-CM | POA: Diagnosis not present

## 2019-04-03 DIAGNOSIS — I2510 Atherosclerotic heart disease of native coronary artery without angina pectoris: Secondary | ICD-10-CM | POA: Diagnosis not present

## 2019-04-03 DIAGNOSIS — I213 ST elevation (STEMI) myocardial infarction of unspecified site: Secondary | ICD-10-CM

## 2019-04-03 DIAGNOSIS — I2102 ST elevation (STEMI) myocardial infarction involving left anterior descending coronary artery: Secondary | ICD-10-CM | POA: Diagnosis not present

## 2019-04-03 DIAGNOSIS — I110 Hypertensive heart disease with heart failure: Secondary | ICD-10-CM | POA: Diagnosis not present

## 2019-04-03 DIAGNOSIS — I447 Left bundle-branch block, unspecified: Secondary | ICD-10-CM | POA: Diagnosis not present

## 2019-04-03 DIAGNOSIS — Z9861 Coronary angioplasty status: Secondary | ICD-10-CM

## 2019-04-03 DIAGNOSIS — I509 Heart failure, unspecified: Secondary | ICD-10-CM | POA: Diagnosis not present

## 2019-04-05 ENCOUNTER — Other Ambulatory Visit: Payer: Self-pay

## 2019-04-05 ENCOUNTER — Encounter (HOSPITAL_COMMUNITY)
Admission: RE | Admit: 2019-04-05 | Discharge: 2019-04-05 | Disposition: A | Discharge status: Home / Self Care | Payer: Medicare Other | Source: Ambulatory Visit | Attending: Cardiology | Admitting: Cardiology

## 2019-04-05 ENCOUNTER — Encounter (HOSPITAL_COMMUNITY): Payer: Medicare Other

## 2019-04-05 DIAGNOSIS — I2102 ST elevation (STEMI) myocardial infarction involving left anterior descending coronary artery: Secondary | ICD-10-CM | POA: Diagnosis not present

## 2019-04-05 DIAGNOSIS — I2510 Atherosclerotic heart disease of native coronary artery without angina pectoris: Secondary | ICD-10-CM | POA: Diagnosis not present

## 2019-04-05 DIAGNOSIS — D509 Iron deficiency anemia, unspecified: Secondary | ICD-10-CM | POA: Diagnosis not present

## 2019-04-05 DIAGNOSIS — I213 ST elevation (STEMI) myocardial infarction of unspecified site: Secondary | ICD-10-CM

## 2019-04-05 DIAGNOSIS — I110 Hypertensive heart disease with heart failure: Secondary | ICD-10-CM | POA: Diagnosis not present

## 2019-04-05 DIAGNOSIS — I447 Left bundle-branch block, unspecified: Secondary | ICD-10-CM | POA: Diagnosis not present

## 2019-04-05 DIAGNOSIS — Z9861 Coronary angioplasty status: Secondary | ICD-10-CM

## 2019-04-05 DIAGNOSIS — I509 Heart failure, unspecified: Secondary | ICD-10-CM | POA: Diagnosis not present

## 2019-04-06 NOTE — Progress Notes (Signed)
Cardiac Individual Treatment Plan  Patient Details  Name: Tracy Marshall. MRN: [id number] Date of Birth: Apr 01, 1951 Referring Provider:     CARDIAC REHAB PHASE II ORIENTATION from 03/14/2019 in [facility]  Referring Provider  Fransico Him, MD      Initial Encounter Date:    CARDIAC REHAB PHASE II ORIENTATION from 03/14/2019 in [facility]  Date  03/14/19      Visit Diagnosis: 07/27/18 STEMI  07/27/18 PTCA LAD & D1  Patient's Home Medications on Admission:  Current Outpatient Medications:  .  acetaminophen (TYLENOL) 500 MG tablet, Take 1,000 mg by mouth every 6 (six) hours as needed for moderate pain., Disp: , Rfl:  .  atorvastatin (LIPITOR) 80 MG tablet, TAKE 1 TABLET BY MOUTH DAILY AT 6PM, Disp: 90 tablet, Rfl: 3 .  Carboxymethylcellul-Glycerin (LUBRICATING EYE DROPS OP), Place 1 drop into both eyes daily as needed (dry eyes)., Disp: , Rfl:  .  clopidogrel (PLAVIX) 75 MG tablet, Take 1 tablet (75 mg total) by mouth daily., Disp: 30 tablet, Rfl: 11 .  colchicine 0.6 MG tablet, Take 0.6 mg by mouth daily as needed (gout flares). , Disp: , Rfl:  .  diazepam (VALIUM) 2 MG tablet, Take 4 mg by mouth at bedtime as needed for anxiety or sedation. , Disp: , Rfl:  .  fenofibrate (TRICOR) 145 MG tablet, Take 1 tablet (145 mg total) by mouth daily. (Patient taking differently: Take 145 mg by mouth every evening. ), Disp: 90 tablet, Rfl: 3 .  ferrous sulfate 325 (65 FE) MG tablet, Take 325 mg by mouth 2 (two) times daily with a meal., Disp: , Rfl:  .  fluticasone (FLONASE) 50 MCG/ACT nasal spray, Place 1-2 sprays into both nostrils daily as needed for allergies. , Disp: , Rfl:  .  gabapentin (NEURONTIN) 300 MG capsule, Take 900 mg by mouth 3 (three) times daily., Disp: , Rfl:  .  nitroGLYCERIN (NITROSTAT) 0.4 MG SL tablet, Place 1 tablet (0.4 mg total) under the tongue every 5 (five) minutes as needed for chest pain., Disp: 15  tablet, Rfl: 3 .  pantoprazole (PROTONIX) 40 MG tablet, Take 40 mg by mouth daily., Disp: , Rfl:  .  ranitidine (ZANTAC) 75 MG tablet, Take 75 mg by mouth 2 (two) times daily as needed for heartburn., Disp: , Rfl:  .  warfarin (COUMADIN) 3 MG tablet, TAKE AS DIRECTED BY COUMADIN CLINIC (Patient taking differently: Take 1.5-3 mg by mouth See admin instructions. 3mg  on Sunday, Tuesday, Thursday and Saturday and 1.5mg  on all other days), Disp: 30 tablet, Rfl: 3  Past Medical History: Past Medical History:  Diagnosis Date  . Anemia    iron  . Arthritis   . Atypical moles    melanomna  . Basal cell carcinoma   . Bilateral carotid artery stenosis 08/03/2014   1-39% right and 40-59% left carotid artery stenosis  . CAD (coronary artery disease), native coronary artery 08/10/2018   S/P anterior STEMI secondary to thrombotic occlusion of the mid LAD and diag s/p PTCA with no stent 07/2018  . Chronic anticoagulation    for mechanical AVR  . Cluster headaches   . Coronary artery disease   . CVA (cerebral vascular accident) ([place]) 07/24/2014    MRI indicating 2 punctate foci of acute infarction.  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   . Diverticulosis   . Dizziness   . Episodic  recurrent vertigo 2002 & 2005   carotid dopplers w no clinically significant stenosis  . Fatty liver    hx of elevated hepatic transaminases-negative workup in 2009 except for U/S suggesting fatty liver  . GERD (gastroesophageal reflux disease)   . Gout   . Heart murmur   . Hyperlipidemia   . Hyperlipidemia LDL goal <70 12/05/2015  . Joint pain   . LBBB (left bundle branch block)   . Melanoma ([place])    hx of melanoma and multiple basal cell carcinomas Dr Tonia Brooms  . PFO (patent foramen ovale) 12/05/2015  . Positive ANA (antinuclear antibody)   . S/P AVR (aortic valve replacement)    mechanical  . TIA (transient ischemic attack)     Tobacco Use: Social  History   Tobacco Use  Smoking Status Never Smoker  Smokeless Tobacco Never Used    Labs: Recent Review Flowsheet Data    Labs for ITP Cardiac and Pulmonary Rehab Latest Ref Rng & Units 12/06/2015 02/26/2016 08/04/2016 07/27/2018 07/29/2018   Cholestrol 0 - 200 mg/dL 202(H) 149 - - 136   LDLCALC 0 - 99 mg/dL 60 59 - - 65   HDL >40 mg/dL 76 67 - - 41   Trlycerides <150 mg/dL 331(H) 115 - - 150(H)   Hemoglobin A1c <5.7 % - - - - -   PHART 7.350 - 7.450 - - - 7.185(LL) -   PCO2ART 32.0 - 48.0 mmHg - - - 43.3 -   HCO3 20.0 - 28.0 mmol/L - - - 16.3(L) -   TCO2 22 - 32 mmol/L - - 24 18(L) -   ACIDBASEDEF 0.0 - 2.0 mmol/L - - - 12.0(H) -   O2SAT % - - - 90.0 -      Capillary Blood Glucose: Lab Results  Component Value Date   GLUCAP 117 (H) 08/04/2016     Exercise Target Goals: Exercise Program Goal: Individual exercise prescription set using results from initial 6 min walk test and THRR while considering  patient's activity barriers and safety.   Exercise Prescription Goal: Starting with aerobic activity 30 plus minutes a day, 3 days per week for initial exercise prescription. Provide home exercise prescription and guidelines that participant acknowledges understanding prior to discharge.  Activity Barriers & Risk Stratification: Activity Barriers & Cardiac Risk Stratification - 03/14/19 1349      Activity Barriers & Cardiac Risk Stratification   Activity Barriers  None    Cardiac Risk Stratification  High       6 Minute Walk: 6 Minute Walk    Row Name 03/14/19 1406         6 Minute Walk   Phase  Initial     Distance  2078 feet     Walk Time  6 minutes     # of Rest Breaks  0     MPH  3.94     METS  4.54     RPE  12     Perceived Dyspnea   0     VO2 Peak  15.89     Symptoms  No     Resting HR  52 bpm     Resting BP  90/50 100/68 recheck BP     Resting Oxygen Saturation   97 %     Exercise Oxygen Saturation  during 6 min walk  98 %     Max Ex. HR  94 bpm     Max  Ex. BP  132/82  2 Minute Post BP  98/68        Oxygen Initial Assessment:   Oxygen Re-Evaluation:   Oxygen Discharge (Final Oxygen Re-Evaluation):   Initial Exercise Prescription: Initial Exercise Prescription - 03/14/19 1500      Date of Initial Exercise RX and Referring Provider   Date  03/14/19    Referring Provider  Fransico Him, MD    Expected Discharge Date  05/12/19      Treadmill   MPH  3.2    Grade  1    Minutes  15    METs  3.89      NuStep   Level  4    SPM  85    Minutes  15    METs  3      Prescription Details   Frequency (times per week)  3    Duration  Progress to 30 minutes of continuous aerobic without signs/symptoms of physical distress      Intensity   THRR 40-80% of Max Heartrate  62-123    Ratings of Perceived Exertion  11-13    Perceived Dyspnea  0-4      Progression   Progression  Continue to progress workloads to maintain intensity without signs/symptoms of physical distress.      Resistance Training   Training Prescription  Yes    Weight  5lbs    Reps  10-15       Perform Capillary Blood Glucose checks as needed.  Exercise Prescription Changes: Exercise Prescription Changes    Row Name 03/20/19 1048 04/03/19 1049           Response to Exercise   Blood Pressure (Admit)  116/61  100/66      Blood Pressure (Exercise)  120/70  118/80      Blood Pressure (Exit)  98/64  98/60      Heart Rate (Admit)  58 bpm  59 bpm      Heart Rate (Exercise)  84 bpm  93 bpm      Heart Rate (Exit)  58 bpm  64 bpm      Rating of Perceived Exertion (Exercise)  11  12      Symptoms  none  none      Comments  Patient tolerated 1st exercise session well  -      Duration  Continue with 30 min of aerobic exercise without signs/symptoms of physical distress.  Continue with 30 min of aerobic exercise without signs/symptoms of physical distress.      Intensity  THRR unchanged  THRR unchanged        Progression   Progression  Continue to progress  workloads to maintain intensity without signs/symptoms of physical distress.  Continue to progress workloads to maintain intensity without signs/symptoms of physical distress.      Average METs  3.5  5.4        Resistance Training   Training Prescription  Yes  Yes      Weight  5lbs  6lbs      Reps  10-15  10-15      Time  10 Minutes  10 Minutes        Interval Training   Interval Training  No  No        Treadmill   MPH  3.2  3.5      Grade  1  3      Minutes  15  15      METs  3.89  5.13        NuStep   Level  4  5      SPM  85  85      Minutes  15  15      METs  3.2  5.7        Home Exercise Plan   Plans to continue exercise at  -  Home (comment) Walking      Frequency  -  Add 4 additional days to program exercise sessions.      Initial Home Exercises Provided  -  03/29/19         Exercise Comments: Exercise Comments    Row Name 03/20/19 1048 03/22/19 1048 03/29/19 1051 04/05/19 1100     Exercise Comments  Patient did well with exercise. Patient updated his personal goals to include weight loss and increase stamina to run once cleared by the cardiologist.  Reviewed METs with patient.  Reviewed home exericse guidelines and goals with patient.  Reviewed METs with patient.       Exercise Goals and Review: Exercise Goals    Row Name 03/14/19 1437             Exercise Goals   Increase Physical Activity  Yes       Intervention  Provide advice, education, support and counseling about physical activity/exercise needs.;Develop an individualized exercise prescription for aerobic and resistive training based on initial evaluation findings, risk stratification, comorbidities and participant's personal goals.       Expected Outcomes  Short Term: Attend rehab on a regular basis to increase amount of physical activity.;Long Term: Exercising regularly at least 3-5 days a week.;Long Term: Add in home exercise to make exercise part of routine and to increase amount of physical  activity.       Increase Strength and Stamina  Yes       Intervention  Provide advice, education, support and counseling about physical activity/exercise needs.;Develop an individualized exercise prescription for aerobic and resistive training based on initial evaluation findings, risk stratification, comorbidities and participant's personal goals.       Expected Outcomes  Short Term: Increase workloads from initial exercise prescription for resistance, speed, and METs.;Short Term: Perform resistance training exercises routinely during rehab and add in resistance training at home;Long Term: Improve cardiorespiratory fitness, muscular endurance and strength as measured by increased METs and functional capacity (6MWT)       Able to understand and use rate of perceived exertion (RPE) scale  Yes       Intervention  Provide education and explanation on how to use RPE scale       Expected Outcomes  Short Term: Able to use RPE daily in rehab to express subjective intensity level;Long Term:  Able to use RPE to guide intensity level when exercising independently       Knowledge and understanding of Target Heart Rate Range (THRR)  Yes       Intervention  Provide education and explanation of THRR including how the numbers were predicted and where they are located for reference       Expected Outcomes  Short Term: Able to state/look up THRR;Long Term: Able to use THRR to govern intensity when exercising independently;Short Term: Able to use daily as guideline for intensity in rehab       Able to check pulse independently  Yes       Intervention  Provide education and demonstration on how to check pulse in carotid and radial arteries.;Review the  importance of being able to check your own pulse for safety during independent exercise       Expected Outcomes  Short Term: Able to explain why pulse checking is important during independent exercise;Long Term: Able to check pulse independently and accurately        Understanding of Exercise Prescription  Yes       Intervention  Provide education, explanation, and written materials on patient's individual exercise prescription       Expected Outcomes  Short Term: Able to explain program exercise prescription;Long Term: Able to explain home exercise prescription to exercise independently          Exercise Goals Re-Evaluation : Exercise Goals Re-Evaluation    [place] Name 03/20/19 1048 03/29/19 1051           Exercise Goal Re-Evaluation   Exercise Goals Review  Increase Physical Activity;Able to understand and use rate of perceived exertion (RPE) scale  Increase Physical Activity;Able to understand and use rate of perceived exertion (RPE) scale;Understanding of Exercise Prescription;Knowledge and understanding of Target Heart Rate Range (THRR);Able to check pulse independently      Comments  Patient able to understand and use RPE scale appropriately. Pt added weight loss and getting cleared by his cardiologist to run to his personal goals.  Reviewed home exercise guidelines with patient including THRR, RPE scale, and endpoints for exercise. Patient is walking 45 minutes, 4 days/week as his mode of home exercise. Pt has an app on his phone to check his heart rate. Pulse coutning handout given.      Expected Outcomes  Increase workloads as tolerated to help improve stamina to run once cleared to do so and to help achieve weight loss.  Increase workloads as tolerated to help improve stamina to run once cleared to do so and to help achieve weight loss.          Discharge Exercise Prescription (Final Exercise Prescription Changes): Exercise Prescription Changes - 04/03/19 1049      Response to Exercise   Blood Pressure (Admit)  100/66    Blood Pressure (Exercise)  118/80    Blood Pressure (Exit)  98/60    Heart Rate (Admit)  59 bpm    Heart Rate (Exercise)  93 bpm    Heart Rate (Exit)  64 bpm    Rating of Perceived Exertion (Exercise)  12    Symptoms  none     Duration  Continue with 30 min of aerobic exercise without signs/symptoms of physical distress.    Intensity  THRR unchanged      Progression   Progression  Continue to progress workloads to maintain intensity without signs/symptoms of physical distress.    Average METs  5.4      Resistance Training   Training Prescription  Yes    Weight  6lbs    Reps  10-15    Time  10 Minutes      Interval Training   Interval Training  No      Treadmill   MPH  3.5    Grade  3    Minutes  15    METs  5.13      NuStep   Level  5    SPM  85    Minutes  15    METs  5.7      Home Exercise Plan   Plans to continue exercise at  Home (comment)   Walking   Frequency  Add 4 additional days to program  exercise sessions.    Initial Home Exercises Provided  03/29/19       Nutrition:  Target Goals: Understanding of nutrition guidelines, daily intake of sodium 1500mg , cholesterol 200mg , calories 30% from fat and 7% or less from saturated fats, daily to have 5 or more servings of fruits and vegetables.  Biometrics: Pre Biometrics - 03/14/19 1347      Pre Biometrics   Height  5\' 10"  (1.778 m)    Weight  83.5 kg    Waist Circumference  37.5 inches    Hip Circumference  40.5 inches    Waist to Hip Ratio  0.93 %    BMI (Calculated)  26.41    Triceps Skinfold  9 mm    % Body Fat  23.6 %    Grip Strength  48.5 kg    Flexibility  14.5 in    Single Leg Stand  30 seconds        Nutrition Therapy Plan and Nutrition Goals:   Nutrition Assessments:   Nutrition Goals Re-Evaluation:   Nutrition Goals Discharge (Final Nutrition Goals Re-Evaluation):   Psychosocial: Target Goals: Acknowledge presence or absence of significant depression and/or stress, maximize coping skills, provide positive support system. Participant is able to verbalize types and ability to use techniques and skills needed for reducing stress and depression.  Initial Review & Psychosocial Screening: Initial Psych  Review & Screening - 03/14/19 1544      Initial Review   Current issues with  None Identified      Family Dynamics   Good Support System?  Yes   Tracy Marshall has his wife for support     Barriers   Psychosocial barriers to participate in program  There are no identifiable barriers or psychosocial needs.      Screening Interventions   Interventions  Encouraged to exercise       Quality of Life Scores: Quality of Life - 03/14/19 1517      Quality of Life   Select  Quality of Life      Quality of Life Scores   Health/Function Pre  23.3 %    Socioeconomic Pre  23.43 %    Psych/Spiritual Pre  22.5 %    Family Pre  24 %    GLOBAL Pre  23.24 %      Scores of 19 and below usually indicate a poorer quality of life in these areas.  A difference of  2-3 points is a clinically meaningful difference.  A difference of 2-3 points in the total score of the Quality of Life Index has been associated with significant improvement in overall quality of life, self-image, physical symptoms, and general health in studies assessing change in quality of life.  PHQ-9: Recent Review Flowsheet Data    Depression screen [facility] 2/9 03/20/2019   Decreased Interest 0   Down, Depressed, Hopeless 0   PHQ - 2 Score 0     Interpretation of Total Score  Total Score Depression Severity:  1-4 = Minimal depression, 5-9 = Mild depression, 10-14 = Moderate depression, 15-19 = Moderately severe depression, 20-27 = Severe depression   Psychosocial Evaluation and Intervention: Psychosocial Evaluation - 03/20/19 1203      Psychosocial Evaluation & Interventions   Interventions  Encouraged to exercise with the program and follow exercise prescription    Comments  No psychosocial interventions necessary.  Tracy Marshall enjoys golfing and traveling.    Expected Outcomes  Tracy Marshall will maintain a positive outlook with good coping skills.  Continue Psychosocial Services   No Follow up required       Psychosocial  Re-Evaluation: Psychosocial Re-Evaluation    [place] Name 03/31/19 1343             Psychosocial Re-Evaluation   Current issues with  None Identified       Comments  No psychosocial interventions necessary.       Expected Outcomes  Tracy Marshall will maintain a positive outlook with good coping skills.       Interventions  Encouraged to attend Cardiac Rehabilitation for the exercise       Continue Psychosocial Services   No Follow up required          Psychosocial Discharge (Final Psychosocial Re-Evaluation): Psychosocial Re-Evaluation - 03/31/19 1343      Psychosocial Re-Evaluation   Current issues with  None Identified    Comments  No psychosocial interventions necessary.    Expected Outcomes  Tracy Marshall will maintain a positive outlook with good coping skills.    Interventions  Encouraged to attend Cardiac Rehabilitation for the exercise    Continue Psychosocial Services   No Follow up required       Vocational Rehabilitation: Provide vocational rehab assistance to qualifying candidates.   Vocational Rehab Evaluation & Intervention: Vocational Rehab - 03/14/19 1549      Initial Vocational Rehab Evaluation & Intervention   Assessment shows need for Vocational Rehabilitation  No       Education: Education Goals: Education classes will be provided on a weekly basis, covering required topics. Participant will state understanding/return demonstration of topics presented.  Learning Barriers/Preferences: Learning Barriers/Preferences - 03/14/19 1355      Learning Barriers/Preferences   Learning Barriers  Sight;Hearing    Learning Preferences  None       Education Topics: Hypertension, Hypertension Reduction -Define heart disease and high blood pressure. Discus how high blood pressure affects the body and ways to reduce high blood pressure.   Exercise and Your Heart -Discuss why it is important to exercise, the FITT principles of exercise, normal and abnormal responses to exercise,  and how to exercise safely.   Angina -Discuss definition of angina, causes of angina, treatment of angina, and how to decrease risk of having angina.   Cardiac Medications -Review what the following cardiac medications are used for, how they affect the body, and side effects that may occur when taking the medications.  Medications include Aspirin, Beta blockers, calcium channel blockers, ACE Inhibitors, angiotensin receptor blockers, diuretics, digoxin, and antihyperlipidemics.   Congestive Heart Failure -Discuss the definition of CHF, how to live with CHF, the signs and symptoms of CHF, and how keep track of weight and sodium intake.   Heart Disease and Intimacy -Discus the effect sexual activity has on the heart, how changes occur during intimacy as we age, and safety during sexual activity.   Smoking Cessation / COPD -Discuss different methods to quit smoking, the health benefits of quitting smoking, and the definition of COPD.   Nutrition I: Fats -Discuss the types of cholesterol, what cholesterol does to the heart, and how cholesterol levels can be controlled.   Nutrition II: Labels -Discuss the different components of food labels and how to read food label   Heart Parts/Heart Disease and PAD -Discuss the anatomy of the heart, the pathway of blood circulation through the heart, and these are affected by heart disease.   Stress I: Signs and Symptoms -Discuss the causes of stress, how stress may lead to anxiety and depression,  and ways to limit stress.   Stress II: Relaxation -Discuss different types of relaxation techniques to limit stress.   Warning Signs of Stroke / TIA -Discuss definition of a stroke, what the signs and symptoms are of a stroke, and how to identify when someone is having stroke.   Knowledge Questionnaire Score: Knowledge Questionnaire Score - 03/14/19 1517      Knowledge Questionnaire Score   Pre Score  21/24       Core Components/Risk  Factors/Patient Goals at Admission: Personal Goals and Risk Factors at Admission - 03/14/19 1549      Core Components/Risk Factors/Patient Goals on Admission    Weight Management  --       Core Components/Risk Factors/Patient Goals Review:  Goals and Risk Factor Review    Row Name 03/20/19 1204 03/31/19 1344           Core Components/Risk Factors/Patient Goals Review   Personal Goals Review  Lipids;Other  -      Review  Pt with few CAD RFs willing to participate in CR.  Tracy Marshall would like to increase his EF and reduce risk for a future CV event.  Pt with few CAD RFs willing to participate in CR.  Tracy Marshall continues to tolerate exercise well with increased workloads.  He feels that he is working towards increasing his stamina.  VSS.      Expected Outcomes  Tracy Marshall will continue to participate in CR exercise, nutrition, and lifestyle modifications.  Tracy Marshall will continue to participate in CR exercise, nutrition, and lifestyle modifications.         Core Components/Risk Factors/Patient Goals at Discharge (Final Review):  Goals and Risk Factor Review - 03/31/19 1344      Core Components/Risk Factors/Patient Goals Review   Review  Pt with few CAD RFs willing to participate in CR.  Tracy Marshall continues to tolerate exercise well with increased workloads.  He feels that he is working towards increasing his stamina.  VSS.    Expected Outcomes  Tracy Marshall will continue to participate in CR exercise, nutrition, and lifestyle modifications.       ITP Comments: ITP Comments    Row Name 03/14/19 1344 03/20/19 1202 03/31/19 1341       ITP Comments  Medical Director- Dr. Fransico Him, MD  Pt started exercise and tolerated it well.  30 Day ITP Review.  Tracy Marshall continues to tolerate exercise well with increased workloads.  He feels that he is working towards increasing his stamina.  VSS.        Comments: See ITP comments

## 2019-04-07 ENCOUNTER — Encounter (HOSPITAL_COMMUNITY)
Admission: RE | Admit: 2019-04-07 | Discharge: 2019-04-07 | Disposition: A | Discharge status: Home / Self Care | Payer: Medicare Other | Source: Ambulatory Visit | Attending: Cardiology | Admitting: Cardiology

## 2019-04-07 ENCOUNTER — Other Ambulatory Visit: Payer: Self-pay

## 2019-04-07 ENCOUNTER — Encounter (HOSPITAL_COMMUNITY): Payer: Medicare Other

## 2019-04-07 DIAGNOSIS — I213 ST elevation (STEMI) myocardial infarction of unspecified site: Secondary | ICD-10-CM

## 2019-04-07 DIAGNOSIS — I447 Left bundle-branch block, unspecified: Secondary | ICD-10-CM | POA: Diagnosis not present

## 2019-04-07 DIAGNOSIS — I2102 ST elevation (STEMI) myocardial infarction involving left anterior descending coronary artery: Secondary | ICD-10-CM | POA: Diagnosis not present

## 2019-04-07 DIAGNOSIS — D509 Iron deficiency anemia, unspecified: Secondary | ICD-10-CM | POA: Diagnosis not present

## 2019-04-07 DIAGNOSIS — I2510 Atherosclerotic heart disease of native coronary artery without angina pectoris: Secondary | ICD-10-CM | POA: Diagnosis not present

## 2019-04-07 DIAGNOSIS — I509 Heart failure, unspecified: Secondary | ICD-10-CM | POA: Diagnosis not present

## 2019-04-07 DIAGNOSIS — Z9861 Coronary angioplasty status: Secondary | ICD-10-CM

## 2019-04-07 DIAGNOSIS — I110 Hypertensive heart disease with heart failure: Secondary | ICD-10-CM | POA: Diagnosis not present

## 2019-04-10 ENCOUNTER — Other Ambulatory Visit: Payer: Self-pay

## 2019-04-10 ENCOUNTER — Encounter (HOSPITAL_COMMUNITY): Payer: Medicare Other

## 2019-04-10 ENCOUNTER — Encounter (HOSPITAL_COMMUNITY)
Admission: RE | Admit: 2019-04-10 | Discharge: 2019-04-10 | Disposition: A | Discharge status: Home / Self Care | Payer: Medicare Other | Source: Ambulatory Visit | Attending: Cardiology | Admitting: Cardiology

## 2019-04-10 DIAGNOSIS — I509 Heart failure, unspecified: Secondary | ICD-10-CM | POA: Diagnosis not present

## 2019-04-10 DIAGNOSIS — I2510 Atherosclerotic heart disease of native coronary artery without angina pectoris: Secondary | ICD-10-CM | POA: Diagnosis not present

## 2019-04-10 DIAGNOSIS — I2102 ST elevation (STEMI) myocardial infarction involving left anterior descending coronary artery: Secondary | ICD-10-CM | POA: Diagnosis not present

## 2019-04-10 DIAGNOSIS — I110 Hypertensive heart disease with heart failure: Secondary | ICD-10-CM | POA: Diagnosis not present

## 2019-04-10 DIAGNOSIS — D509 Iron deficiency anemia, unspecified: Secondary | ICD-10-CM | POA: Diagnosis not present

## 2019-04-10 DIAGNOSIS — I447 Left bundle-branch block, unspecified: Secondary | ICD-10-CM | POA: Diagnosis not present

## 2019-04-10 DIAGNOSIS — Z9861 Coronary angioplasty status: Secondary | ICD-10-CM

## 2019-04-10 DIAGNOSIS — I213 ST elevation (STEMI) myocardial infarction of unspecified site: Secondary | ICD-10-CM

## 2019-04-12 ENCOUNTER — Other Ambulatory Visit: Payer: Self-pay

## 2019-04-12 ENCOUNTER — Encounter (HOSPITAL_COMMUNITY)
Admission: RE | Admit: 2019-04-12 | Discharge: 2019-04-12 | Disposition: A | Discharge status: Home / Self Care | Payer: Medicare Other | Source: Ambulatory Visit | Attending: Cardiology | Admitting: Cardiology

## 2019-04-12 ENCOUNTER — Encounter (HOSPITAL_COMMUNITY): Payer: Medicare Other

## 2019-04-12 DIAGNOSIS — I110 Hypertensive heart disease with heart failure: Secondary | ICD-10-CM | POA: Diagnosis not present

## 2019-04-12 DIAGNOSIS — I213 ST elevation (STEMI) myocardial infarction of unspecified site: Secondary | ICD-10-CM

## 2019-04-12 DIAGNOSIS — I2510 Atherosclerotic heart disease of native coronary artery without angina pectoris: Secondary | ICD-10-CM | POA: Diagnosis not present

## 2019-04-12 DIAGNOSIS — D509 Iron deficiency anemia, unspecified: Secondary | ICD-10-CM | POA: Diagnosis not present

## 2019-04-12 DIAGNOSIS — I509 Heart failure, unspecified: Secondary | ICD-10-CM | POA: Diagnosis not present

## 2019-04-12 DIAGNOSIS — I2102 ST elevation (STEMI) myocardial infarction involving left anterior descending coronary artery: Secondary | ICD-10-CM | POA: Diagnosis not present

## 2019-04-12 DIAGNOSIS — I447 Left bundle-branch block, unspecified: Secondary | ICD-10-CM | POA: Diagnosis not present

## 2019-04-12 DIAGNOSIS — Z9861 Coronary angioplasty status: Secondary | ICD-10-CM

## 2019-04-12 NOTE — Progress Notes (Signed)
Cardiology Office Note   Date:  04/13/2019   ID:  Tracy Malta., DOB 02-Oct-1950, MRN [id number]  PCP:  Orpah Melter, MD  Cardiologist:  Dr. Radford Pax    Chief Complaint  Patient presents with   Bradycardia    junct rhythnm with cardiac rehab      History of Present Illness: Tracy Swiney. is a 68 y.o. male who presents for Junct Rhythm he had in rehab with HR in slightly slower than SR and P wave flattened then inverted.  He was asymptomatic.  On no rate slowing meds.   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  He has a hx of bicuspid aortic stenosis and underwent mechanical aortic valve replacement in 2001.Since that time he has had recurrent TIA/stroke episodes. Initially he had slurred speech a few weeks after his valve replacement and he was felt to have had a TIA. He had some recurrent neurologic symptoms in 2015/2016 in the setting of an illness and MRI imaging of the brain was done.This demonstrated multiple small infarcts, with subacute infarcts in the right insula and punctate infarcts in the left frontal cortex as well as old infarcts in the right temporoparietal region.Findings were felt to be consistent with a central embolic source. A TEE at that time did not show any plaque in the aortic arch or any thrombus on the patient's mechanical valve. He was noted to have a PFO. Recommendations were made to continue oral anticoagulation as well as antiplatelet therapy with aspirin. The patient then presented in February 2018 with acute onset left-sided weakness, numbness, and vision changes he was found to have a proximal right MCA infarct with large vessel occlusion at the right ICA terminus extending to the right M1 and A1 segments.His INR was 2.3. He was managed with mechanical embolectomy and improved immediately. A surface  echocardiogram demonstrated a PFO but no other significant abnormalities. He was bridged with Lovenox until his INR was back in the range of 2.5-3.5. When he was seen back in April 2018, a venous duplex study was performed because of his recurrent stroke and known PFO. Despite therapeutic anticoagulation, he was diagnosed with chronic DVT in the right mid femoral vein and age-indeterminate DVT in the right above-knee popliteal vein.   He was seen by Dr. Burt Knack in consultation forconsideration of transcatheter PFO closure. Hereviewed his TEE from 2016 which demonstrated a moderate to large PFO with strongly positive bubble study demonstrating R--->L shunt.Dr. Burt Knack felt like he would be a candidate for PFO closure given his known PFOwith recurrent TIA/stroke in patient with mechanical aortic prosthesis on long-term anticoagulation and documented DVT.This was set up in 07/2017 but then cancelled because pre admission labs revealed a Hg of 7. He was sent to Dr.Ganemwith GIand underwent an extensive GI work up. No etiology of this anemia was found, but he did have polyps that required removal.His hemoglobinimprovedto ~12and hewasset up for endoscopy and polypectomy on 5/28followed by PFO closure the following day 5/29. Coumadinwasbridged with Lovenox.   He underwent successful transcatheter PFO closure using a 25 mm Amplatzer PFO Occluderon 11/17/17. Follow up limited echo showed no obvious PFO by color doppler. He was started on ASA 81 mg daily to continued indefinitely if tolerated. Coumadin was resumed.   He was then readmitted 6/4-11/28/17 for near syncope and melena. He was transfused andunderwent upper endoscopyand underwentEPI/saline submucosally injected  with good blanching.Discharge Hg 10.6.  He was admitted 2/8-07/30/18 for anterior STEMI secondary to thrombotic occlusion of the mid LAD and Diagonal branch. Teated both branches with balloon angioplasty and no stenting. Felt to be  thrombotic vs embolic as opposed to plaque rupture.Troponin peaked >65. He was started on triple therapy x 1 month with ASA, plavix, coumadin. Echo showed EF 25-30% with akinesis of the distal septum and apex. Lipitor was increased to 76m daily. He then underwent TEE outpt to rule out source of embolism which showed stable mechanical AVR with mild MR, moderate to severe LV dysfunction with EF 30-35%, mild to moderate MR and mildly dilated ascending aorta at 75mm. Medical therapy for ischemic CM has been limited by hypotension. He was referred to hematology for hypercoagulable work up given recurrent issues with thrombosis. Hypercoagulable workup was unrevealing. He continues to be on Coumadin with a recommendation of being at an INR level of 3-3.5. Dr. Irene Limbo felt his mechanical valve in the setting of INR fluctuations was the most likely explanation for his STEMI.   He was seen back in structural heart clinic for PFO follow-up and was doing well.   A repeat echo was done which showed persistence of his LV dysfunction with EF 30 to 35%. His ascending aorta was stable at 43 mm.   Had telehealth visit with Dr. Radford Pax 01/2019  Echo 01/26/19  with moderately reduced LVF with EF 30-35% with no evidence of LV thrombus. Moderately dilated ascending aorta at 74mm and stable AVR.   Dr. Rayann Heman felt that given his need for high-dose warfarin and his  increased risk for pocket hematoma during the procedure, it was too risky to hold or decrease Coumadin given that he has had an MI due to acute thrombotic events on lower doses of Coumadin. Repeat echo in July showed EF 35-40% and limited echo in August was 30-35%.  Dr. Radford Pax personally reviewed the echo from August and feel that the LVF is higher and likely 35-40%.  We again discussed the findings of the echo and the risks associated with pocket bleed if ICD done on anticoagulation and risk of recurrent CVA if anticoagulation held for ICD.  Given that EF is more  likely at least 35% ICD would not be indicated at this time.  Cannot get MRI due to mechanical AVR.  Patient agrees with plan.  Unfortunately his BP is too soft for BB/ACE/ARB/spiro.  He is able to exercise without any problems and will continue in Cardiac rehab.  I have recommended that he stick to brisk walking and not running.   Today after junct. Rhythm on EKG pt is being seen. EKG with SB at 51 LBBB that is chronic no acute changes otherwise.  Is able to exercise without any issues.  No chest pain and no SOB.  He does brisk walking to light running.  He is doing well in cardiac rehab and brought papers today, BP low is 98 systolic low HR 50 and pk 114. No bleeding in stool or urine.  Wants to know when he can run again.    Past Medical History:  Diagnosis Date   Anemia    iron   Arthritis    Atypical moles    melanomna   Basal cell carcinoma    Bilateral carotid artery stenosis 08/03/2014   1-39% right and 40-59% left carotid artery stenosis   CAD (coronary artery disease), native coronary artery 08/10/2018   S/P anterior STEMI secondary to thrombotic occlusion of the  mid LAD and diag s/p PTCA with no stent 07/2018   Chronic anticoagulation    for mechanical AVR   Cluster headaches    Coronary artery disease    CVA (cerebral vascular accident) ([place]) 07/24/2014    MRI indicating 2 punctate foci of acute infarction.  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    Diverticulosis    Dizziness    Episodic recurrent vertigo 2002 & 2005   carotid dopplers w no clinically significant stenosis   Fatty liver    hx of elevated hepatic transaminases-negative workup in 2009 except for U/S suggesting fatty liver   GERD (gastroesophageal reflux disease)    Gout    Heart murmur    Hyperlipidemia    Hyperlipidemia LDL goal <70 12/05/2015   Joint pain    LBBB (left bundle branch block)    Melanoma (HCC)    hx of  melanoma and multiple basal cell carcinomas Dr Tonia Brooms   PFO (patent foramen ovale) 12/05/2015   Positive ANA (antinuclear antibody)    S/P AVR (aortic valve replacement)    mechanical   TIA (transient ischemic attack)     Past Surgical History:  Procedure Laterality Date   APPENDECTOMY     CARDIAC CATHETERIZATION     CARDIAC VALVE REPLACEMENT     mechanical   CHOLECYSTECTOMY     CORONARY ANGIOGRAPHY N/A 07/27/2018   Procedure: CORONARY ANGIOGRAPHY (CATH LAB);  Surgeon: Burnell Blanks, MD;  Location: [facility];  Service: Cardiovascular;  Laterality: N/A;   CORONARY/GRAFT ACUTE MI REVASCULARIZATION N/A 07/27/2018   Procedure: CORONARY/GRAFT ACUTE MI REVASCULARIZATION;  Surgeon: Burnell Blanks, MD;  Location: [facility];  Service: Cardiovascular;  Laterality: N/A;   DENTAL SURGERY Left bone graft and extraction   ESOPHAGOGASTRODUODENOSCOPY (EGD) WITH PROPOFOL N/A 11/16/2017   Procedure: ESOPHAGOGASTRODUODENOSCOPY (EGD) WITH PROPOFOL;  Surgeon: Wonda Horner, MD;  Location: [facility];  Service: Endoscopy;  Laterality: N/A;   ESOPHAGOGASTRODUODENOSCOPY (EGD) WITH PROPOFOL N/A 11/25/2017   Procedure: ESOPHAGOGASTRODUODENOSCOPY (EGD) WITH PROPOFOL;  Surgeon: Wilford Corner, MD;  Location: [facility];  Service: Endoscopy;  Laterality: N/A;   MELANOMA EXCISION     x3   PATENT FORAMEN OVALE(PFO) CLOSURE N/A 11/17/2017   Procedure: PATENT FORAMEN OVALE (PFO) CLOSURE;  Surgeon: Sherren Mocha, MD;  Location: [facility];  Service: Cardiovascular;  Laterality: N/A;   POLYPECTOMY  11/16/2017   Procedure: POLYPECTOMY;  Surgeon: Wonda Horner, MD;  Location: [facility];  Service: Endoscopy;;   SUBMUCOSAL INJECTION  11/25/2017   Procedure: SUBMUCOSAL INJECTION of epinephrine;  Surgeon: Wilford Corner, MD;  Location: [facility];  Service: Endoscopy;;   TEE WITHOUT CARDIOVERSION N/A 07/31/2014   Procedure: TRANSESOPHAGEAL ECHOCARDIOGRAM  (TEE);  Surgeon: Sueanne Margarita, MD;  Location: [facility];  Service: Cardiovascular;  Laterality: N/A;   TEE WITHOUT CARDIOVERSION N/A 10/01/2014   Procedure: TRANSESOPHAGEAL ECHOCARDIOGRAM (TEE);  Surgeon: Lelon Perla, MD;  Location: [facility];  Service: Cardiovascular;  Laterality: N/A;   TEE WITHOUT CARDIOVERSION N/A 08/05/2018   Procedure: TRANSESOPHAGEAL ECHOCARDIOGRAM (TEE);  Surgeon: Elouise Munroe, MD;  Location: [facility];  Service: Cardiology;  Laterality: N/A;     Current Outpatient Medications  Medication Sig Dispense Refill   acetaminophen (TYLENOL) 500 MG tablet Take 1,000 mg by mouth every 6 (six) hours as needed for moderate pain.     atorvastatin (LIPITOR) 80 MG tablet TAKE 1 TABLET BY MOUTH DAILY AT 6PM 90 tablet  3   Carboxymethylcellul-Glycerin (LUBRICATING EYE DROPS OP) Place 1 drop into both eyes daily as needed (dry eyes).     clopidogrel (PLAVIX) 75 MG tablet Take 1 tablet (75 mg total) by mouth daily. 30 tablet 11   colchicine 0.6 MG tablet Take 0.6 mg by mouth daily as needed (gout flares).      diazepam (VALIUM) 2 MG tablet Take 4 mg by mouth at bedtime as needed for anxiety or sedation.      fenofibrate (TRICOR) 145 MG tablet Take 1 tablet (145 mg total) by mouth daily. 90 tablet 3   ferrous sulfate 325 (65 FE) MG tablet Take 325 mg by mouth 2 (two) times daily with a meal.     fluticasone (FLONASE) 50 MCG/ACT nasal spray Place 1-2 sprays into both nostrils daily as needed for allergies.      gabapentin (NEURONTIN) 300 MG capsule Take 900 mg by mouth 3 (three) times daily.     methocarbamol (ROBAXIN) 500 MG tablet Take 500 mg by mouth as needed.     nitroGLYCERIN (NITROSTAT) 0.4 MG SL tablet Place 1 tablet (0.4 mg total) under the tongue every 5 (five) minutes as needed for chest pain. 15 tablet 3   pantoprazole (PROTONIX) 40 MG tablet Take 40 mg by mouth daily.     ranitidine (ZANTAC) 75 MG tablet Take 75 mg by mouth 2 (two) times  daily as needed for heartburn.     warfarin (COUMADIN) 3 MG tablet TAKE AS DIRECTED BY COUMADIN CLINIC 30 tablet 3   No current facility-administered medications for this visit.     Allergies:   Patient has no known allergies.    Social History:  The patient  reports that he has never smoked. He has never used smokeless tobacco. He reports current alcohol use. He reports that he does not use drugs.   Family History:  The patient's family history includes Cancer in his father and mother; Melanoma in an other family member; Psoriasis in an other family member; Scoliosis in his sister; Valvular heart disease in his brother.    ROS:  General:no colds or fevers, no weight changes Skin:no rashes or ulcers HEENT:no blurred vision, no congestion CV:see HPI PUL:see HPI GI:no diarrhea constipation or melena, no indigestion GU:no hematuria, no dysuria MS:no joint pain, no claudication Neuro:no syncope, no lightheadedness Endo:no diabetes, no thyroid disease  Wt Readings from Last 3 Encounters:  04/13/19 185 lb 12.8 oz (84.3 kg)  03/14/19 184 lb 1.4 oz (83.5 kg)  02/14/19 182 lb (82.6 kg)     PHYSICAL EXAM: VS:  BP 108/60    Pulse 60    Ht 5\' 10"  (1.778 m)    Wt 185 lb 12.8 oz (84.3 kg)    SpO2 99%    BMI 26.66 kg/m  , BMI Body mass index is 26.66 kg/m. General:Pleasant affect, NAD Skin:Warm and dry, brisk capillary refill HEENT:normocephalic, sclera clear, mucus membranes moist Neck:supple, no JVD, no bruits  Heart:S1S2 RRR without murmur, + click of valve, no gallup, rub or click Lungs:clear without rales, rhonchi, or wheezes [id number], non tender, + BS, do not palpate liver spleen or masses Ext:no lower ext edema, 2+ pedal pulses, 2+ radial pulses Neuro:alert and oriented X 3, MAE, follows commands, + facial symmetry    EKG:  EKG is ordered today. The ekg ordered today demonstrates sinus brady at 51 and LBBB. No acute changes.   Recent Labs: 07/29/2018: Magnesium  1.9 08/12/2018: ALT 25; BUN 19; Creatinine 1.17; Hemoglobin  14.5; Platelets 336; Potassium 4.0; Sodium 139    Lipid Panel    Component Value Date/Time   CHOL 136 07/29/2018 0322   TRIG 150 (H) 07/29/2018 0322   HDL 41 07/29/2018 0322   CHOLHDL 3.3 07/29/2018 0322   VLDL 30 07/29/2018 0322   LDLCALC 65 07/29/2018 0322       Other studies Reviewed: Additional studies/ records that were reviewed today include: . See above  ASSESSMENT AND PLAN:  1.  Junctional rhythm with P wave going from upright to flat to inverted.P wave and not much change in rate.  SB today - asymptomatic reviewed symptoms if an issue.  He would call us.  On no med to cause.  2.  Hx of mechanical AVR and stable valve on echo in August.  On SOB, on plavix and warfarin.  3.  CAD with hx of MI, with thrombotic occlusion of mLAD and diag and underwent PTCA.  Now off ASA but continue plavix and warfarin.  No angina.  4.  PFO post closure.  Doing well. On warfarin and plavix.  5.  HLD for labs per Dr. Radford Pax.  On statin.   6. Thoracic aortic aneurysm stable followed by Dr. Roxan Hockey.    Follow up with Dr. Radford Pax in 4 months.     Current medicines are reviewed with the patient today.  The patient Has no concerns regarding medicines.  The following changes have been made:  See above Labs/ tests ordered today include:see above  Disposition:   FU:  see above  Signed, Cecilie Kicks, NP  04/13/2019 8:46 AM    [address] [place] [address], [place], [place], [place] Phone: [phone]; Fax: [phone]

## 2019-04-13 ENCOUNTER — Encounter: Payer: Self-pay | Admitting: Cardiology

## 2019-04-13 ENCOUNTER — Ambulatory Visit (INDEPENDENT_AMBULATORY_CARE_PROVIDER_SITE_OTHER): Payer: Medicare Other | Admitting: Cardiology

## 2019-04-13 VITALS — BP 108/60 | HR 60 | Ht 70.0 in | Wt 185.8 lb

## 2019-04-13 DIAGNOSIS — I712 Thoracic aortic aneurysm, without rupture, unspecified: Secondary | ICD-10-CM

## 2019-04-13 DIAGNOSIS — I2510 Atherosclerotic heart disease of native coronary artery without angina pectoris: Secondary | ICD-10-CM | POA: Diagnosis not present

## 2019-04-13 DIAGNOSIS — Q2112 Patent foramen ovale: Secondary | ICD-10-CM

## 2019-04-13 DIAGNOSIS — Z952 Presence of prosthetic heart valve: Secondary | ICD-10-CM

## 2019-04-13 DIAGNOSIS — Q211 Atrial septal defect: Secondary | ICD-10-CM

## 2019-04-13 DIAGNOSIS — I255 Ischemic cardiomyopathy: Secondary | ICD-10-CM | POA: Diagnosis not present

## 2019-04-13 DIAGNOSIS — I498 Other specified cardiac arrhythmias: Secondary | ICD-10-CM | POA: Diagnosis not present

## 2019-04-13 NOTE — Patient Instructions (Signed)
Medication Instructions:   Your physician recommends that you continue on your current medications as directed. Please refer to the Current Medication list given to you today.   If you need a refill on your cardiac medications before your next appointment, please call your pharmacy  Lab Work:  None ordered today  Testing/Procedures:  None ordered today  Follow-Up: At [facility], you and your health needs are our priority.  As part of our continuing mission to provide you with exceptional heart care, we have created designated Provider Care Teams.  These Care Teams include your primary Cardiologist (physician) and Advanced Practice Providers (APPs -  Physician Assistants and Nurse Practitioners) who all work together to provide you with the care you need, when you need it.  Your next appointment:   4 months  The format for your next appointment:   In Person  Provider:   Fransico Him, MD

## 2019-04-14 ENCOUNTER — Other Ambulatory Visit: Payer: Self-pay

## 2019-04-14 ENCOUNTER — Encounter (HOSPITAL_COMMUNITY)
Admission: RE | Admit: 2019-04-14 | Discharge: 2019-04-14 | Disposition: A | Discharge status: Home / Self Care | Payer: Medicare Other | Source: Ambulatory Visit | Attending: Cardiology | Admitting: Cardiology

## 2019-04-14 ENCOUNTER — Encounter (HOSPITAL_COMMUNITY): Payer: Medicare Other

## 2019-04-14 DIAGNOSIS — D509 Iron deficiency anemia, unspecified: Secondary | ICD-10-CM | POA: Diagnosis not present

## 2019-04-14 DIAGNOSIS — I110 Hypertensive heart disease with heart failure: Secondary | ICD-10-CM | POA: Diagnosis not present

## 2019-04-14 DIAGNOSIS — I447 Left bundle-branch block, unspecified: Secondary | ICD-10-CM | POA: Diagnosis not present

## 2019-04-14 DIAGNOSIS — Z9861 Coronary angioplasty status: Secondary | ICD-10-CM

## 2019-04-14 DIAGNOSIS — I509 Heart failure, unspecified: Secondary | ICD-10-CM | POA: Diagnosis not present

## 2019-04-14 DIAGNOSIS — I2102 ST elevation (STEMI) myocardial infarction involving left anterior descending coronary artery: Secondary | ICD-10-CM | POA: Diagnosis not present

## 2019-04-14 DIAGNOSIS — I213 ST elevation (STEMI) myocardial infarction of unspecified site: Secondary | ICD-10-CM

## 2019-04-14 DIAGNOSIS — I2510 Atherosclerotic heart disease of native coronary artery without angina pectoris: Secondary | ICD-10-CM | POA: Diagnosis not present

## 2019-04-17 ENCOUNTER — Encounter (HOSPITAL_COMMUNITY): Payer: Medicare Other

## 2019-04-17 ENCOUNTER — Other Ambulatory Visit: Payer: Self-pay

## 2019-04-17 ENCOUNTER — Encounter (HOSPITAL_COMMUNITY)
Admission: RE | Admit: 2019-04-17 | Discharge: 2019-04-17 | Disposition: A | Discharge status: Home / Self Care | Payer: Medicare Other | Source: Ambulatory Visit | Attending: Cardiology | Admitting: Cardiology

## 2019-04-17 ENCOUNTER — Telehealth: Payer: Self-pay | Admitting: *Deleted

## 2019-04-17 DIAGNOSIS — Z9861 Coronary angioplasty status: Secondary | ICD-10-CM

## 2019-04-17 DIAGNOSIS — I429 Cardiomyopathy, unspecified: Secondary | ICD-10-CM

## 2019-04-17 DIAGNOSIS — D509 Iron deficiency anemia, unspecified: Secondary | ICD-10-CM | POA: Diagnosis not present

## 2019-04-17 DIAGNOSIS — I2510 Atherosclerotic heart disease of native coronary artery without angina pectoris: Secondary | ICD-10-CM | POA: Diagnosis not present

## 2019-04-17 DIAGNOSIS — I509 Heart failure, unspecified: Secondary | ICD-10-CM | POA: Diagnosis not present

## 2019-04-17 DIAGNOSIS — I110 Hypertensive heart disease with heart failure: Secondary | ICD-10-CM | POA: Diagnosis not present

## 2019-04-17 DIAGNOSIS — I447 Left bundle-branch block, unspecified: Secondary | ICD-10-CM | POA: Diagnosis not present

## 2019-04-17 DIAGNOSIS — I2102 ST elevation (STEMI) myocardial infarction involving left anterior descending coronary artery: Secondary | ICD-10-CM | POA: Diagnosis not present

## 2019-04-17 DIAGNOSIS — I213 ST elevation (STEMI) myocardial infarction of unspecified site: Secondary | ICD-10-CM

## 2019-04-17 NOTE — Telephone Encounter (Signed)
Call placed to pt re: phone note below. Pt has been made aware that we will need to repeat 2D Echo before deciding upon running. Order has been placed and pt aware someone will contact him to schedule.  Pt grateful for the call.

## 2019-04-17 NOTE — Telephone Encounter (Signed)
-----   Message from Isaiah Serge, NP sent at 04/13/2019  5:02 PM EDT ----- On [name], please let pt know Dr. Radford Pax wants to repeat echo before deciding about running. Hope that it has improved.  So 2D echo for cardiomyopathy. Thanks.  ----- Message ----- From: Sueanne Margarita, MD Sent: 04/13/2019   3:56 PM EDT To: Isaiah Serge, NP  Can we repeat an echo first to see if LVF has improved  Traci ----- Message ----- From: Isaiah Serge, NP Sent: 04/13/2019  10:23 AM EDT To: Sueanne Margarita, MD  Hi, saw [name] with hx of CVA, Mechanical AVR, NSTEMI due to clot, closure of PFO and thoracic aneurysm - he was stable but he wants to know when he can run again, not hard running and he does a loop near his home.  Thanks.

## 2019-04-19 ENCOUNTER — Encounter (HOSPITAL_COMMUNITY)
Admission: RE | Admit: 2019-04-19 | Discharge: 2019-04-19 | Disposition: A | Discharge status: Home / Self Care | Payer: Medicare Other | Source: Ambulatory Visit | Attending: Cardiology | Admitting: Cardiology

## 2019-04-19 ENCOUNTER — Encounter (HOSPITAL_COMMUNITY): Payer: Medicare Other

## 2019-04-19 ENCOUNTER — Other Ambulatory Visit: Payer: Self-pay

## 2019-04-19 DIAGNOSIS — I509 Heart failure, unspecified: Secondary | ICD-10-CM | POA: Diagnosis not present

## 2019-04-19 DIAGNOSIS — I447 Left bundle-branch block, unspecified: Secondary | ICD-10-CM | POA: Diagnosis not present

## 2019-04-19 DIAGNOSIS — I2102 ST elevation (STEMI) myocardial infarction involving left anterior descending coronary artery: Secondary | ICD-10-CM | POA: Diagnosis not present

## 2019-04-19 DIAGNOSIS — Z9861 Coronary angioplasty status: Secondary | ICD-10-CM

## 2019-04-19 DIAGNOSIS — I213 ST elevation (STEMI) myocardial infarction of unspecified site: Secondary | ICD-10-CM

## 2019-04-19 DIAGNOSIS — I2510 Atherosclerotic heart disease of native coronary artery without angina pectoris: Secondary | ICD-10-CM | POA: Diagnosis not present

## 2019-04-19 DIAGNOSIS — D509 Iron deficiency anemia, unspecified: Secondary | ICD-10-CM | POA: Diagnosis not present

## 2019-04-19 DIAGNOSIS — I110 Hypertensive heart disease with heart failure: Secondary | ICD-10-CM | POA: Diagnosis not present

## 2019-04-21 ENCOUNTER — Encounter (HOSPITAL_COMMUNITY): Payer: Medicare Other

## 2019-04-21 ENCOUNTER — Other Ambulatory Visit: Payer: Self-pay

## 2019-04-21 ENCOUNTER — Encounter (HOSPITAL_COMMUNITY)
Admission: RE | Admit: 2019-04-21 | Discharge: 2019-04-21 | Disposition: A | Discharge status: Home / Self Care | Payer: Medicare Other | Source: Ambulatory Visit | Attending: Cardiology | Admitting: Cardiology

## 2019-04-21 VITALS — Ht 70.0 in | Wt 186.3 lb

## 2019-04-21 DIAGNOSIS — I213 ST elevation (STEMI) myocardial infarction of unspecified site: Secondary | ICD-10-CM

## 2019-04-21 DIAGNOSIS — I2102 ST elevation (STEMI) myocardial infarction involving left anterior descending coronary artery: Secondary | ICD-10-CM | POA: Diagnosis not present

## 2019-04-21 DIAGNOSIS — I2510 Atherosclerotic heart disease of native coronary artery without angina pectoris: Secondary | ICD-10-CM | POA: Diagnosis not present

## 2019-04-21 DIAGNOSIS — D509 Iron deficiency anemia, unspecified: Secondary | ICD-10-CM | POA: Diagnosis not present

## 2019-04-21 DIAGNOSIS — I110 Hypertensive heart disease with heart failure: Secondary | ICD-10-CM | POA: Diagnosis not present

## 2019-04-21 DIAGNOSIS — I447 Left bundle-branch block, unspecified: Secondary | ICD-10-CM | POA: Diagnosis not present

## 2019-04-21 DIAGNOSIS — Z9861 Coronary angioplasty status: Secondary | ICD-10-CM

## 2019-04-21 DIAGNOSIS — I509 Heart failure, unspecified: Secondary | ICD-10-CM | POA: Diagnosis not present

## 2019-04-24 ENCOUNTER — Encounter (HOSPITAL_COMMUNITY): Payer: Medicare Other

## 2019-04-24 ENCOUNTER — Encounter (HOSPITAL_COMMUNITY)
Admission: RE | Admit: 2019-04-24 | Discharge: 2019-04-24 | Disposition: A | Discharge status: Home / Self Care | Payer: Medicare Other | Source: Ambulatory Visit | Attending: Cardiology | Admitting: Cardiology

## 2019-04-24 ENCOUNTER — Other Ambulatory Visit: Payer: Self-pay

## 2019-04-24 DIAGNOSIS — D509 Iron deficiency anemia, unspecified: Secondary | ICD-10-CM | POA: Insufficient documentation

## 2019-04-24 DIAGNOSIS — Z8673 Personal history of transient ischemic attack (TIA), and cerebral infarction without residual deficits: Secondary | ICD-10-CM | POA: Insufficient documentation

## 2019-04-24 DIAGNOSIS — Z79899 Other long term (current) drug therapy: Secondary | ICD-10-CM | POA: Insufficient documentation

## 2019-04-24 DIAGNOSIS — I110 Hypertensive heart disease with heart failure: Secondary | ICD-10-CM | POA: Insufficient documentation

## 2019-04-24 DIAGNOSIS — J449 Chronic obstructive pulmonary disease, unspecified: Secondary | ICD-10-CM | POA: Insufficient documentation

## 2019-04-24 DIAGNOSIS — Z7901 Long term (current) use of anticoagulants: Secondary | ICD-10-CM | POA: Diagnosis not present

## 2019-04-24 DIAGNOSIS — I2510 Atherosclerotic heart disease of native coronary artery without angina pectoris: Secondary | ICD-10-CM | POA: Diagnosis not present

## 2019-04-24 DIAGNOSIS — R011 Cardiac murmur, unspecified: Secondary | ICD-10-CM | POA: Insufficient documentation

## 2019-04-24 DIAGNOSIS — I213 ST elevation (STEMI) myocardial infarction of unspecified site: Secondary | ICD-10-CM

## 2019-04-24 DIAGNOSIS — Z9861 Coronary angioplasty status: Secondary | ICD-10-CM | POA: Insufficient documentation

## 2019-04-24 DIAGNOSIS — E785 Hyperlipidemia, unspecified: Secondary | ICD-10-CM | POA: Insufficient documentation

## 2019-04-24 DIAGNOSIS — F17210 Nicotine dependence, cigarettes, uncomplicated: Secondary | ICD-10-CM | POA: Diagnosis not present

## 2019-04-24 DIAGNOSIS — K219 Gastro-esophageal reflux disease without esophagitis: Secondary | ICD-10-CM | POA: Insufficient documentation

## 2019-04-24 DIAGNOSIS — M109 Gout, unspecified: Secondary | ICD-10-CM | POA: Diagnosis not present

## 2019-04-24 DIAGNOSIS — Z7902 Long term (current) use of antithrombotics/antiplatelets: Secondary | ICD-10-CM | POA: Diagnosis not present

## 2019-04-24 DIAGNOSIS — M1990 Unspecified osteoarthritis, unspecified site: Secondary | ICD-10-CM | POA: Insufficient documentation

## 2019-04-24 DIAGNOSIS — I447 Left bundle-branch block, unspecified: Secondary | ICD-10-CM | POA: Insufficient documentation

## 2019-04-24 DIAGNOSIS — I2102 ST elevation (STEMI) myocardial infarction involving left anterior descending coronary artery: Secondary | ICD-10-CM | POA: Insufficient documentation

## 2019-04-24 DIAGNOSIS — K760 Fatty (change of) liver, not elsewhere classified: Secondary | ICD-10-CM | POA: Diagnosis not present

## 2019-04-24 DIAGNOSIS — I509 Heart failure, unspecified: Secondary | ICD-10-CM | POA: Insufficient documentation

## 2019-04-25 ENCOUNTER — Ambulatory Visit (HOSPITAL_COMMUNITY): Payer: Medicare Other | Attending: Cardiology

## 2019-04-25 DIAGNOSIS — I429 Cardiomyopathy, unspecified: Secondary | ICD-10-CM | POA: Insufficient documentation

## 2019-04-25 MED ORDER — PERFLUTREN LIPID MICROSPHERE
1.0000 mL | INTRAVENOUS | Status: AC | PRN
  Administered 2019-04-25: 2 mL via INTRAVENOUS

## 2019-04-26 ENCOUNTER — Encounter (HOSPITAL_COMMUNITY)
Admission: RE | Admit: 2019-04-26 | Discharge: 2019-04-26 | Disposition: A | Discharge status: Home / Self Care | Payer: Medicare Other | Source: Ambulatory Visit | Attending: Cardiology | Admitting: Cardiology

## 2019-04-26 ENCOUNTER — Encounter (HOSPITAL_COMMUNITY): Payer: Medicare Other

## 2019-04-26 ENCOUNTER — Other Ambulatory Visit: Payer: Self-pay

## 2019-04-26 DIAGNOSIS — I509 Heart failure, unspecified: Secondary | ICD-10-CM | POA: Diagnosis not present

## 2019-04-26 DIAGNOSIS — Z9861 Coronary angioplasty status: Secondary | ICD-10-CM

## 2019-04-26 DIAGNOSIS — D509 Iron deficiency anemia, unspecified: Secondary | ICD-10-CM | POA: Diagnosis not present

## 2019-04-26 DIAGNOSIS — I2102 ST elevation (STEMI) myocardial infarction involving left anterior descending coronary artery: Secondary | ICD-10-CM | POA: Diagnosis not present

## 2019-04-26 DIAGNOSIS — I2510 Atherosclerotic heart disease of native coronary artery without angina pectoris: Secondary | ICD-10-CM | POA: Diagnosis not present

## 2019-04-26 DIAGNOSIS — I447 Left bundle-branch block, unspecified: Secondary | ICD-10-CM | POA: Diagnosis not present

## 2019-04-26 DIAGNOSIS — I213 ST elevation (STEMI) myocardial infarction of unspecified site: Secondary | ICD-10-CM

## 2019-04-26 DIAGNOSIS — I110 Hypertensive heart disease with heart failure: Secondary | ICD-10-CM | POA: Diagnosis not present

## 2019-04-27 ENCOUNTER — Telehealth: Payer: Self-pay | Admitting: *Deleted

## 2019-04-27 NOTE — Telephone Encounter (Signed)
-----   Message from Isaiah Serge, NP sent at 04/27/2019  7:51 AM EST ----- So on echo about the same, some improvement in pump action of heart but Dr Radford Pax still wanted him to hold off on running.  That is why we moved appt up so they could discuss after cardiac rehab is done.

## 2019-04-27 NOTE — Telephone Encounter (Signed)
Call placed to pt re: Echocardiogram results, left a message for pt to call back.

## 2019-04-28 ENCOUNTER — Encounter (HOSPITAL_COMMUNITY): Payer: Medicare Other

## 2019-04-28 ENCOUNTER — Encounter (HOSPITAL_COMMUNITY)
Admission: RE | Admit: 2019-04-28 | Discharge: 2019-04-28 | Disposition: A | Discharge status: Home / Self Care | Payer: Medicare Other | Source: Ambulatory Visit | Attending: Cardiology | Admitting: Cardiology

## 2019-04-28 ENCOUNTER — Other Ambulatory Visit: Payer: Self-pay

## 2019-04-28 VITALS — BP 104/60 | HR 70 | Temp 96.8°F | Ht 70.0 in | Wt 185.0 lb

## 2019-04-28 DIAGNOSIS — I2102 ST elevation (STEMI) myocardial infarction involving left anterior descending coronary artery: Secondary | ICD-10-CM | POA: Diagnosis not present

## 2019-04-28 DIAGNOSIS — D509 Iron deficiency anemia, unspecified: Secondary | ICD-10-CM | POA: Diagnosis not present

## 2019-04-28 DIAGNOSIS — Z9861 Coronary angioplasty status: Secondary | ICD-10-CM

## 2019-04-28 DIAGNOSIS — I2510 Atherosclerotic heart disease of native coronary artery without angina pectoris: Secondary | ICD-10-CM | POA: Diagnosis not present

## 2019-04-28 DIAGNOSIS — I110 Hypertensive heart disease with heart failure: Secondary | ICD-10-CM | POA: Diagnosis not present

## 2019-04-28 DIAGNOSIS — I213 ST elevation (STEMI) myocardial infarction of unspecified site: Secondary | ICD-10-CM

## 2019-04-28 DIAGNOSIS — I447 Left bundle-branch block, unspecified: Secondary | ICD-10-CM | POA: Diagnosis not present

## 2019-04-28 DIAGNOSIS — I509 Heart failure, unspecified: Secondary | ICD-10-CM | POA: Diagnosis not present

## 2019-05-01 ENCOUNTER — Encounter (HOSPITAL_COMMUNITY): Payer: Medicare Other

## 2019-05-01 NOTE — Progress Notes (Signed)
Discharge Progress Report  Patient Details  Name: Tracy Marshall. MRN: [id number] Date of Birth: 1951-02-02 Referring Provider:     CARDIAC REHAB PHASE II ORIENTATION from 03/14/2019 in [place]  Referring Provider  Tracy Him, MD       Number of Visits: 17  Reason for Discharge:  Patient reached a stable level of exercise. Patient independent in their exercise. Patient has met program and personal goals.  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  07/27/18 STEMI  ADL [facility]SD:   Initial Exercise Prescription: Initial Exercise Prescription - 03/14/19 1500      Date of Initial Exercise RX and Referring Provider   Date  03/14/19    Referring Provider  Tracy Him, MD    Expected Discharge Date  05/12/19      Treadmill   MPH  3.2    Grade  1    Minutes  15    METs  3.89      NuStep   Level  4    SPM  85    Minutes  15    METs  3      Prescription Details   Frequency (times per week)  3    Duration  Progress to 30 minutes of continuous aerobic without signs/symptoms of physical distress      Intensity   THRR 40-80% of Max Heartrate  62-123    Ratings of Perceived Exertion  11-13    Perceived Dyspnea  0-4      Progression   Progression  Continue to progress workloads to maintain intensity without signs/symptoms of physical distress.      Resistance Training   Training Prescription  Yes    Weight  5lbs    Reps  10-15       Discharge Exercise Prescription (Final Exercise Prescription Changes): Exercise Prescription Changes - 04/28/19 1050      Response to Exercise   Blood Pressure (Admit)  104/60    Blood Pressure (Exercise)  112/68    Blood Pressure (Exit)  108/64    Heart Rate (Admit)  70 bpm    Heart Rate (Exercise)  119 bpm    Heart Rate (Exit)  68 bpm    Rating of Perceived Exertion (Exercise)  13     Symptoms  none    Comments  Patient completed the phase 2 cardiac rehab program.    Duration  Continue with 30 min of aerobic exercise without signs/symptoms of physical distress.    Intensity  THRR unchanged      Progression   Progression  Continue to progress workloads to maintain intensity without signs/symptoms of physical distress.    Average METs  6      Resistance Training   Training Prescription  Yes    Weight  7lbs    Reps  10-15    Time  10 Minutes      Interval Training   Interval Training  No      Treadmill   MPH  3.5    Grade  5    Minutes  15    METs  6.09      NuStep   Level  5    SPM  85    Minutes  15    METs  5.9      Home Exercise Plan  Plans to continue exercise at  Home (comment)   Walking   Frequency  Add 4 additional days to program exercise sessions.    Initial Home Exercises Provided  03/29/19       Functional Capacity: 6 Minute Walk    Row Name 03/14/19 1406 04/21/19 1340       6 Minute Walk   Phase  Initial  Discharge    Distance  2078 feet  2030 feet    Distance % Change  -  -2.31 %    Distance Feet Change  -  48 ft    Walk Time  6 minutes  6 minutes    # of Rest Breaks  0  0    MPH  3.94  3.84    METS  4.54  4.43    RPE  12  12    Perceived Dyspnea   0  0    VO2 Peak  15.89  15.5    Symptoms  No  No    Resting HR  52 bpm  60 bpm    Resting BP  90/50 100/68 recheck BP  102/62    Resting Oxygen Saturation   97 %  -    Exercise Oxygen Saturation  during 6 min walk  98 %  -    Max Ex. HR  94 bpm  107 bpm    Max Ex. BP  132/82  122/70    2 Minute Post BP  98/68  108/60       Psychological, QOL, Others - Outcomes: PHQ 2/9: Depression screen [facility] 2/9 04/28/2019 03/20/2019  Decreased Interest 0 0  Down, Depressed, Hopeless 0 0  PHQ - 2 Score 0 0  Some recent data might be hidden    Quality of Life: Quality of Life - 04/26/19 1358      Quality of Life Scores   Health/Function Pre  23.3 %    Health/Function Post  23.77 %     Health/Function % Change  2.02 %    Socioeconomic Pre  23.43 %    Socioeconomic Post  23.43 %    Socioeconomic % Change   0 %    Psych/Spiritual Pre  22.5 %    Psych/Spiritual Post  21.14 %    Psych/Spiritual % Change  -6.04 %    Family Pre  24 %    Family Post  24.88 %    Family % Change  3.67 %    GLOBAL Pre  23.24 %    GLOBAL Post  23.27 %    GLOBAL % Change  0.13 %       Personal Goals: Goals established at orientation with interventions provided to work toward goal. Personal Goals and Risk Factors at Admission - 03/14/19 1549      Core Components/Risk Factors/Patient Goals on Admission    Weight Management  --        Personal Goals Discharge: Goals and Risk Factor Review    Row Name 03/20/19 1204 03/31/19 1344 04/28/19 1622         Core Components/Risk Factors/Patient Goals Review   Personal Goals Review  Lipids;Other  -  Lipids;Other     Review  Pt with few CAD RFs willing to participate in CR.  Tracy Marshall would like to increase his EF and reduce risk for a future CV event.  Pt with few CAD RFs willing to participate in CR.  Tracy Marshall continues to tolerate exercise well with increased workloads.  He  feels that he is working towards increasing his stamina.  VSS.  Tracy Marshall was graduated from the [employer] with 16 comple sessions.  He feels that he increased his stamina.     Expected Outcomes  Tracy Marshall will continue to participate in CR exercise, nutrition, and lifestyle modifications.  Tracy Marshall will continue to participate in CR exercise, nutrition, and lifestyle modifications.  Tracy Marshall will continue to participate in exercise, nutrition, and lifestyle modifications.  He plans to walk and run once he gets clearance from his MD 5-7 days a week.        Exercise Goals and Review: Exercise Goals    Row Name 03/14/19 1437             Exercise Goals   Increase Physical Activity  Yes       Intervention  Provide advice, education, support and counseling about physical activity/exercise  needs.;Develop an individualized exercise prescription for aerobic and resistive training based on initial evaluation findings, risk stratification, comorbidities and participant's personal goals.       Expected Outcomes  Short Term: Attend rehab on a regular basis to increase amount of physical activity.;Long Term: Exercising regularly at least 3-5 days a week.;Long Term: Add in home exercise to make exercise part of routine and to increase amount of physical activity.       Increase Strength and Stamina  Yes       Intervention  Provide advice, education, support and counseling about physical activity/exercise needs.;Develop an individualized exercise prescription for aerobic and resistive training based on initial evaluation findings, risk stratification, comorbidities and participant's personal goals.       Expected Outcomes  Short Term: Increase workloads from initial exercise prescription for resistance, speed, and METs.;Short Term: Perform resistance training exercises routinely during rehab and add in resistance training at home;Long Term: Improve cardiorespiratory fitness, muscular endurance and strength as measured by increased METs and functional capacity (6MWT)       Able to understand and use rate of perceived exertion (RPE) scale  Yes       Intervention  Provide education and explanation on how to use RPE scale       Expected Outcomes  Short Term: Able to use RPE daily in rehab to express subjective intensity level;Long Term:  Able to use RPE to guide intensity level when exercising independently       Knowledge and understanding of Target Heart Rate Range (THRR)  Yes       Intervention  Provide education and explanation of THRR including how the numbers were predicted and where they are located for reference       Expected Outcomes  Short Term: Able to state/look up THRR;Long Term: Able to use THRR to govern intensity when exercising independently;Short Term: Able to use daily as guideline  for intensity in rehab       Able to check pulse independently  Yes       Intervention  Provide education and demonstration on how to check pulse in carotid and radial arteries.;Review the importance of being able to check your own pulse for safety during independent exercise       Expected Outcomes  Short Term: Able to explain why pulse checking is important during independent exercise;Long Term: Able to check pulse independently and accurately       Understanding of Exercise Prescription  Yes       Intervention  Provide education, explanation, and written materials on patient's individual exercise prescription  Expected Outcomes  Short Term: Able to explain program exercise prescription;Long Term: Able to explain home exercise prescription to exercise independently          Exercise Goals Re-Evaluation: Exercise Goals Re-Evaluation    Row Name 03/20/19 1048 03/29/19 1051 04/19/19 1100 04/24/19 1140 05/01/19 0734     Exercise Goal Re-Evaluation   Exercise Goals Review  Increase Physical Activity;Able to understand and use rate of perceived exertion (RPE) scale  Increase Physical Activity;Able to understand and use rate of perceived exertion (RPE) scale;Understanding of Exercise Prescription;Knowledge and understanding of Target Heart Rate Range (THRR);Able to check pulse independently  Increase Physical Activity;Able to understand and use rate of perceived exertion (RPE) scale;Understanding of Exercise Prescription;Knowledge and understanding of Target Heart Rate Range (THRR);Able to check pulse independently;Increase Strength and Stamina  Increase Physical Activity;Able to understand and use rate of perceived exertion (RPE) scale;Understanding of Exercise Prescription;Knowledge and understanding of Target Heart Rate Range (THRR);Able to check pulse independently;Increase Strength and Stamina  Increase Physical Activity;Able to understand and use rate of perceived exertion (RPE)  scale;Understanding of Exercise Prescription;Knowledge and understanding of Target Heart Rate Range (THRR);Able to check pulse independently;Increase Strength and Stamina   Comments  Patient able to understand and use RPE scale appropriately. Pt added weight loss and getting cleared by his cardiologist to run to his personal goals.  Reviewed home exercise guidelines with patient including THRR, RPE scale, and endpoints for exercise. Patient is walking 45 minutes, 4 days/week as his mode of home exercise. Pt has an app on his phone to check his heart rate. Pulse coutning handout given.  Patient is walking 3 miles/day, ~45 minutes. Pt is stretching and using weights as part of his exercise routine. Pt knows his THRR and is able to check his pulse using his smartphone. Pt is having an Echo in the near future and is hoping to see an improvement in his cardiac funtion.  Patient will complete the phase 2 cardiac rehab program this week and has progressed well achieving 6.1 METs with exercise. Patient will continue exercise walking 45 minutes or using elliptical machine 30 minutes daily. Pt also has 2 and 6.5 lb hand weights that he uses.  Patient completed the phase 2 cardiac rehab program on 04/28/19 and will continue exercise at home at this time walking, using his elliptical machine, and hand weights. Pt progressed well in the program, achieving 6.1 METs with exercise.   Expected Outcomes  Increase workloads as tolerated to help improve stamina to run once cleared to do so and to help achieve weight loss.  Increase workloads as tolerated to help improve stamina to run once cleared to do so and to help achieve weight loss.  Patient will continue walking daily to increase cardiorespiratory fitness.  Patient will continue daily exercise 30-45 minutes to maintain health and fitness gains.  Patient will exercise 30-45 minutes daily to maintain health and fitness gains.      Nutrition & Weight - Outcomes: Pre  Biometrics - 03/14/19 1347      Pre Biometrics   Height  '5\' 10"'$  (1.778 m)    Weight  83.5 kg    Waist Circumference  37.5 inches    Hip Circumference  40.5 inches    Waist to Hip Ratio  0.93 %    BMI (Calculated)  26.41    Triceps Skinfold  9 mm    % Body Fat  23.6 %    Grip Strength  48.5 kg  Flexibility  14.5 in    Single Leg Stand  30 seconds      Post Biometrics - 04/28/19 1050       Post  Biometrics   Height  '5\' 10"'$  (1.778 m)    Weight  83.9 kg    Waist Circumference  39 inches    Hip Circumference  40 inches    Waist to Hip Ratio  0.98 %    BMI (Calculated)  26.54    Triceps Skinfold  9 mm    % Body Fat  24.4 %    Grip Strength  46 kg    Flexibility  15 in    Single Leg Stand  35 seconds       Nutrition:   Nutrition Discharge:   Education Questionnaire Score: Knowledge Questionnaire Score - 04/26/19 1358      Knowledge Questionnaire Score   Post Score  24/24       Goals reviewed with patient; copy given to patient.

## 2019-05-02 NOTE — Telephone Encounter (Signed)
Pt has been made aware of his echocardiogram results and recommendations. Pt confirms his earlier appt with Dr. Radford Pax for 07/03/2019.

## 2019-05-03 ENCOUNTER — Other Ambulatory Visit: Payer: Self-pay

## 2019-05-03 ENCOUNTER — Ambulatory Visit (INDEPENDENT_AMBULATORY_CARE_PROVIDER_SITE_OTHER): Payer: Medicare Other | Admitting: *Deleted

## 2019-05-03 ENCOUNTER — Encounter (HOSPITAL_COMMUNITY): Payer: Medicare Other

## 2019-05-03 ENCOUNTER — Other Ambulatory Visit: Payer: Self-pay | Admitting: *Deleted

## 2019-05-03 DIAGNOSIS — Q211 Atrial septal defect: Secondary | ICD-10-CM | POA: Diagnosis not present

## 2019-05-03 DIAGNOSIS — I639 Cerebral infarction, unspecified: Secondary | ICD-10-CM

## 2019-05-03 DIAGNOSIS — Z952 Presence of prosthetic heart valve: Secondary | ICD-10-CM

## 2019-05-03 DIAGNOSIS — Q2112 Patent foramen ovale: Secondary | ICD-10-CM

## 2019-05-03 DIAGNOSIS — Z7901 Long term (current) use of anticoagulants: Secondary | ICD-10-CM

## 2019-05-03 LAB — POCT INR: INR: 2.1 (ref 2.0–3.0)

## 2019-05-03 MED ORDER — WARFARIN SODIUM 3 MG PO TABS: ORAL_TABLET | ORAL | 2 refills | Status: DC

## 2019-05-03 NOTE — Patient Instructions (Signed)
Description   Today take 1 tablet and tomorrow take 1.5 tablets then continue on same dosage 1 tablet daily except for 1/2 a tablet on Mondays, Wednesdays and Fridays. Recheck INR in 12 days. Call [phone] for any medication changes or upcoming procedures.

## 2019-05-05 ENCOUNTER — Encounter (HOSPITAL_COMMUNITY): Payer: Medicare Other

## 2019-05-08 ENCOUNTER — Encounter (HOSPITAL_COMMUNITY): Payer: Medicare Other

## 2019-05-10 ENCOUNTER — Encounter (HOSPITAL_COMMUNITY): Payer: Medicare Other

## 2019-05-12 ENCOUNTER — Encounter (HOSPITAL_COMMUNITY): Payer: Medicare Other

## 2019-05-15 ENCOUNTER — Other Ambulatory Visit: Payer: Self-pay

## 2019-05-15 ENCOUNTER — Ambulatory Visit (INDEPENDENT_AMBULATORY_CARE_PROVIDER_SITE_OTHER): Payer: Medicare Other | Admitting: *Deleted

## 2019-05-15 DIAGNOSIS — Z7901 Long term (current) use of anticoagulants: Secondary | ICD-10-CM

## 2019-05-15 DIAGNOSIS — Z952 Presence of prosthetic heart valve: Secondary | ICD-10-CM | POA: Diagnosis not present

## 2019-05-15 DIAGNOSIS — Z5181 Encounter for therapeutic drug level monitoring: Secondary | ICD-10-CM

## 2019-05-15 DIAGNOSIS — Q2112 Patent foramen ovale: Secondary | ICD-10-CM

## 2019-05-15 DIAGNOSIS — Q211 Atrial septal defect: Secondary | ICD-10-CM

## 2019-05-15 LAB — POCT INR: INR: 3.5 — AB (ref 2.0–3.0)

## 2019-05-15 NOTE — Patient Instructions (Signed)
Description   Continue on same dosage 1 tablet daily except for 1/2 a tablet on Mondays, Wednesdays and Fridays. Recheck INR in 3 weeks. Call [phone] for any medication changes or upcoming procedures.

## 2019-05-24 DIAGNOSIS — M47814 Spondylosis without myelopathy or radiculopathy, thoracic region: Secondary | ICD-10-CM | POA: Diagnosis not present

## 2019-05-24 DIAGNOSIS — Z79899 Other long term (current) drug therapy: Secondary | ICD-10-CM | POA: Diagnosis not present

## 2019-05-24 DIAGNOSIS — Z5181 Encounter for therapeutic drug level monitoring: Secondary | ICD-10-CM | POA: Diagnosis not present

## 2019-05-24 DIAGNOSIS — G894 Chronic pain syndrome: Secondary | ICD-10-CM | POA: Diagnosis not present

## 2019-06-05 ENCOUNTER — Ambulatory Visit (INDEPENDENT_AMBULATORY_CARE_PROVIDER_SITE_OTHER): Payer: Medicare Other | Admitting: Pharmacist

## 2019-06-05 ENCOUNTER — Other Ambulatory Visit: Payer: Self-pay

## 2019-06-05 DIAGNOSIS — Q211 Atrial septal defect: Secondary | ICD-10-CM

## 2019-06-05 DIAGNOSIS — Z7901 Long term (current) use of anticoagulants: Secondary | ICD-10-CM | POA: Diagnosis not present

## 2019-06-05 DIAGNOSIS — Z952 Presence of prosthetic heart valve: Secondary | ICD-10-CM

## 2019-06-05 DIAGNOSIS — I639 Cerebral infarction, unspecified: Secondary | ICD-10-CM

## 2019-06-05 DIAGNOSIS — Q2112 Patent foramen ovale: Secondary | ICD-10-CM

## 2019-06-05 LAB — POCT INR: INR: 3.1 — AB (ref 2.0–3.0)

## 2019-06-05 NOTE — Patient Instructions (Signed)
Description   Continue on same dosage 1 tablet daily except for 1/2 a tablet on Mondays, Wednesdays and Fridays. Recheck INR in 4 weeks. Call [phone] for any medication changes or upcoming procedures.

## 2019-06-27 ENCOUNTER — Other Ambulatory Visit: Payer: Self-pay

## 2019-06-27 ENCOUNTER — Ambulatory Visit (HOSPITAL_COMMUNITY): Payer: Medicare Other | Attending: Cardiology

## 2019-06-27 DIAGNOSIS — Q211 Atrial septal defect: Secondary | ICD-10-CM | POA: Diagnosis not present

## 2019-06-27 DIAGNOSIS — I359 Nonrheumatic aortic valve disorder, unspecified: Secondary | ICD-10-CM

## 2019-06-27 DIAGNOSIS — Z952 Presence of prosthetic heart valve: Secondary | ICD-10-CM | POA: Insufficient documentation

## 2019-06-27 DIAGNOSIS — Q2112 Patent foramen ovale: Secondary | ICD-10-CM

## 2019-06-27 MED ORDER — PERFLUTREN LIPID MICROSPHERE
1.0000 mL | INTRAVENOUS | Status: AC | PRN
  Administered 2019-06-27: 2 mL via INTRAVENOUS

## 2019-07-02 NOTE — Progress Notes (Signed)
Cardiology Office Note:    Date:  07/03/2019   ID:  Tracy Malta., DOB Apr 23, 1951, MRN [id number]  PCP:  Orpah Melter, MD  Cardiologist:  Fransico Him, MD    Referring MD: Orpah Melter, MD   Chief Complaint  Patient presents with  . Coronary Artery Disease  . Hyperlipidemia    History of Present Illness:    Tracy Reusch. is a 69 y.o. malewith a hx of anemia, PFO closure, mechanical AVR on coumadin, thoracic aortic aneurysm, carotid artery disease, GERD, HLD, LBBB, prior CVA x 2, DVT presented on 2/5/2020with anterior STEMI and cath showed thrombotic occlusion of the mid LAD and diag and underwent PTCA with no stent. Trop peaked at >65. It was felt that this was related to thrombotic event and not plaque rupture. Echo showed EF 25-30% with AK of the distal septum and apex. He was started on ACE I and BB but BP too soft for Entresto. Discharged on ASA/Plavix and coumadin with plans to stop ASA after 1 month. Lipitor was increased to 80mg daily. He then underwent TEE outpt to rule out source of embolism which showed stable mechanical AVR with mild MR, moderate to severe LV dysfunction with EF 30-35%, mild to moderate MR and mildly dilated ascending aorta at 34mm.   He was seen back in structural heart clinic for PFO follow-up and was doing well. A repeat echo was done which showed persistence of his LV dysfunction with EF 30 to 35%. His ascending aorta was stable at 43 mm. I did talk with Dr. Rayann Heman regarding possible ICD placement. Unfortunately the patient has a significant hypercoagulable state and requires an INR of 3-3.5. Dr. Rayann Heman felt that it was risky to put a device in someone with a history of thrombus formation below an INR 3 and if he put a ICD in above an INR of 3 to be increased risk of pocket bleed. Therefore the plan right now was to hold tight since he is feeling well and continue to follow him and repeat an echo in July to see if LV function is  approved and further. Repeat echo in July showed an EF of 35-40% and repeat limited echo in August showed 30-35%.    Medical therapy for ischemic CM has been limited by hypotension. He was referred to hematology for hypercoagulable work up given recurrent issues with thrombosis. Hypercoagulable workupwasunrevealing. He continues to be on Coumadin with a recommendation of being at an INR level of 3-3.5. Dr. Irene Limbo felt his mechanical valve in the setting of INR fluctuations was the most likely explanation for his STEMI.  He is here today for followup and is doing well.  He denies any chest pain or pressure, SOB, DOE, PND, orthopnea, LE edema, dizziness, palpitations or syncope. He is compliant with his meds and is tolerating meds with no SE.    Past Medical History:  Diagnosis Date  . Anemia    iron  . Arthritis   . Atypical moles    melanomna  . Basal cell carcinoma   . Bilateral carotid artery stenosis 08/03/2014   1-39% right and 40-59% left carotid artery stenosis  . CAD (coronary artery disease), native coronary artery 08/10/2018   S/P anterior STEMI secondary to thrombotic occlusion of the mid LAD and diag s/p PTCA with no stent 07/2018  . Chronic anticoagulation    for mechanical AVR  . Cluster headaches   . Coronary artery disease   . CVA (cerebral vascular accident) ([place])  07/24/2014    MRI indicating 2 punctate foci of acute infarction.  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   . Diverticulosis   . Dizziness   . Episodic recurrent vertigo 2002 & 2005   carotid dopplers w no clinically significant stenosis  . Fatty liver    hx of elevated hepatic transaminases-negative workup in 2009 except for U/S suggesting fatty liver  . GERD (gastroesophageal reflux disease)   . Gout   . Heart murmur   . Hyperlipidemia   . Hyperlipidemia LDL goal <70 12/05/2015  . Joint pain   . LBBB (left bundle branch block)   . Melanoma  ([place])    hx of melanoma and multiple basal cell carcinomas Dr Tonia Brooms  . PFO (patent foramen ovale) 12/05/2015  . Positive ANA (antinuclear antibody)   . S/P AVR (aortic valve replacement)    mechanical  . TIA (transient ischemic attack)     Past Surgical History:  Procedure Laterality Date  . APPENDECTOMY    . CARDIAC CATHETERIZATION    . CARDIAC VALVE REPLACEMENT     mechanical  . CHOLECYSTECTOMY    . CORONARY ANGIOGRAPHY N/A 07/27/2018   Procedure: CORONARY ANGIOGRAPHY (CATH LAB);  Surgeon: Burnell Blanks, MD;  Location: [place] [facility];  Service: Cardiovascular;  Laterality: N/A;  . CORONARY/GRAFT ACUTE MI REVASCULARIZATION N/A 07/27/2018   Procedure: CORONARY/GRAFT ACUTE MI REVASCULARIZATION;  Surgeon: Burnell Blanks, MD;  Location: [place] [facility];  Service: Cardiovascular;  Laterality: N/A;  . DENTAL SURGERY Left bone graft and extraction  . ESOPHAGOGASTRODUODENOSCOPY (EGD) WITH PROPOFOL N/A 11/16/2017   Procedure: ESOPHAGOGASTRODUODENOSCOPY (EGD) WITH PROPOFOL;  Surgeon: Wonda Horner, MD;  Location: [facility];  Service: Endoscopy;  Laterality: N/A;  . ESOPHAGOGASTRODUODENOSCOPY (EGD) WITH PROPOFOL N/A 11/25/2017   Procedure: ESOPHAGOGASTRODUODENOSCOPY (EGD) WITH PROPOFOL;  Surgeon: Wilford Corner, MD;  Location: [facility];  Service: Endoscopy;  Laterality: N/A;  . MELANOMA EXCISION     x3  . PATENT FORAMEN OVALE(PFO) CLOSURE N/A 11/17/2017   Procedure: PATENT FORAMEN OVALE (PFO) CLOSURE;  Surgeon: Sherren Mocha, MD;  Location: [place] [facility];  Service: Cardiovascular;  Laterality: N/A;  . POLYPECTOMY  11/16/2017   Procedure: POLYPECTOMY;  Surgeon: Wonda Horner, MD;  Location: [facility];  Service: Endoscopy;;  . SUBMUCOSAL INJECTION  11/25/2017   Procedure: SUBMUCOSAL INJECTION of epinephrine;  Surgeon: Wilford Corner, MD;  Location: [place];  Service: Endoscopy;;  . TEE WITHOUT CARDIOVERSION N/A 07/31/2014   Procedure: TRANSESOPHAGEAL  ECHOCARDIOGRAM (TEE);  Surgeon: Sueanne Margarita, MD;  Location[facility] ENDOSCOPY;  Service: Cardiovascular;  Laterality: N/A;  . TEE WITHOUT CARDIOVERSION N/A 10/01/2014   Procedure: TRANSESOPHAGEAL ECHOCARDIOGRAM (TEE);  Surgeon: Lelon Perla, MD;  Location: [facility] ENDOSCOPY;  Service: Cardiovascular;  Laterality: N/A;  . TEE WITHOUT CARDIOVERSION N/A 08/05/2018   Procedure: TRANSESOPHAGEAL ECHOCARDIOGRAM (TEE);  Surgeon: Elouise Munroe, MD;  Location: [place];  Service: Cardiology;  Laterality: N/A;    Current Medications: Current Meds  Medication Sig  . acetaminophen (TYLENOL) 500 MG tablet Take 1,000 mg by mouth every 6 (six) hours as needed for moderate pain.  [name] atorvastatin (LIPITOR) 80 MG tablet TAKE 1 TABLET BY MOUTH DAILY AT 6PM  . Carboxymethylcellul-Glycerin (LUBRICATING EYE DROPS OP) Place 1 drop into both eyes daily as needed (dry eyes).  . clopidogrel (PLAVIX) 75 MG tablet Take 1 tablet (75 mg total) by mouth daily.  . colchicine 0.6 MG tablet Take 0.6 mg by  mouth daily as needed (gout flares).   . diazepam (VALIUM) 2 MG tablet Take 4 mg by mouth at bedtime as needed for anxiety or sedation.   . fenofibrate (TRICOR) 145 MG tablet Take 1 tablet (145 mg total) by mouth daily.  . ferrous sulfate 325 (65 FE) MG tablet Take 325 mg by mouth 2 (two) times daily with a meal.  . fluticasone (FLONASE) 50 MCG/ACT nasal spray Place 1-2 sprays into both nostrils daily as needed for allergies.   [name] gabapentin (NEURONTIN) 300 MG capsule Take 900 mg by mouth 3 (three) times daily.  . methocarbamol (ROBAXIN) 500 MG tablet Take 500 mg by mouth as needed.  . nitroGLYCERIN (NITROSTAT) 0.4 MG SL tablet Place 1 tablet (0.4 mg total) under the tongue every 5 (five) minutes as needed for chest pain.  . pantoprazole (PROTONIX) 40 MG tablet Take 40 mg by mouth daily.  . ranitidine (ZANTAC) 75 MG tablet Take 75 mg by mouth 2 (two) times daily as needed for heartburn.  . warfarin (COUMADIN) 3 MG tablet  Take as directed by the Coumadin Clinic     Allergies:   Patient has no known allergies.   Social History   Socioeconomic History  . Marital status: Married    Spouse name: Not on file  . Number of children: 0  . Years of education: 89  . Highest education level: Not on file  Occupational History  . 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 Smoking status: Never Smoker  . Smokeless tobacco: Never Used  Substance and Sexual Activity  . Alcohol use: Yes    Alcohol/week: 0.0 standard drinks    Comment: moderate - 2-3 drinks about 3 times per week of wine, liquor, beer  . Drug use: No  . Sexual activity: Not Currently  Other Topics Concern  . Not on file  Social History Narrative   Lives with wife in a one story home.  No children.     Retired from [employer].   Social Determinants of Health   Financial Resource Strain: Low Risk   . Difficulty of Paying Living Expenses: Not hard at all  Food Insecurity: No Food Insecurity  . Worried About Charity fundraiser in the Last Year: Never true  . Ran Out of Food in the Last Year: Never true  Transportation Needs: No Transportation Needs  . Lack of Transportation (Medical): No  . Lack of Transportation (Non-Medical): No  Physical Activity: Insufficiently Active  . Days of Exercise per Week: 4 days  . Minutes of Exercise per Session: 30 min  Stress: No Stress Concern Present  . Feeling of Stress : Not at all  Social Connections:   . Frequency of Communication with Friends and Family: Not on file  . Frequency of Social Gatherings with Friends and Family: Not on file  . Attends Religious Services: Not on file  . Active Member of Clubs or Organizations: Not on file  . Attends Archivist Meetings: Not on file  . Marital Status: Not on file     Family History: The patient's family history includes Cancer in his father and mother; Melanoma in an other family member; Psoriasis in an other  family member; Scoliosis in his sister; Valvular heart disease in his brother.  ROS:   Please see the history of present illness.    ROS  All other systems reviewed and negative.   EKGs/Labs/Other Studies Reviewed:    The  following studies were reviewed today: none  EKG:  EKG is  ordered today.  The ekg ordered today demonstrates NSR with LBBB  Recent Labs: 07/29/2018: Magnesium 1.9 08/12/2018: ALT 25; BUN 19; Creatinine 1.17; Hemoglobin 14.5; Platelets 336; Potassium 4.0; Sodium 139   Recent Lipid Panel    Component Value Date/Time   CHOL 136 07/29/2018 0322   TRIG 150 (H) 07/29/2018 0322   HDL 41 07/29/2018 0322   CHOLHDL 3.3 07/29/2018 0322   VLDL 30 07/29/2018 0322   LDLCALC 65 07/29/2018 0322    Physical Exam:    VS:  BP 126/84   Pulse (!) 53   Ht 5\' 10"  (1.778 m)   Wt 185 lb 12.8 oz (84.3 kg)   BMI 26.66 kg/m     Wt Readings from Last 3 Encounters:  07/03/19 185 lb 12.8 oz (84.3 kg)  04/28/19 184 lb 15.5 oz (83.9 kg)  04/21/19 186 lb 4.6 oz (84.5 kg)     GEN:  Well nourished, well developed in no acute distress HEENT: Normal NECK: No JVD; No carotid bruits LYMPHATICS: No lymphadenopathy CARDIAC: RRR, no murmurs, rubs, gallops RESPIRATORY:  Clear to auscultation without rales, wheezing or rhonchi  ABDOMEN: Soft, non-tender, non-distended MUSCULOSKELETAL:  No edema; No deformity  SKIN: Warm and dry NEUROLOGIC:  Alert and oriented x 3 PSYCHIATRIC:  Normal affect   ASSESSMENT:    1. Aortic valve disorder   2. Coronary artery disease involving native coronary artery of native heart without angina pectoris   3. PFO (patent foramen ovale)   4. Thoracic aortic aneurysm without rupture ([place])   5. Bilateral carotid artery stenosis   6. Hyperlipidemia LDL goal <70   7. Hypercoagulable state ([place])    PLAN:    In order of problems listed above:  1. H/O Mechanical AVR -stable mechanical AVR by echo 1/2020with mean AVG 32mmHg -continue warfarin and Plavix   2. ASCAD  - he is s/p NSTEMI in the setting of cardioembolic event/hypercoagulable state.  -This was athrombotic occlusion of the mid LAD and diag and underwent PTCA with no stent.  -he denies any angina sx -continue Plavix 75mg  daily and statin.  -he would like to start a fast walk/slow jog which I told him was fine to do  3. PFO -status post Amplatzer occluder device which is functioning normally on last echo.  -Continue on warfarin and Plavix for his history of CVA in the setting of hypercoagulable state.  4. Thoracic aortic aneurysm -this is stable by recent 2D echo at 43 mm.  -followed by Dr. Roxan Hockey -BP controlled -continue statin   5. Bilateral carotid stenosis -minimal plaque on dopplers 2019.  -continue statin and Plavix  6. Hyperlipidemia  -his LDL goal is less than 70.  -His last LDL was 65 on 07/29/2018.  -continue atorvastatin 80mg  daily and Tricor 145mg  daily -check FLP and ALT  7. Hypercoagulable state  -followed by hematology.  -continue Coumadin  8. Ischemic DCM -his EF has been 30 to 35% status post his thrombotic occlusion of the mid LAD.  -He is on minimal heart failure therapy due to soft blood pressure and bradycardia.  -ACE I and Carvedilol stopped due to hypotension and dizziness -EP evaluated and felt that given his need for high-dose warfarin and his  increased risk for pocket hematoma during the procedure, it was too risky to hold or decrease Coumadin given that he has had an MI due to acute thrombotic events on lower doses of Coumadin.  -EF  has been consistently around 35-40%.  Cannot get MRI due to mechanical AVR.   -he is frustrated that his EF has not improved more.  Bp is much higher today and he says it now runs in the 110's.  We talked about retrying ACEI/ARB and he would like to try -start Losartan 12.5mg  daily and followup with PA in 2 weeks and if BP tolerates will increase to 25mg  daily -check BMET in 1 week    Medication Adjustments/Labs and Tests Ordered: Current medicines are reviewed at length with the patient today.  Concerns regarding medicines are outlined above.  Orders Placed This Encounter  Procedures  . EKG 12/Charge capture   No orders of the defined types were placed in this encounter.   Signed, Fransico Him, MD  07/03/2019 10:30 AM    [place]

## 2019-07-03 ENCOUNTER — Ambulatory Visit (INDEPENDENT_AMBULATORY_CARE_PROVIDER_SITE_OTHER): Payer: Medicare Other | Admitting: Cardiology

## 2019-07-03 ENCOUNTER — Ambulatory Visit (INDEPENDENT_AMBULATORY_CARE_PROVIDER_SITE_OTHER): Payer: Medicare Other | Admitting: *Deleted

## 2019-07-03 ENCOUNTER — Encounter: Payer: Self-pay | Admitting: Cardiology

## 2019-07-03 ENCOUNTER — Other Ambulatory Visit: Payer: Self-pay

## 2019-07-03 VITALS — BP 126/84 | HR 53 | Ht 70.0 in | Wt 185.8 lb

## 2019-07-03 DIAGNOSIS — Z952 Presence of prosthetic heart valve: Secondary | ICD-10-CM | POA: Diagnosis not present

## 2019-07-03 DIAGNOSIS — I2510 Atherosclerotic heart disease of native coronary artery without angina pectoris: Secondary | ICD-10-CM

## 2019-07-03 DIAGNOSIS — Z7901 Long term (current) use of anticoagulants: Secondary | ICD-10-CM

## 2019-07-03 DIAGNOSIS — I712 Thoracic aortic aneurysm, without rupture, unspecified: Secondary | ICD-10-CM

## 2019-07-03 DIAGNOSIS — I359 Nonrheumatic aortic valve disorder, unspecified: Secondary | ICD-10-CM

## 2019-07-03 DIAGNOSIS — Z5181 Encounter for therapeutic drug level monitoring: Secondary | ICD-10-CM

## 2019-07-03 DIAGNOSIS — Q2112 Patent foramen ovale: Secondary | ICD-10-CM

## 2019-07-03 DIAGNOSIS — Q211 Atrial septal defect: Secondary | ICD-10-CM

## 2019-07-03 DIAGNOSIS — I6523 Occlusion and stenosis of bilateral carotid arteries: Secondary | ICD-10-CM | POA: Diagnosis not present

## 2019-07-03 DIAGNOSIS — E785 Hyperlipidemia, unspecified: Secondary | ICD-10-CM

## 2019-07-03 DIAGNOSIS — D6859 Other primary thrombophilia: Secondary | ICD-10-CM | POA: Diagnosis not present

## 2019-07-03 LAB — POCT INR: INR: 2.2 (ref 2.0–3.0)

## 2019-07-03 MED ORDER — LOSARTAN POTASSIUM 25 MG PO TABS: 12.5000 mg | ORAL_TABLET | Freq: Every day | ORAL | 3 refills | Status: DC

## 2019-07-03 NOTE — Patient Instructions (Signed)
Medication Instructions:  Your physician has recommended you make the following change in your medication:  1) START taking losartan (Cozaar) 12.5 mg daily.   *If you need a refill on your cardiac medications before your next appointment, please call your pharmacy*  Lab Work: BMET, FLP and ALT on same day as office visit.   If you have labs (blood work) drawn today and your tests are completely normal, you will receive your results only by: [name] MyChart Message (if you have MyChart) OR . A paper copy in the mail If you have any lab test that is abnormal or we need to change your treatment, we will call you to review the results.   Follow-Up: At [facility], you and your health needs are our priority.  As part of our continuing mission to provide you with exceptional heart care, we have created designated Provider Care Teams.  These Care Teams include your primary Cardiologist (physician) and Advanced Practice Providers (APPs -  Physician Assistants and Nurse Practitioners) who all work together to provide you with the care you need, when you need it.  Your next appointment:   January 27th, 2021 at 12:15PM  The format for your next appointment:   In Person  Provider:   Ermalinda Barrios, PA-C

## 2019-07-03 NOTE — Patient Instructions (Signed)
Description   Take 1 tablet today and 1.5 tablets tomorrow. Then continue on same dosage 1 tablet daily except for 1/2 a tablet on Mondays, Wednesdays and Fridays. Recheck INR in 2 weeks. Call [phone] for any medication changes or upcoming procedures.

## 2019-07-04 ENCOUNTER — Other Ambulatory Visit: Payer: Medicare Other

## 2019-07-04 DIAGNOSIS — I6523 Occlusion and stenosis of bilateral carotid arteries: Secondary | ICD-10-CM

## 2019-07-04 DIAGNOSIS — I359 Nonrheumatic aortic valve disorder, unspecified: Secondary | ICD-10-CM

## 2019-07-04 DIAGNOSIS — E785 Hyperlipidemia, unspecified: Secondary | ICD-10-CM

## 2019-07-04 DIAGNOSIS — I712 Thoracic aortic aneurysm, without rupture, unspecified: Secondary | ICD-10-CM

## 2019-07-04 DIAGNOSIS — Q2112 Patent foramen ovale: Secondary | ICD-10-CM

## 2019-07-04 DIAGNOSIS — I2510 Atherosclerotic heart disease of native coronary artery without angina pectoris: Secondary | ICD-10-CM

## 2019-07-04 DIAGNOSIS — D6859 Other primary thrombophilia: Secondary | ICD-10-CM

## 2019-07-04 DIAGNOSIS — Q211 Atrial septal defect: Secondary | ICD-10-CM

## 2019-07-04 LAB — LIPID PANEL
Chol/HDL Ratio: 2.8 ratio (ref 0.0–5.0)
Cholesterol, Total: 118 mg/dL (ref 100–199)
HDL: 42 mg/dL (ref >39)
LDL Chol Calc (NIH): 57 mg/dL (ref 0–99)
Triglycerides: 101 mg/dL (ref 0–149)
VLDL Cholesterol Cal: 19 mg/dL (ref 5–40)

## 2019-07-04 LAB — BASIC METABOLIC PANEL
BUN/Creatinine Ratio: 15 (ref 10–24)
BUN: 16 mg/dL (ref 8–27)
CO2: 24 mmol/L (ref 20–29)
Calcium: 9.5 mg/dL (ref 8.6–10.2)
Chloride: 101 mmol/L (ref 96–106)
Creatinine, Ser: 1.08 mg/dL (ref 0.76–1.27)
GFR calc Af Amer: 82 mL/min/{1.73_m2} (ref >59)
GFR calc non Af Amer: 71 mL/min/{1.73_m2} (ref >59)
Glucose: 88 mg/dL (ref 65–99)
Potassium: 4.5 mmol/L (ref 3.5–5.2)
Sodium: 142 mmol/L (ref 134–144)

## 2019-07-04 LAB — ALT: ALT: 21 IU/L (ref 0–44)

## 2019-07-07 ENCOUNTER — Telehealth: Payer: Self-pay

## 2019-07-07 NOTE — Telephone Encounter (Signed)
I spoke to the patient and informed him of the vaccine contact information.  He verbalized understanding.

## 2019-07-18 NOTE — Progress Notes (Signed)
Cardiology Office Note    Date:  07/19/2019   ID:  Tracy Malta., DOB 02-Feb-1951, MRN [id number]  PCP:  Orpah Melter, MD  Cardiologist: Fransico Him, MD EPS: None  Chief Complaint  Patient presents with  . Follow-up    History of Present Illness:  Tracy Mccullagh. is a 69 y.o. male with a hx of anemia, PFO closure, mechanical AVR on coumadin, thoracic aortic aneurysm, carotid artery disease, GERD, HLD, LBBB, prior CVA x 2, DVT presented on 07/27/2018 with anterior STEMI and cath showed thrombotic occlusion of the mid LAD and diag and underwent PTCA with no stent.  Trop peaked at >65.  It was felt that this was related to thrombotic event and not plaque rupture.  Echo showed EF 25-30% with AK of the distal septum and apex.  He was started on ACE I and BB but BP too soft for Entresto.  Discharged on ASA/Plavix and coumadin with plans to stop ASA after 1 month.  Lipitor was increased to 80mg  daily. He then underwent TEE outpt to rule out source of embolism which showed stable mechanical AVR with mild MR, moderate to severe LV dysfunction with EF 30-35%, mild to moderate MR and mildly dilated ascending aorta at 60mm.    Dr. Velvet Bathe felt his mechanical valve in the setting of INR fluctuation was most likely the explanation for his STEMI.   He was seen back in structural heart clinic for PFO follow-up and was doing well.  A repeat echo was done which showed persistence of his LV dysfunction with EF 30 to 35%.  His ascending aorta was stable at 43 mm.  Discussion of possible ICD placement was considered but patient has hypercoagulable state and requires an INR of 3-3.5.  Dr. Rayann Heman felt it was too risky to put a device in someone with a history of thrombus formation below an INR of 3.  repeat echo in 06/27/2019 30 to 35%..  Patient saw Dr. Radford Pax 07/02/2018  patient's blood pressure was running higher so she started low-dose losartan to see if this would help improve his LVEF.  He is here for  titration of medications.  Patient comes in for f/u. Feeling well so far on losartan. BP running 110/75 at home. Denies chest pain, dyspnea, palpitations.  Works for [employer] for [employer] twice a week.   Past Medical History:  Diagnosis Date  . Anemia    iron  . Arthritis   . Atypical moles    melanomna  . Basal cell carcinoma   . Bilateral carotid artery stenosis 08/03/2014   1-39% right and 40-59% left carotid artery stenosis  . CAD (coronary artery disease), native coronary artery 08/10/2018   S/P anterior STEMI secondary to thrombotic occlusion of the mid LAD and diag s/p PTCA with no stent 07/2018  . Chronic anticoagulation    for mechanical AVR  . Cluster headaches   . Coronary artery disease   . CVA (cerebral vascular accident) ([place]) 07/24/2014    MRI indicating 2 punctate foci of acute infarction.  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   . Diverticulosis   . Dizziness   . Episodic recurrent vertigo 2002 & 2005   carotid dopplers w no clinically significant stenosis  . Fatty liver    hx of elevated hepatic transaminases-negative workup in 2009 except for U/S suggesting fatty liver  . GERD (gastroesophageal reflux disease)   . Gout   . Heart murmur   .  Hyperlipidemia   . Hyperlipidemia LDL goal <70 12/05/2015  . Joint pain   . LBBB (left bundle branch block)   . Melanoma ([place])    hx of melanoma and multiple basal cell carcinomas Dr Tonia Brooms  . PFO (patent foramen ovale) 12/05/2015  . Positive ANA (antinuclear antibody)   . S/P AVR (aortic valve replacement)    mechanical  . TIA (transient ischemic attack)     Past Surgical History:  Procedure Laterality Date  . APPENDECTOMY    . CARDIAC CATHETERIZATION    . CARDIAC VALVE REPLACEMENT     mechanical  . CHOLECYSTECTOMY    . CORONARY ANGIOGRAPHY N/A 07/27/2018   Procedure: CORONARY ANGIOGRAPHY (CATH LAB);  Surgeon: Burnell Blanks, MD;  Location: [place] [facility];  Service: Cardiovascular;  Laterality: N/A;  . CORONARY/GRAFT ACUTE MI REVASCULARIZATION N/A 07/27/2018   Procedure: CORONARY/GRAFT ACUTE MI REVASCULARIZATION;  Surgeon: Burnell Blanks, MD;  Location: [place] [facility];  Service: Cardiovascular;  Laterality: N/A;  . DENTAL SURGERY Left bone graft and extraction  . ESOPHAGOGASTRODUODENOSCOPY (EGD) WITH PROPOFOL N/A 11/16/2017   Procedure: ESOPHAGOGASTRODUODENOSCOPY (EGD) WITH PROPOFOL;  Surgeon: Wonda Horner, MD;  Location: [facility];  Service: Endoscopy;  Laterality: N/A;  . ESOPHAGOGASTRODUODENOSCOPY (EGD) WITH PROPOFOL N/A 11/25/2017   Procedure: ESOPHAGOGASTRODUODENOSCOPY (EGD) WITH PROPOFOL;  Surgeon: Wilford Corner, MD;  Location: [place];  Service: Endoscopy;  Laterality: N/A;  . MELANOMA EXCISION     x3  . PATENT FORAMEN OVALE(PFO) CLOSURE N/A 11/17/2017   Procedure: PATENT FORAMEN OVALE (PFO) CLOSURE;  Surgeon: Sherren Mocha, MD;  Location: [place] [facility];  Service: Cardiovascular;  Laterality: N/A;  . POLYPECTOMY  11/16/2017   Procedure: POLYPECTOMY;  Surgeon: Wonda Horner, MD;  Location: [facility];  Service: Endoscopy;;  . SUBMUCOSAL INJECTION  11/25/2017   Procedure: SUBMUCOSAL INJECTION of epinephrine;  Surgeon: Wilford Corner, MD;  Location: [place];  Service: Endoscopy;;  . TEE WITHOUT CARDIOVERSION N/A 07/31/2014   Procedure: TRANSESOPHAGEAL ECHOCARDIOGRAM (TEE);  Surgeon: Sueanne Margarita, MD;  Location: [facility] ENDOSCOPY;  Service: Cardiovascular;  Laterality: N/A;  . TEE WITHOUT CARDIOVERSION N/A 10/01/2014   Procedure: TRANSESOPHAGEAL ECHOCARDIOGRAM (TEE);  Surgeon: Lelon Perla, MD;  Location: [facility] ENDOS[facility];  Service: Cardiovascular;  Laterality: N/A;  . TEE WITHOUT CARDIOVERSION N/A 08/05/2018   Procedure: TRANSESOPHAGEAL ECHOCARDIOGRAM (TEE);  Surgeon: Elouise Munroe, MD;  Location: [place];  Service: Cardiology;  Laterality: N/A;    Current Medications: Current Meds   Medication Sig  . acetaminophen (TYLENOL) 500 MG tablet Take 1,000 mg by mouth every 6 (six) hours as needed for moderate pain.  [name] atorvastatin (LIPITOR) 80 MG tablet TAKE 1 TABLET BY MOUTH DAILY AT 6PM  . Carboxymethylcellul-Glycerin (LUBRICATING EYE DROPS OP) Place 1 drop into both eyes daily as needed (dry eyes).  . clopidogrel (PLAVIX) 75 MG tablet Take 1 tablet (75 mg total) by mouth daily.  . colchicine 0.6 MG tablet Take 0.6 mg by mouth daily as needed (gout flares).   . diazepam (VALIUM) 2 MG tablet Take 4 mg by mouth at bedtime as needed for anxiety or sedation.   . fenofibrate (TRICOR) 145 MG tablet Take 1 tablet (145 mg total) by mouth daily.  . ferrous sulfate 325 (65 FE) MG tablet Take 325 mg by mouth 2 (two) times daily with a meal.  . fluticasone (FLONASE) 50 MCG/ACT nasal spray Place 1-2 sprays into both nostrils daily as needed for allergies.   [name] gabapentin (NEURONTIN) 300 MG  capsule Take 900 mg by mouth 3 (three) times daily.  . methocarbamol (ROBAXIN) 500 MG tablet Take 500 mg by mouth as needed.  . nitroGLYCERIN (NITROSTAT) 0.4 MG SL tablet Place 1 tablet (0.4 mg total) under the tongue every 5 (five) minutes as needed for chest pain.  . pantoprazole (PROTONIX) 40 MG tablet Take 40 mg by mouth daily.  . ranitidine (ZANTAC) 75 MG tablet Take 75 mg by mouth 2 (two) times daily as needed for heartburn.  . warfarin (COUMADIN) 3 MG tablet Take as directed by the Coumadin Clinic  . [DISCONTINUED] losartan (COZAAR) 25 MG tablet Take 0.5 tablets (12.5 mg total) by mouth daily.     Allergies:   Patient has no known allergies.   Social History   Socioeconomic History  . Marital status: Married    Spouse name: Not on file  . Number of children: 0  . Years of education: 21  . Highest education level: Not on file  Occupational History  . 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 Smoking status: Never Smoker  . Smokeless tobacco: Never Used   Substance and Sexual Activity  . Alcohol use: Yes    Alcohol/week: 0.0 standard drinks    Comment: moderate - 2-3 drinks about 3 times per week of wine, liquor, beer  . Drug use: No  . Sexual activity: Not Currently  Other Topics Concern  . Not on file  Social History Narrative   Lives with wife in a one story home.  No children.     Retired from [employer].   Social Determinants of Health   Financial Resource Strain: Low Risk   . Difficulty of Paying Living Expenses: Not hard at all  Food Insecurity: No Food Insecurity  . Worried About Charity fundraiser in the Last Year: Never true  . Ran Out of Food in the Last Year: Never true  Transportation Needs: No Transportation Needs  . Lack of Transportation (Medical): No  . Lack of Transportation (Non-Medical): No  Physical Activity: Insufficiently Active  . Days of Exercise per Week: 4 days  . Minutes of Exercise per Session: 30 min  Stress: No Stress Concern Present  . Feeling of Stress : Not at all  Social Connections:   . Frequency of Communication with Friends and Family: Not on file  . Frequency of Social Gatherings with Friends and Family: Not on file  . Attends Religious Services: Not on file  . Active Member of Clubs or Organizations: Not on file  . Attends Archivist Meetings: Not on file  . Marital Status: Not on file     Family History:  The patient's family history includes Cancer in his father and mother; Melanoma in an other family member; Psoriasis in an other family member; Scoliosis in his sister; Valvular heart disease in his brother.   ROS:   Please see the history of present illness.    ROS All other systems reviewed and are negative.   PHYSICAL EXAM:   VS:  BP 110/84   Pulse (!) 52   Ht 5\' 10"  (1.778 m)   Wt 187 lb 3.2 oz (84.9 kg)   BMI 26.86 kg/m   Physical Exam  GEN: Well nourished, well developed, in no acute distress  Neck: no JVD, carotid bruits, or masses Cardiac:RRR;  crisp valvular clicks Respiratory:  clear to auscultation bilaterally, normal work of breathing GI: soft, nontender, nondistended, + BS Ext: without cyanosis,  clubbing, or edema, Good distal pulses bilaterally Neuro:  Alert and Oriented x 3 Psych: euthymic mood, full affect  Wt Readings from Last 3 Encounters:  07/19/19 187 lb 3.2 oz (84.9 kg)  07/03/19 185 lb 12.8 oz (84.3 kg)  04/28/19 184 lb 15.5 oz (83.9 kg)      Studies/Labs Reviewed:   EKG:  EKG is not ordered today.  Recent Labs: 07/29/2018: Magnesium 1.9 08/12/2018: Hemoglobin 14.5; Platelets 336 07/04/2019: ALT 21; BUN 16; Creatinine, Ser 1.08; Potassium 4.5; Sodium 142   Lipid Panel    Component Value Date/Time   CHOL 118 07/04/2019 0920   TRIG 101 07/04/2019 0920   HDL 42 07/04/2019 0920   CHOLHDL 2.8 07/04/2019 0920   CHOLHDL 3.3 07/29/2018 0322   VLDL 30 07/29/2018 0322   LDLCALC 57 07/04/2019 0920    Additional studies/ records that were reviewed today include:  2D echo 1/5/2021IMPRESSIONS      1. Left ventricular ejection fraction, by visual estimation, is 30 to 35%. The left ventricle has severely decreased function. There is no left ventricular hypertrophy.  2. Mid and apical anterior septum and mid and apical inferior septum are abnormal.  3. Left ventricular diastolic parameters are consistent with Grade I diastolic dysfunction (impaired relaxation).  4. The left ventricle demonstrates regional wall motion abnormalities.  5. Global right ventricle has normal systolic function.The right ventricular size is normal. No increase in right ventricular wall thickness.  6. Left atrial size was mildly dilated.  7. Right atrial size was normal.  8. The mitral valve is normal in structure. Mild mitral valve regurgitation. No evidence of mitral stenosis.  9. The tricuspid valve is normal in structure. 10. Aortic valve regurgitation is not visualized. No evidence of aortic valve sclerosis or stenosis. 11. Mechanical  prosthesis in the aortic valve position. 12. The pulmonic valve was normal in structure. Pulmonic valve regurgitation is not visualized. 13. There is moderate dilatation of the ascending aorta measuring 44 mm. 14. Mildly elevated pulmonary artery systolic pressure. 15. The inferior vena cava is normal in size with greater than 50% respiratory variability, suggesting right atrial pressure of 3 mmHg.     ASSESSMENT:    1. Ischemic cardiomyopathy   2. Hypercoagulable state ([place])   3. S/P AVR (aortic valve replacement)   4. Coronary artery disease involving native coronary artery of native heart without angina pectoris   5. PFO (patent foramen ovale)   6. Thoracic aortic aneurysm without rupture ([place])   7. Hyperlipidemia, unspecified hyperlipidemia type      PLAN:  In order of problems listed above:  Ischemic cardiomyopathy ejection fraction 30 to 35% on echo 06/27/2019 status post thrombotic occlusion of the mid LAD.  Therapy has been limited because of soft blood pressures and bradycardia.  Creatinine 1.08 07/04/2019 recheck today on losartan.  Try to increase to 25 mg once daily with repeat labs in 2 weeks.  If he does not tolerate the higher dose losartan I told him he can back down to 12.5 mg daily and will not need follow-up labs if he does that.  Follow-up with Dr. Radford Pax in 2 to 3 months.  Hypercoagulable state followed by hematology on Coumadin  History of mechanical AVR stable on echo 06/2018 mean gradient 21 mmHg on warfarin and Plavix  CAD NSTEMI in the setting of cardio embolic event and hypercoagulable state with thrombotic occlusion of the mid LAD and diagonal treated with PTCA and no stent on Plavix and statin.  PFO status  post Amplatzer occluder device which is functioning normally on last echo.  Thoracic aortic aneurysm 43 mmHg on last echo followed by Dr. Roxan Hockey  Hyperlipidemia on atorvastatin and TriCor LDL 57 07/04/2019     Medication Adjustments/Labs and  Tests Ordered: Current medicines are reviewed at length with the patient today.  Concerns regarding medicines are outlined above.  Medication changes, Labs and Tests ordered today are listed in the Patient Instructions below. Patient Instructions  Medication Instructions:  Your physician has recommended you make the following change in your medication:  1.  INCREASE the Losartan to 25 mg daily.  If your blood pressure drops or if you get dizzy, just decrease it back down.  If this happens, you want need to come in for your lab work in 2 weeks  *If you need a refill on your cardiac medications before your next appointment, please call your pharmacy*  Lab Work: TODAY:  BMET 08/02/2019:  Come to the office just for lab work.  Our lab opens at 7:30 a.m.   If you have labs (blood work) drawn today and your tests are completely normal, you will receive your results only by: [name] MyChart Message (if you have MyChart) OR . A paper copy in the mail If you have any lab test that is abnormal or we need to change your treatment, we will call you to review the results.  Testing/Procedures: None ordered  Follow-Up: At [facility], you and your health needs are our priority.  As part of our continuing mission to provide you with exceptional heart care, we have created designated Provider Care Teams.  These Care Teams include your primary Cardiologist (physician) and Advanced Practice Providers (APPs -  Physician Assistants and Nurse Practitioners) who all work together to provide you with the care you need, when you need it.  Your next appointment:   09/13/2018 ARRIVE AT 9:05   The format for your next appointment:   In Person  Provider:   You may see Fransico Him, MD or one of the following Advanced Practice Providers on your designated Care Team:    Melina Copa, PA-C  Ermalinda Barrios, PA-C        Signed, Ermalinda Barrios, PA-C  07/19/2019 12:41 PM    [address], [place], [place]  [address] Phone: [phone]; Fax: [phone]

## 2019-07-19 ENCOUNTER — Ambulatory Visit (INDEPENDENT_AMBULATORY_CARE_PROVIDER_SITE_OTHER): Payer: Medicare Other

## 2019-07-19 ENCOUNTER — Encounter: Payer: Self-pay | Admitting: Physician Assistant

## 2019-07-19 ENCOUNTER — Other Ambulatory Visit: Payer: Self-pay

## 2019-07-19 ENCOUNTER — Ambulatory Visit (INDEPENDENT_AMBULATORY_CARE_PROVIDER_SITE_OTHER): Payer: Medicare Other | Admitting: Physician Assistant

## 2019-07-19 VITALS — BP 110/84 | HR 52 | Ht 70.0 in | Wt 187.2 lb

## 2019-07-19 DIAGNOSIS — I712 Thoracic aortic aneurysm, without rupture, unspecified: Secondary | ICD-10-CM

## 2019-07-19 DIAGNOSIS — Q211 Atrial septal defect: Secondary | ICD-10-CM | POA: Diagnosis not present

## 2019-07-19 DIAGNOSIS — I639 Cerebral infarction, unspecified: Secondary | ICD-10-CM

## 2019-07-19 DIAGNOSIS — E785 Hyperlipidemia, unspecified: Secondary | ICD-10-CM

## 2019-07-19 DIAGNOSIS — Q2112 Patent foramen ovale: Secondary | ICD-10-CM

## 2019-07-19 DIAGNOSIS — I2510 Atherosclerotic heart disease of native coronary artery without angina pectoris: Secondary | ICD-10-CM | POA: Diagnosis not present

## 2019-07-19 DIAGNOSIS — Z7901 Long term (current) use of anticoagulants: Secondary | ICD-10-CM | POA: Diagnosis not present

## 2019-07-19 DIAGNOSIS — D6859 Other primary thrombophilia: Secondary | ICD-10-CM | POA: Diagnosis not present

## 2019-07-19 DIAGNOSIS — I255 Ischemic cardiomyopathy: Secondary | ICD-10-CM | POA: Diagnosis not present

## 2019-07-19 DIAGNOSIS — Z952 Presence of prosthetic heart valve: Secondary | ICD-10-CM

## 2019-07-19 LAB — POCT INR: INR: 2.6 (ref 2.0–3.0)

## 2019-07-19 MED ORDER — LOSARTAN POTASSIUM 25 MG PO TABS: 25.0000 mg | ORAL_TABLET | Freq: Every day | ORAL | 3 refills | Status: DC

## 2019-07-19 NOTE — Patient Instructions (Signed)
Description   Start taking 1 tablet daily except 1/2 tablet on Mondays and Fridays. Recheck INR in 2 weeks. Call [phone] for any medication changes or upcoming procedures.

## 2019-07-19 NOTE — Patient Instructions (Addendum)
Medication Instructions:  Your physician has recommended you make the following change in your medication:  1.  INCREASE the Losartan to 25 mg daily.  If your blood pressure drops or if you get dizzy, just decrease it back down.  If this happens, you want need to come in for your lab work in 2 weeks  *If you need a refill on your cardiac medications before your next appointment, please call your pharmacy*  Lab Work: TODAY:  BMET 08/02/2019:  Come to the office just for lab work.  Our lab opens at 7:30 a.m.   If you have labs (blood work) drawn today and your tests are completely normal, you will receive your results only by: [name] MyChart Message (if you have MyChart) OR . A paper copy in the mail If you have any lab test that is abnormal or we need to change your treatment, we will call you to review the results.  Testing/Procedures: None ordered  Follow-Up: At [facility], you and your health needs are our priority.  As part of our continuing mission to provide you with exceptional heart care, we have created designated Provider Care Teams.  These Care Teams include your primary Cardiologist (physician) and Advanced Practice Providers (APPs -  Physician Assistants and Nurse Practitioners) who all work together to provide you with the care you need, when you need it.  Your next appointment:   09/13/2018 ARRIVE AT 9:05   The format for your next appointment:   In Person  Provider:   You may see Fransico Him, MD or one of the following Advanced Practice Providers on your designated Care Team:    Melina Copa, PA-C  Ermalinda Barrios, PA-C

## 2019-07-20 ENCOUNTER — Telehealth: Payer: Self-pay

## 2019-07-20 LAB — BASIC METABOLIC PANEL
BUN/Creatinine Ratio: 20 (ref 10–24)
BUN: 20 mg/dL (ref 8–27)
CO2: 21 mmol/L (ref 20–29)
Calcium: 10.7 mg/dL — ABNORMAL HIGH (ref 8.6–10.2)
Chloride: 101 mmol/L (ref 96–106)
Creatinine, Ser: 1 mg/dL (ref 0.76–1.27)
GFR calc Af Amer: 90 mL/min/{1.73_m2} (ref >59)
GFR calc non Af Amer: 78 mL/min/{1.73_m2} (ref >59)
Glucose: 73 mg/dL (ref 65–99)
Potassium: 4.5 mmol/L (ref 3.5–5.2)
Sodium: 140 mmol/L (ref 134–144)

## 2019-07-20 NOTE — Telephone Encounter (Signed)
Spoke to patient. 1/28

## 2019-07-20 NOTE — Telephone Encounter (Signed)
I spoke to the patient who called because his Losartan was increased to 25 mg Daily at his 1/27 visit and he is feeling dizzy with a low BP 99/69.    He would like to reduce the Losartan over the next few days to 12.5 mg and see how he feels and let us know.  He will send a message on Monday 2/1.

## 2019-08-02 ENCOUNTER — Ambulatory Visit (INDEPENDENT_AMBULATORY_CARE_PROVIDER_SITE_OTHER): Payer: Medicare Other | Admitting: *Deleted

## 2019-08-02 ENCOUNTER — Other Ambulatory Visit: Payer: Self-pay

## 2019-08-02 ENCOUNTER — Other Ambulatory Visit: Payer: Medicare Other

## 2019-08-02 DIAGNOSIS — Q211 Atrial septal defect: Secondary | ICD-10-CM

## 2019-08-02 DIAGNOSIS — Z952 Presence of prosthetic heart valve: Secondary | ICD-10-CM | POA: Diagnosis not present

## 2019-08-02 DIAGNOSIS — R42 Dizziness and giddiness: Secondary | ICD-10-CM | POA: Diagnosis not present

## 2019-08-02 DIAGNOSIS — Z7901 Long term (current) use of anticoagulants: Secondary | ICD-10-CM

## 2019-08-02 DIAGNOSIS — N529 Male erectile dysfunction, unspecified: Secondary | ICD-10-CM | POA: Diagnosis not present

## 2019-08-02 DIAGNOSIS — Q2112 Patent foramen ovale: Secondary | ICD-10-CM

## 2019-08-02 LAB — POCT INR: INR: 3.1 — AB (ref 2.0–3.0)

## 2019-08-02 NOTE — Patient Instructions (Signed)
Description   Continue taking 1 tablet daily except 1/2 tablet on Mondays and Fridays. Recheck INR in 4 weeks. Call [phone] for any medication changes or upcoming procedures.

## 2019-08-03 ENCOUNTER — Other Ambulatory Visit: Payer: Self-pay | Admitting: *Deleted

## 2019-08-03 MED ORDER — CLOPIDOGREL BISULFATE 75 MG PO TABS: 75.0000 mg | ORAL_TABLET | Freq: Every day | ORAL | 1 refills | Status: DC

## 2019-08-07 ENCOUNTER — Other Ambulatory Visit: Payer: Self-pay

## 2019-08-07 MED ORDER — CLOPIDOGREL BISULFATE 75 MG PO TABS: 75.0000 mg | ORAL_TABLET | Freq: Every day | ORAL | 3 refills | Status: DC

## 2019-08-22 DIAGNOSIS — M47814 Spondylosis without myelopathy or radiculopathy, thoracic region: Secondary | ICD-10-CM | POA: Diagnosis not present

## 2019-08-22 DIAGNOSIS — R519 Headache, unspecified: Secondary | ICD-10-CM | POA: Diagnosis not present

## 2019-08-22 DIAGNOSIS — G894 Chronic pain syndrome: Secondary | ICD-10-CM | POA: Diagnosis not present

## 2019-08-28 ENCOUNTER — Other Ambulatory Visit: Payer: Self-pay

## 2019-08-28 ENCOUNTER — Telehealth (INDEPENDENT_AMBULATORY_CARE_PROVIDER_SITE_OTHER): Payer: Medicare Other | Admitting: Neurology

## 2019-08-28 ENCOUNTER — Encounter: Payer: Self-pay | Admitting: Neurology

## 2019-08-28 VITALS — Ht 70.0 in | Wt 180.0 lb

## 2019-08-28 DIAGNOSIS — I10 Essential (primary) hypertension: Secondary | ICD-10-CM | POA: Diagnosis not present

## 2019-08-28 DIAGNOSIS — Z7902 Long term (current) use of antithrombotics/antiplatelets: Secondary | ICD-10-CM

## 2019-08-28 DIAGNOSIS — Z8673 Personal history of transient ischemic attack (TIA), and cerebral infarction without residual deficits: Secondary | ICD-10-CM

## 2019-08-28 DIAGNOSIS — Z7901 Long term (current) use of anticoagulants: Secondary | ICD-10-CM | POA: Diagnosis not present

## 2019-08-28 DIAGNOSIS — Z8774 Personal history of (corrected) congenital malformations of heart and circulatory system: Secondary | ICD-10-CM

## 2019-08-28 DIAGNOSIS — I4891 Unspecified atrial fibrillation: Secondary | ICD-10-CM | POA: Diagnosis not present

## 2019-08-28 DIAGNOSIS — F419 Anxiety disorder, unspecified: Secondary | ICD-10-CM | POA: Diagnosis not present

## 2019-08-28 DIAGNOSIS — Z952 Presence of prosthetic heart valve: Secondary | ICD-10-CM | POA: Diagnosis not present

## 2019-08-28 DIAGNOSIS — Z1211 Encounter for screening for malignant neoplasm of colon: Secondary | ICD-10-CM | POA: Diagnosis not present

## 2019-08-28 DIAGNOSIS — R519 Headache, unspecified: Secondary | ICD-10-CM

## 2019-08-28 DIAGNOSIS — I712 Thoracic aortic aneurysm, without rupture: Secondary | ICD-10-CM | POA: Diagnosis not present

## 2019-08-28 DIAGNOSIS — I2510 Atherosclerotic heart disease of native coronary artery without angina pectoris: Secondary | ICD-10-CM | POA: Diagnosis not present

## 2019-08-28 DIAGNOSIS — Z0000 Encounter for general adult medical examination without abnormal findings: Secondary | ICD-10-CM | POA: Diagnosis not present

## 2019-08-28 DIAGNOSIS — K219 Gastro-esophageal reflux disease without esophagitis: Secondary | ICD-10-CM | POA: Diagnosis not present

## 2019-08-28 DIAGNOSIS — G3184 Mild cognitive impairment, so stated: Secondary | ICD-10-CM | POA: Diagnosis not present

## 2019-08-28 DIAGNOSIS — R42 Dizziness and giddiness: Secondary | ICD-10-CM | POA: Diagnosis not present

## 2019-08-28 DIAGNOSIS — E7800 Pure hypercholesterolemia, unspecified: Secondary | ICD-10-CM | POA: Diagnosis not present

## 2019-08-28 DIAGNOSIS — Z125 Encounter for screening for malignant neoplasm of prostate: Secondary | ICD-10-CM | POA: Diagnosis not present

## 2019-08-28 DIAGNOSIS — M109 Gout, unspecified: Secondary | ICD-10-CM | POA: Diagnosis not present

## 2019-08-28 DIAGNOSIS — D509 Iron deficiency anemia, unspecified: Secondary | ICD-10-CM | POA: Diagnosis not present

## 2019-08-28 NOTE — Progress Notes (Signed)
Virtual Visit via Video Note The purpose of this virtual visit is to provide medical care while limiting exposure to the novel coronavirus.    Consent was obtained for video visit:  Yes.   Answered questions that patient had about telehealth interaction:  Yes.   I discussed the limitations, risks, security and privacy concerns of performing an evaluation and management service by telemedicine. I also discussed with the patient that there may be a patient responsible charge related to this service. The patient expressed understanding and agreed to proceed.  Pt location: Home Physician Location: office Name of referring provider:  Orpah Melter, MD I connected with [name] at patients initiation/request on 08/28/2019 at  1:30 PM EST by video enabled telemedicine application and verified that I am speaking with the correct person using two identifiers. Pt MRN:  [id number]   Pt DOB:  11-11-1950 Video Participants:  [name]   History of Present Illness: This is a 69 y.o. male with s/p AVR with mechanical valve (2001) on chronic anticoagulation with coumadin, GERD, melanoma and basal cell carcinoma of the skin, and carotid stenosis (L 40-59% and R 1-39%), and history of stroke x 2 (2016 and 2018), s/p PFO closure returning with new complaints of headaches and dizziness.  He was previously seen for MCI and history of ischemic stroke in the setting of mechanical aortic valve on coumadin s/p PFO closure (2019).   He reports one month history of daily headaches, which are worse in the morning and last all day.  Pain is dull and achy, involving the entire head. No associated nausea or vomiting.  Headache is not worse with coughing, sneezing, nor does it wake him up from sleeping. He also complains of 4 spells of dizziness, described as spinning lasting about an hour and self-resolve. He is very unsteady when this occurs.      Observations/Objective:   Vitals:   08/28/19 1147   Weight: 180 lb (81.6 kg)  Height: 5\' 10"  (1.778 m)   Patient is awake, alert, and appears comfortable.  Oriented x 4.   Extraocular muscles are intact. No ptosis.  Face is symmetric.  Speech is not dysarthric. Tongue is midline. Antigravity in all extremities.  No pronator drift. Gait appears normal.   Assessment and Plan:  1.  New onset headache  - Headaches are worse in the morning which raises concern for OSA.  Consider sleep study if symptoms persist and imaging is negative  - CT head without contrast   2.  Dizziness, consistent with BPPV.    - Vestibular therapy discussed, patient would like to see ENT for specific testing, if CT neg  3.  Mild cognitive impairment, stable.  He remains highly independent with all ADLs and IADLs  4.  History of ischemic stroke in the setting of mechanical aortic valve on coumadin s/p PFO closure (2019).  No residual deficits.  No interval stroke.   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  Suspected subtherapeutic INR  - Continue coumadin.  Plavix 75mg  was added for cardiac indication due to MI  - Continue Tricord 145mg  daily   Follow Up Instructions:   I discussed the assessment and treatment plan with the patient. The patient was provided an opportunity to ask questions and all were answered. The patient  agreed with the plan and demonstrated an understanding of the instructions.   The patient was advised to call back or seek an in-person evaluation if the symptoms worsen or if the condition fails to improve as anticipated.   Alda Berthold, DO

## 2019-08-29 ENCOUNTER — Ambulatory Visit (INDEPENDENT_AMBULATORY_CARE_PROVIDER_SITE_OTHER): Payer: Medicare Other | Admitting: *Deleted

## 2019-08-29 ENCOUNTER — Other Ambulatory Visit: Payer: Self-pay | Admitting: Cardiology

## 2019-08-29 DIAGNOSIS — Z7901 Long term (current) use of anticoagulants: Secondary | ICD-10-CM

## 2019-08-29 DIAGNOSIS — Z952 Presence of prosthetic heart valve: Secondary | ICD-10-CM | POA: Diagnosis not present

## 2019-08-29 DIAGNOSIS — Q211 Atrial septal defect: Secondary | ICD-10-CM | POA: Diagnosis not present

## 2019-08-29 DIAGNOSIS — Q2112 Patent foramen ovale: Secondary | ICD-10-CM

## 2019-08-29 LAB — POCT INR: INR: 1.7 — AB (ref 2.0–3.0)

## 2019-08-29 NOTE — Progress Notes (Unsigned)
Patient needs Head CT authorization.

## 2019-08-29 NOTE — Patient Instructions (Signed)
Description   Take 1.5 tablets today and tomorrow, then continue taking 1 tablet daily except 1/2 tablet on Mondays and Fridays. Recheck INR in 1 week. Call [phone] for any medication changes or upcoming procedures.

## 2019-08-29 NOTE — Progress Notes (Signed)
CT order placed

## 2019-08-29 NOTE — Addendum Note (Signed)
Addended by: Amado Coe on: 08/29/2019 08:06 AM   Modules accepted: Orders

## 2019-08-30 DIAGNOSIS — Z1211 Encounter for screening for malignant neoplasm of colon: Secondary | ICD-10-CM | POA: Diagnosis not present

## 2019-09-01 DIAGNOSIS — Z85828 Personal history of other malignant neoplasm of skin: Secondary | ICD-10-CM | POA: Diagnosis not present

## 2019-09-01 DIAGNOSIS — D1801 Hemangioma of skin and subcutaneous tissue: Secondary | ICD-10-CM | POA: Diagnosis not present

## 2019-09-01 DIAGNOSIS — L570 Actinic keratosis: Secondary | ICD-10-CM | POA: Diagnosis not present

## 2019-09-01 DIAGNOSIS — D229 Melanocytic nevi, unspecified: Secondary | ICD-10-CM | POA: Diagnosis not present

## 2019-09-01 DIAGNOSIS — L905 Scar conditions and fibrosis of skin: Secondary | ICD-10-CM | POA: Diagnosis not present

## 2019-09-07 ENCOUNTER — Other Ambulatory Visit: Payer: Self-pay

## 2019-09-07 ENCOUNTER — Ambulatory Visit (HOSPITAL_COMMUNITY)
Admission: RE | Admit: 2019-09-07 | Discharge: 2019-09-07 | Disposition: A | Discharge status: Home / Self Care | Payer: Medicare Other | Source: Ambulatory Visit | Attending: Neurology | Admitting: Neurology

## 2019-09-07 DIAGNOSIS — R42 Dizziness and giddiness: Secondary | ICD-10-CM | POA: Insufficient documentation

## 2019-09-07 DIAGNOSIS — R519 Headache, unspecified: Secondary | ICD-10-CM | POA: Diagnosis not present

## 2019-09-07 DIAGNOSIS — G3184 Mild cognitive impairment, so stated: Secondary | ICD-10-CM | POA: Diagnosis not present

## 2019-09-13 ENCOUNTER — Other Ambulatory Visit: Payer: Self-pay

## 2019-09-13 ENCOUNTER — Encounter: Payer: Self-pay | Admitting: Cardiology

## 2019-09-13 ENCOUNTER — Ambulatory Visit (INDEPENDENT_AMBULATORY_CARE_PROVIDER_SITE_OTHER): Payer: Medicare Other | Admitting: Cardiology

## 2019-09-13 ENCOUNTER — Ambulatory Visit (INDEPENDENT_AMBULATORY_CARE_PROVIDER_SITE_OTHER): Payer: Medicare Other

## 2019-09-13 ENCOUNTER — Telehealth: Payer: Self-pay | Admitting: *Deleted

## 2019-09-13 ENCOUNTER — Telehealth: Payer: Self-pay

## 2019-09-13 VITALS — BP 100/60 | HR 54 | Ht 70.0 in | Wt 183.0 lb

## 2019-09-13 DIAGNOSIS — I712 Thoracic aortic aneurysm, without rupture, unspecified: Secondary | ICD-10-CM

## 2019-09-13 DIAGNOSIS — Z7901 Long term (current) use of anticoagulants: Secondary | ICD-10-CM | POA: Diagnosis not present

## 2019-09-13 DIAGNOSIS — Q211 Atrial septal defect: Secondary | ICD-10-CM | POA: Diagnosis not present

## 2019-09-13 DIAGNOSIS — R519 Headache, unspecified: Secondary | ICD-10-CM

## 2019-09-13 DIAGNOSIS — Q2112 Patent foramen ovale: Secondary | ICD-10-CM

## 2019-09-13 DIAGNOSIS — Z952 Presence of prosthetic heart valve: Secondary | ICD-10-CM | POA: Diagnosis not present

## 2019-09-13 DIAGNOSIS — D6859 Other primary thrombophilia: Secondary | ICD-10-CM | POA: Diagnosis not present

## 2019-09-13 DIAGNOSIS — E785 Hyperlipidemia, unspecified: Secondary | ICD-10-CM | POA: Diagnosis not present

## 2019-09-13 DIAGNOSIS — I2510 Atherosclerotic heart disease of native coronary artery without angina pectoris: Secondary | ICD-10-CM | POA: Diagnosis not present

## 2019-09-13 DIAGNOSIS — I255 Ischemic cardiomyopathy: Secondary | ICD-10-CM | POA: Diagnosis not present

## 2019-09-13 DIAGNOSIS — I429 Cardiomyopathy, unspecified: Secondary | ICD-10-CM

## 2019-09-13 DIAGNOSIS — G8929 Other chronic pain: Secondary | ICD-10-CM | POA: Diagnosis not present

## 2019-09-13 DIAGNOSIS — I6523 Occlusion and stenosis of bilateral carotid arteries: Secondary | ICD-10-CM | POA: Diagnosis not present

## 2019-09-13 LAB — POCT INR: INR: 3 (ref 2.0–3.0)

## 2019-09-13 NOTE — Patient Instructions (Signed)
Description   Continue taking 1 tablet daily except 1/2 tablet on Mondays and Fridays. Recheck INR in 3 weeks. Call [phone] for any medication changes or upcoming procedures.

## 2019-09-13 NOTE — Telephone Encounter (Signed)
-----   Message from Antonieta Iba, RN sent at 09/13/2019 10:37 AM EDT ----- Jaynie Crumble sleep study has been ordered. Office note, demographics, and [employer] questionaire have been faxed to [facility].  Thanks!

## 2019-09-13 NOTE — Telephone Encounter (Signed)
Patient Name: Tracy Marshall                                                                          DOB: 03/26/1951                                                  Height: 5'10"                             Weight: 183lbs  Office Name: [facility]                                                                                      Referring Provider: Dr. Fransico Him  Today's Date: 09/13/2019  Date:   STOP BANG RISK ASSESSMENT S (snore) Have you been told that you snore?                                       YES  T (tired) Are you often tired, fatigued, or sleepy during the day?              YES  O (obstruction) Do you stop breathing, choke, or gasp during sleep? NO   P (pressure) Do you have or are you being treated for high blood pressure? YES   B (BMI) Is your body index greater than 35 kg/m? NO   A (age) Are you 15 years old or older? NO   N (neck) Do you have a neck circumference greater than 16 inches?   NO   G (gender) Are you a male? YES   TOTAL STOP/BANG "YES" ANSWERS                                                                            For Office Use Only                                    Procedure Order Form                                 YES to 3+ Stop Bang questions OR two clinical symptoms - patient qualifies for WatchPAT (CPT 95800)     Submit:  This Form + Patient Face Sheet + Clinical Note via CloudPAT or Fax: [phone]         Clinical Notes: Will consult Sleep Specialist and refer for management of therapy due to patient increased risk of Sleep Apnea. Ordering a sleep study due to the following two clinical symptoms: Excessive daytime sleepiness G47.10 / Gastroesophageal reflux K21.9 / Nocturia R35.1 / Morning Headaches G44.221 / Difficulty concentrating R41.840 / Memory problems or poor judgment G31.84 / Personality changes or irritability R45.4 / Loud snoring R06.83 / Depression F32.9 / Unrefreshed by sleep G47.8 / Impotence N52.9 /  History of high blood pressure R03.0 / Insomnia G47.00    I understand that I am proceeding with a home sleep apnea test as ordered by my treating physician. I understand that untreated sleep apnea is a serious cardiovascular risk factor and it is my responsibility to perform the test and seek management for sleep apnea. I will be contacted with the results and be managed for sleep apnea by a local sleep physician. I will be receiving equipment and further instructions from [facility]. I shall promptly ship back the equipment via the included mailing label. I understand my insurance will be billed for the test and as the patient I am responsible for any insurance related out-of-pocket costs incurred. I have been provided with written instructions and can call for additional video or telephonic instruction, with 24-hour availability of qualified personnel to answer any questions: Patient Help Desk [phone].  Patient Telemedicine Verbal Consent

## 2019-09-13 NOTE — Patient Instructions (Addendum)
Medication Instructions:  Your physician recommends that you continue on your current medications as directed. Please refer to the Current Medication list given to you today.  *If you need a refill on your cardiac medications before your next appointment, please call your pharmacy*   Lab Work: TODAY: CBC If you have labs (blood work) drawn today and your tests are completely normal, you will receive your results only by: [name] MyChart Message (if you have MyChart) OR . A paper copy in the mail If you have any lab test that is abnormal or we need to change your treatment, we will call you to review the results.   Testing/Procedures: Your physician has recommended that you have a sleep study. This test records several body functions during sleep, including: brain activity, eye movement, oxygen and carbon dioxide blood levels, heart rate and rhythm, breathing rate and rhythm, the flow of air through your mouth and nose, snoring, body muscle movements, and chest and belly movement.   Follow-Up: At [facility], you and your health needs are our priority.  As part of our continuing mission to provide you with exceptional heart care, we have created designated Provider Care Teams.  These Care Teams include your primary Cardiologist (physician) and Advanced Practice Providers (APPs -  Physician Assistants and Nurse Practitioners) who all work together to provide you with the care you need, when you need it.  We recommend signing up for the patient portal called "MyChart".  Sign up information is provided on this After Visit Summary.  MyChart is used to connect with patients for Virtual Visits (Telemedicine).  Patients are able to view lab/test results, encounter notes, upcoming appointments, etc.  Non-urgent messages can be sent to your provider as well.   To learn more about what you can do with MyChart, go to [email or web address].    Your next appointment:   6 month(s)  The format for your  next appointment:   In Person  Provider:   Fransico Him, MD

## 2019-09-13 NOTE — Progress Notes (Signed)
Cardiology Office Note:    Date:  09/13/2019   ID:  Tracy Marshall., DOB 1951-01-12, MRN [id number]  PCP:  Orpah Melter, MD  Cardiologist:  Fransico Him, MD    Referring MD: Orpah Melter, MD   Chief Complaint  Patient presents with  . Coronary Artery Disease  . Hypertension  . Hyperlipidemia  . Congestive Heart Failure  . Cardiomyopathy    History of Present Illness:    Tracy Marshall. is a 69 y.o. male with a hx of anemia, PFO closure, mechanical AVR on coumadin, thoracic aortic aneurysm, carotid artery disease, GERD, HLD, LBBB, prior CVA x 2, DVT presented on 2/5/2020with anterior STEMI and cath showed thrombotic occlusion of the mid LAD and diag and underwent PTCA with no stent. Trop peaked at >65. It was felt that this was related to thrombotic event and not plaque rupture. Echo showed EF 25-30% with AK of the distal septum and apex. He was started on ACE I and BB but BP too soft for Entresto. Discharged on ASA/Plavix and coumadin with plans to stop ASA after 1 month. Lipitor was increased to 80mg daily. He then underwent TEE outpt to rule out source of embolism which showed stable mechanical AVR with mild MR, moderate to severe LV dysfunction with EF 30-35%, mild to moderate MR and mildly dilated ascending aorta at 75mm.   He was seen back in structural heart clinic for PFO follow-up and was doing well. A repeat echo was done which showed persistence of his LV dysfunction with EF 30 to 35%. His ascending aorta was stable at 43 mm. I did talk with Dr. Rayann Heman regarding possible ICD placement. Unfortunately the patient has a significant hypercoagulable state and requires an INR of 3-3.5. Dr. Rayann Heman felt that it was risky to put a device in someone with a history of thrombus formation below an INR 3 and if he put a ICD in above an INR of 3 to be increased risk of pocket bleed. Therefore the plan right now was to hold tight since he is feeling well and continue to  follow him and repeat an echo in July to see if LV function is approved and further.Repeat echo in July showed an EF of 35-40% and repeat limited echo in August showed 30-35%.    Medical therapy for ischemic CM has been limited by hypotension. He was referred to hematology for hypercoagulable work up given recurrent issues with thrombosis. Hypercoagulable workupwasunrevealing. He continues to be on Coumadin with a recommendation of being at an INR level of 3-3.5. Dr. Irene Limbo felt his mechanical valve in the setting of INR fluctuations was the most likely explanation for his STEMI.  He is here today for followup and is doing well.  He denies any chest pain or pressure, SOB, DOE, PND, orthopnea, LE edema, dizziness, palpitations or syncope. He is compliant with his meds and is tolerating meds with no SE.  He is walking and short runs for 3 miles daily at home without any problems.  His main complaint today is that he has been having chronic headaches for several months with negative workup with CT and Neuro eval and is very frustrated.  He has never had a sleep test and he tells me his wife says that he snores.  Past Medical History:  Diagnosis Date  . Anemia    iron  . Arthritis   . Atypical moles    melanomna  . Basal cell carcinoma   . Bilateral carotid artery  stenosis 08/03/2014   1-39% right and 40-59% left carotid artery stenosis  . CAD (coronary artery disease), native coronary artery 08/10/2018   S/P anterior STEMI secondary to thrombotic occlusion of the mid LAD and diag s/p PTCA with no stent 07/2018  . Chronic anticoagulation    for mechanical AVR  . Cluster headaches   . Coronary artery disease   . CVA (cerebral vascular accident) ([place]) 07/24/2014    MRI indicating 2 punctate foci of acute infarction.  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   . Diverticulosis   . Dizziness   . Episodic recurrent vertigo 2002 & 2005    carotid dopplers w no clinically significant stenosis  . Fatty liver    hx of elevated hepatic transaminases-negative workup in 2009 except for U/S suggesting fatty liver  . GERD (gastroesophageal reflux disease)   . Gout   . Heart murmur   . Hyperlipidemia   . Hyperlipidemia LDL goal <70 12/05/2015  . Joint pain   . LBBB (left bundle branch block)   . Melanoma ([place])    hx of melanoma and multiple basal cell carcinomas Dr Tonia Brooms  . PFO (patent foramen ovale) 12/05/2015  . Positive ANA (antinuclear antibody)   . S/P AVR (aortic valve replacement)    mechanical  . TIA (transient ischemic attack)     Past Surgical History:  Procedure Laterality Date  . APPENDECTOMY    . CARDIAC CATHETERIZATION    . CARDIAC VALVE REPLACEMENT     mechanical  . CHOLECYSTECTOMY    . CORONARY ANGIOGRAPHY N/A 07/27/2018   Procedure: CORONARY ANGIOGRAPHY (CATH LAB);  Surgeon: Burnell Blanks, MD;  Location: [place] CV LAB;  Service: Cardiovascular;  Laterality: N/A;  . CORONARY/GRAFT ACUTE MI REVASCULARIZATION N/A 07/27/2018   Procedure: CORONARY/GRAFT ACUTE MI REVASCULARIZATION;  Surgeon: Burnell Blanks, MD;  Location: [place] CV LAB;  Service: Cardiovascular;  Laterality: N/A;  . DENTAL SURGERY Left bone graft and extraction  . ESOPHAGOGASTRODUODENOSCOPY (EGD) WITH PROPOFOL N/A 11/16/2017   Procedure: ESOPHAGOGASTRODUODENOSCOPY (EGD) WITH PROPOFOL;  Surgeon: Wonda Horner, MD;  Location: [facility] ENDOSCOPY;  Service: Endoscopy;  Laterality: N/A;  . ESOPHAGOGASTRODUODENOSCOPY (EGD) WITH PROPOFOL N/A 11/25/2017   Procedure: ESOPHAGOGASTRODUODENOSCOPY (EGD) WITH PROPOFOL;  Surgeon: Wilford Corner, MD;  Location: [place];  Service: Endoscopy;  Laterality: N/A;  . MELANOMA EXCISION     x3  . PATENT FORAMEN OVALE(PFO) CLOSURE N/A 11/17/2017   Procedure: PATENT FORAMEN OVALE (PFO) CLOSURE;  Surgeon: Sherren Mocha, MD;  Location: [place] CV LAB;  Service: Cardiovascular;  Laterality:  N/A;  . POLYPECTOMY  11/16/2017   Procedure: POLYPECTOMY;  Surgeon: Wonda Horner, MD;  Location: [facility] ENDOSCOPY;  Service: Endoscopy;;  . SUBMUCOSAL INJECTION  11/25/2017   Procedure: SUBMUCOSAL INJECTION of epinephrine;  Surgeon: Wilford Corner, MD;  Location: [place];  Service: Endoscopy;;  . TEE WITHOUT CARDIOVERSION N/A 07/31/2014   Procedure: TRANSESOPHAGEAL ECHOCARDIOGRAM (TEE);  Surgeon: Sueanne Margarita, MD;  Location: [facility] ENDOSCOPY;  Service: Cardiovascular;  Laterality: N/A;  . TEE WITHOUT CARDIOVERSION N/A 10/01/2014   Procedure: TRANSESOPHAGEAL ECHOCARDIOGRAM (TEE);  Surgeon: Lelon Perla, MD;  Location: [facility]OSCOPY;  Service: Cardiovascular;  Laterality: N/A;  . TEE WITHOUT CARDIOVERSION N/A 08/05/2018   Procedure: TRANSESOPHAGEAL ECHOCARDIOGRAM (TEE);  Surgeon: Elouise Munroe, MD;  Location: [place];  Service: Cardiology;  Laterality: N/A;    Current Medications: Current Meds  Medication Sig  . acetaminophen (TYLENOL) 500 MG tablet Take 1,000  mg by mouth every 6 (six) hours as needed for moderate pain.  [name] atorvastatin (LIPITOR) 80 MG tablet Take 80 mg by mouth daily.  . Carboxymethylcellul-Glycerin (LUBRICATING EYE DROPS OP) Place 1 drop into both eyes daily as needed (dry eyes).  . clopidogrel (PLAVIX) 75 MG tablet TAKE 1 TABLET BY MOUTH EVERY DAY  . colchicine 0.6 MG tablet Take 0.6 mg by mouth daily as needed (gout flares).   . diazepam (VALIUM) 2 MG tablet Take 4 mg by mouth at bedtime as needed for anxiety or sedation.   . fenofibrate (TRICOR) 145 MG tablet Take 1 tablet (145 mg total) by mouth daily.  . ferrous sulfate 325 (65 FE) MG tablet Take 325 mg by mouth daily.   . fluticasone (FLONASE) 50 MCG/ACT nasal spray Place 1-2 sprays into both nostrils daily as needed for allergies.   [name] gabapentin (NEURONTIN) 300 MG capsule Take 900 mg by mouth 3 (three) times daily.  [name] losartan (COZAAR) 25 MG tablet Take 25 mg by mouth as directed. Takes 12.5mg  daily  .  methocarbamol (ROBAXIN) 500 MG tablet Take 500 mg by mouth as needed.  . nitroGLYCERIN (NITROSTAT) 0.4 MG SL tablet Place 1 tablet (0.4 mg total) under the tongue every 5 (five) minutes as needed for chest pain.  . pantoprazole (PROTONIX) 40 MG tablet Take 40 mg by mouth daily.  [name] warfarin (COUMADIN) 3 MG tablet Take as directed by the Coumadin Clinic     Allergies:   Patient has no known allergies.   Social History   Socioeconomic History  . Marital status: Married    Spouse name: Not on file  . Number of children: 0  . Years of education: 15  . Highest education level: Not on file  Occupational History  . 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 Smoking status: Never Smoker  . Smokeless tobacco: Never Used  Substance and Sexual Activity  . Alcohol use: Yes    Alcohol/week: 0.0 standard drinks    Comment: moderate - 2-3 drinks about 3 times per week of wine, liquor, beer  . Drug use: No  . Sexual activity: Not Currently  Other Topics Concern  . Not on file  Social History Narrative   Lives with wife in a one story home.  No children.     Retired from [employer].   Social Determinants of Health   Financial Resource Strain: Low Risk   . Difficulty of Paying Living Expenses: Not hard at all  Food Insecurity: No Food Insecurity  . Worried About Charity fundraiser in the Last Year: Never true  . Ran Out of Food in the Last Year: Never true  Transportation Needs: No Transportation Needs  . Lack of Transportation (Medical): No  . Lack of Transportation (Non-Medical): No  Physical Activity: Insufficiently Active  . Days of Exercise per Week: 4 days  . Minutes of Exercise per Session: 30 min  Stress: No Stress Concern Present  . Feeling of Stress : Not at all  Social Connections:   . Frequency of Communication with Friends and Family:   . Frequency of Social Gatherings with Friends and Family:   . Attends Religious Services:   . Active  Member of Clubs or Organizations:   . Attends Archivist Meetings:   [name] Marital Status:      Family History: The patient's family history includes Cancer in his father and mother; Melanoma in an other family member;  Psoriasis in an other family member; Scoliosis in his sister; Valvular heart disease in his brother.  ROS:   Please see the history of present illness.    ROS  All other systems reviewed and negative.   EKGs/Labs/Other Studies Reviewed:    The following studies were reviewed today: none  EKG:  EKG is not ordered today.    Recent Labs: 07/04/2019: ALT 21 07/19/2019: BUN 20; Creatinine, Ser 1.00; Potassium 4.5; Sodium 140   Recent Lipid Panel    Component Value Date/Time   CHOL 118 07/04/2019 0920   TRIG 101 07/04/2019 0920   HDL 42 07/04/2019 0920   CHOLHDL 2.8 07/04/2019 0920   CHOLHDL 3.3 07/29/2018 0322   VLDL 30 07/29/2018 0322   LDLCALC 57 07/04/2019 0920    Physical Exam:    VS:  BP 100/60   Pulse (!) 54   Ht 5\' 10"  (1.778 m)   Wt 183 lb (83 kg)   SpO2 97%   BMI 26.26 kg/m     Wt Readings from Last 3 Encounters:  09/13/19 183 lb (83 kg)  08/28/19 180 lb (81.6 kg)  07/19/19 187 lb 3.2 oz (84.9 kg)     GEN:  Well nourished, well developed in no acute distress HEENT: Normal NECK: No JVD; No carotid bruits LYMPHATICS: No lymphadenopathy CARDIAC: RRR, no murmurs, rubs, gallops RESPIRATORY:  Clear to auscultation without rales, wheezing or rhonchi  ABDOMEN: Soft, non-tender, non-distended MUSCULOSKELETAL:  No edema; No deformity  SKIN: Warm and dry NEUROLOGIC:  Alert and oriented x 3 PSYCHIATRIC:  Normal affect   ASSESSMENT:    1. H/O mechanical aortic valve replacement   2. Coronary artery disease involving native coronary artery of native heart without angina pectoris   3. PFO (patent foramen ovale)   4. Thoracic aortic aneurysm without rupture ([place])   5. Bilateral carotid artery stenosis   6. Hyperlipidemia LDL goal <70   7.  Hypercoagulable state ([place])   8. Cardiomyopathy, unspecified type ([place])   9. Chronic nonintractable headache, unspecified headache type    PLAN:    In order of problems listed above:  1.H/O Mechanical AVR -stable mechanical AVR by echo 1/2020with mean AVG 32mmHg -he has no bleeding problems on warfarin -2D echo 1/20201 showed stable mechanical AVR with mean AVG 15mmHg -continue warfarin and Plavix -INR followed in our coumadin clinic -check Hbg today  2. ASCAD  - he is s/p NSTEMI in the setting of cardioembolic event/hypercoagulable state.  -This was athrombotic occlusion of the mid LAD and diag and underwent PTCA with no stent.  -he has not had any angina CP or DOE -he is walking briskly and short runs for 3 miles daily -Continue Plavix 75mg  daily and statin  3. PFO -status post Amplatzer occluder device which is functioning normally on last echo.  -Continue on warfarin and Plavix for his history of CVA in the setting of hypercoagulable state.  4. Thoracic aortic aneurysm -this is stable by recent 2D echo at 44 mm.  -followed by Dr. Roxan Hockey -Bp remains well controlled -continue statin   5. Bilateral carotid stenosis -minimal plaque on dopplers 2019.  -continue statin and Plavix  6. Hyperlipidemia  -his LDL goal is less than 70.  -His last LDL was 57 in 07/04/2019 -continue atorvastatin 80mg  daily and Tricor 145mg  daily  7. Hypercoagulable state  -followed by hematology.  -continue Coumadin  8. Ischemic DCM -his EF has been 30 to 35% status post his thrombotic occlusion of the mid LAD.  -  repeat echo 06/2019 with no change in EF -He is on minimal heart failure therapy due to soft blood pressure and bradycardia. -EP evaluated and felt thatgiven his need for high-dosewarfarin and hisincreased risk for pocket hematoma during the procedure,it was too risky to hold or decrease Coumadin given that he has had an MI due to acute thrombotic  events on lower doses of Coumadin.  -continue Losartan 25mg  daily -no BB due to soft BP -Creatinine 1 and K+ 4.5 on 07/19/2019  9.  Chronic HAs -he has had chronic HAs for several months now -he saw Neurology and CT of head normal -he says that his wife tells him he snores a lot -I will get an [place] home sleep study to rule out OSAgiven hx of CHF as well   Medication Adjustments/Labs and Tests Ordered: Current medicines are reviewed at length with the patient today.  Concerns regarding medicines are outlined above.  No orders of the defined types were placed in this encounter.  No orders of the defined types were placed in this encounter.   Signed, Fransico Him, MD  09/13/2019 9:59 AM    [place]

## 2019-09-13 NOTE — Progress Notes (Signed)
Patient Name: Tracy Marshall        DOB: Dec 24, 1950      Height: 5'10"    Weight: 183lbs  Office Name: [facility]         Referring Provider: Dr. Fransico Him  Today's Date: 09/13/2019  Date:   STOP BANG RISK ASSESSMENT S (snore) Have you been told that you snore?     YES  T (tired) Are you often tired, fatigued, or sleepy during the day?   YES  O (obstruction) Do you stop breathing, choke, or gasp during sleep? NO   P (pressure) Do you have or are you being treated for high blood pressure? YES   B (BMI) Is your body index greater than 35 kg/m? NO   A (age) Are you 21 years old or older? NO   N (neck) Do you have a neck circumference greater than 16 inches?   NO   G (gender) Are you a male? YES   TOTAL STOP/BANG "YES" ANSWERS                                                                        For Office Use Only              Procedure Order Form    YES to 3+ Stop Bang questions OR two clinical symptoms - patient qualifies for WatchPAT (CPT 95800)     Submit: This Form + Patient Face Sheet + Clinical Note via CloudPAT or Fax: [phone]         Clinical Notes: Will consult Sleep Specialist and refer for management of therapy due to patient increased risk of Sleep Apnea. Ordering a sleep study due to the following two clinical symptoms: Excessive daytime sleepiness G47.10 / Gastroesophageal reflux K21.9 / Nocturia R35.1 / Morning Headaches G44.221 / Difficulty concentrating R41.840 / Memory problems or poor judgment G31.84 / Personality changes or irritability R45.4 / Loud snoring R06.83 / Depression F32.9 / Unrefreshed by sleep G47.8 / Impotence N52.9 / History of high blood pressure R03.0 / Insomnia G47.00    I understand that I am proceeding with a home sleep apnea test as ordered by my treating physician. I understand that untreated sleep apnea is a serious cardiovascular risk factor and it is my responsibility to perform the test and seek management for sleep apnea.  I will be contacted with the results and be managed for sleep apnea by a local sleep physician. I will be receiving equipment and further instructions from [facility]. I shall promptly ship back the equipment via the included mailing label. I understand my insurance will be billed for the test and as the patient I am responsible for any insurance related out-of-pocket costs incurred. I have been provided with written instructions and can call for additional video or telephonic instruction, with 24-hour availability of qualified personnel to answer any questions: Patient Help Desk [phone].  Patient Telemedicine Verbal Consent

## 2019-09-14 LAB — CBC
Hematocrit: 44.5 % (ref 37.5–51.0)
Hemoglobin: 15.3 g/dL (ref 13.0–17.7)
MCH: 31.5 pg (ref 26.6–33.0)
MCHC: 34.4 g/dL (ref 31.5–35.7)
MCV: 92 fL (ref 79–97)
Platelets: 287 10*3/uL (ref 150–450)
RBC: 4.86 x10E6/uL (ref 4.14–5.80)
RDW: 13 % (ref 11.6–15.4)
WBC: 4.8 10*3/uL (ref 3.4–10.8)

## 2019-09-14 LAB — SPECIMEN STATUS REPORT

## 2019-09-22 ENCOUNTER — Encounter (INDEPENDENT_AMBULATORY_CARE_PROVIDER_SITE_OTHER): Payer: Medicare Other | Admitting: Cardiology

## 2019-09-22 DIAGNOSIS — G4733 Obstructive sleep apnea (adult) (pediatric): Secondary | ICD-10-CM

## 2019-10-03 ENCOUNTER — Ambulatory Visit: Payer: Medicare Other

## 2019-10-03 NOTE — Procedures (Signed)
   Sleep Study Report  Patient Information Name: Tracy Marshall  ID: [id number] Birth Date: 11-Mar-1951  Age: 69  Gender: Male BMI: 25.2 (W=176 lb, H=5' 10'') Study Date:09/22/2019 Referring Physician:  Fransico Him, MD  TEST DESCRIPTION: Home sleep apnea testing was completed using the WatchPat, a Type 1 device, utilizing peripheral arterial tonometry (PAT), chest movement, actigraphy, pulse oximetry, pulse rate, body position and snore. AHI was calculated with apnea and hypopnea using valid sleep time as the denominator. RDI includes apneas, hypopneas, and RERAs. The data acquired and the scoring of sleep and all associated events were performed in accordance with the recommended standards and specifications as outlined in the AASM Manual for the Scoring of Sleep and Associated Events 2.2.0 (2015).  FINDINGS: 1. Mild Obstructive Sleep Apnea with AHI 14.3/hr. 2. No Central Sleep Apnea with pAHIc 0/hr. 3. Oxygen desaturations as low as 84%. 4. Severe snoring was present. O2 sats were < 88% for 1.9 minutes. 5. Total sleep time was 7 hrs and 38 min. 6. 84% of total sleep time was spent in the supine position. 7. Short Sleep onset latency at 6 min. 8. Prolonged REM sleep onset latency at 321 min. 9. Total awakenings were 5.  DIAGNOSIS: Mild Obstructive Sleep Apnea (G47.33)  RECOMMENDATIONS: 1. Clinical correlation of these findings is necessary. The decision to treat obstructive sleep apnea (OSA) is usually based on the presence of apnea symptoms or the presence of associated medical conditions such as Hypertension, Congestive Heart Failure, Atrial Fibrillation or Obesity. The most common symptoms of OSA are snoring, gasping for breath while sleeping, daytime sleepiness and fatigue.  2. Initiating apnea therapy is recommended given the presence of symptoms and/or associated conditions.  Recommend proceeding with one of the following:  a. Auto-CPAP therapy with a pressure range of 5-20cm  H2O.   b. An oral appliance (OA) that can be obtained from certain dentists with expertise in sleep medicine. These are primarily of use in non-obese patients with mild and moderate disease.   c. An ENT consultation which may be useful to look for specific causes of obstruction and possible treatment Options.   d. If patient is intolerant to PAP therapy, consider referral to ENT for evaluation for hypoglossal nerve stimulator.  3. Close follow-up is necessary to ensure success with CPAP or oral appliance therapy for maximum benefit .  4. A follow-up oximetry study on CPAP is recommended to assess the adequacy of therapy and determine the need for supplemental oxygen or the potential need for Bi-level therapy. An arterial blood gas to determine the adequacy of baseline ventilation and oxygenation should also be considered.  5. Healthy sleep recommendations include: adequate nightly sleep (normal 7-9 hrs/night), avoidance of caffeine afternoon and alcohol near bedtime, and maintaining a sleep environment that is cool, dark and quiet.  6. Weight loss for overweight patients is recommended. Even modest amounts of weight loss can significantly improve the severity of sleep apnea.  7. Snoring recommendations include: weight loss where appropriate, side sleeping, and avoidance of alcohol before bed.  8. Operation of motor vehicle or dangerous equipment must be avoided when feeling drowsy, excessively sleepy, or mentally fatigued.  Report prepared by: Signature: Fransico Him, MD   Electronically Signed 10/03/2019

## 2019-10-04 ENCOUNTER — Ambulatory Visit: Payer: Medicare Other | Admitting: Neurology

## 2019-10-05 ENCOUNTER — Ambulatory Visit: Payer: Medicare Other | Admitting: Neurology

## 2019-10-06 ENCOUNTER — Ambulatory Visit (INDEPENDENT_AMBULATORY_CARE_PROVIDER_SITE_OTHER): Payer: Medicare Other | Admitting: *Deleted

## 2019-10-06 ENCOUNTER — Other Ambulatory Visit: Payer: Self-pay

## 2019-10-06 ENCOUNTER — Telehealth: Payer: Self-pay | Admitting: *Deleted

## 2019-10-06 DIAGNOSIS — Z5181 Encounter for therapeutic drug level monitoring: Secondary | ICD-10-CM | POA: Diagnosis not present

## 2019-10-06 DIAGNOSIS — Q2112 Patent foramen ovale: Secondary | ICD-10-CM

## 2019-10-06 DIAGNOSIS — Z7901 Long term (current) use of anticoagulants: Secondary | ICD-10-CM | POA: Diagnosis not present

## 2019-10-06 DIAGNOSIS — Z952 Presence of prosthetic heart valve: Secondary | ICD-10-CM | POA: Diagnosis not present

## 2019-10-06 DIAGNOSIS — Q211 Atrial septal defect: Secondary | ICD-10-CM | POA: Diagnosis not present

## 2019-10-06 LAB — POCT INR: INR: 1.7 — AB (ref 2.0–3.0)

## 2019-10-06 NOTE — Telephone Encounter (Signed)
Informed patient of sleep study results and patient understanding was verbalized. Patient understands his sleep study showed Mild Obstructive Sleep Apnea with AHI 14.3/hr.  DME selection is [facility]. Patient understands he will be contacted by [facility] to set up his cpap. Patient understands to call if [facility] does not contact him with new setup in a timely manner. Patient understands they will be called once confirmation has been received from [facility] that they have received their new machine to schedule 10 week follow up appointment.  [facility] notified of new cpap order  Please add to airview Patient was grateful for the call and thanked me.

## 2019-10-06 NOTE — Patient Instructions (Signed)
Description   Take 1 tablet today and 1.5 tablets tomorrow, then continue taking 1 tablet daily except 1/2 tablet on Mondays and Fridays. Recheck INR in 1 week. Call [phone] for any medication changes or upcoming procedures.

## 2019-10-06 NOTE — Telephone Encounter (Signed)
-----   Message from Sueanne Margarita, MD sent at 10/03/2019  9:15 PM EDT ----- Please let patient know that they have sleep apnea and recommend auto CPAP titration through [facility].  Orders have been placed in Epic. Please set 8 week OV with me.

## 2019-10-16 ENCOUNTER — Other Ambulatory Visit: Payer: Self-pay

## 2019-10-16 ENCOUNTER — Ambulatory Visit (INDEPENDENT_AMBULATORY_CARE_PROVIDER_SITE_OTHER): Payer: Medicare Other | Admitting: *Deleted

## 2019-10-16 DIAGNOSIS — Z952 Presence of prosthetic heart valve: Secondary | ICD-10-CM

## 2019-10-16 DIAGNOSIS — Q211 Atrial septal defect: Secondary | ICD-10-CM

## 2019-10-16 DIAGNOSIS — Z7901 Long term (current) use of anticoagulants: Secondary | ICD-10-CM | POA: Diagnosis not present

## 2019-10-16 DIAGNOSIS — Q2112 Patent foramen ovale: Secondary | ICD-10-CM

## 2019-10-16 LAB — POCT INR: INR: 2.5 (ref 2.0–3.0)

## 2019-10-16 NOTE — Patient Instructions (Signed)
Description   Start taking 1 tablet daily.  Recheck INR in 1 week. Call [phone] for any medication changes or upcoming procedures.

## 2019-10-17 DIAGNOSIS — M47814 Spondylosis without myelopathy or radiculopathy, thoracic region: Secondary | ICD-10-CM | POA: Diagnosis not present

## 2019-10-17 DIAGNOSIS — Z5181 Encounter for therapeutic drug level monitoring: Secondary | ICD-10-CM | POA: Diagnosis not present

## 2019-10-17 DIAGNOSIS — Z79899 Other long term (current) drug therapy: Secondary | ICD-10-CM | POA: Diagnosis not present

## 2019-10-23 ENCOUNTER — Other Ambulatory Visit: Payer: Self-pay

## 2019-10-23 ENCOUNTER — Ambulatory Visit (INDEPENDENT_AMBULATORY_CARE_PROVIDER_SITE_OTHER): Payer: Medicare Other | Admitting: Pharmacist

## 2019-10-23 DIAGNOSIS — M109 Gout, unspecified: Secondary | ICD-10-CM | POA: Diagnosis not present

## 2019-10-23 DIAGNOSIS — I639 Cerebral infarction, unspecified: Secondary | ICD-10-CM

## 2019-10-23 DIAGNOSIS — Z952 Presence of prosthetic heart valve: Secondary | ICD-10-CM | POA: Diagnosis not present

## 2019-10-23 DIAGNOSIS — Q211 Atrial septal defect: Secondary | ICD-10-CM

## 2019-10-23 DIAGNOSIS — M546 Pain in thoracic spine: Secondary | ICD-10-CM | POA: Diagnosis not present

## 2019-10-23 DIAGNOSIS — Z8673 Personal history of transient ischemic attack (TIA), and cerebral infarction without residual deficits: Secondary | ICD-10-CM | POA: Diagnosis not present

## 2019-10-23 DIAGNOSIS — Z7901 Long term (current) use of anticoagulants: Secondary | ICD-10-CM | POA: Diagnosis not present

## 2019-10-23 DIAGNOSIS — G894 Chronic pain syndrome: Secondary | ICD-10-CM | POA: Diagnosis not present

## 2019-10-23 DIAGNOSIS — K219 Gastro-esophageal reflux disease without esophagitis: Secondary | ICD-10-CM | POA: Diagnosis not present

## 2019-10-23 DIAGNOSIS — I2510 Atherosclerotic heart disease of native coronary artery without angina pectoris: Secondary | ICD-10-CM | POA: Diagnosis not present

## 2019-10-23 DIAGNOSIS — Q2112 Patent foramen ovale: Secondary | ICD-10-CM

## 2019-10-23 DIAGNOSIS — I252 Old myocardial infarction: Secondary | ICD-10-CM | POA: Diagnosis not present

## 2019-10-23 DIAGNOSIS — M47814 Spondylosis without myelopathy or radiculopathy, thoracic region: Secondary | ICD-10-CM | POA: Diagnosis not present

## 2019-10-23 LAB — POCT INR: INR: 3.4 — AB (ref 2.0–3.0)

## 2019-10-23 NOTE — Patient Instructions (Signed)
Description   Continue taking 1 tablet daily.  Recheck INR in 2 week. Call [phone] for any medication changes or upcoming procedures.

## 2019-10-26 DIAGNOSIS — R109 Unspecified abdominal pain: Secondary | ICD-10-CM | POA: Diagnosis not present

## 2019-11-06 ENCOUNTER — Ambulatory Visit (INDEPENDENT_AMBULATORY_CARE_PROVIDER_SITE_OTHER): Payer: Medicare Other | Admitting: *Deleted

## 2019-11-06 ENCOUNTER — Other Ambulatory Visit: Payer: Self-pay

## 2019-11-06 DIAGNOSIS — Z7901 Long term (current) use of anticoagulants: Secondary | ICD-10-CM

## 2019-11-06 DIAGNOSIS — Z952 Presence of prosthetic heart valve: Secondary | ICD-10-CM

## 2019-11-06 DIAGNOSIS — Q2112 Patent foramen ovale: Secondary | ICD-10-CM

## 2019-11-06 DIAGNOSIS — Z5181 Encounter for therapeutic drug level monitoring: Secondary | ICD-10-CM | POA: Diagnosis not present

## 2019-11-06 DIAGNOSIS — Q211 Atrial septal defect: Secondary | ICD-10-CM | POA: Diagnosis not present

## 2019-11-06 LAB — POCT INR: INR: 4.3 — AB (ref 2.0–3.0)

## 2019-11-06 NOTE — Patient Instructions (Signed)
Description   Hold today and then continue taking 1 tablet daily.  Recheck INR in 2 weeks. Call [phone] for any medication changes or upcoming procedures.

## 2019-11-10 ENCOUNTER — Other Ambulatory Visit: Payer: Self-pay | Admitting: Physician Assistant

## 2019-11-10 DIAGNOSIS — R1011 Right upper quadrant pain: Secondary | ICD-10-CM | POA: Diagnosis not present

## 2019-11-10 DIAGNOSIS — M549 Dorsalgia, unspecified: Secondary | ICD-10-CM | POA: Diagnosis not present

## 2019-11-13 DIAGNOSIS — M47814 Spondylosis without myelopathy or radiculopathy, thoracic region: Secondary | ICD-10-CM | POA: Diagnosis not present

## 2019-11-13 DIAGNOSIS — Z8673 Personal history of transient ischemic attack (TIA), and cerebral infarction without residual deficits: Secondary | ICD-10-CM | POA: Diagnosis not present

## 2019-11-13 DIAGNOSIS — Z952 Presence of prosthetic heart valve: Secondary | ICD-10-CM | POA: Diagnosis not present

## 2019-11-13 DIAGNOSIS — M546 Pain in thoracic spine: Secondary | ICD-10-CM | POA: Diagnosis not present

## 2019-11-13 DIAGNOSIS — I2510 Atherosclerotic heart disease of native coronary artery without angina pectoris: Secondary | ICD-10-CM | POA: Diagnosis not present

## 2019-11-13 DIAGNOSIS — Z85820 Personal history of malignant melanoma of skin: Secondary | ICD-10-CM | POA: Diagnosis not present

## 2019-11-13 DIAGNOSIS — K219 Gastro-esophageal reflux disease without esophagitis: Secondary | ICD-10-CM | POA: Diagnosis not present

## 2019-11-13 DIAGNOSIS — I252 Old myocardial infarction: Secondary | ICD-10-CM | POA: Diagnosis not present

## 2019-11-13 DIAGNOSIS — M47812 Spondylosis without myelopathy or radiculopathy, cervical region: Secondary | ICD-10-CM | POA: Diagnosis not present

## 2019-11-13 DIAGNOSIS — G894 Chronic pain syndrome: Secondary | ICD-10-CM | POA: Diagnosis not present

## 2019-11-13 NOTE — Telephone Encounter (Signed)
  Patient Consent for Virtual Visit         Tracy Marshall. has provided verbal consent on 11/13/2019 for a virtual visit (video or telephone).   CONSENT FOR VIRTUAL VISIT FOR:  Tracy Marshall.  By participating in this virtual visit I agree to the following:  I hereby voluntarily request, consent and authorize [facility] and its employed or contracted physicians, physician assistants, nurse practitioners or other licensed health care professionals (the Practitioner), to provide me with telemedicine health care services (the "Services") as deemed necessary by the treating Practitioner. I acknowledge and consent to receive the Services by the Practitioner via telemedicine. I understand that the telemedicine visit will involve communicating with the Practitioner through live audiovisual communication technology and the disclosure of certain medical information by electronic transmission. I acknowledge that I have been given the opportunity to request an in-person assessment or other available alternative prior to the telemedicine visit and am voluntarily participating in the telemedicine visit.  I understand that I have the right to withhold or withdraw my consent to the use of telemedicine in the course of my care at any time, without affecting my right to future care or treatment, and that the Practitioner or I may terminate the telemedicine visit at any time. I understand that I have the right to inspect all information obtained and/or recorded in the course of the telemedicine visit and may receive copies of available information for a reasonable fee.  I understand that some of the potential risks of receiving the Services via telemedicine include:  [name] Delay or interruption in medical evaluation due to technological equipment failure or disruption; . Information transmitted may not be sufficient (e.g. poor resolution of images) to allow for appropriate medical decision making by the  Practitioner; and/or  . In rare instances, security protocols could fail, causing a breach of personal health information.  Furthermore, I acknowledge that it is my responsibility to provide information about my medical history, conditions and care that is complete and accurate to the best of my ability. I acknowledge that Practitioner's advice, recommendations, and/or decision may be based on factors not within their control, such as incomplete or inaccurate data provided by me or distortions of diagnostic images or specimens that may result from electronic transmissions. I understand that the practice of medicine is not an exact science and that Practitioner makes no warranties or guarantees regarding treatment outcomes. I acknowledge that a copy of this consent can be made available to me via my patient portal ([facility]), or I can request a printed copy by calling the office of [facility].    I understand that my insurance will be billed for this visit.   I have read or had this consent read to me. . I understand the contents of this consent, which adequately explains the benefits and risks of the Services being provided via telemedicine.  . I have been provided ample opportunity to ask questions regarding this consent and the Services and have had my questions answered to my satisfaction. . I give my informed consent for the services to be provided through the use of telemedicine in my medical care

## 2019-11-13 NOTE — Telephone Encounter (Signed)
Patient has a 10 week follow up appointment scheduled for 12/06/19 10:00. Patient understands he needs to keep this appointment for insurance compliance. Patient was grateful for the call and thanked me.

## 2019-11-21 ENCOUNTER — Ambulatory Visit (INDEPENDENT_AMBULATORY_CARE_PROVIDER_SITE_OTHER): Payer: Medicare Other | Admitting: *Deleted

## 2019-11-21 ENCOUNTER — Other Ambulatory Visit: Payer: Self-pay

## 2019-11-21 DIAGNOSIS — Z952 Presence of prosthetic heart valve: Secondary | ICD-10-CM | POA: Diagnosis not present

## 2019-11-21 DIAGNOSIS — Q2112 Patent foramen ovale: Secondary | ICD-10-CM

## 2019-11-21 DIAGNOSIS — Q211 Atrial septal defect: Secondary | ICD-10-CM | POA: Diagnosis not present

## 2019-11-21 DIAGNOSIS — I639 Cerebral infarction, unspecified: Secondary | ICD-10-CM

## 2019-11-21 DIAGNOSIS — Z7901 Long term (current) use of anticoagulants: Secondary | ICD-10-CM | POA: Diagnosis not present

## 2019-11-21 LAB — POCT INR: INR: 4.4 — AB (ref 2.0–3.0)

## 2019-11-21 NOTE — Patient Instructions (Signed)
Description   Hold Warfarin dose today then start taking 1 tablet daily except 1/2 tablet on Saturdays.  Recheck INR in 2 weeks. Call [phone] for any medication changes or upcoming procedures.

## 2019-11-23 ENCOUNTER — Ambulatory Visit
Admission: RE | Admit: 2019-11-23 | Discharge: 2019-11-23 | Disposition: A | Discharge status: Home / Self Care | Payer: Medicare Other | Source: Ambulatory Visit | Attending: Physician Assistant | Admitting: Physician Assistant

## 2019-11-23 ENCOUNTER — Other Ambulatory Visit: Payer: Self-pay

## 2019-11-23 DIAGNOSIS — K760 Fatty (change of) liver, not elsewhere classified: Secondary | ICD-10-CM | POA: Diagnosis not present

## 2019-11-23 DIAGNOSIS — R1011 Right upper quadrant pain: Secondary | ICD-10-CM

## 2019-11-23 MED ORDER — IOPAMIDOL (ISOVUE-300) INJECTION 61%
100.0000 mL | Freq: Once | INTRAVENOUS | Status: AC | PRN
  Administered 2019-11-23: 100 mL via INTRAVENOUS

## 2019-11-28 DIAGNOSIS — M545 Low back pain: Secondary | ICD-10-CM | POA: Diagnosis not present

## 2019-11-28 DIAGNOSIS — M5416 Radiculopathy, lumbar region: Secondary | ICD-10-CM | POA: Diagnosis not present

## 2019-12-04 ENCOUNTER — Other Ambulatory Visit: Payer: Self-pay

## 2019-12-04 ENCOUNTER — Ambulatory Visit (INDEPENDENT_AMBULATORY_CARE_PROVIDER_SITE_OTHER): Payer: Medicare Other | Admitting: *Deleted

## 2019-12-04 DIAGNOSIS — Q211 Atrial septal defect: Secondary | ICD-10-CM

## 2019-12-04 DIAGNOSIS — Z952 Presence of prosthetic heart valve: Secondary | ICD-10-CM

## 2019-12-04 DIAGNOSIS — Z7901 Long term (current) use of anticoagulants: Secondary | ICD-10-CM | POA: Diagnosis not present

## 2019-12-04 DIAGNOSIS — Q2112 Patent foramen ovale: Secondary | ICD-10-CM

## 2019-12-04 LAB — POCT INR: INR: 2.9 (ref 2.0–3.0)

## 2019-12-04 NOTE — Patient Instructions (Signed)
Description   Today take 1.5 tablets then continue taking 1 tablet daily except 1/2 tablet on Saturdays.  Recheck INR in 2 weeks. Call [phone] for any medication changes or upcoming procedures.

## 2019-12-05 NOTE — Progress Notes (Signed)
Virtual Visit via Telephone Note   This visit type was conducted due to national recommendations for restrictions regarding the COVID-19 Pandemic (e.g. social distancing) in an effort to limit this patient's exposure and mitigate transmission in our community.  Due to his co-morbid illnesses, this patient is at least at moderate risk for complications without adequate follow up.  This format is felt to be most appropriate for this patient at this time.  All issues noted in this document were discussed and addressed.  A limited physical exam was performed with this format.  Please refer to the patient's chart for his consent to telehealth for [facility].   Evaluation Performed:  Follow-up visit  This visit type was conducted due to national recommendations for restrictions regarding the COVID-19 Pandemic (e.g. social distancing).  This format is felt to be most appropriate for this patient at this time.  All issues noted in this document were discussed and addressed.  No physical exam was performed (except for noted visual exam findings with Video Visits).  Please refer to the patient's chart (MyChart message for video visits and phone note for telephone visits) for the patient's consent to telehealth for [facility].  Date:  12/06/2019   ID:  Tracy Malta., DOB 1951/01/26, MRN [id number]  Patient Location:  Home  Provider location:   [place]  PCP:  Orpah Melter, MD  Cardiologist:  Fransico Him, MD  Electrophysiologist:  None   Chief Complaint:  OSA  History of Present Illness:    Tracy Viner. is a 69 y.o. male who presents via audio/video conferencing for a telehealth visit today.    This is a 69yo male with a hx of mechanical AVR on chronic coumadin and Plavix, ASCAD, PFO s/p closure, ischemic DCM who was complaining of chronic HAs at his last OV with me.  Head CT was normal and he told me that his wife was complaining that he was having a lot of headaches.  A  home sleep study was done which showed mild OSA with an AHI of 14.3/hr and no central events.  There was no nocturnal hypoxemia but he did have severe snoring.  He was placed on auto CPAP and is now here for followup.    He is doing well with his CPAP device and thinks that he has gotten used to it.  He tolerates the mask and feels the pressure is adequate.  Since going on CPAP he feels rested in the am and has no significant daytime sleepiness.  He denies any significant mouth or nasal dryness or nasal congestion.  He does not think that he snores.  His headaches have completely resolved.   Prior CV studies:   The following studies were reviewed today:  Home sleep study and PAP compliance download  Past Medical History:  Diagnosis Date  . Anemia    iron  . Arthritis   . Atypical moles    melanomna  . Basal cell carcinoma   . Bilateral carotid artery stenosis 08/03/2014   1-39% right and 40-59% left carotid artery stenosis  . CAD (coronary artery disease), native coronary artery 08/10/2018   S/P anterior STEMI secondary to thrombotic occlusion of the mid LAD and diag s/p PTCA with no stent 07/2018  . Chronic anticoagulation    for mechanical AVR  . Cluster headaches   . Coronary artery disease   . CVA (cerebral vascular accident) ([place]) 07/24/2014    MRI indicating 2 punctate foci of acute  infarction.  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   . Diverticulosis   . Dizziness   . Episodic recurrent vertigo 2002 & 2005   carotid dopplers w no clinically significant stenosis  . Fatty liver    hx of elevated hepatic transaminases-negative workup in 2009 except for U/S suggesting fatty liver  . GERD (gastroesophageal reflux disease)   . Gout   . Heart murmur   . Hyperlipidemia   . Hyperlipidemia LDL goal <70 12/05/2015  . Joint pain   . LBBB (left bundle branch block)   . Melanoma ([place])    hx of melanoma and multiple basal  cell carcinomas Dr Tonia Brooms  . PFO (patent foramen ovale) 12/05/2015  . Positive ANA (antinuclear antibody)   . S/P AVR (aortic valve replacement)    mechanical  . TIA (transient ischemic attack)    Past Surgical History:  Procedure Laterality Date  . APPENDECTOMY    . CARDIAC CATHETERIZATION    . CARDIAC VALVE REPLACEMENT     mechanical  . CHOLECYSTECTOMY    . CORONARY ANGIOGRAPHY N/A 07/27/2018   Procedure: CORONARY ANGIOGRAPHY (CATH LAB);  Surgeon: Burnell Blanks, MD;  Location: [facility];  Service: Cardiovascular;  Laterality: N/A;  . CORONARY/GRAFT ACUTE MI REVASCULARIZATION N/A 07/27/2018   Procedure: CORONARY/GRAFT ACUTE MI REVASCULARIZATION;  Surgeon: Burnell Blanks, MD;  Location: [facility];  Service: Cardiovascular;  Laterality: N/A;  . DENTAL SURGERY Left bone graft and extraction  . ESOPHAGOGASTRODUODENOSCOPY (EGD) WITH PROPOFOL N/A 11/16/2017   Procedure: ESOPHAGOGASTRODUODENOSCOPY (EGD) WITH PROPOFOL;  Surgeon: Wonda Horner, MD;  Location: [facility];  Service: Endoscopy;  Laterality: N/A;  . ESOPHAGOGASTRODUODENOSCOPY (EGD) WITH PROPOFOL N/A 11/25/2017   Procedure: ESOPHAGOGASTRODUODENOSCOPY (EGD) WITH PROPOFOL;  Surgeon: Wilford Corner, MD;  Location: [facility];  Service: Endoscopy;  Laterality: N/A;  . MELANOMA EXCISION     x3  . PATENT FORAMEN OVALE(PFO) CLOSURE N/A 11/17/2017   Procedure: PATENT FORAMEN OVALE (PFO) CLOSURE;  Surgeon: Sherren Mocha, MD;  Location: [facility];  Service: Cardiovascular;  Laterality: N/A;  . POLYPECTOMY  11/16/2017   Procedure: POLYPECTOMY;  Surgeon: Wonda Horner, MD;  Location: [facility];  Service: Endoscopy;;  . SUBMUCOSAL INJECTION  11/25/2017   Procedure: SUBMUCOSAL INJECTION of epinephrine;  Surgeon: Wilford Corner, MD;  Location: [facility];  Service: Endoscopy;;  . TEE WITHOUT CARDIOVERSION N/A 07/31/2014   Procedure: TRANSESOPHAGEAL ECHOCARDIOGRAM (TEE);  Surgeon: Sueanne Margarita,  MD;  Location: [facility] ENDOSCOPY;  Service: Cardiovascular;  Laterality: N/A;  . TEE WITHOUT CARDIOVERSION N/A 10/01/2014   Procedure: TRANSESOPHAGEAL ECHOCARDIOGRAM (TEE);  Surgeon: Lelon Perla, MD;  Location: [facility]COPY;  Service: Cardiovascular;  Laterality: N/A;  . TEE WITHOUT CARDIOVERSION N/A 08/05/2018   Procedure: TRANSESOPHAGEAL ECHOCARDIOGRAM (TEE);  Surgeon: Elouise Munroe, MD;  Location: [facility];  Service: Cardiology;  Laterality: N/A;     Current Meds  Medication Sig  . acetaminophen (TYLENOL) 500 MG tablet Take 1,000 mg by mouth every 6 (six) hours as needed for moderate pain.  [name] atorvastatin (LIPITOR) 80 MG tablet Take 80 mg by mouth daily.  . Carboxymethylcellul-Glycerin (LUBRICATING EYE DROPS OP) Place 1 drop into both eyes daily as needed (dry eyes).  . clopidogrel (PLAVIX) 75 MG tablet TAKE 1 TABLET BY MOUTH EVERY DAY  . colchicine 0.6 MG tablet Take 0.6 mg by mouth daily as needed (gout flares).   . diazepam (VALIUM) 2 MG tablet Take 4 mg  by mouth at bedtime as needed for anxiety or sedation.   . fenofibrate (TRICOR) 145 MG tablet Take 1 tablet (145 mg total) by mouth daily.  . ferrous sulfate 325 (65 FE) MG tablet Take 325 mg by mouth daily.   . fluticasone (FLONASE) 50 MCG/ACT nasal spray Place 1-2 sprays into both nostrils daily as needed for allergies.   [name] gabapentin (NEURONTIN) 300 MG capsule Take 900 mg by mouth 3 (three) times daily.  [name] losartan (COZAAR) 25 MG tablet Take 25 mg by mouth as directed. Takes 12.5mg  daily  . methocarbamol (ROBAXIN) 500 MG tablet Take 500 mg by mouth as needed.  . nitroGLYCERIN (NITROSTAT) 0.4 MG SL tablet Place 1 tablet (0.4 mg total) under the tongue every 5 (five) minutes as needed for chest pain.  . pantoprazole (PROTONIX) 40 MG tablet Take 40 mg by mouth daily.  [name] warfarin (COUMADIN) 3 MG tablet Take as directed by the [facility]     Allergies:   Patient has no known allergies.   Social History   Tobacco Use  .  Smoking status: Never Smoker  . Smokeless tobacco: Never Used  Vaping Use  . Vaping Use: Never used  Substance Use Topics  . Alcohol use: Yes    Alcohol/week: 0.0 standard drinks    Comment: moderate - 2-3 drinks about 3 times per week of wine, liquor, beer  . Drug use: No     Family Hx: The patient's family history includes Cancer in his father and mother; Melanoma in an other family member; Psoriasis in an other family member; Scoliosis in his sister; Valvular heart disease in his brother.  ROS:   Please see the history of present illness.     All other systems reviewed and are negative.   Labs/Other Tests and Data Reviewed:    Recent Labs: 07/04/2019: ALT 21 07/19/2019: BUN 20; Creatinine, Ser 1.00; Potassium 4.5; Sodium 140 09/13/2019: Hemoglobin 15.3; Platelets 287   Recent Lipid Panel Lab Results  Component Value Date/Time   CHOL 118 07/04/2019 09:20 AM   TRIG 101 07/04/2019 09:20 AM   HDL 42 07/04/2019 09:20 AM   CHOLHDL 2.8 07/04/2019 09:20 AM   CHOLHDL 3.3 07/29/2018 03:22 AM   LDLCALC 57 07/04/2019 09:20 AM    Wt Readings from Last 3 Encounters:  12/06/19 185 lb (83.9 kg)  09/13/19 183 lb (83 kg)  08/28/19 180 lb (81.6 kg)     Objective:    Vital Signs:  Ht 5\' 10"  (1.778 m)   Wt 185 lb (83.9 kg)   BMI 26.54 kg/m     ASSESSMENT & PLAN:    1.  OSA - The pathophysiology of obstructive sleep apnea , it's cardiovascular consequences & modes of treatment including CPAP were discused with the patient in detail & they evidenced understanding.  The patient is tolerating PAP therapy well without any problems. The PAP download was reviewed today and showed an AHI of 3.1/hr on auto CPAP with 100% compliance in using more than 4 hours nightly.  The patient has been using and benefiting from PAP use and will continue to benefit from therapy.   2.  Chronic HAs -these have resolved with treatment of his OSA -continue PAP therapy -encouraged patient to practice good  sleep hygiene  Time:   Today, I have spent 20 minutes on telemedicine discussing medical problems including OSA, HAs and reviewing patient's chart including sleep study and PAP compliance download.  Medication Adjustments/Labs and Tests Ordered: Current medicines are  reviewed at length with the patient today.  Concerns regarding medicines are outlined above.  Tests Ordered: No orders of the defined types were placed in this encounter.  Medication Changes: No orders of the defined types were placed in this encounter.   Disposition:  Follow up 6 months Signed, Fransico Him, MD  12/06/2019 10:18 AM    [place]

## 2019-12-06 ENCOUNTER — Encounter: Payer: Self-pay | Admitting: Cardiology

## 2019-12-06 ENCOUNTER — Other Ambulatory Visit: Payer: Self-pay

## 2019-12-06 ENCOUNTER — Telehealth (INDEPENDENT_AMBULATORY_CARE_PROVIDER_SITE_OTHER): Payer: Medicare Other | Admitting: Cardiology

## 2019-12-06 VITALS — BP 113/82 | HR 60 | Temp 96.9°F | Ht 70.0 in | Wt 185.0 lb

## 2019-12-06 DIAGNOSIS — R519 Headache, unspecified: Secondary | ICD-10-CM

## 2019-12-06 DIAGNOSIS — I255 Ischemic cardiomyopathy: Secondary | ICD-10-CM | POA: Diagnosis not present

## 2019-12-06 DIAGNOSIS — G8929 Other chronic pain: Secondary | ICD-10-CM | POA: Diagnosis not present

## 2019-12-06 DIAGNOSIS — G4733 Obstructive sleep apnea (adult) (pediatric): Secondary | ICD-10-CM | POA: Diagnosis not present

## 2019-12-06 NOTE — Patient Instructions (Signed)
Medication Instructions:  Your physician recommends that you continue on your current medications as directed. Please refer to the Current Medication list given to you today.  *If you need a refill on your cardiac medications before your next appointment, please call your pharmacy*  Follow-Up: At [facility], you and your health needs are our priority.  As part of our continuing mission to provide you with exceptional heart care, we have created designated Provider Care Teams.  These Care Teams include your primary Cardiologist (physician) and Advanced Practice Providers (APPs -  Physician Assistants and Nurse Practitioners) who all work together to provide you with the care you need, when you need it.   Your next appointment:   6 month(s)  The format for your next appointment:   Virtual Visit   Provider:   Traci Turner, MD     

## 2019-12-07 ENCOUNTER — Other Ambulatory Visit: Payer: Self-pay | Admitting: Thoracic Surgery (Cardiothoracic Vascular Surgery)

## 2019-12-07 DIAGNOSIS — I712 Thoracic aortic aneurysm, without rupture, unspecified: Secondary | ICD-10-CM

## 2019-12-21 ENCOUNTER — Ambulatory Visit (INDEPENDENT_AMBULATORY_CARE_PROVIDER_SITE_OTHER): Payer: Medicare Other | Admitting: Pharmacist

## 2019-12-21 ENCOUNTER — Other Ambulatory Visit: Payer: Self-pay

## 2019-12-21 DIAGNOSIS — Z7901 Long term (current) use of anticoagulants: Secondary | ICD-10-CM

## 2019-12-21 DIAGNOSIS — Q211 Atrial septal defect: Secondary | ICD-10-CM

## 2019-12-21 DIAGNOSIS — Q2112 Patent foramen ovale: Secondary | ICD-10-CM

## 2019-12-21 DIAGNOSIS — Z952 Presence of prosthetic heart valve: Secondary | ICD-10-CM | POA: Diagnosis not present

## 2019-12-21 LAB — POCT INR: INR: 3.5 — AB (ref 2.0–3.0)

## 2019-12-21 NOTE — Patient Instructions (Signed)
Description   Continue taking 1 tablet daily except 1/2 tablet on Saturdays.  Recheck INR in 4 weeks. Call [phone] for any medication changes or upcoming procedures.

## 2020-01-04 ENCOUNTER — Other Ambulatory Visit: Payer: Self-pay

## 2020-01-04 MED ORDER — NITROGLYCERIN 0.4 MG SL SUBL: 0.4000 mg | SUBLINGUAL_TABLET | SUBLINGUAL | 6 refills | Status: DC | PRN

## 2020-01-09 ENCOUNTER — Encounter: Payer: Self-pay | Admitting: Thoracic Surgery (Cardiothoracic Vascular Surgery)

## 2020-01-09 ENCOUNTER — Other Ambulatory Visit: Payer: Self-pay

## 2020-01-09 ENCOUNTER — Ambulatory Visit (INDEPENDENT_AMBULATORY_CARE_PROVIDER_SITE_OTHER): Payer: Medicare Other | Admitting: Thoracic Surgery (Cardiothoracic Vascular Surgery)

## 2020-01-09 ENCOUNTER — Ambulatory Visit
Admission: RE | Admit: 2020-01-09 | Discharge: 2020-01-09 | Disposition: A | Discharge status: Home / Self Care | Payer: Medicare Other | Source: Ambulatory Visit | Attending: Thoracic Surgery (Cardiothoracic Vascular Surgery) | Admitting: Thoracic Surgery (Cardiothoracic Vascular Surgery)

## 2020-01-09 VITALS — BP 104/69 | HR 47 | Temp 97.9°F | Resp 16 | Ht 70.0 in | Wt 180.0 lb

## 2020-01-09 DIAGNOSIS — I712 Thoracic aortic aneurysm, without rupture, unspecified: Secondary | ICD-10-CM

## 2020-01-09 DIAGNOSIS — I255 Ischemic cardiomyopathy: Secondary | ICD-10-CM

## 2020-01-09 MED ORDER — IOPAMIDOL (ISOVUE-370) INJECTION 76%
75.0000 mL | Freq: Once | INTRAVENOUS | Status: AC | PRN
  Administered 2020-01-09: 75 mL via INTRAVENOUS

## 2020-01-09 NOTE — Progress Notes (Signed)
[address]       [place] [address]             [phone]      HPI: 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  He has been followed for ascending aortic aneurysm since 2018.  His mechanical valve replacement was in [place] in 2001.  After moving to [facility] was found to have an ascending aneurysm on a CT in 2018.  Initially it was measured at 4.5 cm although it appeared to be smaller than that.  Most recently has been measured at 4.2 cm.  In the interim since his last visit he has been doing well.  He has not had any new cardiac issues.  He denies chest pain, pressure, tightness, and shortness of breath.  He is active.  He does complain of feeling fatigued at times.  Past Medical History:  Diagnosis Date   Anemia    iron   Arthritis    Atypical moles    melanomna   Basal cell carcinoma    Bilateral carotid artery stenosis 08/03/2014   1-39% right and 40-59% left carotid artery stenosis   CAD (coronary artery disease), native coronary artery 08/10/2018   S/P anterior STEMI secondary to thrombotic occlusion of the mid LAD and diag s/p PTCA with no stent 07/2018   Chronic anticoagulation    for mechanical AVR   Cluster headaches    Coronary artery disease    CVA (cerebral vascular accident) ([place]) 07/24/2014    MRI indicating 2 punctate foci of acute infarction.  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    Diverticulosis    Dizziness    Episodic recurrent vertigo 2002 & 2005   carotid dopplers w no clinically significant stenosis   Fatty liver    hx of elevated hepatic transaminases-negative workup in 2009 except for U/S suggesting fatty  liver   GERD (gastroesophageal reflux disease)    Gout    Heart murmur    Hyperlipidemia    Hyperlipidemia LDL goal <70 12/05/2015   Joint pain    LBBB (left bundle branch block)    Melanoma (HCC)    hx of melanoma and multiple basal cell carcinomas Dr Tonia Brooms   PFO (patent foramen ovale) 12/05/2015   Positive ANA (antinuclear antibody)    S/P AVR (aortic valve replacement)    mechanical   TIA (transient ischemic attack)     Current Outpatient Medications  Medication Sig Dispense Refill   acetaminophen (TYLENOL) 500 MG tablet Take 1,000 mg by mouth every 6 (six) hours as needed for moderate pain.     atorvastatin (LIPITOR) 80 MG tablet Take 80 mg by mouth daily.     Carboxymethylcellul-Glycerin (LUBRICATING EYE DROPS OP) Place 1 drop into both eyes daily as needed (dry eyes).     clopidogrel (PLAVIX) 75 MG tablet TAKE 1 TABLET BY MOUTH EVERY DAY 90 tablet 3   colchicine 0.6 MG tablet Take 0.6 mg by mouth daily as needed (gout flares).      diazepam (VALIUM) 2 MG tablet Take 4 mg by mouth at bedtime as needed for anxiety or sedation.      fenofibrate (TRICOR) 145 MG tablet Take 1 tablet (145 mg total) by  mouth daily. 90 tablet 3   ferrous sulfate 325 (65 FE) MG tablet Take 325 mg by mouth daily.      fluticasone (FLONASE) 50 MCG/ACT nasal spray Place 1-2 sprays into both nostrils daily as needed for allergies.      gabapentin (NEURONTIN) 300 MG capsule Take 900 mg by mouth 3 (three) times daily.     losartan (COZAAR) 25 MG tablet Take 25 mg by mouth as directed. Takes 12.5mg  daily     methocarbamol (ROBAXIN) 500 MG tablet Take 500 mg by mouth as needed.     nitroGLYCERIN (NITROSTAT) 0.4 MG SL tablet Place 1 tablet (0.4 mg total) under the tongue every 5 (five) minutes as needed for chest pain. 25 tablet 6   pantoprazole (PROTONIX) 40 MG tablet Take 40 mg by mouth daily.     warfarin (COUMADIN) 3 MG tablet Take as directed by the [facility] 30 tablet 2    No current facility-administered medications for this visit.    Physical Exam BP 104/69 (BP Location: Right Arm, Patient Position: Sitting, Cuff Size: Normal)    Pulse (!) 47    Temp 97.9 F (36.6 C)    Resp 16    Ht 5\' 10"  (1.778 m)    Wt 180 lb (81.6 kg)    SpO2 97% Comment: RA   BMI 25.59 kg/m  69 year old man in no acute distress Alert and oriented x3 with no focal deficits Cardiac regular rate and rhythm with a 2/6 systolic murmur and good valve click Lungs clear with equal breath sounds by laterally No peripheral edema  Diagnostic Tests: CT ANGIOGRAPHY CHEST WITH CONTRAST  TECHNIQUE: Multidetector CT imaging of the chest was performed using the standard protocol during bolus administration of intravenous contrast. Multiplanar CT image reconstructions and MIPs were obtained to evaluate the vascular anatomy.  Creatinine was obtained on site at [place] [facility] at [address]  Results: Creatinine 1.2 mg/dL.  CONTRAST:  87mL ISOVUE-370 IOPAMIDOL (ISOVUE-370) INJECTION 76%  COMPARISON:  December 14, 2017, December 14, 2018  FINDINGS: Cardiovascular: Unchanged 4.4 x 4.3 cm ascending thoracic aortic aneurysm. Status post median sternotomy and aortic valve repair. Heart is normal in size. Mild left-sided coronary artery atherosclerotic calcifications. The left vertebral artery arises from the aortic arch. No central pulmonary embolism. Status post ASD repair.  Mediastinum/Nodes: No enlarged mediastinal, hilar, or axillary lymph nodes. Unchanged 11 mm left thyroid nodule  Lungs/Pleura: Unchanged appearance of an irregular 6 mm ground-glass pulmonary nodule versus confluence of vessels of the right lower lobe inferiorly (series 5, image 112). No pleural effusion or pneumothorax.  Upper Abdomen: Chronic scarring of the left kidney.  Musculoskeletal: Mild degenerative changes of the thoracic spine. No aggressive osseous lesions.  Review of the MIP  images confirms the above findings.  IMPRESSION: 1. Unchanged 4.4 cm ascending thoracic aortic aneurysm. Recommend annual imaging followup by CTA or MRA. This recommendation follows 2010 ACCF/AHA/AATS/ACR/ASA/SCA/SCAI/SIR/STS/SVM Guidelines for the Diagnosis and Management of Patients with Thoracic Aortic Disease. Circulation. 2010; 121: J188-C166. Aortic aneurysm NOS (ICD10-I71.9) 2. Unchanged appearance of irregular ground-glass pulmonary nodule versus confluence of vessels of the right lower lobe inferiorly.   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  His aneurysm was first noted on CT scan in 2018.  It has been followed since then.  There is been no significant change over that time interval.  He needs continued annual follow-up.  He understands importance of blood pressure control.  Blood pressure is normal on low-dose Cozaar.  He is on a statin for hyperlipidemia.  Groundglass pulmonary nodule-noted on CT.  It is very difficult to definitively identify what the radiologist is referring to.  Unchanged compared to his CT a year ago.  Likely benign, but we will continue to follow.  Plan: Return in 1 year with CT angio of chest  Melrose Nakayama, MD [facility] [phone]

## 2020-01-12 DIAGNOSIS — K760 Fatty (change of) liver, not elsewhere classified: Secondary | ICD-10-CM | POA: Diagnosis not present

## 2020-01-30 ENCOUNTER — Other Ambulatory Visit: Payer: Self-pay

## 2020-01-30 ENCOUNTER — Ambulatory Visit (INDEPENDENT_AMBULATORY_CARE_PROVIDER_SITE_OTHER): Payer: Medicare Other | Admitting: *Deleted

## 2020-01-30 DIAGNOSIS — Z952 Presence of prosthetic heart valve: Secondary | ICD-10-CM

## 2020-01-30 DIAGNOSIS — Q211 Atrial septal defect: Secondary | ICD-10-CM | POA: Diagnosis not present

## 2020-01-30 DIAGNOSIS — Z7901 Long term (current) use of anticoagulants: Secondary | ICD-10-CM

## 2020-01-30 DIAGNOSIS — Q2112 Patent foramen ovale: Secondary | ICD-10-CM

## 2020-01-30 LAB — POCT INR: INR: 3 (ref 2.0–3.0)

## 2020-01-30 NOTE — Patient Instructions (Signed)
Description   Continue taking 1 tablet daily except 1/2 tablet on Saturdays.  Recheck INR in 5 weeks. Call [phone] for any medication changes or upcoming procedures.

## 2020-02-22 DIAGNOSIS — M542 Cervicalgia: Secondary | ICD-10-CM | POA: Diagnosis not present

## 2020-02-22 DIAGNOSIS — M546 Pain in thoracic spine: Secondary | ICD-10-CM | POA: Diagnosis not present

## 2020-02-22 DIAGNOSIS — M5030 Other cervical disc degeneration, unspecified cervical region: Secondary | ICD-10-CM | POA: Diagnosis not present

## 2020-02-26 ENCOUNTER — Other Ambulatory Visit: Payer: Self-pay | Admitting: Physician Assistant

## 2020-03-05 ENCOUNTER — Other Ambulatory Visit: Payer: Self-pay

## 2020-03-05 ENCOUNTER — Ambulatory Visit (INDEPENDENT_AMBULATORY_CARE_PROVIDER_SITE_OTHER): Payer: Medicare Other | Admitting: *Deleted

## 2020-03-05 DIAGNOSIS — Z952 Presence of prosthetic heart valve: Secondary | ICD-10-CM

## 2020-03-05 DIAGNOSIS — Z5181 Encounter for therapeutic drug level monitoring: Secondary | ICD-10-CM

## 2020-03-05 DIAGNOSIS — Q2112 Patent foramen ovale: Secondary | ICD-10-CM

## 2020-03-05 DIAGNOSIS — Z7901 Long term (current) use of anticoagulants: Secondary | ICD-10-CM | POA: Diagnosis not present

## 2020-03-05 DIAGNOSIS — Q211 Atrial septal defect: Secondary | ICD-10-CM | POA: Diagnosis not present

## 2020-03-05 LAB — POCT INR: INR: 2.2 (ref 2.0–3.0)

## 2020-03-05 MED ORDER — WARFARIN SODIUM 3 MG PO TABS: ORAL_TABLET | ORAL | 2 refills | Status: DC

## 2020-03-05 NOTE — Patient Instructions (Addendum)
Description   Take 1.5 tablets today and tomorrow then continue to take 1 tablet daily excpet for 1/2 a tablet on Saturdays. Recheck INR in 3 weeks.  Call [phone] for any medication changes or upcoming procedures.

## 2020-03-06 DIAGNOSIS — M47814 Spondylosis without myelopathy or radiculopathy, thoracic region: Secondary | ICD-10-CM | POA: Diagnosis not present

## 2020-03-06 DIAGNOSIS — G894 Chronic pain syndrome: Secondary | ICD-10-CM | POA: Diagnosis not present

## 2020-03-06 DIAGNOSIS — Z5181 Encounter for therapeutic drug level monitoring: Secondary | ICD-10-CM | POA: Diagnosis not present

## 2020-03-06 DIAGNOSIS — Z79899 Other long term (current) drug therapy: Secondary | ICD-10-CM | POA: Diagnosis not present

## 2020-03-11 DIAGNOSIS — M542 Cervicalgia: Secondary | ICD-10-CM | POA: Diagnosis not present

## 2020-03-11 DIAGNOSIS — M799 Soft tissue disorder, unspecified: Secondary | ICD-10-CM | POA: Diagnosis not present

## 2020-03-18 DIAGNOSIS — G894 Chronic pain syndrome: Secondary | ICD-10-CM | POA: Diagnosis not present

## 2020-03-26 ENCOUNTER — Ambulatory Visit (INDEPENDENT_AMBULATORY_CARE_PROVIDER_SITE_OTHER): Payer: Medicare Other | Admitting: *Deleted

## 2020-03-26 ENCOUNTER — Other Ambulatory Visit: Payer: Self-pay

## 2020-03-26 DIAGNOSIS — Q2112 Patent foramen ovale: Secondary | ICD-10-CM

## 2020-03-26 DIAGNOSIS — Q211 Atrial septal defect: Secondary | ICD-10-CM | POA: Diagnosis not present

## 2020-03-26 DIAGNOSIS — Z7901 Long term (current) use of anticoagulants: Secondary | ICD-10-CM | POA: Diagnosis not present

## 2020-03-26 DIAGNOSIS — Z952 Presence of prosthetic heart valve: Secondary | ICD-10-CM

## 2020-03-26 LAB — POCT INR: INR: 4 — AB (ref 2.0–3.0)

## 2020-03-26 NOTE — Patient Instructions (Signed)
Description   Today take 1/2 tablet then continue to take 1 tablet daily excpet for 1/2 tablet on Saturdays. Recheck INR in 3 weeks. Call [phone] for any medication changes or upcoming procedures.

## 2020-03-27 DIAGNOSIS — Z23 Encounter for immunization: Secondary | ICD-10-CM | POA: Diagnosis not present

## 2020-03-27 DIAGNOSIS — D509 Iron deficiency anemia, unspecified: Secondary | ICD-10-CM | POA: Diagnosis not present

## 2020-04-11 DIAGNOSIS — H2513 Age-related nuclear cataract, bilateral: Secondary | ICD-10-CM | POA: Diagnosis not present

## 2020-04-17 ENCOUNTER — Other Ambulatory Visit: Payer: Self-pay

## 2020-04-17 ENCOUNTER — Ambulatory Visit (INDEPENDENT_AMBULATORY_CARE_PROVIDER_SITE_OTHER): Payer: Medicare Other | Admitting: *Deleted

## 2020-04-17 DIAGNOSIS — Z7901 Long term (current) use of anticoagulants: Secondary | ICD-10-CM | POA: Diagnosis not present

## 2020-04-17 DIAGNOSIS — Q211 Atrial septal defect: Secondary | ICD-10-CM | POA: Diagnosis not present

## 2020-04-17 DIAGNOSIS — Z952 Presence of prosthetic heart valve: Secondary | ICD-10-CM | POA: Diagnosis not present

## 2020-04-17 DIAGNOSIS — Q2112 Patent foramen ovale: Secondary | ICD-10-CM

## 2020-04-17 LAB — POCT INR: INR: 3.9 — AB (ref 2.0–3.0)

## 2020-04-17 NOTE — Patient Instructions (Signed)
Description   Today take 1/2 tablet then start taking 1 tablet daily except for 1/2 tablet on Tuesdays and Saturdays. Recheck INR in 3 weeks. Call [phone] for any medication changes or upcoming procedures.

## 2020-05-06 ENCOUNTER — Other Ambulatory Visit: Payer: Self-pay

## 2020-05-06 ENCOUNTER — Ambulatory Visit (INDEPENDENT_AMBULATORY_CARE_PROVIDER_SITE_OTHER): Payer: Medicare Other | Admitting: *Deleted

## 2020-05-06 DIAGNOSIS — Z5181 Encounter for therapeutic drug level monitoring: Secondary | ICD-10-CM

## 2020-05-06 DIAGNOSIS — Q211 Atrial septal defect: Secondary | ICD-10-CM

## 2020-05-06 DIAGNOSIS — Q2112 Patent foramen ovale: Secondary | ICD-10-CM

## 2020-05-06 DIAGNOSIS — Z7901 Long term (current) use of anticoagulants: Secondary | ICD-10-CM

## 2020-05-06 DIAGNOSIS — Z952 Presence of prosthetic heart valve: Secondary | ICD-10-CM

## 2020-05-06 LAB — POCT INR: INR: 3.1 — AB (ref 2.0–3.0)

## 2020-05-06 NOTE — Patient Instructions (Addendum)
Description    Continue taking 1 tablet daily except for 1/2 tablet on Tuesdays and Saturdays. Recheck INR on 12/3- per pt request. Call [phone] for any medication changes or upcoming procedures.

## 2020-05-24 ENCOUNTER — Ambulatory Visit (INDEPENDENT_AMBULATORY_CARE_PROVIDER_SITE_OTHER): Payer: Medicare Other | Admitting: *Deleted

## 2020-05-24 ENCOUNTER — Other Ambulatory Visit: Payer: Self-pay

## 2020-05-24 DIAGNOSIS — Q211 Atrial septal defect: Secondary | ICD-10-CM | POA: Diagnosis not present

## 2020-05-24 DIAGNOSIS — Z952 Presence of prosthetic heart valve: Secondary | ICD-10-CM

## 2020-05-24 DIAGNOSIS — Z7901 Long term (current) use of anticoagulants: Secondary | ICD-10-CM

## 2020-05-24 DIAGNOSIS — Q2112 Patent foramen ovale: Secondary | ICD-10-CM

## 2020-05-24 LAB — POCT INR: INR: 3.1 — AB (ref 2.0–3.0)

## 2020-05-24 NOTE — Patient Instructions (Addendum)
Description    Continue taking 1 tablet daily except for 1/2 tablet on Tuesdays and Saturdays.  Recheck INR in 4 weeks. Call [phone] for any medication changes or upcoming procedures.

## 2020-05-27 DIAGNOSIS — Z9049 Acquired absence of other specified parts of digestive tract: Secondary | ICD-10-CM | POA: Diagnosis not present

## 2020-05-27 DIAGNOSIS — K219 Gastro-esophageal reflux disease without esophagitis: Secondary | ICD-10-CM | POA: Diagnosis not present

## 2020-05-27 DIAGNOSIS — I69319 Unspecified symptoms and signs involving cognitive functions following cerebral infarction: Secondary | ICD-10-CM | POA: Diagnosis not present

## 2020-05-27 DIAGNOSIS — I2510 Atherosclerotic heart disease of native coronary artery without angina pectoris: Secondary | ICD-10-CM | POA: Diagnosis not present

## 2020-05-27 DIAGNOSIS — Z85820 Personal history of malignant melanoma of skin: Secondary | ICD-10-CM | POA: Diagnosis not present

## 2020-05-27 DIAGNOSIS — G894 Chronic pain syndrome: Secondary | ICD-10-CM | POA: Diagnosis not present

## 2020-05-27 DIAGNOSIS — Z952 Presence of prosthetic heart valve: Secondary | ICD-10-CM | POA: Diagnosis not present

## 2020-05-27 DIAGNOSIS — M47814 Spondylosis without myelopathy or radiculopathy, thoracic region: Secondary | ICD-10-CM | POA: Diagnosis not present

## 2020-05-28 ENCOUNTER — Other Ambulatory Visit: Payer: Self-pay | Admitting: Cardiology

## 2020-06-03 DIAGNOSIS — C44519 Basal cell carcinoma of skin of other part of trunk: Secondary | ICD-10-CM | POA: Diagnosis not present

## 2020-06-03 DIAGNOSIS — D485 Neoplasm of uncertain behavior of skin: Secondary | ICD-10-CM | POA: Diagnosis not present

## 2020-06-03 DIAGNOSIS — L814 Other melanin hyperpigmentation: Secondary | ICD-10-CM | POA: Diagnosis not present

## 2020-06-03 DIAGNOSIS — L905 Scar conditions and fibrosis of skin: Secondary | ICD-10-CM | POA: Diagnosis not present

## 2020-06-03 DIAGNOSIS — Z85828 Personal history of other malignant neoplasm of skin: Secondary | ICD-10-CM | POA: Diagnosis not present

## 2020-06-03 DIAGNOSIS — L728 Other follicular cysts of the skin and subcutaneous tissue: Secondary | ICD-10-CM | POA: Diagnosis not present

## 2020-06-03 DIAGNOSIS — L820 Inflamed seborrheic keratosis: Secondary | ICD-10-CM | POA: Diagnosis not present

## 2020-06-10 DIAGNOSIS — Z85828 Personal history of other malignant neoplasm of skin: Secondary | ICD-10-CM | POA: Diagnosis not present

## 2020-06-10 DIAGNOSIS — Z8673 Personal history of transient ischemic attack (TIA), and cerebral infarction without residual deficits: Secondary | ICD-10-CM | POA: Diagnosis not present

## 2020-06-10 DIAGNOSIS — G894 Chronic pain syndrome: Secondary | ICD-10-CM | POA: Diagnosis not present

## 2020-06-10 DIAGNOSIS — I252 Old myocardial infarction: Secondary | ICD-10-CM | POA: Diagnosis not present

## 2020-06-10 DIAGNOSIS — Z85820 Personal history of malignant melanoma of skin: Secondary | ICD-10-CM | POA: Diagnosis not present

## 2020-06-10 DIAGNOSIS — I2510 Atherosclerotic heart disease of native coronary artery without angina pectoris: Secondary | ICD-10-CM | POA: Diagnosis not present

## 2020-06-10 DIAGNOSIS — M47814 Spondylosis without myelopathy or radiculopathy, thoracic region: Secondary | ICD-10-CM | POA: Diagnosis not present

## 2020-06-10 DIAGNOSIS — K219 Gastro-esophageal reflux disease without esophagitis: Secondary | ICD-10-CM | POA: Diagnosis not present

## 2020-06-25 ENCOUNTER — Other Ambulatory Visit: Payer: Self-pay

## 2020-06-25 ENCOUNTER — Ambulatory Visit (INDEPENDENT_AMBULATORY_CARE_PROVIDER_SITE_OTHER): Payer: Medicare Other | Admitting: *Deleted

## 2020-06-25 DIAGNOSIS — Q211 Atrial septal defect: Secondary | ICD-10-CM

## 2020-06-25 DIAGNOSIS — Z7901 Long term (current) use of anticoagulants: Secondary | ICD-10-CM | POA: Diagnosis not present

## 2020-06-25 DIAGNOSIS — Q2112 Patent foramen ovale: Secondary | ICD-10-CM

## 2020-06-25 DIAGNOSIS — Z952 Presence of prosthetic heart valve: Secondary | ICD-10-CM | POA: Diagnosis not present

## 2020-06-25 LAB — POCT INR: INR: 3.6 — AB (ref 2.0–3.0)

## 2020-06-25 NOTE — Patient Instructions (Signed)
Description   Tomorrow take 1/2 tablet then continue taking 1 tablet daily except for 1/2 tablet on Tuesdays and Saturdays.  Recheck INR in 4 weeks. Call [phone] for any medication changes or upcoming procedures.

## 2020-07-04 DIAGNOSIS — L905 Scar conditions and fibrosis of skin: Secondary | ICD-10-CM | POA: Diagnosis not present

## 2020-07-04 DIAGNOSIS — M546 Pain in thoracic spine: Secondary | ICD-10-CM | POA: Diagnosis not present

## 2020-07-04 DIAGNOSIS — G894 Chronic pain syndrome: Secondary | ICD-10-CM | POA: Diagnosis not present

## 2020-07-04 DIAGNOSIS — M47814 Spondylosis without myelopathy or radiculopathy, thoracic region: Secondary | ICD-10-CM | POA: Diagnosis not present

## 2020-07-04 DIAGNOSIS — C44519 Basal cell carcinoma of skin of other part of trunk: Secondary | ICD-10-CM | POA: Diagnosis not present

## 2020-07-04 DIAGNOSIS — G8929 Other chronic pain: Secondary | ICD-10-CM | POA: Diagnosis not present

## 2020-07-15 DIAGNOSIS — D6869 Other thrombophilia: Secondary | ICD-10-CM | POA: Diagnosis not present

## 2020-07-15 DIAGNOSIS — Z7901 Long term (current) use of anticoagulants: Secondary | ICD-10-CM | POA: Diagnosis not present

## 2020-07-15 DIAGNOSIS — I2510 Atherosclerotic heart disease of native coronary artery without angina pectoris: Secondary | ICD-10-CM | POA: Diagnosis not present

## 2020-07-15 DIAGNOSIS — Z952 Presence of prosthetic heart valve: Secondary | ICD-10-CM | POA: Diagnosis not present

## 2020-07-15 DIAGNOSIS — F419 Anxiety disorder, unspecified: Secondary | ICD-10-CM | POA: Diagnosis not present

## 2020-07-15 DIAGNOSIS — K219 Gastro-esophageal reflux disease without esophagitis: Secondary | ICD-10-CM | POA: Diagnosis not present

## 2020-07-15 DIAGNOSIS — E7800 Pure hypercholesterolemia, unspecified: Secondary | ICD-10-CM | POA: Diagnosis not present

## 2020-07-15 DIAGNOSIS — Z1211 Encounter for screening for malignant neoplasm of colon: Secondary | ICD-10-CM | POA: Diagnosis not present

## 2020-07-15 DIAGNOSIS — M109 Gout, unspecified: Secondary | ICD-10-CM | POA: Diagnosis not present

## 2020-07-15 DIAGNOSIS — I10 Essential (primary) hypertension: Secondary | ICD-10-CM | POA: Diagnosis not present

## 2020-07-16 DIAGNOSIS — Z1211 Encounter for screening for malignant neoplasm of colon: Secondary | ICD-10-CM | POA: Diagnosis not present

## 2020-07-23 ENCOUNTER — Ambulatory Visit (INDEPENDENT_AMBULATORY_CARE_PROVIDER_SITE_OTHER): Payer: Medicare Other | Admitting: *Deleted

## 2020-07-23 ENCOUNTER — Other Ambulatory Visit: Payer: Self-pay

## 2020-07-23 DIAGNOSIS — Z952 Presence of prosthetic heart valve: Secondary | ICD-10-CM | POA: Diagnosis not present

## 2020-07-23 DIAGNOSIS — Z7901 Long term (current) use of anticoagulants: Secondary | ICD-10-CM

## 2020-07-23 DIAGNOSIS — Q211 Atrial septal defect: Secondary | ICD-10-CM | POA: Diagnosis not present

## 2020-07-23 DIAGNOSIS — Q2112 Patent foramen ovale: Secondary | ICD-10-CM

## 2020-07-23 LAB — POCT INR: INR: 3.4 — AB (ref 2.0–3.0)

## 2020-07-23 NOTE — Patient Instructions (Signed)
Description   Continue taking Warfarin 1 tablet daily except for 1/2 tablet on Tuesdays and Saturdays.  Recheck INR in 4 weeks. Call [phone] for any medication changes or upcoming procedures.

## 2020-08-03 ENCOUNTER — Encounter: Payer: Self-pay | Admitting: Thoracic Surgery (Cardiothoracic Vascular Surgery)

## 2020-08-16 ENCOUNTER — Other Ambulatory Visit: Payer: Self-pay | Admitting: Physician Assistant

## 2020-08-20 ENCOUNTER — Other Ambulatory Visit: Payer: Self-pay | Admitting: Cardiology

## 2020-08-20 ENCOUNTER — Other Ambulatory Visit: Payer: Self-pay

## 2020-08-20 ENCOUNTER — Ambulatory Visit (INDEPENDENT_AMBULATORY_CARE_PROVIDER_SITE_OTHER): Payer: Medicare Other

## 2020-08-20 DIAGNOSIS — I639 Cerebral infarction, unspecified: Secondary | ICD-10-CM | POA: Diagnosis not present

## 2020-08-20 DIAGNOSIS — Z7901 Long term (current) use of anticoagulants: Secondary | ICD-10-CM

## 2020-08-20 DIAGNOSIS — Q211 Atrial septal defect: Secondary | ICD-10-CM | POA: Diagnosis not present

## 2020-08-20 DIAGNOSIS — Z952 Presence of prosthetic heart valve: Secondary | ICD-10-CM

## 2020-08-20 DIAGNOSIS — Q2112 Patent foramen ovale: Secondary | ICD-10-CM

## 2020-08-20 LAB — POCT INR: INR: 3.1 — AB (ref 2.0–3.0)

## 2020-08-20 NOTE — Patient Instructions (Signed)
Description   Continue on same dosage of Warfarin 1 tablet daily except for 1/2 tablet on Tuesdays and Saturdays.  Recheck INR in 4 weeks. Call [phone] for any medication changes or upcoming procedures.

## 2020-08-23 DIAGNOSIS — Z03818 Encounter for observation for suspected exposure to other biological agents ruled out: Secondary | ICD-10-CM | POA: Diagnosis not present

## 2020-08-23 DIAGNOSIS — Z20822 Contact with and (suspected) exposure to covid-19: Secondary | ICD-10-CM | POA: Diagnosis not present

## 2020-08-24 DIAGNOSIS — Z20822 Contact with and (suspected) exposure to covid-19: Secondary | ICD-10-CM | POA: Diagnosis not present

## 2020-08-25 DIAGNOSIS — Z03818 Encounter for observation for suspected exposure to other biological agents ruled out: Secondary | ICD-10-CM | POA: Diagnosis not present

## 2020-08-25 DIAGNOSIS — Z20822 Contact with and (suspected) exposure to covid-19: Secondary | ICD-10-CM | POA: Diagnosis not present

## 2020-09-18 ENCOUNTER — Other Ambulatory Visit: Payer: Self-pay

## 2020-09-18 ENCOUNTER — Ambulatory Visit (INDEPENDENT_AMBULATORY_CARE_PROVIDER_SITE_OTHER): Payer: Medicare Other | Admitting: *Deleted

## 2020-09-18 DIAGNOSIS — Z7901 Long term (current) use of anticoagulants: Secondary | ICD-10-CM

## 2020-09-18 DIAGNOSIS — Z952 Presence of prosthetic heart valve: Secondary | ICD-10-CM | POA: Diagnosis not present

## 2020-09-18 DIAGNOSIS — Z5181 Encounter for therapeutic drug level monitoring: Secondary | ICD-10-CM | POA: Diagnosis not present

## 2020-09-18 DIAGNOSIS — Q211 Atrial septal defect: Secondary | ICD-10-CM | POA: Diagnosis not present

## 2020-09-18 DIAGNOSIS — Q2112 Patent foramen ovale: Secondary | ICD-10-CM

## 2020-09-18 LAB — POCT INR: INR: 3.5 — AB (ref 2.0–3.0)

## 2020-09-18 NOTE — Patient Instructions (Addendum)
Description   Continue on same dosage of Warfarin 1 tablet daily except for 1/2 tablet on Tuesdays and Saturdays.  Recheck INR in 6 weeks. Call [phone] for any medication changes or upcoming procedures.

## 2020-09-30 DIAGNOSIS — Z23 Encounter for immunization: Secondary | ICD-10-CM | POA: Diagnosis not present

## 2020-10-03 NOTE — Telephone Encounter (Signed)
[name] records were faxed to [facility] this afternoon 10/03/20  [facility]

## 2020-10-07 DIAGNOSIS — Z8673 Personal history of transient ischemic attack (TIA), and cerebral infarction without residual deficits: Secondary | ICD-10-CM | POA: Diagnosis not present

## 2020-10-07 DIAGNOSIS — F411 Generalized anxiety disorder: Secondary | ICD-10-CM | POA: Diagnosis not present

## 2020-10-07 DIAGNOSIS — Z952 Presence of prosthetic heart valve: Secondary | ICD-10-CM | POA: Diagnosis not present

## 2020-10-07 DIAGNOSIS — R5383 Other fatigue: Secondary | ICD-10-CM | POA: Diagnosis not present

## 2020-10-07 DIAGNOSIS — K219 Gastro-esophageal reflux disease without esophagitis: Secondary | ICD-10-CM | POA: Diagnosis not present

## 2020-10-07 DIAGNOSIS — I729 Aneurysm of unspecified site: Secondary | ICD-10-CM | POA: Diagnosis not present

## 2020-10-07 DIAGNOSIS — M549 Dorsalgia, unspecified: Secondary | ICD-10-CM | POA: Diagnosis not present

## 2020-10-07 DIAGNOSIS — I252 Old myocardial infarction: Secondary | ICD-10-CM | POA: Diagnosis not present

## 2020-10-07 DIAGNOSIS — Z862 Personal history of diseases of the blood and blood-forming organs and certain disorders involving the immune mechanism: Secondary | ICD-10-CM | POA: Diagnosis not present

## 2020-10-07 DIAGNOSIS — Z7901 Long term (current) use of anticoagulants: Secondary | ICD-10-CM | POA: Diagnosis not present

## 2020-10-07 DIAGNOSIS — G2581 Restless legs syndrome: Secondary | ICD-10-CM | POA: Diagnosis not present

## 2020-10-07 DIAGNOSIS — E785 Hyperlipidemia, unspecified: Secondary | ICD-10-CM | POA: Diagnosis not present

## 2020-10-11 DIAGNOSIS — Z7901 Long term (current) use of anticoagulants: Secondary | ICD-10-CM | POA: Diagnosis not present

## 2020-10-17 DIAGNOSIS — I252 Old myocardial infarction: Secondary | ICD-10-CM | POA: Diagnosis not present

## 2020-10-17 DIAGNOSIS — I959 Hypotension, unspecified: Secondary | ICD-10-CM | POA: Diagnosis not present

## 2020-10-17 DIAGNOSIS — E785 Hyperlipidemia, unspecified: Secondary | ICD-10-CM | POA: Diagnosis not present

## 2020-10-17 DIAGNOSIS — K760 Fatty (change of) liver, not elsewhere classified: Secondary | ICD-10-CM | POA: Diagnosis not present

## 2020-10-17 DIAGNOSIS — I255 Ischemic cardiomyopathy: Secondary | ICD-10-CM | POA: Diagnosis not present

## 2020-10-17 DIAGNOSIS — Z952 Presence of prosthetic heart valve: Secondary | ICD-10-CM | POA: Diagnosis not present

## 2020-10-17 DIAGNOSIS — Z8774 Personal history of (corrected) congenital malformations of heart and circulatory system: Secondary | ICD-10-CM | POA: Diagnosis not present

## 2020-10-17 DIAGNOSIS — D6859 Other primary thrombophilia: Secondary | ICD-10-CM | POA: Diagnosis not present

## 2020-10-17 DIAGNOSIS — I712 Thoracic aortic aneurysm, without rupture: Secondary | ICD-10-CM | POA: Diagnosis not present

## 2020-10-17 DIAGNOSIS — I2510 Atherosclerotic heart disease of native coronary artery without angina pectoris: Secondary | ICD-10-CM | POA: Diagnosis not present

## 2020-10-29 DIAGNOSIS — Z7901 Long term (current) use of anticoagulants: Secondary | ICD-10-CM | POA: Diagnosis not present

## 2020-10-31 ENCOUNTER — Telehealth: Payer: Self-pay | Admitting: *Deleted

## 2020-10-31 NOTE — Telephone Encounter (Signed)
Pt in the [facility]mad[facility] follow up book- pt recently moved to [place] and was in the process of finding a new PCP and cardiologist.   Hulen Skains and spoke to pt who stated that he has found a PCP and cardiologist in [place]. Pt stated that the cardiologist is going to be managing his warfarin and he had his INR checked at his Primary care office on 5/10 and his new cardiologist will be able to see the results. Pt stated that he is in the process of getting a home machine to check his INR.   Closed pt's anti-coag enounter.

## 2020-11-02 ENCOUNTER — Other Ambulatory Visit: Payer: Self-pay | Admitting: Cardiology

## 2020-11-04 DIAGNOSIS — G894 Chronic pain syndrome: Secondary | ICD-10-CM | POA: Diagnosis not present

## 2020-11-04 DIAGNOSIS — M546 Pain in thoracic spine: Secondary | ICD-10-CM | POA: Diagnosis not present

## 2020-11-04 DIAGNOSIS — M47814 Spondylosis without myelopathy or radiculopathy, thoracic region: Secondary | ICD-10-CM | POA: Diagnosis not present

## 2020-11-04 DIAGNOSIS — G8929 Other chronic pain: Secondary | ICD-10-CM | POA: Diagnosis not present

## 2020-11-11 DIAGNOSIS — I517 Cardiomegaly: Secondary | ICD-10-CM | POA: Diagnosis not present

## 2020-11-11 DIAGNOSIS — I351 Nonrheumatic aortic (valve) insufficiency: Secondary | ICD-10-CM | POA: Diagnosis not present

## 2020-11-11 DIAGNOSIS — Q211 Atrial septal defect: Secondary | ICD-10-CM | POA: Diagnosis not present

## 2020-11-11 DIAGNOSIS — I447 Left bundle-branch block, unspecified: Secondary | ICD-10-CM | POA: Diagnosis not present

## 2020-11-11 DIAGNOSIS — I371 Nonrheumatic pulmonary valve insufficiency: Secondary | ICD-10-CM | POA: Diagnosis not present

## 2020-11-11 DIAGNOSIS — I2510 Atherosclerotic heart disease of native coronary artery without angina pectoris: Secondary | ICD-10-CM | POA: Diagnosis not present

## 2020-11-11 DIAGNOSIS — R001 Bradycardia, unspecified: Secondary | ICD-10-CM | POA: Diagnosis not present

## 2020-11-11 DIAGNOSIS — I340 Nonrheumatic mitral (valve) insufficiency: Secondary | ICD-10-CM | POA: Diagnosis not present

## 2020-11-11 DIAGNOSIS — I361 Nonrheumatic tricuspid (valve) insufficiency: Secondary | ICD-10-CM | POA: Diagnosis not present

## 2020-11-11 DIAGNOSIS — Z952 Presence of prosthetic heart valve: Secondary | ICD-10-CM | POA: Diagnosis not present

## 2020-11-13 ENCOUNTER — Other Ambulatory Visit: Payer: Self-pay | Admitting: Cardiology

## 2020-11-21 ENCOUNTER — Other Ambulatory Visit: Payer: Self-pay | Admitting: Cardiology

## 2020-11-27 ENCOUNTER — Other Ambulatory Visit: Payer: Self-pay | Admitting: Thoracic Surgery (Cardiothoracic Vascular Surgery)

## 2020-11-27 DIAGNOSIS — I712 Thoracic aortic aneurysm, without rupture, unspecified: Secondary | ICD-10-CM

## 2021-01-21 ENCOUNTER — Ambulatory Visit: Payer: Medicare Other | Admitting: Thoracic Surgery (Cardiothoracic Vascular Surgery)

## 2021-01-21 ENCOUNTER — Other Ambulatory Visit: Payer: Medicare Other
